# Patient Record
Sex: Male | Born: 1935 | Race: White | Hispanic: No | Marital: Married
Health system: Southern US, Community
[De-identification: ages and names within clinical notes are randomized; demographics above are authoritative.]

## PROBLEM LIST (undated history)

## (undated) ENCOUNTER — Ambulatory Visit: Admission: EM | Payer: Self-pay | Source: Home / Self Care

## (undated) DIAGNOSIS — R42 Dizziness and giddiness: Secondary | ICD-10-CM

## (undated) DIAGNOSIS — C801 Malignant (primary) neoplasm, unspecified: Secondary | ICD-10-CM

## (undated) DIAGNOSIS — I7300 Raynaud's syndrome without gangrene: Secondary | ICD-10-CM

## (undated) DIAGNOSIS — I639 Cerebral infarction, unspecified: Secondary | ICD-10-CM

## (undated) DIAGNOSIS — M79672 Pain in left foot: Secondary | ICD-10-CM

## (undated) DIAGNOSIS — I10 Essential (primary) hypertension: Secondary | ICD-10-CM

## (undated) DIAGNOSIS — N400 Enlarged prostate without lower urinary tract symptoms: Secondary | ICD-10-CM

## (undated) DIAGNOSIS — E7800 Pure hypercholesterolemia, unspecified: Secondary | ICD-10-CM

## (undated) DIAGNOSIS — R011 Cardiac murmur, unspecified: Secondary | ICD-10-CM

## (undated) DIAGNOSIS — M79671 Pain in right foot: Secondary | ICD-10-CM

## (undated) DIAGNOSIS — Z923 Personal history of irradiation: Secondary | ICD-10-CM

## (undated) DIAGNOSIS — J449 Chronic obstructive pulmonary disease, unspecified: Secondary | ICD-10-CM

## (undated) DIAGNOSIS — E559 Vitamin D deficiency, unspecified: Secondary | ICD-10-CM

## (undated) DIAGNOSIS — G250 Essential tremor: Secondary | ICD-10-CM

## (undated) DIAGNOSIS — E039 Hypothyroidism, unspecified: Secondary | ICD-10-CM

## (undated) DIAGNOSIS — I739 Peripheral vascular disease, unspecified: Secondary | ICD-10-CM

## (undated) DIAGNOSIS — Z8673 Personal history of transient ischemic attack (TIA), and cerebral infarction without residual deficits: Secondary | ICD-10-CM

## (undated) DIAGNOSIS — R296 Repeated falls: Secondary | ICD-10-CM

## (undated) DIAGNOSIS — K52831 Collagenous colitis: Secondary | ICD-10-CM

## (undated) HISTORY — PX: TONSILLECTOMY: SUR1361

## (undated) HISTORY — PX: ILIAC ARTERY STENT: SHX1786

## (undated) HISTORY — PX: VASECTOMY: SHX75

## (undated) HISTORY — PX: DIAGNOSTIC LAPAROSCOPY: SUR761

## (undated) HISTORY — PX: HERNIA REPAIR: SHX51

## (undated) HISTORY — PX: CHOLECYSTECTOMY: SHX55

---

## 1989-10-10 HISTORY — PX: EXPLORATORY LAPAROTOMY: SUR591

## 1994-08-12 ENCOUNTER — Ambulatory Visit: Admission: RE | Admit: 1994-08-12 | Payer: Self-pay | Source: Ambulatory Visit | Admitting: Gastroenterology

## 2000-05-26 ENCOUNTER — Ambulatory Visit
Admission: RE | Admit: 2000-05-26 | Disposition: A | Discharge status: Home / Self Care | Payer: Self-pay | Source: Ambulatory Visit | Admitting: Gastroenterology

## 2000-07-09 ENCOUNTER — Emergency Department
Admit: 2000-07-09 | Disposition: A | Discharge status: Home / Self Care | Payer: Self-pay | Admitting: Emergency Medicine

## 2004-12-14 ENCOUNTER — Emergency Department
Admission: EM | Admit: 2004-12-14 | Disposition: A | Discharge status: Home / Self Care | Payer: Self-pay | Source: Emergency Department | Admitting: Emergency Medicine

## 2004-12-16 ENCOUNTER — Ambulatory Visit
Admission: AD | Admit: 2004-12-16 | Disposition: A | Discharge status: Home / Self Care | Payer: Self-pay | Source: Ambulatory Visit | Admitting: Internal Medicine

## 2005-03-30 ENCOUNTER — Ambulatory Visit
Admission: RE | Admit: 2005-03-30 | Disposition: A | Discharge status: Home / Self Care | Payer: Self-pay | Source: Ambulatory Visit | Admitting: Gastroenterology

## 2005-04-25 ENCOUNTER — Ambulatory Visit
Admission: AD | Admit: 2005-04-25 | Disposition: A | Discharge status: Home / Self Care | Payer: Self-pay | Source: Ambulatory Visit | Admitting: Internal Medicine

## 2008-10-10 HISTORY — PX: STENT PLACEMENT ILIAC (ARMC HX): HXRAD1735

## 2008-10-14 ENCOUNTER — Ambulatory Visit
Admission: RE | Admit: 2008-10-14 | Disposition: A | Discharge status: Home / Self Care | Payer: Self-pay | Source: Ambulatory Visit | Admitting: Internal Medicine

## 2008-11-05 ENCOUNTER — Ambulatory Visit
Admission: RE | Admit: 2008-11-05 | Disposition: A | Discharge status: Home / Self Care | Payer: Self-pay | Source: Ambulatory Visit | Admitting: Internal Medicine

## 2008-12-05 ENCOUNTER — Observation Stay
Admission: RE | Admit: 2008-12-05 | Disposition: A | Discharge status: Home / Self Care | Payer: Self-pay | Source: Ambulatory Visit | Admitting: Body Imaging

## 2009-01-07 ENCOUNTER — Ambulatory Visit
Admission: RE | Admit: 2009-01-07 | Disposition: A | Discharge status: Home / Self Care | Payer: Self-pay | Source: Ambulatory Visit | Admitting: Body Imaging

## 2009-08-07 ENCOUNTER — Ambulatory Visit
Admission: RE | Admit: 2009-08-07 | Disposition: A | Discharge status: Home / Self Care | Payer: Self-pay | Source: Ambulatory Visit | Admitting: Body Imaging

## 2010-02-09 ENCOUNTER — Ambulatory Visit
Admission: RE | Admit: 2010-02-09 | Disposition: A | Discharge status: Home / Self Care | Payer: Self-pay | Source: Ambulatory Visit | Admitting: Gastroenterology

## 2010-08-17 ENCOUNTER — Ambulatory Visit
Admission: RE | Admit: 2010-08-17 | Disposition: A | Discharge status: Home / Self Care | Payer: Self-pay | Source: Ambulatory Visit | Admitting: Body Imaging

## 2011-07-13 NOTE — Consults (Signed)
TOWNES, FUHS                                                    MRN:          [id number]                                                          Account:      [id number]                                                     Document ID:  [id number]                                               Service Date: 11/05/2008                                                                                    MRN: [id number]  Admit Date: 11/05/2008     Patient Location: [facility]   Patient Type: O     CONSULTING PHYSICIAN: Katherina Right MUFTI MD     REFERRING PHYSICIAN:         REASON FOR CONSULTATION:   I had the pleasure of meeting your patient in the interventional radiology  office regarding peripheral vascular disease consultation.     HISTORY OF PRESENT ILLNESS:  This is a pleasant 77 year old male who complains of claudication at the  thigh and calf levels bilaterally which occurs at less than 15 minutes of  ambulation.  The patient recently underwent noninvasive arterial studies of  the lower extremities which demonstrated a resting ankle brachial index  0.94 on the right side and 0.62 on the left side.  Significant blunting  waveforms is noted bilaterally.  The iliac vessels were not well seen  initially, however inflow disease was suspected.     The patient returns today for repeat iliac imaging.  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  He moderate to  high-grade stenosis of the right common iliac artery is also noted.     The patient denies numbness or tingling in the lower extremities.  He does  not have rest pain.  He denies nonhealing ulcerations.     PAST MEDICAL HISTORY:  Prior tobacco use.  He quit 3 to 4 years ago.  He states his cholesterol is  well controlled.  He admits to hypertension which is well controlled with  antihypertensives and excessive stress.  There is no history of diabetes.     Other medical  history includes questionable history of heart murmur and  tremor in the hands along with BPH.     PAST SURGICAL HISTORY:  Internal hernia repair in 1991, cholecystectomy in 1992, and tonsillectomy  as a child.     CURRENT MEDICATIONS:                                                                                                           Page 1 of 3  HASHIR, DELEEUW                                                    MRN:          [id number]                                                          Account:      [id number]                                                     Document ID:  [id number]                                               Service Date: 11/05/2008                                                                                    Aspirin 81 mg every day, Diovan, hydrochlorothiazide 160 mg daily.   Atenolol 50 mg every day, primidone 50 mg every day, thyroid medicine and  multivitamins.     ALLERGIES:  No known drug allergies.     FAMILY HISTORY:  Obesity, dementia, emphysema and asthma.      SOCIAL HISTORY:  The patient has a 50-pack-year history of smoking.  He quit 3 to 4 years  ago.  Occasionally drinks alcohol.  He is married and works as a retired  Production designer, theatre/television/film.     REVIEW OF SYSTEMS:  Noncontributory other than urinary hesitancy, which may be due to BPH.     PHYSICAL  EXAMINATION:    VITAL SIGNS: 5 feet 9 inches at 200 pounds.  Vital signs are 136/70 in the  right arm and 138/84 in the left arm.  Heart rate is 68, respiratory rate  is 16.  GENERAL:  Appears well-developed, well nourished, in no acute distress or  pain.  HEENT:  Pupils are round and reactive.  Extraocular motion is normal.  NECK:  Supple, with no carotid bruit.  There is no lymphadenopathy.  CARDIOVASCULAR:  Regular rate and rhythm with no murmurs or gallops.  LUNGS:  Clear to auscultation with no rales, rhonchi, or wheezing.  ABDOMEN:  Soft, nontender, nondistended.  EXTREMITIES:  Demonstrate dopplerable pedal pulses  bilaterally which appear  biphasic.  Nonpalpable popliteal pulses are noted with decreased but  distant palpable pulses at the common femoral level especially at the left  side.  Minimal cyanosis noted at the toe tips bilaterally.  There is no  clubbing seen.  Hair and nail changes are appreciated minimally.     DIAGNOSTICS:  Arterial duplex and noninvasive performed on October 15, 2008.  Resting  ankle brachial index 2.94 on the right side and 0.62 on the left side.   Significant inflow disease suspected by duplex.  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     ASSESSMENT:  This is a pleasant 77 year old gentleman who complains of claudication at  less than 15 blocks of ambulation.  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  On the right side, mild to moderate grade  stenosis is suspected.     In the office today, we had a lengthy discussion regarding the  pathophysiology of arterial insufficiency, and peripheral vascular disease.   We discussed the risk factors associated with plaque formation as well as  the patient's specific findings.     Given the high-grade stenosis noted on the left side, I have recommended  revascularization of the iliac arteries bilaterally with  angioplasty and  stenting.  I notified the patient that this is a minimally invasive  procedure performed as an outpatient.  The risks, benefits, periprocedural  care and outcomes were discussed the patient thoroughly.     PLAN:  The patient states that he does understand that a critical stenosis is  present on the left side.  Furthermore, he understands the risk of left  iliac occlusion.  He would like to treat his claudication; however, his son  is currently ill and would like to provide care for him in the [place].   He states that he will be returning to the [place] area in the  upcoming weeks and will then decide on revascularization at that point.  I  have notified and further risks of leg ischemia and educated him.      OVERALL PLAN:  1.  Revascularization with angioplasty and stenting has been offered to the  patient at the iliac levels especially on the left side.  2.  The patient will let us know when he wishes to proceed.  3.  The patient was thoroughly educated on the peripheral vascular disease  and leg ischemia.     I thank you for the courtesy of your referral and the privilege of  participating in the care of your patient.  Should you have further  questions or concerns, please do not hesitate to call my office at  [phone].              D:  11/05/2008 16:53 PM by [name], MD ([id number])  T:  11/05/2008 21:50 PM by LS        cc:                                                                                                            Page 3 of 3  Authenticated by [name], MD On 11/10/2008 09:27:09 AM

## 2011-07-13 NOTE — Consults (Signed)
Zachary Coleman, Zachary Coleman                                                    MRN:          [id number]                                                          Account:      [id number]                                                     Document ID:  [id number]                                               Service Date: 08/17/2010                                                                                    MRN: [id number]  Document ID: [id number]  Admit Date: 08/17/2010     Patient Location: DISCHARGED 08/17/2010  Patient Type: O     CONSULTING PHYSICIAN: Katherina Right MUFTI MD     REFERRING PHYSICIAN:         HISTORY OF PRESENT ILLNESS:  I had the pleasure of following up with your patient in the interventional  radiology office.     As you may be aware, the patient is status post bilateral iliac artery  angioplasty and stenting performed in early 2010 for claudication purposes.     The patient returns to our office for routine followup at the 1-1/2 year  mark.     PHYSICAL EXAMINATION:   Today, he reports no resumption of claudication symptoms.  He is ambulating  well without difficulty.  In fact, he states that he has taken a part-time  job and is able to fulfill his role with walking and standing for the  majority of the day without any incidents.  He seems to be very happy  status post revascularization.     MEDICATIONS:   He continues to take his baby aspirin a day.  He is also on an  anticholesterol, specifically Lipitor, which is suiting him well.     PLAN:  Our plan will be to follow up with the patient in 1-1/2 years' time.   Repeat noninvasives will be performed as needed.  Incidentally, ABIs are  measured at greater than 1 on today's exam with wide patency of the iliac  stents.     I will keep you updated on the patient's progress as we see him  again in  1-1/2 years' time.     I thank you for the courtesy of your referral and the privilege of  participating in the care of your patient.  Should you have  further  questions or concerns, please do not hesitate to contact my office at                                                                                                           Page 1 of 2  Zachary Coleman, Zachary Coleman                                                    MRN:          [id number]                                                          Account:      [id number]                                                     Document ID:  [id number]                                               Service Date: 08/17/2010                                                                                    [phone].              D:  08/17/2010 14:58 PM by [name], MD ([id number])  T:  08/18/2010 08:25 AM by Netta Cedars      Everlean Cherry: [id number]) (Doc ID: [id number])        cc: Burman Blacksmith MD  Page 2 of 2  Authenticated by [name], MD On 08/19/2010 10:10:36 AM

## 2011-07-13 NOTE — Consults (Signed)
Zachary Coleman, Zachary Coleman                                                    MRN:          [id number]                                                          Account:      [id number]                                                     Document ID:  [id number]                                               Service Date: 08/07/2009                                                                                    MRN: [id number]  Admit Date: 11/05/2008     Patient Location: DISCHARGED 11/05/2008  Patient Type: O     CONSULTING PHYSICIAN: Katherina Right MUFTI MD     REFERRING PHYSICIAN: Samella Parr MD        HISTORY OF PRESENT ILLNESS:  I had the pleasure of following up with this patient  in the interventional  radiology office.     As you may be aware, the patient previously suffered from bilateral  claudication and hemodynamically significant iliacs stenoses.   Approximately 8 months ago, he was brought to [facility] with  angioplasty and stenting of the iliac arteries was performed bilaterally  with kissing balloon and stent technique.     The patient has done remarkably well post procedure.  His claudication  symptoms have completely resolved.  He states that he is able to walk up to  an hour without significant difficulty.     By objective measurements, ABI is measured at 0.99 bilaterally which is  significantly improved in comparison to preprocedure.  Arterial duplex  demonstrates patency of the stents with elevation of velocity seen at the  proximal right stent region.  However, given that this is a covered stent,  this likely represents artifact given that no stenosis is seen within.     The patient continues to take antiplatelets, and has been started on  Lipitor recently.  He states that he smokes only occasionally, [place]  [place].       PLAN:  Our plan will be to follow up with the patient in 1 year's time.  Repeat  noninvasives will  be performed as needed.     I am glad to see that he is  doing very well from a vascular standpoint.     Dr. Selena Batten, I thank you for the courtesy of your prior referral and the  privilege of participating in the care of your patient.  Should you have  further questions or concerns, please do not hesitate to contact my office  at [phone] or [phone].  The patient is planning to follow up with                                                                                                           Page 1 of 2  CHRSTOPHER, MALENFANT                                                    MRN:          [id number]                                                          Account:      [id number]                                                     Document ID:  [id number]                                               Service Date: 08/07/2009                                                                                    your office in a few weeks.              D:  08/07/2009 14:35 PM by [name], MD ([id number])  T:  08/07/2009 16:36 PM by MD        cc: Burman Blacksmith MD  Page 2 of 2  Authenticated by [name], MD On 08/10/2009 08:48:07 AM

## 2011-07-13 NOTE — Consults (Signed)
Zachary Coleman, Zachary Coleman                                                    MRN:          [id number]                                                          Account:      [id number]                                                     Document ID:  [id number]                                               Service Date: 01/07/2009                                                                                    MRN: [id number]  Admit Date: 01/07/2009     Patient Location: [facility]   Patient Type: O     CONSULTING PHYSICIAN: Katherina Right MUFTI MD     REFERRING PHYSICIAN:         HISTORY OF PRESENT ILLNESS:  I had the pleasure of following up with your patient in the interventional  radiology office.     As you may be aware, this is a 77 year old male who complained of bilateral  left greater than right claudication.  The left side was significant enough  that it was causing an alteration of his lifestyle.     Preprocedure ABIs on the left side measured 0.62.  The patient had a  high-grade critical stenosis of his left common iliac artery by duplex.     The patient was taken for angiography approximately 3 weeks ago, at which  point bilateral kissing iliac stent grafts were placed.  These stents were  placed just below the origin of the aorta.     The patient has done very well status post bilateral kissing stents.  He  currently states complete resolution of his claudication symptoms.  He does  have some fatigue noted in the calf levels bilaterally.  This only occurs  after significant walking.  Pulses are palpable bilaterally, which he did  not have previously.     Objectively, the patient's ABI on the left side in the office today  measures 0.88 whereas previously measured at 0.62.     The patient is currently taking Plavix, which is due to run-out in another  week.  I have asked him to discontinue Plavix  at that point, given that  covered stents are in place.  He will continue on aspirin alone afterwards.     The overall plan will  be to follow up with the patient in approximately 6  months time.  A baseline noninvasive exam of his arteries has been ordered  and the results are pending.     I have encouraged the patient to continue ambulating and walking.  This  should help his claudication symptoms as well as prevention of further.  I                                                                                                           Page 1 of 2  [name], [name]                                                    MRN:          [id number]                                                          Account:      [id number]                                                     Document ID:  [id number]                                               Service Date: 01/07/2009                                                                                    have also asked him to follow up with your office regarding routine  cholesterol screening, as well as surveillance of his   hypertension medications.     OVERALL PLAN::  Patient is doing well from a vascular standpoint.  Pulses are palpable.   ABIs significantly improved.  Claudication symptoms have resolved with the  patient complaining of calf fatigue with prolonged walking.  This should  improve as the patient's stamina improves with walking.     I  thank you for the courtesy of your prior referral and the privilege of  participating in the care of your patient.  Should you have further  questions or concerns, please do not hesitate to call my office at  [phone].              D:  01/07/2009 16:02 PM by [name], MD ([id number])  T:  01/07/2009 18:02 PM by MD        cc: Burman Blacksmith MD                                                                                                           Page 2 of 2  Authenticated by [name], MD On 01/20/2009 11:32:58 AM

## 2011-10-11 HISTORY — PX: TRANSURETHRAL RESECTION OF BLADDER TUMOR WITH GYRUS (TURBT-GYRUS): SHX6458

## 2013-10-10 DIAGNOSIS — Z8673 Personal history of transient ischemic attack (TIA), and cerebral infarction without residual deficits: Secondary | ICD-10-CM

## 2013-10-10 HISTORY — DX: Personal history of transient ischemic attack (TIA), and cerebral infarction without residual deficits: Z86.73

## 2013-11-21 ENCOUNTER — Ambulatory Visit: Payer: Self-pay

## 2013-11-21 ENCOUNTER — Other Ambulatory Visit
Admission: RE | Admit: 2013-11-21 | Discharge: 2013-11-21 | Disposition: A | Discharge status: Home / Self Care | Payer: Medicare Other | Source: Ambulatory Visit | Attending: Urology | Admitting: Urology

## 2013-11-21 DIAGNOSIS — C674 Malignant neoplasm of posterior wall of bladder: Secondary | ICD-10-CM

## 2014-01-03 ENCOUNTER — Ambulatory Visit: Payer: Self-pay | Admitting: Urology

## 2014-01-24 ENCOUNTER — Ambulatory Visit: Payer: Self-pay | Admitting: Physician Assistant

## 2014-01-28 ENCOUNTER — Ambulatory Visit: Payer: Self-pay | Admitting: Urology

## 2014-02-10 ENCOUNTER — Ambulatory Visit: Payer: Self-pay | Admitting: Urology

## 2014-02-13 LAB — PATHOLOGY REPORT

## 2014-06-17 ENCOUNTER — Ambulatory Visit: Payer: Self-pay

## 2014-06-17 ENCOUNTER — Emergency Department: Payer: Self-pay | Admitting: Emergency Medicine

## 2014-07-01 ENCOUNTER — Ambulatory Visit: Payer: Self-pay | Admitting: Family Medicine

## 2014-07-24 ENCOUNTER — Ambulatory Visit: Payer: Self-pay | Admitting: Family Medicine

## 2014-08-11 ENCOUNTER — Ambulatory Visit: Payer: Self-pay | Admitting: Specialist

## 2014-09-16 ENCOUNTER — Ambulatory Visit: Payer: Self-pay | Admitting: Neurology

## 2014-10-29 ENCOUNTER — Encounter: Payer: Self-pay | Admitting: Neurology

## 2014-11-10 ENCOUNTER — Encounter: Payer: Self-pay | Admitting: Neurology

## 2014-11-14 ENCOUNTER — Ambulatory Visit: Payer: Self-pay | Admitting: Urology

## 2014-11-14 LAB — CREATININE, SERUM
Creatinine: 1.27 mg/dL (ref 0.60–1.30)
EGFR (African American): >60
EGFR (Non-African Amer.): 58 — ABNORMAL LOW

## 2014-11-19 ENCOUNTER — Ambulatory Visit: Payer: Self-pay | Admitting: Gastroenterology

## 2014-12-02 ENCOUNTER — Ambulatory Visit: Payer: Self-pay | Admitting: Urology

## 2014-12-09 ENCOUNTER — Ambulatory Visit: Payer: Self-pay | Admitting: Urology

## 2014-12-09 ENCOUNTER — Encounter
Admit: 2014-12-09 | Disposition: A | Discharge status: Home / Self Care | Payer: Self-pay | Attending: Neurology | Admitting: Neurology

## 2015-01-19 ENCOUNTER — Ambulatory Visit
Admit: 2015-01-19 | Disposition: A | Discharge status: Home / Self Care | Payer: Self-pay | Attending: Gastroenterology | Admitting: Gastroenterology

## 2015-01-23 ENCOUNTER — Other Ambulatory Visit: Payer: Self-pay | Admitting: Specialist

## 2015-01-23 DIAGNOSIS — R69 Illness, unspecified: Secondary | ICD-10-CM

## 2015-01-31 NOTE — H&P (Signed)
PATIENT NAME:  Eric Torres, Eric Torres MR#:  [id number] DATE OF BIRTH:  18-Jan-1932  DATE OF ADMISSION:  02/10/2014  CHIEF COMPLAINT: History of bladder cancer with atypical urothelial cells present in urine cytology.  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 He received 6 weekly treatments of intravesical BCG. He had negative surveillance cystoscopy in January. He also had negative bladder biopsy at that time. Urine cytology and cystoscopy was performed April 2nd. Cystoscopy indicated a heavily trabeculated bladder but no obvious tumors. Urine cytology revealed atypia. The patient also has a long history of difficulty voiding. He has significant lower urinary tract symptoms in spite of finasteride 5 mg daily and Flomax 0.4 mg daily. He has a history of lateral lobe prostatic hypertrophy. Prostate ultrasound on April 2nd indicated a 25.1 gram prostate. PSA was 2.7 ng/mL on March 25th.   The patient comes in now for bladder biopsy and photovaporization of the prostate with green laser.   ALLERGIES: No drug allergies.   CURRENT MEDICATIONS: Include simvastatin, atenolol, primidone, levothyroxine, finasteride, tamsulosin, vitamin D, and aspirin.   PAST SURGICAL HISTORY: Tonsillectomy in 1938, vasectomy in 1971, exploratory laparotomy for volvulus with bowel obstruction in 1991, cholecystectomy in 1992, bilateral iliac stent placement in 2010, and transurethral resection of bladder cancer in 2013.   SOCIAL HISTORY: The patient quit smoking 2 years ago with a 19 pack-year history. He consumes 1 alcoholic beverage per week.  FAMILY HISTORY: The patient's father died at age 35 of asthma. Mother died of coronary artery disease at age 87.  PAST AND CURRENT MEDICAL CONDITIONS:  1.  Hypertension since 1974.  2.  Hypercholesterolemia since 2010. 3.  Hypothyroidism since 2010. 4.  Benign prostatic hypertrophy  with lower urinary tract symptoms since 2011.  5.  Benign essential tremor since 2011. 6.  History of superficial bladder cancer and carcinoma in situ since 2013.   REVIEW OF SYSTEMS: The patient has chronic constipation. Denied chest pain, shortness of breath, diabetes, or stroke.   PHYSICAL EXAMINATION: GENERAL: Well-nourished white male in no distress.  HEENT: Sclerae were clear. Pupils were equally round and reactive to light and accommodation. Extraocular movements were intact.  NECK: Supple. No palpable cervical adenopathy. No audible carotid bruits.  LUNGS: Clear to auscultation.  CARDIOVASCULAR: Regular rhythm and rate without audible murmurs.  ABDOMEN: Soft, nontender. GENITOURINARY: Uncircumcised with buried penis. Testes are smooth and nontender, 25 mL in size each.  RECTAL: Internal hemorrhoids. Prostate gland 45 grams smooth and nontender.  NEUROMUSCULAR: Alert and oriented x3.   IMPRESSION:  1.  History of carcinoma in situ of the bladder with atypia on cytology.  2.  Benign prostatic hypertrophy with bladder outlet obstruction.   PLAN:  1.  Cystoscopy with bladder biopsy. 2.  Photovaporization of the prostate with green light laser. ____________________________ Otelia Limes. Yves Dill, MD mrw:sb D: 01/30/2014 15:46:10 ET T: 01/30/2014 16:45:23 ET JOB#: [id number]  cc: Otelia Limes. Yves Dill, MD, <Dictator> Royston Cowper MD ELECTRONICALLY SIGNED 02/04/2014 8:24

## 2015-01-31 NOTE — Op Note (Signed)
PATIENT NAME:  Eric Torres, Eric Torres MR#:  [id number] DATE OF BIRTH:  16-Nov-1931  DATE OF PROCEDURE:  02/10/2014  PREOPERATIVE DIAGNOSES:  1. Benign prostatic hypertrophy with bladder outlet obstruction.  2. History of bladder cancer with atypical urine cytology.   POSTOPERATIVE DIAGNOSES:  1. Benign prostatic hypertrophy with bladder outlet obstruction.  2. History of bladder cancer with atypical urine cytology.   PROCEDURES:  1. Photovaporization of prostate with a GreenLight laser. 2. Bladder biopsy.   SURGEON: Maryan Puls, MD  ANESTHETIST: Dr. [place] Service.  ANESTHETIC METHOD: General per Boston Service and local per Yves Dill.   INDICATIONS: See the dictated history and physical. After informed consent, the patient requests the above procedures.   OPERATIVE SUMMARY: After adequate general anesthesia had been obtained, the patient was placed into dorsal lithotomy position and the perineum was prepped and draped in the usual fashion. The 21 French cystoscope was coupled with the camera and then visually advanced into the bladder. The bladder was heavily trabeculated with cellules present. No bladder mucosal lesions were identified. Both ureteral orifices were identified and had clear efflux. The patient had trilobar BPH with visual obstruction. At this point, the cold cup biopsy forceps were introduced through the cystoscope and biopsies were taken from the dome, posterior wall, and both lateral walls and submitted to pathology. Biopsy sites were cauterized with the Bugbee electrode. The cystoscope was then removed. The laser scope was then coupled with the camera and then visually advanced into the bladder. The GreenLight XPS laser fiber was introduced through the scope and vaporization of the bladder neck was performed at a setting of 80 watts. After the bladder neck tissue was vaporized, the power was increased up to 120 watts and remaining obstructive tissue from the bladder neck to the  verumontanum was vaporized. At this point, the scope was removed. Ten milliliters of viscous Xylocaine was instilled within the urethra and the bladder. A 20 French silicone catheter was placed. The catheter was irrigated until clear. A B and O suppository was placed. The procedure was then terminated and the patient was transferred to the recovery room in stable condition.    ____________________________ Otelia Limes. Yves Dill, MD mrw:lt D: 02/10/2014 16:14:39 ET T: 02/11/2014 02:05:35 ET JOB#: [id number]  cc: Otelia Limes. Yves Dill, MD, <Dictator> Royston Cowper MD ELECTRONICALLY SIGNED 02/11/2014 9:09

## 2015-02-02 LAB — SURGICAL PATHOLOGY

## 2015-02-08 NOTE — H&P (Signed)
PATIENT NAME:  Eric Torres MR#:  [id number] DATE OF BIRTH:  1931/11/09  DATE OF ADMISSION:  12/09/2014  CHIEF COMPLAINT: Bladder cancer.   HISTORY OF PRESENT ILLNESS: Mr. 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 He underwent surveillance cystoscopy, January 25, which was negative; however, cytology was positive. This was evaluated with an abdominal pelvic CT scan with and without contrast, and no lesions were identified. He comes in now for cystoscopy with bilateral retrogrades and bladder biopsy.   ALLERGIES: No drug allergies.   CURRENT MEDICATIONS: Include simvastatin, atenolol, primidone, levothyroxine, finasteride, tamsulosin, vitamin D, and aspirin.   PREVIOUS SURGICAL PROCEDURES: Include tonsillectomy in 1938, vasectomy in 1971, exploratory laparotomy for volvulus and bowel obstruction in 1991, cholecystectomy in 1992, bilateral iliac stent placement in 2010, and transurethral resection of bladder cancer in 2013.   SOCIAL HISTORY: The patient quit smoking in 2013, with a 45-pack-year history. He consumes 1 alcoholic beverage per week.   FAMILY HISTORY: Father died at age 57 of asthma. Mother died of coronary artery disease at age 36.   PAST AND CURRENT MEDICAL CONDITIONS: 1. Hypertension since 1974.  2. Hypercholesterolemia since 2010.  3. Hypothyroidism since 2010.  4. Benign prostatic hypertrophy with lower urinary tract symptoms since 2011.  5. Benign essential tremor since 2011.  6. History of superficial bladder cancer and carcinoma in situ since 2013.   REVIEW OF SYSTEMS: The patient has chronic constipation. He denied chest pain, shortness of breath, diabetes, or stroke.   PHYSICAL EXAMINATION:  GENERAL: A well-nourished, white male, in no acute distress.  HEENT: Sclerae were clear. Pupils were equally round, reactive to light and  accommodation. Extraocular movements were intact.  NECK: Supple. No palpable cervical adenopathy. No audible carotid bruits.  LUNGS: Clear to auscultation.   CARDIOVASCULAR: Regular rhythm and rate without audible murmurs.  ABDOMEN: Soft, nontender abdomen.  GENITOURINARY: Uncircumcised with buried penis. Testes were smooth and nontender, 25 mL in size, each.  RECTAL: Internal hemorrhoids. Prostate gland 45 grams, smooth and nontender.  NEUROMUSCULAR: Grossly intact.   IMPRESSION:  1. Positive urine cytology.  2. History of carcinoma in situ and bladder cancer.   PLAN: Cystoscopy with bilateral retrogrades and bladder biopsy.   ____________________________ Eric Torres. Eric Dill, MD mrw:JT D: 12/02/2014 12:01:46 ET T: 12/02/2014 12:24:31 ET JOB#: [id number]  cc: Eric Torres. Eric Dill, MD, <Dictator> Royston Cowper MD ELECTRONICALLY SIGNED 12/02/2014 15:45

## 2015-02-08 NOTE — Op Note (Signed)
PATIENT NAME:  Eric Torres, Eric Torres MR#:  [id number] DATE OF BIRTH:  [birth year]  DATE OF PROCEDURE:  12/09/2014  PREOPERATIVE DIAGNOSIS:  Positive urine cytology and history of carcinoma in situ of the bladder.   POSTOPERATIVE DIAGNOSIS:  Positive urine cytology and history of carcinoma in situ of the bladder.   PROCEDURE:  1.  Cystoscopy with bilateral retrogrades.  2.  Bladder biopsy.  3.  Fluoroscopy.   SURGEON: Otelia Limes. Yves Dill, MD  ANESTHETIST:  Alvin Critchley, MD  ANESTHETIC METHOD: General.   INDICATIONS: See the dictated history and physical. After informed consent, the patient requests the above procedure.   OPERATIVE SUMMARY: After adequate general anesthesia had been obtained, the patient was placed into dorsal lithotomy position and the perineum was prepped and draped in the usual fashion. The 21 French cystoscope was coupled with the camera and then visually advanced into the bladder. The patient had TURP defect. The bladder was heavily trabeculated and bladder neck was fibrotic but patent.  Inspection of the bladder revealed one 2 mm erythematous area on the posterior bladder wall. No other lesions were identified. There were numerous cellules present and small diverticula. Ureteral orifices could not be identified due to significant scarring of the trigone. An ampule of methylene blue was given by the anesthetist. After approximately 5 minutes, blue efflux was seen from the trigone and the orifices were identified. Bilateral retrograde pyelograms were performed using contrast through an 8 [place] cone-tipped ureteral catheter. Static fluoroscopic and post drain films did not reveal any filling defects.  At this point, the cold cup biopsy forceps was delivered through the scope and the erythematous lesion on the posterior lateral wall was biopsied. After the specimen was submitted to pathology, the Bugbee electrode was used to cauterize the base of this biopsy area. At this point, the  bladder was drained and the cystoscope was removed; 10 mL of viscous Xylocaine was instilled within the urethra and the bladder. A B and O suppository was placed. The procedure was then terminated and the patient was transferred to the recovery room in stable condition.   ____________________________ Otelia Limes. Yves Dill, MD mrw:LT D: 12/09/2014 15:24:47 ET T: 12/09/2014 16:36:03 ET JOB#: [id number]  cc: Otelia Limes. Yves Dill, MD, <Dictator> Royston Cowper MD ELECTRONICALLY SIGNED 12/11/2014 9:51

## 2015-05-25 ENCOUNTER — Other Ambulatory Visit: Payer: Self-pay | Admitting: Urology

## 2015-05-25 DIAGNOSIS — C674 Malignant neoplasm of posterior wall of bladder: Secondary | ICD-10-CM

## 2015-05-25 DIAGNOSIS — C61 Malignant neoplasm of prostate: Secondary | ICD-10-CM

## 2015-05-29 ENCOUNTER — Encounter
Admission: RE | Admit: 2015-05-29 | Discharge: 2015-05-29 | Disposition: A | Discharge status: Home / Self Care | Payer: Medicare Other | Source: Ambulatory Visit | Attending: Urology | Admitting: Urology

## 2015-05-29 DIAGNOSIS — C61 Malignant neoplasm of prostate: Secondary | ICD-10-CM | POA: Insufficient documentation

## 2015-05-29 DIAGNOSIS — C674 Malignant neoplasm of posterior wall of bladder: Secondary | ICD-10-CM | POA: Diagnosis present

## 2015-05-29 MED ORDER — TECHNETIUM TC 99M MEDRONATE IV KIT
23.3600 | PACK | Freq: Once | INTRAVENOUS | Status: AC | PRN
  Administered 2015-05-29: 23.36 via INTRAVENOUS

## 2015-06-01 ENCOUNTER — Ambulatory Visit
Admission: RE | Admit: 2015-06-01 | Discharge: 2015-06-01 | Disposition: A | Discharge status: Home / Self Care | Payer: Medicare Other | Source: Ambulatory Visit | Attending: Urology | Admitting: Urology

## 2015-06-01 DIAGNOSIS — C61 Malignant neoplasm of prostate: Secondary | ICD-10-CM

## 2015-06-01 DIAGNOSIS — K4020 Bilateral inguinal hernia, without obstruction or gangrene, not specified as recurrent: Secondary | ICD-10-CM | POA: Insufficient documentation

## 2015-06-01 DIAGNOSIS — Z8551 Personal history of malignant neoplasm of bladder: Secondary | ICD-10-CM | POA: Insufficient documentation

## 2015-06-01 DIAGNOSIS — Z8546 Personal history of malignant neoplasm of prostate: Secondary | ICD-10-CM | POA: Diagnosis present

## 2015-06-01 DIAGNOSIS — C674 Malignant neoplasm of posterior wall of bladder: Secondary | ICD-10-CM

## 2015-06-01 HISTORY — DX: Malignant (primary) neoplasm, unspecified: C80.1

## 2015-06-01 HISTORY — DX: Essential (primary) hypertension: I10

## 2015-06-01 MED ORDER — IOHEXOL 300 MG/ML  SOLN
125.0000 mL | Freq: Once | INTRAMUSCULAR | Status: AC | PRN
  Administered 2015-06-01: 125 mL via INTRAVENOUS

## 2015-08-03 ENCOUNTER — Other Ambulatory Visit: Payer: Self-pay | Admitting: Specialist

## 2015-08-03 DIAGNOSIS — R911 Solitary pulmonary nodule: Secondary | ICD-10-CM

## 2015-08-07 ENCOUNTER — Ambulatory Visit
Admission: RE | Admit: 2015-08-07 | Discharge: 2015-08-07 | Disposition: A | Discharge status: Home / Self Care | Payer: Medicare Other | Source: Ambulatory Visit | Attending: Specialist | Admitting: Specialist

## 2015-08-07 DIAGNOSIS — I7090 Unspecified atherosclerosis: Secondary | ICD-10-CM | POA: Diagnosis not present

## 2015-08-07 DIAGNOSIS — Z09 Encounter for follow-up examination after completed treatment for conditions other than malignant neoplasm: Secondary | ICD-10-CM | POA: Diagnosis present

## 2015-08-07 DIAGNOSIS — J439 Emphysema, unspecified: Secondary | ICD-10-CM | POA: Insufficient documentation

## 2015-08-07 DIAGNOSIS — R911 Solitary pulmonary nodule: Secondary | ICD-10-CM | POA: Insufficient documentation

## 2015-12-23 ENCOUNTER — Inpatient Hospital Stay: Admission: RE | Admit: 2015-12-23 | Payer: Medicare Other | Source: Ambulatory Visit

## 2015-12-23 ENCOUNTER — Encounter: Payer: Self-pay | Admitting: *Deleted

## 2015-12-23 NOTE — Patient Instructions (Signed)
  Your procedure is scheduled on: 12-29-15 (TUESDAY) Report to [place]. To find out your arrival time please call [phone] between 1PM - 3PM on 12-28-15 [facility])  Remember: Instructions that are not followed completely may result in serious medical risk, up to and including death, or upon the discretion of your surgeon and anesthesiologist your surgery may need to be rescheduled.    _X___ 1. Do not eat food or drink liquids after midnight. No gum chewing or hard candies.     _X___ 2. No Alcohol for 24 hours before or after surgery.   ____ 3. Bring all medications with you on the day of surgery if instructed.    _X___ 4. Notify your doctor if there is any change in your medical condition     (cold, fever, infections).     Do not wear jewelry, make-up, hairpins, clips or nail polish.  Do not wear lotions, powders, or perfumes. You may wear deodorant.  Do not shave 48 hours prior to surgery. Men may shave face and neck.  Do not bring valuables to the hospital.    [facility] is not responsible for any belongings or valuables.               Contacts, dentures or bridgework may not be worn into surgery.  Leave your suitcase in the car. After surgery it may be brought to your room.  For patients admitted to the hospital, discharge time is determined by your treatment team.   Patients discharged the day of surgery will not be allowed to drive home.   Please read over the following fact sheets that you were given:      _X___ Take these medicines the morning of surgery with A SIP OF WATER:    1. ATENOLOL  2. PRIMIDONE  3. GABAPENTIN  4.  5.  6.  ____ Fleet Enema (as directed)   ____ Use CHG Soap as directed  ____ Use inhalers on the day of surgery  ____ Stop metformin 2 days prior to surgery    ____ Take 1/2 of usual insulin dose the night before surgery and none on the morning of surgery.   ____ Stop Coumadin/Plavix/aspirin-PT STOPPED  ASPIRIN ON 12-22-15   _X___ Stop Anti-inflammatories-STOP MELOXICAM NOW-NO NSAIDS OR ASPIRIN PRODUCTS-TYLENOL OK TO TAKE   ____ Stop supplements until after surgery.    ____ Bring C-Pap to the hospital.

## 2015-12-23 NOTE — H&P (Signed)
NAME:  Eric Torres, Eric Torres NO.:  [id number]  MEDICAL RECORD NO.:  [id number]  LOCATION:                                 FACILITY:  PHYSICIAN:  Maryan Puls          DATE OF BIRTH:  1932/01/30  DATE OF ADMISSION:  12/29/2015 DATE OF DISCHARGE:                            HISTORY AND PHYSICAL   SAME-DAY SURGERY:  December 29, 2015.  CHIEF COMPLAINT:  Possible bladder cancer.  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  Recently, he has had positive urine cytologies and on cystoscopy December 18, 2015, had an erythematous area in the posterior bladder wall consistent with possible CIS.  CT evaluation indicated no tumor in the upper urinary system.  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  The patient comes in now for cystoscopy with bladder biopsy and possible bladder tumor resection.  ALLERGIES:  NO DRUG ALLERGIES.  CURRENT MEDICATIONS: 1. Atorvastatin. 2. Atenolol. 3. Primidone. 4. Levothyroxine. 5. Gabapentin. 6. Tamsulosin. 7. Aspirin. 8. Vitamin D.  PREVIOUS SURGICAL PROCEDURES: 1. Tonsillectomy in 1938. 2. Vasectomy in [place]. 3. Exploratory laparotomy for volvulus and bowel obstruction in 1991. 4. Cholecystectomy in 1992. 5. Bilateral iliac stent placement in 2010. 6. Transurethral resection of bladder tumor in 2013.  SOCIAL HISTORY:  The patient quit smoking in 2013 with a 45-pack year history.  He consumes 1 alcoholic beverage per week.  FAMILY HISTORY:  Father died at age 24 of asthma.  Mother died of coronary artery disease at age 9.  PAST AND CURRENT MEDICAL CONDITIONS: 1. Hypertension since 1974. 2. Hypercholesterolemia since 2010. 3. Hypothyroidism since 2010. 4. BPH with lower  urinary tract symptoms since 2011. 5. Benign essential tremor since 2011. 6. History of superficial bladder cancer and CIS since 2013. 7. History of bladder cancer since 2016, status post I-125     brachytherapy.  REVIEW OF SYSTEMS:  Patient has chronic constipation.  Denied chest pain, shortness of breath, diabetes, or stroke.  PHYSICAL EXAMINATION:  GENERAL:  Well-nourished white male, in no acute distress. HEENT:  Sclerae were clear.  Pupils were equally round, reactive to light and accommodation.  Extraocular movements were intact. NECK:  Supple.  No palpable cervical adenopathy. LUNGS:  Clear to auscultation. CARDIOVASCULAR:  Regular rhythm and rate without audible murmurs. ABDOMEN:  Soft and nontender abdomen. GU:  Uncircumcised with buried penis.  Testes were smooth and nontender. RECTAL:  Internal hemorrhoids.  Prostate gland was flat, smooth, nontender, approximately 35 mL in size.  NEUROMUSCULAR:  Alert and oriented x3.  IMPRESSION: 1. Positive urine cytology with history of CIS. 2. Abnormal region of posterior bladder wall on cystoscopy.  PLAN:  Cystoscopy, bladder biopsy, and possible bladder tumor resection.    ______________________________ Maryan Puls MD     MW/MEDQ  D:  12/23/2015  T:  12/23/2015  Job:  [id number]

## 2015-12-24 ENCOUNTER — Encounter
Admission: RE | Admit: 2015-12-24 | Discharge: 2015-12-24 | Disposition: A | Discharge status: Home / Self Care | Payer: Medicare Other | Source: Ambulatory Visit | Attending: Urology | Admitting: Urology

## 2015-12-24 DIAGNOSIS — C679 Malignant neoplasm of bladder, unspecified: Secondary | ICD-10-CM | POA: Insufficient documentation

## 2015-12-24 DIAGNOSIS — Z01812 Encounter for preprocedural laboratory examination: Secondary | ICD-10-CM | POA: Diagnosis present

## 2015-12-24 LAB — CBC
HCT: 44.2 % (ref 40.0–52.0)
HEMOGLOBIN: 15.2 g/dL (ref 13.0–18.0)
MCH: 33.2 pg (ref 26.0–34.0)
MCHC: 34.3 g/dL (ref 32.0–36.0)
MCV: 96.9 fL (ref 80.0–100.0)
PLATELETS: 204 10*3/uL (ref 150–440)
RBC: 4.56 MIL/uL (ref 4.40–5.90)
RDW: 13.8 % (ref 11.5–14.5)
WBC: 10.9 10*3/uL — ABNORMAL HIGH (ref 3.8–10.6)

## 2015-12-24 LAB — COMPREHENSIVE METABOLIC PANEL
ALBUMIN: 3.3 g/dL — AB (ref 3.5–5.0)
ALK PHOS: 77 U/L (ref 38–126)
ALT: 35 U/L (ref 17–63)
ANION GAP: 8 (ref 5–15)
AST: 41 U/L (ref 15–41)
BUN: 23 mg/dL — ABNORMAL HIGH (ref 6–20)
CALCIUM: 8.5 mg/dL — AB (ref 8.9–10.3)
CHLORIDE: 100 mmol/L — AB (ref 101–111)
CO2: 27 mmol/L (ref 22–32)
CREATININE: 1.26 mg/dL — AB (ref 0.61–1.24)
GFR calc non Af Amer: 52 mL/min — ABNORMAL LOW (ref >60)
GFR, EST AFRICAN AMERICAN: 60 mL/min — AB (ref >60)
GLUCOSE: 70 mg/dL (ref 65–99)
Potassium: 4.3 mmol/L (ref 3.5–5.1)
SODIUM: 135 mmol/L (ref 135–145)
Total Bilirubin: 0.7 mg/dL (ref 0.3–1.2)
Total Protein: 6.6 g/dL (ref 6.5–8.1)

## 2015-12-29 ENCOUNTER — Encounter: Payer: Self-pay | Admitting: *Deleted

## 2015-12-29 ENCOUNTER — Encounter
Admission: RE | Disposition: A | Discharge status: Home / Self Care | Payer: Self-pay | Source: Ambulatory Visit | Attending: Urology

## 2015-12-29 ENCOUNTER — Ambulatory Visit: Payer: Medicare Other | Admitting: Anesthesiology

## 2015-12-29 ENCOUNTER — Ambulatory Visit
Admission: RE | Admit: 2015-12-29 | Discharge: 2015-12-29 | Disposition: A | Discharge status: Home / Self Care | Payer: Medicare Other | Source: Ambulatory Visit | Attending: Urology | Admitting: Urology

## 2015-12-29 DIAGNOSIS — Z79899 Other long term (current) drug therapy: Secondary | ICD-10-CM | POA: Diagnosis not present

## 2015-12-29 DIAGNOSIS — I10 Essential (primary) hypertension: Secondary | ICD-10-CM | POA: Diagnosis not present

## 2015-12-29 DIAGNOSIS — Z8551 Personal history of malignant neoplasm of bladder: Secondary | ICD-10-CM | POA: Diagnosis not present

## 2015-12-29 DIAGNOSIS — N400 Enlarged prostate without lower urinary tract symptoms: Secondary | ICD-10-CM | POA: Diagnosis not present

## 2015-12-29 DIAGNOSIS — Z87891 Personal history of nicotine dependence: Secondary | ICD-10-CM | POA: Insufficient documentation

## 2015-12-29 DIAGNOSIS — E039 Hypothyroidism, unspecified: Secondary | ICD-10-CM | POA: Insufficient documentation

## 2015-12-29 DIAGNOSIS — D090 Carcinoma in situ of bladder: Secondary | ICD-10-CM | POA: Diagnosis not present

## 2015-12-29 DIAGNOSIS — Z8546 Personal history of malignant neoplasm of prostate: Secondary | ICD-10-CM | POA: Insufficient documentation

## 2015-12-29 DIAGNOSIS — Z7982 Long term (current) use of aspirin: Secondary | ICD-10-CM | POA: Diagnosis not present

## 2015-12-29 DIAGNOSIS — R896 Abnormal cytological findings in specimens from other organs, systems and tissues: Secondary | ICD-10-CM | POA: Diagnosis present

## 2015-12-29 DIAGNOSIS — J449 Chronic obstructive pulmonary disease, unspecified: Secondary | ICD-10-CM | POA: Insufficient documentation

## 2015-12-29 HISTORY — DX: Hypothyroidism, unspecified: E03.9

## 2015-12-29 HISTORY — DX: Essential tremor: G25.0

## 2015-12-29 HISTORY — PX: TRANSURETHRAL RESECTION OF BLADDER TUMOR: SHX2575

## 2015-12-29 HISTORY — DX: Personal history of irradiation: Z92.3

## 2015-12-29 HISTORY — PX: CYSTOSCOPY WITH BIOPSY: SHX5122

## 2015-12-29 HISTORY — DX: Benign prostatic hyperplasia without lower urinary tract symptoms: N40.0

## 2015-12-29 HISTORY — DX: Cardiac murmur, unspecified: R01.1

## 2015-12-29 HISTORY — DX: Pure hypercholesterolemia, unspecified: E78.00

## 2015-12-29 HISTORY — DX: Chronic obstructive pulmonary disease, unspecified: J44.9

## 2015-12-29 HISTORY — DX: Personal history of transient ischemic attack (TIA), and cerebral infarction without residual deficits: Z86.73

## 2015-12-29 SURGERY — CYSTOSCOPY, WITH BIOPSY
Anesthesia: General | Wound class: Clean Contaminated

## 2015-12-29 MED ORDER — LIDOCAINE HCL 2 % EX GEL
CUTANEOUS | Status: AC
  Filled 2015-12-29: qty 10

## 2015-12-29 MED ORDER — FENTANYL CITRATE (PF) 100 MCG/2ML IJ SOLN: 25.0000 ug | INTRAMUSCULAR | Status: DC | PRN

## 2015-12-29 MED ORDER — CEFAZOLIN SODIUM 1-5 GM-% IV SOLN
1.0000 g | Freq: Once | INTRAVENOUS | Status: AC
  Administered 2015-12-29: 1 g via INTRAVENOUS

## 2015-12-29 MED ORDER — FAMOTIDINE 20 MG PO TABS
20.0000 mg | ORAL_TABLET | Freq: Once | ORAL | Status: AC
  Administered 2015-12-29: 20 mg via ORAL

## 2015-12-29 MED ORDER — FAMOTIDINE 20 MG PO TABS
ORAL_TABLET | ORAL | Status: AC
  Administered 2015-12-29: 20 mg via ORAL
  Filled 2015-12-29: qty 1

## 2015-12-29 MED ORDER — BELLADONNA ALKALOIDS-OPIUM 16.2-60 MG RE SUPP
RECTAL | Status: DC | PRN
  Administered 2015-12-29: 1 via RECTAL

## 2015-12-29 MED ORDER — EPHEDRINE SULFATE 50 MG/ML IJ SOLN
INTRAMUSCULAR | Status: DC | PRN
  Administered 2015-12-29: 10 mg via INTRAVENOUS

## 2015-12-29 MED ORDER — LIDOCAINE HCL 2 % EX GEL
CUTANEOUS | Status: DC | PRN
  Administered 2015-12-29: 1

## 2015-12-29 MED ORDER — PROPOFOL 10 MG/ML IV BOLUS
INTRAVENOUS | Status: DC | PRN
Start: 2015-12-29 — End: 2015-12-29
  Administered 2015-12-29: 140 mg via INTRAVENOUS

## 2015-12-29 MED ORDER — MITOMYCIN CHEMO FOR BLADDER INSTILLATION 40 MG
40.0000 mg | Freq: Once | INTRAVENOUS | Status: AC
  Administered 2015-12-29: 40 mg via INTRAVESICAL
  Filled 2015-12-29: qty 40

## 2015-12-29 MED ORDER — LIDOCAINE HCL (CARDIAC) 20 MG/ML IV SOLN
INTRAVENOUS | Status: DC | PRN
Start: 2015-12-29 — End: 2015-12-29
  Administered 2015-12-29: 60 mg via INTRAVENOUS

## 2015-12-29 MED ORDER — ONDANSETRON HCL 4 MG/2ML IJ SOLN
INTRAMUSCULAR | Status: DC | PRN
  Administered 2015-12-29: 4 mg via INTRAVENOUS

## 2015-12-29 MED ORDER — CEFAZOLIN SODIUM 1-5 GM-% IV SOLN
INTRAVENOUS | Status: AC
  Filled 2015-12-29: qty 50

## 2015-12-29 MED ORDER — LACTATED RINGERS IV SOLN
INTRAVENOUS | Status: DC
  Administered 2015-12-29: 13:00:00 via INTRAVENOUS

## 2015-12-29 MED ORDER — UROGESIC-BLUE 81.6 MG PO TABS: 1.0000 | ORAL_TABLET | Freq: Four times a day (QID) | ORAL | Status: DC | PRN

## 2015-12-29 MED ORDER — ONDANSETRON HCL 4 MG/2ML IJ SOLN: 4.0000 mg | Freq: Once | INTRAMUSCULAR | Status: DC | PRN

## 2015-12-29 MED ORDER — MIDAZOLAM HCL 5 MG/5ML IJ SOLN
INTRAMUSCULAR | Status: DC | PRN
  Administered 2015-12-29: 1 mg via INTRAVENOUS

## 2015-12-29 MED ORDER — BELLADONNA ALKALOIDS-OPIUM 16.2-60 MG RE SUPP
RECTAL | Status: AC
  Filled 2015-12-29: qty 1

## 2015-12-29 MED ORDER — FENTANYL CITRATE (PF) 100 MCG/2ML IJ SOLN
INTRAMUSCULAR | Status: DC | PRN
  Administered 2015-12-29: 25 ug via INTRAVENOUS

## 2015-12-29 SURGICAL SUPPLY — 30 items
BAG DRAIN CYSTO-URO LG1000N (MISCELLANEOUS) ×3 IMPLANT
BAG URO DRAIN 2000ML W/SPOUT (MISCELLANEOUS) ×3 IMPLANT
CATH FOLEY 2WAY  5CC 20FR SIL (CATHETERS) ×2
CATH FOLEY 2WAY 5CC 20FR SIL (CATHETERS) ×1 IMPLANT
DRESSING TELFA 4X3 1S ST N-ADH (GAUZE/BANDAGES/DRESSINGS) ×3 IMPLANT
ELECT COAG MONO 22-24F ROLLER (MISCELLANEOUS) ×3
ELECT LOOP 22F BIPOLAR SML (ELECTROSURGICAL) ×3
ELECT REM PT RETURN 9FT ADLT (ELECTROSURGICAL) ×3
ELECTRODE COAG MONO 22-24F RLR (MISCELLANEOUS) ×1 IMPLANT
ELECTRODE LOOP 22F BIPOLAR SML (ELECTROSURGICAL) ×1 IMPLANT
ELECTRODE REM PT RTRN 9FT ADLT (ELECTROSURGICAL) ×1 IMPLANT
GLOVE BIO SURGEON STRL SZ7 (GLOVE) ×6 IMPLANT
GLOVE BIO SURGEON STRL SZ7.5 (GLOVE) ×3 IMPLANT
GOWN STRL REUS W/ TWL LRG LVL3 (GOWN DISPOSABLE) ×1 IMPLANT
GOWN STRL REUS W/ TWL LRG LVL4 (GOWN DISPOSABLE) ×1 IMPLANT
GOWN STRL REUS W/ TWL XL LVL3 (GOWN DISPOSABLE) ×1 IMPLANT
GOWN STRL REUS W/TWL LRG LVL3 (GOWN DISPOSABLE) ×2
GOWN STRL REUS W/TWL LRG LVL4 (GOWN DISPOSABLE) ×2
GOWN STRL REUS W/TWL XL LVL3 (GOWN DISPOSABLE) ×2
KIT RM TURNOVER CYSTO AR (KITS) ×3 IMPLANT
LOOP CUT BIPOLAR 24F LRG (ELECTROSURGICAL) IMPLANT
NDL SAFETY 22GX1.5 (NEEDLE) ×3 IMPLANT
PACK CYSTO AR (MISCELLANEOUS) ×3 IMPLANT
PREP PVP WINGED SPONGE (MISCELLANEOUS) ×3 IMPLANT
SET IRRIG Y TYPE TUR BLADDER L (SET/KITS/TRAYS/PACK) ×3 IMPLANT
SOL PREP PVP 2OZ (MISCELLANEOUS) ×3
SOLUTION PREP PVP 2OZ (MISCELLANEOUS) ×1 IMPLANT
SYRINGE IRR TOOMEY STRL 70CC (SYRINGE) ×3 IMPLANT
WATER STERILE IRR 1000ML POUR (IV SOLUTION) ×3 IMPLANT
WATER STERILE IRR 3000ML UROMA (IV SOLUTION) ×6 IMPLANT

## 2015-12-29 NOTE — Transfer of Care (Signed)
Immediate Anesthesia Transfer of Care Note  Patient: Eric Torres  Procedure(s) Performed: Procedure(s): CYSTOSCOPY WITH BIOPSY (N/A) TRANSURETHRAL RESECTION OF BLADDER TUMOR (TURBT)/MITOMYCIN INSTILLATION (N/A)  Patient Location: PACU  Anesthesia Type:General  Level of Consciousness: sedated  Airway & Oxygen Therapy: Patient Spontanous Breathing and Patient connected to face mask oxygen  Post-op Assessment: Report given to RN  Post vital signs: Reviewed and stable  Last Vitals:  Filed Vitals:   12/29/15 1216 12/29/15 1359  BP: 123/65 123/61  Pulse: 63 58  Temp: 35.2 C 67F  Resp: 16 14    Complications: No apparent anesthesia complications

## 2015-12-29 NOTE — Discharge Instructions (Signed)
Transurethral Resection, Bladder Tumor A cancerous growth (tumor) can develop on the inside wall of the bladder. The bladder is the organ that holds urine. One way to remove the tumor is a procedure called a transurethral resection. The tumor is removed (resected) through the tube that carries urine from the bladder out of the body (urethra). No cuts (incisions) are made in the skin. Instead, the procedure is done through a thin telescope, called a resectoscope. Attached to it is a light and usually a tiny camera. The resectoscope is put into the urethra. In men, the urethra opens at the end of the penis. In women, it opens just above the vagina.  A transurethral resection is usually used to remove tumors that have not gotten too big or too deep. These are called Stage 0, Stage 1 or Stage 2 bladder cancers. LET YOUR CAREGIVER KNOW ABOUT:  On the day of the procedure, your caregivers will need to know the last time you had anything to eat or drink. This includes water, gum, and candy. In advance, make sure they know about:   Any allergies.  All medications you are taking, including:  Herbs, eyedrops, over-the-counter medications and creams.  Blood thinners (anticoagulants), aspirin or other drugs that could affect blood clotting.  Use of steroids (by mouth or as creams).  Previous problems with anesthetics, including local anesthetics.  Possibility of pregnancy, if this applies.  Any history of blood clots.  Any history of bleeding or other blood problems.  Previous surgery.  Smoking history.  Any recent symptoms of colds or infections.  Other health problems. RISKS AND COMPLICATIONS This is usually a safe procedure. Every procedure has risks, though. For a transurethral resection, they include:  Infection. Antibiotic medication would need to be taken.  Bleeding.  Light bleeding may last for several days after the procedure.  If bleeding continues or is heavy, the bladder may  need rinsing. Or, a new catheter might be put in for awhile.  Sometimes bed rest is needed.  Urination problems.  Pain and burning can occur when urinating. This usually goes away in a few days.  Scarring from the procedure can block the flow of urine.  Bladder damage.  It can be punctured or torn during removal of the tumor. If this happens, a catheter might be needed for longer. Antibiotics would be taken while the bladder heals.  Urine can leak through the hole or tear into the abdomen. If this happens, surgery may be needed to repair the bladder. BEFORE THE PROCEDURE   A medical evaluation will be done. This may include:  A physical examination.  Urine test. This is to make sure you do not have a urinary tract infection.  Blood tests.  A test that checks the heart's rhythm (electrocardiogram).  Talking with an anesthesiologist. This is the person who will be in charge of the medication (anesthesia) to keep you from feeling pain during the transurethral resection. You might be asleep during the procedure (general anesthesia) or numb from the waist down, but awake during the procedure (spinal anesthesia). Ask your surgeon what to expect.  The person who is having a transurethral resection needs to give what is called informed consent. This requires signing a legal paper that gives permission for the procedure. To give informed consent:  You must understand how the procedure is done and why.  You must be told all the risks and benefits of the procedure.  You must sign the consent. Sometimes a legal guardian  can do this.  Signing should be witnessed by a healthcare professional.  The day before the surgery, eat only a light dinner. Then, do not eat or drink anything for at least 8 hours before the surgery. Ask your caregiver if it is OK to take any needed medicines with a sip of water.  Arrive at least an hour before the surgery or whenever your surgeon recommends. This will  give you time to check in and fill out any needed paperwork. PROCEDURE  The preparation:  You will change into a hospital gown.  A needle will be inserted in your arm. This is an intravenous access tube (IV). Medication will be able to flow directly into your body through this needle.  Small monitors will be put on your body. They are used to check your heart, blood pressure, and oxygen level.  You might be given medication that will help you relax (sedative).  You will be given a general anesthetic or spinal anesthesia.  The procedure:  Once you are asleep or numb from the waist down, your legs will be placed in stirrups.  The resectoscope will be passed through the urethra into the bladder.  Fluid will be passed through the resectoscope. This will fill the bladder with water.  The surgeon will examine the bladder through the scope. If the scope has a camera, it can take pictures from inside the bladder. They can be projected onto a TV screen.  The surgeon will use various tools to remove the tumor in small pieces. Sometimes a laser (a beam of light energy) is used. Other tools may use electric current.  A tube (catheter) will often be placed so that urine can drain into a bag outside the body. This process helps stop bleeding. This tube keeps blood clots from blocking the urethra.  The procedure usually takes 30 to 45 minutes. AFTER THE PROCEDURE   You will stay in a recovery area until the anesthesia has worn off. Your blood pressure and pulse will be checked every so often. Then you will be taken to a hospital room.  You may continue to get fluids through the IV for awhile.  Some pain is normal. The catheter might be uncomfortable. Pain is usually not severe. If it is, ask for pain medicine.  Your urine may look bloody after a transurethral resection. This is normal.  If bleeding is heavy, a hospital caregiver may rinse out the bladder (irrigation) through the  catheter.  Once the urine is clear, the catheter will be taken out.  You will need to stay in the hospital until you can urinate on your own.  Most people stay in the hospital for up to 4 days. PROGNOSIS   Transurethral resection is considered the best way to treat bladder tumors that are not too far along. For most people, the treatment is successful. Sometimes, though, more treatment is needed.  Bladder cancers can come back even after a successful procedure. Because of this, be sure to have a checkup with your caregiver every 3 to 6 months. If everything is OK for 3 years, you can reduce the checkups to once a year.   This information is not intended to replace advice given to you by your health care provider. Make sure you discuss any questions you have with your health care provider.   Document Released: 07/23/2009 Document Revised: 12/19/2011 Document Reviewed: 09/28/2009 Elsevier Interactive Patient Education 2016 [employer] Bladder Cancer Bladder cancer is an abnormal growth of tissue  in your bladder. Your bladder is the balloon-like sac in your pelvis. It collects and stores urine that comes from the kidneys through the ureters. The bladder wall is made of layers. If cancer spreads into these layers and through the wall of the bladder, it becomes more difficult to treat.  There are four stages of bladder cancer:  Stage I. Cancer at this stage occurs in the bladder's inner lining but has not invaded the muscular bladder wall.  Stage II. At this stage, cancer has invaded the bladder wall but is still confined to the bladder.  Stage III. By this stage, the cancer cells have spread through the bladder wall to surrounding tissue. They may also have spread to the prostate in men or the uterus or vagina in women.  Stage IV. By this stage, cancer cells may have spread to the lymph nodes and other organs, such as your lungs, bones, or liver. RISK FACTORS Although the cause of bladder  cancer is not known, the following risk factors can increase your chances of getting bladder cancer:   Smoking.   Occupational exposures, such as rubber, leather, textile, dyes, chemicals, and paint.  Being white.  Age.   Being male.   Having chronic bladder inflammation.   Having a bladder cancer history.   Having a family history of bladder cancer (heredity).   Having had chemotherapy or radiation therapy to the pelvis.   Being exposed to arsenic.  SYMPTOMS   Blood in the urine.   Pain with urination.   Frequent bladder or urine infections.  Increase in urgency and frequency of urination. DIAGNOSIS  Your health care provider may suspect bladder cancer based on your description of urinary symptoms or based on the finding of blood or infection in the urine (especially if this has recurred several times). Other tests or procedures that may be performed include:   A narrow tube being inserted into your bladder through your urethra (cystoscopy) in order to view the lining of your bladder for tumors.   A biopsy to sample the tumor to see if cancer is present.  If cancer is present, it will then be staged to determine its severity and extent. It is important to know how deeply into the bladder wall the cancer has grown and whether the cancer has spread to any other parts of your body. Staging may require blood tests or special scans such as a CT scan, MRI, bone scan, or chest X-ray.  TREATMENT  Once your cancer has been diagnosed and staged, you should discuss a treatment plan with your health care provider. Based on the stage of the cancer, one treatment or a combination of treatments may be recommended. The most common forms of treatment are:   Surgery. Procedures that may be done include transurethral resection and cystectomy.  Radiation therapy. This is infrequently used to treat bladder cancer.   Chemotherapy. During this treatment, drugs are used to kill  cancer cells.  Immunotherapy. This is usually administered directly into the bladder. HOME CARE INSTRUCTIONS  Take medicines only as directed by your health care provider.   Maintain a healthy diet.   Consider joining a support group. This may help you learn to cope with the stress of having bladder cancer.   Seek advice to help you manage treatment side effects.   Keep all follow-up visits as directed by your health care provider.   Inform your cancer specialist if you are admitted to the hospital.  [place] IF:  There is blood in your urine.  You have symptoms of a urinary tract infection. These include:  Tiredness.  Shakiness.  Weakness.  Muscle aches.  Abdominal pain.  Frequent and intense urge to urinate (in young women).  Burning feeling in the bladder or urethra during urination (in young women). SEEK IMMEDIATE MEDICAL CARE IF:  You are unable to urinate.   This information is not intended to replace advice given to you by your health care provider. Make sure you discuss any questions you have with your health care provider.   Document Released: 09/29/2003 Document Revised: 10/17/2014 Document Reviewed: 03/19/2013 Elsevier Interactive Patient Education 2016 [place]   1) The drugs that you were given will stay in your system until tomorrow so for the next 24 hours you should not:  A) Drive an automobile B) Make any legal decisions C) Drink any alcoholic beverage   2) You may resume regular meals tomorrow.  Today it is better to start with liquids and gradually work up to solid foods.  You may eat anything you prefer, but it is better to start with liquids, then soup and crackers, and gradually work up to solid foods.   3) Please notify your doctor immediately if you have any unusual bleeding, trouble breathing, redness and pain at the surgery site, drainage, fever, or pain not relieved  by medication.    4) Additional Instructions:   Please contact your physician with any problems or Same Day Surgery at [phone], Monday through Friday 6 am to 4 pm, or [place] at [facility] number at [phone].

## 2015-12-29 NOTE — Anesthesia Procedure Notes (Signed)
Procedure Name: LMA Insertion Date/Time: 12/29/2015 1:08 PM Performed by: Dionne Bucy Pre-anesthesia Checklist: Patient identified, Patient being monitored, Timeout performed, Emergency Drugs available and Suction available Patient Re-evaluated:Patient Re-evaluated prior to inductionOxygen Delivery Method: Circle system utilized Preoxygenation: Pre-oxygenation with 100% oxygen Intubation Type: IV induction Ventilation: Mask ventilation without difficulty LMA: LMA inserted LMA Size: 4.5 Tube type: Oral Number of attempts: 1 Placement Confirmation: positive ETCO2 and breath sounds checked- equal and bilateral Tube secured with: Tape Dental Injury: Teeth and Oropharynx as per pre-operative assessment

## 2015-12-29 NOTE — Op Note (Signed)
Preoperative diagnosis: Bladder cancer Postoperative diagnosis: Same  Procedure: 1. Transurethral section of bladder tumor and fulguration                      2. Bladder biopsy                        3. Foley catheter placement  Surgeon: Otelia Limes. Yves Dill MD, FACS Anesthesia: Gen.  Indications:See the history and physical. After informed consent the above procedure(s) were requested     Technique and findings: After adequate general anesthesia had been obtained the 21 [place] the scope was coupled to the camera and visually advanced into the bladder. Bladder was moderately trabeculated. Both ureteral orifices were identified and had clear reflux. Patient had a 1 x 1 cm erythematous patch of the posterior bladder wall. The's area was biopsied with the cold cup biopsy forceps. The base of this area and surrounding area was cauterized. Patient also had a real papillary tumors measuring 10 x 10 mm in aggregate located within a small left lateral wall diverticulum. The bladder appeared to be quite thin in this location. Therefore these tumors were cauterized and surrounding area fulgurated. At this point the scope was removed and 10 cc of viscous Xylocaine instilled within the urethra and the bladder. A 20 French Foley catheter was placed. 40 mg of mitomycin-C was instilled within the bladder and the catheter was plugged. A B&O suppository was placed. Procedure was then terminated and patient transferred to the recovery room in stable condition.

## 2015-12-29 NOTE — Progress Notes (Signed)
#  18 Foley in place/clamped. Mitomycin instilled in bladder. Will unclamp foley at 1443/drain urine/then d/c foley as ordered.

## 2015-12-29 NOTE — Progress Notes (Signed)
Awake. Foley connected to drainage bag. 15 ml of clear pink urine obtained. ABD pads x2 and linens soiled with contaminated urine. Soiled linens removed and placed in red box. Foley balloon deflated. Foley d/c'd. Contaminated ABD and foley discarded in yellow box. Perineum cleansed with aloetouch protective clothes and clean gown applied. TED hose d/c'd. Transferred to recliner and transferred to SDS in good condition. Perineum skin intact and with irritation.

## 2015-12-29 NOTE — H&P (Signed)
Date of Initial H&P: 12/23/15  History reviewed, patient examined, no change in status, stable for surgery.

## 2015-12-29 NOTE — Anesthesia Preprocedure Evaluation (Signed)
Anesthesia Evaluation  Patient identified by MRN, date of birth, ID band Patient awake    Reviewed: Allergy & Precautions, NPO status , Patient's Chart, lab work & pertinent test results, reviewed documented beta blocker date and time   Airway Mallampati: II  TM Distance: >3 FB Neck ROM: Limited    Dental  (+) Chipped, Poor Dentition   Pulmonary COPD, former smoker,    Pulmonary exam normal breath sounds clear to auscultation       Cardiovascular hypertension, Pt. on medications and Pt. on home beta blockers Normal cardiovascular exam+ Valvular Problems/Murmurs      Neuro/Psych TIAnegative psych ROS   GI/Hepatic negative GI ROS, Neg liver ROS,   Endo/Other  Hypothyroidism   Renal/GU      Musculoskeletal negative musculoskeletal ROS (+)   Abdominal Normal abdominal exam  (+)   Peds negative pediatric ROS (+)  Hematology negative hematology ROS (+)   Anesthesia Other Findings BPH  Reproductive/Obstetrics                             Anesthesia Physical Anesthesia Plan  ASA: III  Anesthesia Plan: General   Post-op Pain Management:    Induction: Intravenous  Airway Management Planned: LMA  Additional Equipment:   Intra-op Plan:   Post-operative Plan: Extubation in OR  Informed Consent: I have reviewed the patients History and Physical, chart, labs and discussed the procedure including the risks, benefits and alternatives for the proposed anesthesia with the patient or authorized representative who has indicated his/her understanding and acceptance.   Dental advisory given  Plan Discussed with: CRNA and Surgeon  Anesthesia Plan Comments:         Anesthesia Quick Evaluation

## 2015-12-29 NOTE — Progress Notes (Signed)
Awake. C/O need to void. Foley in place and clamped because of mitomycin instillation. Small amt of urine leakage from around foley. ABD pads x2 applied under penis.

## 2015-12-29 NOTE — Anesthesia Postprocedure Evaluation (Signed)
Anesthesia Post Note  Patient: Eric Torres  Procedure(s) Performed: Procedure(s) (LRB): CYSTOSCOPY WITH BIOPSY (N/A) TRANSURETHRAL RESECTION OF BLADDER TUMOR (TURBT)/MITOMYCIN INSTILLATION (N/A)  Patient location during evaluation: PACU Anesthesia Type: General Level of consciousness: awake and alert and oriented Pain management: pain level controlled Vital Signs Assessment: post-procedure vital signs reviewed and stable Respiratory status: spontaneous breathing Cardiovascular status: blood pressure returned to baseline Anesthetic complications: no    Last Vitals:  Filed Vitals:   12/29/15 1445 12/29/15 1457  BP:  156/59  Pulse: 54 52  Temp:  36.1 C  Resp: 12 16    Last Pain:  Filed Vitals:   12/29/15 1506  PainSc: 2                  Shakerria Parran

## 2015-12-30 ENCOUNTER — Encounter: Payer: Self-pay | Admitting: Urology

## 2015-12-30 LAB — SURGICAL PATHOLOGY

## 2016-05-19 ENCOUNTER — Other Ambulatory Visit
Admission: RE | Admit: 2016-05-19 | Discharge: 2016-05-19 | Disposition: A | Discharge status: Home / Self Care | Payer: Medicare Other | Source: Ambulatory Visit | Attending: Gastroenterology | Admitting: Gastroenterology

## 2016-05-19 DIAGNOSIS — R197 Diarrhea, unspecified: Secondary | ICD-10-CM | POA: Insufficient documentation

## 2016-05-19 LAB — C DIFFICILE QUICK SCREEN W PCR REFLEX
C DIFFICILE (CDIFF) INTERP: NEGATIVE
C DIFFICLE (CDIFF) ANTIGEN: NEGATIVE
C Diff toxin: NEGATIVE

## 2016-07-22 ENCOUNTER — Other Ambulatory Visit: Payer: Self-pay | Admitting: Ophthalmology

## 2016-07-22 DIAGNOSIS — H4710 Unspecified papilledema: Secondary | ICD-10-CM

## 2016-08-02 ENCOUNTER — Ambulatory Visit
Admission: RE | Admit: 2016-08-02 | Discharge: 2016-08-02 | Disposition: A | Discharge status: Home / Self Care | Payer: Medicare Other | Source: Ambulatory Visit | Attending: Ophthalmology | Admitting: Ophthalmology

## 2016-08-02 DIAGNOSIS — H4710 Unspecified papilledema: Secondary | ICD-10-CM | POA: Diagnosis present

## 2016-08-02 DIAGNOSIS — I638 Other cerebral infarction: Secondary | ICD-10-CM | POA: Insufficient documentation

## 2016-08-02 DIAGNOSIS — R9082 White matter disease, unspecified: Secondary | ICD-10-CM | POA: Insufficient documentation

## 2016-08-02 DIAGNOSIS — J329 Chronic sinusitis, unspecified: Secondary | ICD-10-CM | POA: Insufficient documentation

## 2016-08-02 DIAGNOSIS — G319 Degenerative disease of nervous system, unspecified: Secondary | ICD-10-CM | POA: Insufficient documentation

## 2016-08-02 LAB — POCT I-STAT CREATININE: Creatinine, Ser: 1.1 mg/dL (ref 0.61–1.24)

## 2016-08-02 MED ORDER — GADOBENATE DIMEGLUMINE 529 MG/ML IV SOLN
20.0000 mL | Freq: Once | INTRAVENOUS | Status: AC | PRN
  Administered 2016-08-02: 18 mL via INTRAVENOUS

## 2017-06-15 ENCOUNTER — Ambulatory Visit: Payer: Medicare Other | Attending: Family Medicine | Admitting: Physical Therapy

## 2017-06-15 ENCOUNTER — Encounter: Payer: Self-pay | Admitting: Physical Therapy

## 2017-06-15 DIAGNOSIS — M25551 Pain in right hip: Secondary | ICD-10-CM

## 2017-06-15 DIAGNOSIS — M25552 Pain in left hip: Secondary | ICD-10-CM | POA: Diagnosis present

## 2017-06-15 DIAGNOSIS — R269 Unspecified abnormalities of gait and mobility: Secondary | ICD-10-CM | POA: Insufficient documentation

## 2017-06-15 DIAGNOSIS — R2689 Other abnormalities of gait and mobility: Secondary | ICD-10-CM | POA: Insufficient documentation

## 2017-06-18 NOTE — Therapy (Signed)
[facility] [address] [place], [place], [address] Phone: [phone]   Fax:  [phone]  Physical Therapy Evaluation  Patient Details  Name: Eric Torres MRN: [id number] Date of Birth: 02/26/1932 Referring Provider: Dr. Bobette Mo  Encounter Date: 06/15/2017      PT End of Session - 06/18/17 2032    Visit Number 1   Number of Visits 4   Date for PT Re-Evaluation 07/13/17   Authorization - Visit Number 1   Authorization - Number of Visits 10   PT Start Time [phone]   PT Stop Time 1034   PT Time Calculation (min) 55 min   Equipment Utilized During Treatment Gait belt   Activity Tolerance Patient tolerated treatment well;No increased pain   Behavior During Therapy WFL for tasks assessed/performed      Past Medical History:  Diagnosis Date  . BPH (benign prostatic hyperplasia)   . Cancer [facility])    bladder dec 2013  . COPD (chronic obstructive pulmonary disease) (HCC)    MILD-NO INHALERS PER PT  . Essential tremor   . Heart murmur   . History of brachytherapy   . History of TIA (transient ischemic attack) 2015   LEFT THUMB AND INDEX FINGER-SOMETIMES I DROP THINGS  . Hypercholesteremia   . Hypertension   . Hypothyroidism     Past Surgical History:  Procedure Laterality Date  . CHOLECYSTECTOMY    . CYSTOSCOPY WITH BIOPSY N/A 12/29/2015   Procedure: CYSTOSCOPY WITH BIOPSY;  Surgeon: Royston Cowper, MD;  Location: [facility] ORS;  Service: Urology;  Laterality: N/A;  . EXPLORATORY LAPAROTOMY  1991   VOLVULUS AND BOWEL OBSTRUCTION  . ILIAC ARTERY STENT Bilateral   . TONSILLECTOMY    . TRANSURETHRAL RESECTION OF BLADDER TUMOR N/A 12/29/2015   Procedure: TRANSURETHRAL RESECTION OF BLADDER TUMOR (TURBT)/MITOMYCIN INSTILLATION;  Surgeon: Royston Cowper, MD;  Location: [facility] ORS;  Service: Urology;  Laterality: N/A;  . TRANSURETHRAL RESECTION OF BLADDER TUMOR WITH GYRUS (TURBT-GYRUS)  2013  . VASECTOMY      There were no vitals  filed for this visit.       Subjective Assessment - 06/18/17 2024    Subjective Pt. reports >1 year of B hip pain and states R hip tightens up/ hurts with walking 1/2 block.  Pt. reports no pain in B hips currently at this time.  Pt. participates with water aerobics at [employer].  Pt. is more concerned about his balance/ walking than hip pain at this time.     Limitations Lifting;Standing;Walking;House hold activities   Patient Stated Goals Improve walking/ balance to prevent falls.  Decrease c/o B hip pain with prolonged walking.     Currently in Pain? No/denies            [facility] PT Assessment - 06/18/17 0001      Assessment   Medical Diagnosis Greater trochanteric bursitis of both hips/ Impairment of balance   Referring Provider Dr. Bobette Mo   Onset Date/Surgical Date 10/10/16   Prior Therapy yes     Balance Screen   Has the patient fallen in the past 6 months Yes   How many times? 1   Has the patient had a decrease in activity level because of a fear of falling?  No   Is the patient reluctant to leave their home because of a fear of falling?  No        See HEP        PT  Long Term Goals - 06/18/17 2043      PT LONG TERM GOAL #1   Title Pt. independent with HEP to improve pain-free B hip pain with gym based/ aquatic ex. program to improve mobility.    Baseline Increase hip pain with increase activity.     Time 4   Period Weeks   Status New   Target Date 07/13/17     PT LONG TERM GOAL #2   Title Pt. will increase Berg balance test to >53 out of 56 to decrease fall risk/ improve balance.    Baseline Berg: 51/56 on 9/6   Time 4   Period Weeks   Status New   Target Date 07/13/17     PT LONG TERM GOAL #3   Title Pt. will increase LEFS to >45 out of 80 to improve funcitonal mobility.     Baseline LEFS: 38 out of 80.     Time 4   Period Weeks   Status New   Target Date 07/13/17     PT LONG TERM GOAL #4   Title Pt. able to ambulate 10 minutes on  outside surfaces with normalized gait pattern and no c/o B hip pain.     Baseline increase hip pain with distance walked.    Time 4   Period Weeks   Status New   Target Date 07/13/17                Plan - 06/18/17 2034    Clinical Impression Statement Pt is a pleasant 83 y/o male with chronic h/o B hip pain and balance issues.  Pt. is known to PT due to gait/balance issues in pain.  Pt. reports recent falls while at [employer] (no injury).  Pt. presents with good lumbar AROM (all planes).  Minimal tenderness over B greater trochanters with palpation/ no pain reported currently.  Pt. requires use of handrails while ascending/ descending steps.  B LE muscle strength grossly 5/5 MMT.  Moderate B hamstring muscle tightness noted.  Pt. ambulates with no assistive device and reports increase hip pain with increase distance walked.  Berg balance test: 51/56.  R great toe pain/ neuropathy.  LEFS: 38 out of 80.  Pt. will benefit from short-term skilled PT services to improve balance/gait/ hip pain to improve mobility.       Clinical Presentation Stable   Clinical Decision Making Moderate   Rehab Potential Good   PT Frequency 1x / week   PT Duration 4 weeks   PT Treatment/Interventions ADLs/Self Care Home Management;Gait training;Stair training;Functional mobility training;Therapeutic activities;Therapeutic exercise;Balance training;Neuromuscular re-education;Patient/family education;Manual techniques   PT Next Visit Plan Reassess B hip pain/ balance ex.   Consulted and Agree with Plan of Care Patient      Patient will benefit from skilled therapeutic intervention in order to improve the following deficits and impairments:  Abnormal gait, Decreased mobility, Decreased strength, Decreased range of motion, Decreased endurance, Decreased activity tolerance, Decreased balance, Difficulty walking  Visit Diagnosis: Pain in right hip  Pain in left hip  Gait difficulty  Impairment of  balance     Problem List There are no active problems to display for this patient.  Pura Spice, PT, DPT # [phone] 06/18/2017, 8:49 PM  [facility] [address] [place], [place], [address] Phone: [phone]   Fax:  [phone]  Name: SHARROD ACHILLE MRN: [id number] Date of Birth: 1931/10/12

## 2017-06-21 ENCOUNTER — Ambulatory Visit: Payer: Medicare Other | Admitting: Physical Therapy

## 2017-06-21 ENCOUNTER — Encounter: Payer: Self-pay | Admitting: Physical Therapy

## 2017-06-21 DIAGNOSIS — M25551 Pain in right hip: Secondary | ICD-10-CM

## 2017-06-21 DIAGNOSIS — R269 Unspecified abnormalities of gait and mobility: Secondary | ICD-10-CM

## 2017-06-21 DIAGNOSIS — R2689 Other abnormalities of gait and mobility: Secondary | ICD-10-CM

## 2017-06-21 DIAGNOSIS — M25552 Pain in left hip: Secondary | ICD-10-CM

## 2017-06-21 NOTE — Therapy (Signed)
[facility] [address] [place], [place], [address] Phone: [phone]   Fax:  [phone]  Physical Therapy Treatment  Patient Details  Name: Eric Torres MRN: [id number] Date of Birth: 1931/10/31 Referring Provider: Dr. Bobette Mo  Encounter Date: 06/21/2017      PT End of Session - 06/21/17 0825    Visit Number 2   Number of Visits 4   Date for PT Re-Evaluation 07/13/17   Authorization - Visit Number 2   Authorization - Number of Visits 10   PT Start Time 0815   Equipment Utilized During Treatment Gait belt   Activity Tolerance Patient tolerated treatment well;No increased pain   Behavior During Therapy WFL for tasks assessed/performed      Past Medical History:  Diagnosis Date  . BPH (benign prostatic hyperplasia)   . Cancer [facility])    bladder dec 2013  . COPD (chronic obstructive pulmonary disease) (HCC)    MILD-NO INHALERS PER PT  . Essential tremor   . Heart murmur   . History of brachytherapy   . History of TIA (transient ischemic attack) 2015   LEFT THUMB AND INDEX FINGER-SOMETIMES I DROP THINGS  . Hypercholesteremia   . Hypertension   . Hypothyroidism     Past Surgical History:  Procedure Laterality Date  . CHOLECYSTECTOMY    . CYSTOSCOPY WITH BIOPSY N/A 12/29/2015   Procedure: CYSTOSCOPY WITH BIOPSY;  Surgeon: Royston Cowper, MD;  Location: [facility] ORS;  Service: Urology;  Laterality: N/A;  . EXPLORATORY LAPAROTOMY  1991   VOLVULUS AND BOWEL OBSTRUCTION  . ILIAC ARTERY STENT Bilateral   . TONSILLECTOMY    . TRANSURETHRAL RESECTION OF BLADDER TUMOR N/A 12/29/2015   Procedure: TRANSURETHRAL RESECTION OF BLADDER TUMOR (TURBT)/MITOMYCIN INSTILLATION;  Surgeon: Royston Cowper, MD;  Location: [facility] ORS;  Service: Urology;  Laterality: N/A;  . TRANSURETHRAL RESECTION OF BLADDER TUMOR WITH GYRUS (TURBT-GYRUS)  2013  . VASECTOMY      There were no vitals filed for this visit.      Subjective Assessment -  06/21/17 0824    Subjective Pt. reports no hip pain currently while walking into PT clinic.  Pt. states he was at [facility] this past weekend and fell backwards while playing a ball game.  No injuries.     Limitations Lifting;Standing;Walking;House hold activities   Patient Stated Goals Improve walking/ balance to prevent falls.  Decrease c/o B hip pain with prolonged walking.     Currently in Pain? No/denies        Therex.:  Amb. In hallway (3 laps).  Scifit L6 10 min. B LE only (warm-up).                                PT Long Term Goals - 06/18/17 2043      PT LONG TERM GOAL #1   Title Pt. independent with HEP to improve pain-free B hip pain with gym based/ aquatic ex. program to improve mobility.    Baseline Increase hip pain with increase activity.     Time 4   Period Weeks   Status New   Target Date 07/13/17     PT LONG TERM GOAL #2   Title Pt. will increase Berg balance test to >53 out of 56 to decrease fall risk/ improve balance.    Baseline Berg: 51/56 on 9/6   Time 4   Period Weeks  Status New   Target Date 07/13/17     PT LONG TERM GOAL #3   Title Pt. will increase LEFS to >45 out of 80 to improve funcitonal mobility.     Baseline LEFS: 38 out of 80.     Time 4   Period Weeks   Status New   Target Date 07/13/17     PT LONG TERM GOAL #4   Title Pt. able to ambulate 10 minutes on outside surfaces with normalized gait pattern and no c/o B hip pain.     Baseline increase hip pain with distance walked.    Time 4   Period Weeks   Status New   Target Date 07/13/17               Plan - 06/21/17 0826    Clinical Impression Statement Minimal to no B hip tenderness with palpation over B greater trochanter.     Clinical Presentation Stable   Clinical Decision Making Moderate   Rehab Potential Good   PT Frequency 1x / week   PT Duration 4 weeks   PT Treatment/Interventions ADLs/Self Care Home Management;Gait  training;Stair training;Functional mobility training;Therapeutic activities;Therapeutic exercise;Balance training;Neuromuscular re-education;Patient/family education;Manual techniques   PT Next Visit Plan Progress balance ex.   Consulted and Agree with Plan of Care Patient      Patient will benefit from skilled therapeutic intervention in order to improve the following deficits and impairments:  Abnormal gait, Decreased mobility, Decreased strength, Decreased range of motion, Decreased endurance, Decreased activity tolerance, Decreased balance, Difficulty walking  Visit Diagnosis: Pain in right hip  Pain in left hip  Gait difficulty  Impairment of balance     Problem List There are no active problems to display for this patient.   Pura Spice 06/21/2017, 8:26 AM  [facility] [address] [place], [place], [address] Phone: [phone]   Fax:  [phone]  Name: Eric Torres MRN: [id number] Date of Birth: [birth year]

## 2017-06-28 ENCOUNTER — Ambulatory Visit: Payer: Medicare Other | Admitting: Physical Therapy

## 2017-06-28 ENCOUNTER — Encounter: Payer: Self-pay | Admitting: Physical Therapy

## 2017-06-28 DIAGNOSIS — M25552 Pain in left hip: Secondary | ICD-10-CM

## 2017-06-28 DIAGNOSIS — R269 Unspecified abnormalities of gait and mobility: Secondary | ICD-10-CM

## 2017-06-28 DIAGNOSIS — M25551 Pain in right hip: Secondary | ICD-10-CM

## 2017-06-28 DIAGNOSIS — R2689 Other abnormalities of gait and mobility: Secondary | ICD-10-CM
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
## 2017-07-05 ENCOUNTER — Encounter: Payer: Medicare Other | Admitting: Physical Therapy

## 2017-07-06 ENCOUNTER — Encounter: Payer: Self-pay | Admitting: Physical Therapy

## 2017-07-06 ENCOUNTER — Ambulatory Visit: Payer: Medicare Other | Admitting: Physical Therapy

## 2017-07-06 DIAGNOSIS — M25552 Pain in left hip: Secondary | ICD-10-CM

## 2017-07-06 DIAGNOSIS — M25551 Pain in right hip: Secondary | ICD-10-CM

## 2017-07-06 DIAGNOSIS — R2689 Other abnormalities of gait and mobility: Secondary | ICD-10-CM

## 2017-07-06 DIAGNOSIS — R269 Unspecified abnormalities of gait and mobility: Secondary | ICD-10-CM

## 2017-07-06 NOTE — Therapy (Signed)
[facility] [address] [place], [place], [address] Phone: [phone]   Fax:  [phone]  Physical Therapy Treatment  Patient Details  Name: Eric Torres MRN: [id number] Date of Birth: [birth year] Referring Provider: Dr. Bobette Mo  Encounter Date: 07/06/2017      PT End of Session - 07/06/17 0940    Visit Number 4   Number of Visits 4   Date for PT Re-Evaluation 07/13/17   Authorization - Visit Number 4   Authorization - Number of Visits 10   PT Start Time 0932   PT Stop Time 1024   PT Time Calculation (min) 52 min   Activity Tolerance Patient tolerated treatment well;No increased pain   Behavior During Therapy WFL for tasks assessed/performed      Past Medical History:  Diagnosis Date  . BPH (benign prostatic hyperplasia)   . 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 COPD (chronic obstructive pulmonary disease) (HCC)    MILD-NO INHALERS PER PT  . Essential tremor   . Heart murmur   . History of brachytherapy   . History of TIA (transient ischemic attack) 2015   LEFT THUMB AND INDEX FINGER-SOMETIMES I DROP THINGS  . Hypercholesteremia   . Hypertension   . Hypothyroidism     Past Surgical History:  Procedure Laterality Date  . CHOLECYSTECTOMY    . CYSTOSCOPY WITH BIOPSY N/A 12/29/2015   Procedure: CYSTOSCOPY WITH BIOPSY;  Surgeon: Royston Cowper, MD;  Location: [facility] ORS;  Service: Urology;  Laterality: N/A;  . EXPLORATORY LAPAROTOMY  1991   VOLVULUS AND BOWEL OBSTRUCTION  . ILIAC ARTERY STENT Bilateral   . TONSILLECTOMY    . TRANSURETHRAL RESECTION OF BLADDER TUMOR N/A 12/29/2015   Procedure: TRANSURETHRAL RESECTION OF BLADDER TUMOR (TURBT)/MITOMYCIN INSTILLATION;  Surgeon: Royston Cowper, MD;  Location: [facility] ORS;  Service: Urology;  Laterality: N/A;  . TRANSURETHRAL RESECTION OF BLADDER TUMOR WITH GYRUS (TURBT-GYRUS)  2013  . VASECTOMY      There were no vitals filed for this visit.      Subjective  Assessment - 07/06/17 0939    Subjective Pt. reports no hip pain at this time.  Pt. states he was sitting at MD office for an hour with legs crossed with no hip discomfort.     Limitations Lifting;Standing;Walking;House hold activities   Patient Stated Goals Improve walking/ balance to prevent falls.  Decrease c/o B hip pain with prolonged walking.     Currently in Pain? No/denies       There.ex.:   Scifit L6.5 12 min. B UE/LE (no pain/warm-up). Seated GTB hip abd./ Standing GTB marching/ sidestepping in //-bars with no UE assist 10x.  Seated PF/DF 20x. Sit to stands with min. UE assist.   Standing heel raises/ hip abd/ hip flexion (all requiring light UE touch in //-bars). Supine marching/ hip abd./ hamstring and ITB stretches 3x each.    Neuro: Recip. Step ups/down with 1 UE assist 14x on 4 steps.  1 episode of catching heel on step with descending.   Airex step ups/ downs with 6" step in //-bars/ marching/ EC tasks Walking in PT clinic/ hallway with cuing to increase step pattern/ BOS/ heel strike.       PT Long Term Goals - 06/18/17 2043      PT LONG TERM GOAL #1   Title Pt. independent with HEP to improve pain-free B hip pain with gym based/ aquatic ex. program to improve mobility.  Baseline Increase hip pain with increase activity.     Time 4   Period Weeks   Status New   Target Date 07/13/17     PT LONG TERM GOAL #2   Title Pt. will increase Berg balance test to >53 out of 56 to decrease fall risk/ improve balance.    Baseline Berg: 51/56 on 9/6   Time 4   Period Weeks   Status New   Target Date 07/13/17     PT LONG TERM GOAL #3   Title Pt. will increase LEFS to >45 out of 80 to improve funcitonal mobility.     Baseline LEFS: 38 out of 80.     Time 4   Period Weeks   Status New   Target Date 07/13/17     PT LONG TERM GOAL #4   Title Pt. able to ambulate 10 minutes on outside surfaces with normalized gait pattern and no c/o B hip pain.     Baseline  increase hip pain with distance walked.    Time 4   Period Weeks   Status New   Target Date 07/13/17               Plan - 07/06/17 0940    Clinical Impression Statement Pt. working hard with PT tx. session with primary focus on balance activities/ LE muscle strengthening.  No c/o hip pain today.  Pt. improving with Airex ex./ walking in clinic with less staggering gait pattern.  Probable recert/ discharge next tx. session with focus on gym based/ aquatic ex. program.     Clinical Presentation Stable   Clinical Decision Making Moderate   Rehab Potential Good   PT Frequency 1x / week   PT Duration 4 weeks   PT Treatment/Interventions ADLs/Self Care Home Management;Gait training;Stair training;Functional mobility training;Therapeutic activities;Therapeutic exercise;Balance training;Neuromuscular re-education;Patient/family education;Manual techniques   PT Next Visit Plan Progress balance ex.   RECERT vs. DISCHARGE next tx.     Consulted and Agree with Plan of Care Patient      Patient will benefit from skilled therapeutic intervention in order to improve the following deficits and impairments:  Abnormal gait, Decreased mobility, Decreased strength, Decreased range of motion, Decreased endurance, Decreased activity tolerance, Decreased balance, Difficulty walking  Visit Diagnosis: Pain in right hip  Pain in left hip  Gait difficulty  Impairment of balance     Problem List There are no active problems to display for this patient.  Pura Spice, PT, DPT # [id number] 07/06/2017, 8:39 PM  [facility] [address] [place], [place], [address] Phone: [phone]   Fax:  [phone]  Name: Eric Torres MRN: [id number] Date of Birth: January 21, 1932

## 2017-07-12 ENCOUNTER — Ambulatory Visit: Payer: Medicare Other | Attending: Family Medicine | Admitting: Physical Therapy

## 2017-07-12 ENCOUNTER — Encounter: Payer: Self-pay | Admitting: Physical Therapy

## 2017-07-12 DIAGNOSIS — R269 Unspecified abnormalities of gait and mobility: Secondary | ICD-10-CM | POA: Diagnosis present

## 2017-07-12 DIAGNOSIS — M25551 Pain in right hip: Secondary | ICD-10-CM | POA: Diagnosis not present

## 2017-07-12 DIAGNOSIS — R2689 Other abnormalities of gait and mobility: Secondary | ICD-10-CM

## 2017-07-12 DIAGNOSIS — M25552 Pain in left hip: Secondary | ICD-10-CM

## 2017-07-12 NOTE — Therapy (Signed)
[place] [facility] [address] [place], [place], [address] Phone: [phone]   Fax:  [phone]  Physical Therapy Treatment  Patient Details  Name: Eric Torres MRN: [id number] Date of Birth: 01-20-1932 Referring Provider: Dr. Bobette Mo  Encounter Date: 07/12/2017      PT End of Session - 07/12/17 0951    Visit Number 5   Number of Visits 9   Date for PT Re-Evaluation 08/09/17   Authorization - Visit Number 5   Authorization - Number of Visits 14   PT Start Time 4462   PT Stop Time 1037   PT Time Calculation (min) 54 min   Equipment Utilized During Treatment Gait belt   Activity Tolerance Patient tolerated treatment well;No increased pain   Behavior During Therapy WFL for tasks assessed/performed      Past Medical History:  Diagnosis Date  . BPH (benign prostatic hyperplasia)   . 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 COPD (chronic obstructive pulmonary disease) (HCC)    MILD-NO INHALERS PER PT  . Essential tremor   . Heart murmur   . History of brachytherapy   . History of TIA (transient ischemic attack) 2015   LEFT THUMB AND INDEX FINGER-SOMETIMES I DROP THINGS  . Hypercholesteremia   . Hypertension   . Hypothyroidism     Past Surgical History:  Procedure Laterality Date  . CHOLECYSTECTOMY    . CYSTOSCOPY WITH BIOPSY N/A 12/29/2015   Procedure: CYSTOSCOPY WITH BIOPSY;  Surgeon: Royston Cowper, MD;  Location: [facility] ORS;  Service: Urology;  Laterality: N/A;  . EXPLORATORY LAPAROTOMY  1991   VOLVULUS AND BOWEL OBSTRUCTION  . ILIAC ARTERY STENT Bilateral   . TONSILLECTOMY    . TRANSURETHRAL RESECTION OF BLADDER TUMOR N/A 12/29/2015   Procedure: TRANSURETHRAL RESECTION OF BLADDER TUMOR (TURBT)/MITOMYCIN INSTILLATION;  Surgeon: Royston Cowper, MD;  Location: [facility] ORS;  Service: Urology;  Laterality: N/A;  . TRANSURETHRAL RESECTION OF BLADDER TUMOR WITH GYRUS (TURBT-GYRUS)  2013  . VASECTOMY      There were no vitals  filed for this visit.      Subjective Assessment - 07/12/17 0951    Subjective Pt. reports he is doing well.  No new complaints.     Limitations Lifting;Standing;Walking;House hold activities   Patient Stated Goals Improve walking/ balance to prevent falls.  Decrease c/o B hip pain with prolonged walking.     Currently in Pain? No/denies     LEFS: 50 out of 80  There.ex.:  Scifit L6.5 12 min. B UE/LE (no pain/warm-up). Standing hip ex. (no resistance) 20x all planes (light to no UE assist).    Neuro: Merrilee Jansky: 52/56 Resisted gait with 1BTB 10x all 4-planes (light to no UE assist).  Recip. Step ups/down with 1 UE assist 10x on 4 steps.     Airex step ups/ downs with 6" step in //-bars/ marching/ EC tasks Walking in PT clinic/ hallway with cuing to increase step pattern/ BOS/ heel strike.        PT Long Term Goals - 07/12/17 1759      PT LONG TERM GOAL #1   Title Pt. independent with HEP to improve pain-free B hip pain with gym based/ aquatic ex. program to improve mobility.    Baseline Aquatic ex. 2x/week and HEP   Time 4   Period Weeks   Status Achieved   Target Date 08/09/17     PT LONG TERM GOAL #2   Title  Pt. will increase Berg balance test to >53 out of 56 to decrease fall risk/ improve balance.    Baseline Berg: 51/56 on 9/6.  Berg: 52/56 (small improvement)   Time 4   Period Weeks   Status Partially Met   Target Date 08/09/17     PT LONG TERM GOAL #3   Title Pt. will increase LEFS to >45 out of 80 to improve funcitonal mobility.     Baseline LEFS: 38 out of 80 on eval.  LEFS: 50 out of 80 on 08-09-17   Time 4   Period Weeks   Status Achieved   Target Date 08/09/17     PT LONG TERM GOAL #4   Title Pt. able to ambulate 10 minutes on outside surfaces with normalized gait pattern and no c/o B hip pain.     Baseline increase hip pain with distance walked.    Time 4   Period Weeks   Status Not Met   Target Date 08/09/17             Plan - 08-09-2017  1755    Clinical Impression Statement LEFS: 50 out of 80.  No hip tenderness reported today with palpation from prox. to distal ITB.  Pt. ambulates with slight antalgic gait pattern with posterior sway/ LOB with higher level balacne tasks.     Clinical Presentation Stable   Clinical Decision Making Moderate   Rehab Potential Good   PT Frequency 1x / week   PT Duration 4 weeks   PT Treatment/Interventions ADLs/Self Care Home Management;Gait training;Stair training;Functional mobility training;Therapeutic activities;Therapeutic exercise;Balance training;Neuromuscular re-education;Patient/family education;Manual techniques   PT Next Visit Plan Progress LE stability/ balance program.     Consulted and Agree with Plan of Care Patient      Patient will benefit from skilled therapeutic intervention in order to improve the following deficits and impairments:  Abnormal gait, Decreased mobility, Decreased strength, Decreased range of motion, Decreased endurance, Decreased activity tolerance, Decreased balance, Difficulty walking  Visit Diagnosis: Pain in right hip  Pain in left hip  Gait difficulty  Impairment of balance       G-Codes - 2017/08/09 1801    Functional Assessment Tool Used (Outpatient Only) Berg/ LEFS/ Clinical impression/ B hip pain/ gait difficulty   Functional Limitation Mobility: Walking and moving around   Mobility: Walking and Moving Around Current Status (Z6109) At least 20 percent but less than 40 percent impaired, limited or restricted   Mobility: Walking and Moving Around Goal Status (U0454) At least 1 percent but less than 20 percent impaired, limited or restricted      Problem List There are no active problems to display for this patient.  Pura Spice, PT, DPT # [id number] 08/09/17, 6:01 PM  [place] [facility] [address] [place], [place], [address] Phone: [phone]   Fax:  [phone]  Name: Eric Torres MRN:  [id number] Date of Birth: 10-17-1931

## 2017-07-19 ENCOUNTER — Encounter: Payer: Self-pay | Admitting: Physical Therapy

## 2017-07-19 ENCOUNTER — Ambulatory Visit: Payer: Medicare Other | Admitting: Physical Therapy

## 2017-07-19 DIAGNOSIS — M25552 Pain in left hip: Secondary | ICD-10-CM

## 2017-07-19 DIAGNOSIS — M25551 Pain in right hip: Secondary | ICD-10-CM

## 2017-07-19 DIAGNOSIS — R2689 Other abnormalities of gait and mobility: Secondary | ICD-10-CM

## 2017-07-19 DIAGNOSIS — R269 Unspecified abnormalities of gait and mobility: Secondary | ICD-10-CM

## 2017-07-19 NOTE — Therapy (Signed)
[place] [facility] [address] [place], [place], [address] Phone: [phone]   Fax:  [phone]  Physical Therapy Treatment  Patient Details  Name: Eric Torres MRN: [id number] Date of Birth: 1932-06-18 Referring Provider: Dr. Bobette Mo  Encounter Date: 07/19/2017      PT End of Session - 07/19/17 1016    Visit Number 6   Number of Visits 9   Date for PT Re-Evaluation 08/09/17   Authorization - Visit Number 6   Authorization - Number of Visits 14   PT Start Time 2992   PT Stop Time 4268   PT Time Calculation (min) 63 min   Equipment Utilized During Treatment Gait belt   Activity Tolerance Patient tolerated treatment well;No increased pain   Behavior During Therapy WFL for tasks assessed/performed      Past Medical History:  Diagnosis Date  . BPH (benign prostatic hyperplasia)   . Cancer 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 COPD (chronic obstructive pulmonary disease) (HCC)    MILD-NO INHALERS PER PT  . Essential tremor   . Heart murmur   . History of brachytherapy   . History of TIA (transient ischemic attack) 2015   LEFT THUMB AND INDEX FINGER-SOMETIMES I DROP THINGS  . Hypercholesteremia   . Hypertension   . Hypothyroidism     Past Surgical History:  Procedure Laterality Date  . CHOLECYSTECTOMY    . CYSTOSCOPY WITH BIOPSY N/A 12/29/2015   Procedure: CYSTOSCOPY WITH BIOPSY;  Surgeon: Royston Cowper, MD;  Location: [facility] ORS;  Service: Urology;  Laterality: N/A;  . EXPLORATORY LAPAROTOMY  1991   VOLVULUS AND BOWEL OBSTRUCTION  . ILIAC ARTERY STENT Bilateral   . TONSILLECTOMY    . TRANSURETHRAL RESECTION OF BLADDER TUMOR N/A 12/29/2015   Procedure: TRANSURETHRAL RESECTION OF BLADDER TUMOR (TURBT)/MITOMYCIN INSTILLATION;  Surgeon: Royston Cowper, MD;  Location: [facility] OR[facility];  Service: Urology;  Laterality: N/A;  . TRANSURETHRAL RESECTION OF BLADDER TUMOR WITH GYRUS (TURBT-GYRUS)  2013  . VASECTOMY      There were no vitals  filed for this visit.      Subjective Assessment - 07/19/17 0947    Subjective Pt. reports he is doing ok today; he reports feeling some anxiety about the weather, but also expresses humor. He reports his back is stiff today. No falls since last visit.   Limitations Lifting;Standing;Walking;House hold activities   Patient Stated Goals Improve walking/ balance to prevent falls.  Decrease c/o B hip pain with prolonged walking.     Currently in Pain? Yes   Pain Score 5    Pain Location Back   Pain Orientation Left;Right;Lower   Pain Descriptors / Indicators --  Stiffness   Pain Type Chronic pain   Pain Onset More than a month ago   Pain Frequency Occasional   Aggravating Factors  Forward bending, worse in the morning    Pain Relieving Factors Movement, water aerobics   Effect of Pain on Daily Activities Reduced activity level   Multiple Pain Sites No        Ther-ex:   Scifit L6 x 8 min. B UE/LE (no increased pain) -un-billed Standing  4 way hip with red t-band with BUE assist for balance; cues to avoid compensatory leaning and to keep toe pointed forward. HEP with handout given for 4 way hip.  Zig-zag walk in // bars x 4 laps with cues for increased eccentric control, resisted with red t-band  Neuro: Lunges to  BOSU ball with min UE assist/CGA for safety Airex weighted ball pass with 7# ball in neutral stance and with 3# ball in Romberg stance in diagonal patterns 3 x 1 min each exercise in each stance. Pt had slight provokation of LBP with ball pass at 4-5/10 pain  Manual therapy: Supine  Hamstring stretch, piriformis stretch using muscle energy technique for contract-relax stretch, 5 sec contract and 20 sec relax x 3 iterations each side. Pt had decreased hamstring tightness from 51 deg with SLR to 63 deg (measured on RLE, but response was similar bilaterally) following stretch. Pt reported 0/10 pain following stretching         PT Education - 07/19/17 0957    Education  provided Yes   Education Details Ther-ex, balance training,    Person(s) Educated Patient   Methods Explanation;Demonstration;Tactile cues;Verbal cues;Handout   Comprehension Verbal cues required;Tactile cues required;Returned demonstration;Verbalized understanding             PT Long Term Goals - 07/12/17 1759      PT LONG TERM GOAL #1   Title Pt. independent with HEP to improve pain-free B hip pain with gym based/ aquatic ex. program to improve mobility.    Baseline Aquatic ex. 2x/week and HEP   Time 4   Period Weeks   Status Achieved   Target Date 08/09/17     PT LONG TERM GOAL #2   Title Pt. will increase Berg balance test to >53 out of 56 to decrease fall risk/ improve balance.    Baseline Berg: 51/56 on 9/6.  Berg: 52/56 (small improvement)   Time 4   Period Weeks   Status Partially Met   Target Date 08/09/17     PT LONG TERM GOAL #3   Title Pt. will increase LEFS to >45 out of 80 to improve funcitonal mobility.     Baseline LEFS: 38 out of 80 on eval.  LEFS: 50 out of 80 on 07/12/17   Time 4   Period Weeks   Status Achieved   Target Date 08/09/17     PT LONG TERM GOAL #4   Title Pt. able to ambulate 10 minutes on outside surfaces with normalized gait pattern and no c/o B hip pain.     Baseline increase hip pain with distance walked.    Time 4   Period Weeks   Status Not Met   Target Date 08/09/17            Plan - 07/19/17 1059    Clinical Impression Statement Pt presents to therapy with 5/10 LBP. Pt had slight increase in LBP with weighted ball passes in parallel bars that resolved to 0/10 pain following LE and hip stretching. Pt had decreased hip strategy with repeated posterior lateral LOB with balance training. Pt showed in-session improvement with hip strategy with weighted ball bass on Airex foam.   Clinical Presentation Stable   Clinical Decision Making Moderate   Rehab Potential Good   PT Frequency 1x / week   PT Duration 4 weeks   PT  Treatment/Interventions ADLs/Self Care Home Management;Gait training;Stair training;Functional mobility training;Therapeutic activities;Therapeutic exercise;Balance training;Neuromuscular re-education;Patient/family education;Manual techniques   PT Next Visit Plan Progress LE stability/ balance program.     Consulted and Agree with Plan of Care Patient      Patient will benefit from skilled therapeutic intervention in order to improve the following deficits and impairments:  Abnormal gait, Decreased mobility, Decreased strength, Decreased range of motion, Decreased endurance, Decreased activity   tolerance, Decreased balance, Difficulty walking  Visit Diagnosis: Pain in right hip  Pain in left hip  Gait difficulty  Impairment of balance     Problem List There are no active problems to display for this patient.   M , SPT Michael C Sherk, PT, DPT # [id number] 07/19/2017, 1:16 PM  [place] [facility] [address] [place], [place], [address] Phone: [phone]   Fax:  [phone]  Name: Eric Torres MRN: [id number] Date of Birth: 03/19/1932   

## 2017-07-28 ENCOUNTER — Encounter: Payer: Self-pay | Admitting: Physical Therapy

## 2017-07-28 ENCOUNTER — Ambulatory Visit: Payer: Medicare Other | Admitting: Physical Therapy

## 2017-07-28 DIAGNOSIS — M25551 Pain in right hip: Secondary | ICD-10-CM

## 2017-07-28 DIAGNOSIS — R269 Unspecified abnormalities of gait and mobility: Secondary | ICD-10-CM

## 2017-07-28 DIAGNOSIS — M25552 Pain in left hip: Secondary | ICD-10-CM

## 2017-07-28 DIAGNOSIS — R2689 Other abnormalities of gait and mobility: Secondary | ICD-10-CM

## 2017-07-28 NOTE — Therapy (Signed)
[facility] [address] [place], [place], [address] Phone: [phone]   Fax:  [phone]  Physical Therapy Treatment  Patient Details  Name: Eric Torres MRN: [id number] Date of Birth: 1931-11-08 Referring Provider: Dr. Bobette Mo  Encounter Date: 07/28/2017      PT End of Session - 07/28/17 1012    Visit Number 7   Number of Visits 9   Date for PT Re-Evaluation 08/09/17   Authorization - Visit Number 7   Authorization - Number of Visits 14   PT Start Time 4081   PT Stop Time 4481   PT Time Calculation (min) 57 min   Equipment Utilized During Treatment Gait belt   Activity Tolerance Patient tolerated treatment well;No increased pain   Behavior During Therapy WFL for tasks assessed/performed      Past Medical History:  Diagnosis Date  . BPH (benign prostatic hyperplasia)   . 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 COPD (chronic obstructive pulmonary disease) (HCC)    MILD-NO INHALERS PER PT  . Essential tremor   . Heart murmur   . History of brachytherapy   . History of TIA (transient ischemic attack) 2015   LEFT THUMB AND INDEX FINGER-SOMETIMES I DROP THINGS  . Hypercholesteremia   . Hypertension   . Hypothyroidism     Past Surgical History:  Procedure Laterality Date  . CHOLECYSTECTOMY    . CYSTOSCOPY WITH BIOPSY N/A 12/29/2015   Procedure: CYSTOSCOPY WITH BIOPSY;  Surgeon: Royston Cowper, MD;  Location: [facility] ORS;  Service: Urology;  Laterality: N/A;  . EXPLORATORY LAPAROTOMY  1991   VOLVULUS AND BOWEL OBSTRUCTION  . ILIAC ARTERY STENT Bilateral   . TONSILLECTOMY    . TRANSURETHRAL RESECTION OF BLADDER TUMOR N/A 12/29/2015   Procedure: TRANSURETHRAL RESECTION OF BLADDER TUMOR (TURBT)/MITOMYCIN INSTILLATION;  Surgeon: Royston Cowper, MD;  Location: [facility] ORS;  Service: Urology;  Laterality: N/A;  . TRANSURETHRAL RESECTION OF BLADDER TUMOR WITH GYRUS (TURBT-GYRUS)  2013  . VASECTOMY      There were no vitals  filed for this visit.      Subjective Assessment - 07/28/17 0953    Subjective Pt reports he is doing ok, nothing new to report. He is having trouble with one of his hip exercises due to difficulty understanding the instructions   Limitations Lifting;Standing;Walking;House hold activities   Patient Stated Goals Improve walking/ balance to prevent falls.  Decrease c/o B hip pain with prolonged walking.     Currently in Pain? No/denies   Pain Onset More than a month ago       Ther-ex:   Scifit L6 B UE/LE x 10 min (no increased pain) -un-billed  Standing in // bars: Re-instructed 4 way hip with red t-band with BUE assist (pt required demonstration for hip adductor exercise set-up; pt advanced to green band. Zig-zag walk with cues for increased eccentric control, resisted with green t-band x 4 laps  Supine  Hamstring stretch, glut med, piriformis stretch using muscle energy technique for contract-relax stretch, 5 sec contract and 20 sec relax x 3 iterations each side.    Neuro: Standing in // bars: Lunges to BOSU ball with min UE assist x 15 each LE forward alternating Dynamic balance with stance progression on Airex with head turns in in Romberg stance with SBA; pt able to perform max head turn with good weight shift with only occasional LOB  Staggered stance on 2" foam and BOSU x 10  with each LE forward (pt with multiple lateral LOB; pt with in-session improvement able to perform small head turns with occasional LOB)        PT Education - 07/28/17 1008    Education provided Yes   Education Details HEP clarified, ther-ex   Person(s) Educated Patient   Methods Explanation;Demonstration;Tactile cues;Verbal cues;Handout   Comprehension Tactile cues required;Verbal cues required;Returned demonstration;Verbalized understanding             PT Long Term Goals - 07/12/17 1759      PT LONG TERM GOAL #1   Title Pt. independent with HEP to improve pain-free B hip pain with gym  based/ aquatic ex. program to improve mobility.    Baseline Aquatic ex. 2x/week and HEP   Time 4   Period Weeks   Status Achieved   Target Date 08/09/17     PT LONG TERM GOAL #2   Title Pt. will increase Berg balance test to >53 out of 56 to decrease fall risk/ improve balance.    Baseline Berg: 51/56 on 9/6.  Berg: 52/56 (small improvement)   Time 4   Period Weeks   Status Partially Met   Target Date 08/09/17     PT LONG TERM GOAL #3   Title Pt. will increase LEFS to >45 out of 80 to improve funcitonal mobility.     Baseline LEFS: 38 out of 80 on eval.  LEFS: 50 out of 80 on 07/12/17   Time 4   Period Weeks   Status Achieved   Target Date 08/09/17     PT LONG TERM GOAL #4   Title Pt. able to ambulate 10 minutes on outside surfaces with normalized gait pattern and no c/o B hip pain.     Baseline increase hip pain with distance walked.    Time 4   Period Weeks   Status Not Met   Target Date 08/09/17             Plan - 07/28/17 1736    Clinical Impression Statement Pt with muscle tightness and decreased muscle length in hamstrings, and in hip abductors with stretching in supine. Pt had decreased muscle tightness following contract-relax stretching. Pt continues to have balance limitations with poor hip and ankle control; pt shows in-session improvement with dynamic balance    Clinical Presentation Stable   Clinical Decision Making Moderate   Rehab Potential Good   PT Frequency 1x / week   PT Duration 4 weeks   PT Treatment/Interventions ADLs/Self Care Home Management;Gait training;Stair training;Functional mobility training;Therapeutic activities;Therapeutic exercise;Balance training;Neuromuscular re-education;Patient/family education;Manual techniques   PT Next Visit Plan Hip/LE stretching (contract-relax), consider resisted walking with Nautilus, continue dynamic balance focus on knee/ankle control   PT Home Exercise Plan 4-way hip   Consulted and Agree with Plan of  Care Patient      Patient will benefit from skilled therapeutic intervention in order to improve the following deficits and impairments:  Abnormal gait, Decreased mobility, Decreased strength, Decreased range of motion, Decreased endurance, Decreased activity tolerance, Decreased balance, Difficulty walking  Visit Diagnosis: Pain in right hip  Pain in left hip  Gait difficulty  Impairment of balance     Problem List There are no active problems to display for this patient.  Burnett Corrente, SPT Pura Spice, PT, DPT # [phone] 07/28/2017, 6:02 PM  [facility] [address] [place], [place], [address] Phone: [phone]   Fax:  [phone]  Name:  Eric Torres MRN: [id number] Date of Birth: 10/02/1932

## 2017-08-02 ENCOUNTER — Encounter: Payer: Self-pay | Admitting: Physical Therapy

## 2017-08-02 ENCOUNTER — Ambulatory Visit: Payer: Medicare Other | Admitting: Physical Therapy

## 2017-08-02 DIAGNOSIS — M25551 Pain in right hip: Secondary | ICD-10-CM

## 2017-08-02 DIAGNOSIS — R269 Unspecified abnormalities of gait and mobility: Secondary | ICD-10-CM

## 2017-08-02 DIAGNOSIS — M25552 Pain in left hip: Secondary | ICD-10-CM

## 2017-08-02 DIAGNOSIS — R2689 Other abnormalities of gait and mobility: Secondary | ICD-10-CM

## 2017-08-02 NOTE — Therapy (Signed)
[place] [facility] [address] [place], [place], [address] Phone: [phone]   Fax:  [phone]  Physical Therapy Treatment  Patient Details  Name: Eric Torres MRN: [id number] Date of Birth: [birth year] Referring Provider: Dr. Bobette Mo  Encounter Date: 08/02/2017      PT End of Session - 08/02/17 1644    Visit Number 8   Number of Visits 9   Date for PT Re-Evaluation 08/09/17   Authorization - Visit Number 8   Authorization - Number of Visits 14   PT Start Time 3419   PT Stop Time 3790   PT Time Calculation (min) 46 min   Equipment Utilized During Treatment Gait belt   Activity Tolerance Patient tolerated treatment well;No increased pain   Behavior During Therapy WFL for tasks assessed/performed      Past Medical History:  Diagnosis Date  . BPH (benign prostatic hyperplasia)   . 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 COPD (chronic obstructive pulmonary disease) (HCC)    MILD-NO INHALERS PER PT  . Essential tremor   . Heart murmur   . History of brachytherapy   . History of TIA (transient ischemic attack) 2015   LEFT THUMB AND INDEX FINGER-SOMETIMES I DROP THINGS  . Hypercholesteremia   . Hypertension   . Hypothyroidism     Past Surgical History:  Procedure Laterality Date  . CHOLECYSTECTOMY    . CYSTOSCOPY WITH BIOPSY N/A 12/29/2015   Procedure: CYSTOSCOPY WITH BIOPSY;  Surgeon: Royston Cowper, MD;  Location: [facility] ORS;  Service: Urology;  Laterality: N/A;  . EXPLORATORY LAPAROTOMY  1991   VOLVULUS AND BOWEL OBSTRUCTION  . ILIAC ARTERY STENT Bilateral   . TONSILLECTOMY    . TRANSURETHRAL RESECTION OF BLADDER TUMOR N/A 12/29/2015   Procedure: TRANSURETHRAL RESECTION OF BLADDER TUMOR (TURBT)/MITOMYCIN INSTILLATION;  Surgeon: Royston Cowper, MD;  Location: [facility] ORS;  Service: Urology;  Laterality: N/A;  . TRANSURETHRAL RESECTION OF BLADDER TUMOR WITH GYRUS (TURBT-GYRUS)  2013  . VASECTOMY      There were no vitals  filed for this visit.      Subjective Assessment - 08/02/17 0959    Subjective Pt reports he had a near fall in his driveway walking with only socks on when his foot slid in his sock. He also reports some anxiety about descending stairs due to foot catching on edge of step when walking forward down the stairs requiring pt to sidestep   Limitations Lifting;Standing;Walking;House hold activities   Patient Stated Goals Improve walking/ balance to prevent falls.  Decrease c/o B hip pain with prolonged walking.     Currently in Pain? No/denies   Pain Onset More than a month ago     Treatment:  Warm-up SciFit L6 x 6 min with BUE/BLE  Ther-ex:    Standing in // bars: Hip abduction x 10 each LE resisted with green t-band Zig-zag walk resisted with green t-band x 6 laps with min UE assist (pt had slight tension in back after 4 laps that resolved in sitting)     Neuro: Standing in // bars:  Step-ups/downs to 4" foam x 10 with min UE assist  Step down from 5" step with single UE assist x 20 leading with each  LE Lunges to BOSU ball with min UE assist x 15 each LE forward  Alternating with min UE assist   Gait:  Stair training x 10 sets of 4 steps with CGA; up/down 6" steps  with single UE assist using reciprocal step pattern. Pt has decreased eccentric control with step down and frequent catching of heel on edge of step with forward LOB and/or compensatory posterior weight shift causing occasional posterior LOB. Pt had improved stability following instruction for step-to pattern to descend steps.       PT Education - 08/02/17 1644    Education provided Yes   Education Details Gait training on steps, eccentric step control with   Person(s) Educated Patient   Methods Explanation;Demonstration;Tactile cues;Verbal cues   Comprehension Verbal cues required;Returned demonstration;Verbalized understanding;Tactile cues required             PT Long Term Goals - 07/12/17 1759      PT LONG  TERM GOAL #1   Title Pt. independent with HEP to improve pain-free B hip pain with gym based/ aquatic ex. program to improve mobility.    Baseline Aquatic ex. 2x/week and HEP   Time 4   Period Weeks   Status Achieved   Target Date 08/09/17     PT LONG TERM GOAL #2   Title Pt. will increase Berg balance test to >53 out of 56 to decrease fall risk/ improve balance.    Baseline Berg: 51/56 on 9/6.  Berg: 52/56 (small improvement)   Time 4   Period Weeks   Status Partially Met   Target Date 08/09/17     PT LONG TERM GOAL #3   Title Pt. will increase LEFS to >45 out of 80 to improve funcitonal mobility.     Baseline LEFS: 38 out of 80 on eval.  LEFS: 50 out of 80 on 07/12/17   Time 4   Period Weeks   Status Achieved   Target Date 08/09/17     PT LONG TERM GOAL #4   Title Pt. able to ambulate 10 minutes on outside surfaces with normalized gait pattern and no c/o B hip pain.     Baseline increase hip pain with distance walked.    Time 4   Period Weeks   Status Not Met   Target Date 08/09/17               Plan - 08/02/17 1654    Clinical Impression Statement Pt was able to climb up/down 6" steps today with Single UE assist using reciprocal step pattern. Pt has decreased eccentric control with step down and frequent catching of heel on edge of step with forward LOB and/or compensatory posterior weight shift causing occasional posterior LOB. Pt had improved stability following instruction for step-to pattern to descend steps. Plan to continue training eccentric control with step-down.   Clinical Presentation Stable   Clinical Decision Making Moderate   Rehab Potential Good   PT Frequency 1x / week   PT Duration 4 weeks   PT Treatment/Interventions ADLs/Self Care Home Management;Gait training;Stair training;Functional mobility training;Therapeutic activities;Therapeutic exercise;Balance training;Neuromuscular re-education;Patient/family education;Manual techniques   PT Next Visit  Plan Hip/LE stretching (contract-relax), consider resisted walking with Nautilus, continue dynamic balance focus on knee/ankle control, re-assess stairs/ eccentric step-down.   PT Home Exercise Plan Added sit<>stand/ squats, Continued 4-way hip   Consulted and Agree with Plan of Care Patient      Patient will benefit from skilled therapeutic intervention in order to improve the following deficits and impairments:  Abnormal gait, Decreased mobility, Decreased strength, Decreased range of motion, Decreased endurance, Decreased activity tolerance, Decreased balance, Difficulty walking  Visit Diagnosis: Pain in right hip  Pain in left hip  Gait  difficulty  Impairment of balance     Problem List There are no active problems to display for this patient.  Burnett Corrente, SPT  08/02/2017, 5:06 PM   This entire session was performed under supervision and direction of a licensed therapist/therapist assistant . I have personally read, edited and approve of the note as written. Collie Siad PT, DPT  [facility] [address] [place], [place], [address] Phone: [phone]   Fax:  [phone]  Name: ESSAM LOWDERMILK MRN: [id number] Date of Birth: [birth year]

## 2017-08-09 ENCOUNTER — Encounter: Payer: Self-pay | Admitting: Physical Therapy

## 2017-08-09 ENCOUNTER — Ambulatory Visit: Payer: Medicare Other | Admitting: Physical Therapy

## 2017-08-09 DIAGNOSIS — M25552 Pain in left hip: Secondary | ICD-10-CM

## 2017-08-09 DIAGNOSIS — M25551 Pain in right hip: Secondary | ICD-10-CM | POA: Diagnosis not present

## 2017-08-09 DIAGNOSIS — R2689 Other abnormalities of gait and mobility: Secondary | ICD-10-CM

## 2017-08-09 DIAGNOSIS — R269 Unspecified abnormalities of gait and mobility: Secondary | ICD-10-CM

## 2017-08-09 NOTE — Therapy (Signed)
[facility] [address] [place], [place], [address] Phone: [phone]   Fax:  [phone]  Physical Therapy Treatment/ Progress Note  Patient Details  Name: Eric Torres MRN: [id number] Date of Birth: 03-23-1932 Referring Provider: Dr. Bobette Mo  Encounter Date: 08/09/2017      PT End of Session - 08/09/17 1040    Visit Number 9   Number of Visits 9   Date for PT Re-Evaluation 08/09/17   Authorization - Visit Number 9   Authorization - Number of Visits 14   PT Start Time 2637   PT Stop Time 8588   PT Time Calculation (min) 56 min   Equipment Utilized During Treatment Gait belt   Activity Tolerance Patient tolerated treatment well;No increased pain   Behavior During Therapy WFL for tasks assessed/performed      Past Medical History:  Diagnosis Date  . BPH (benign prostatic hyperplasia)   . 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 COPD (chronic obstructive pulmonary disease) (HCC)    MILD-NO INHALERS PER PT  . Essential tremor   . Heart murmur   . History of brachytherapy   . History of TIA (transient ischemic attack) 2015   LEFT THUMB AND INDEX FINGER-SOMETIMES I DROP THINGS  . Hypercholesteremia   . Hypertension   . Hypothyroidism     Past Surgical History:  Procedure Laterality Date  . CHOLECYSTECTOMY    . CYSTOSCOPY WITH BIOPSY N/A 12/29/2015   Procedure: CYSTOSCOPY WITH BIOPSY;  Surgeon: Royston Cowper, MD;  Location: [facility] ORS;  Service: Urology;  Laterality: N/A;  . EXPLORATORY LAPAROTOMY  1991   VOLVULUS AND BOWEL OBSTRUCTION  . ILIAC ARTERY STENT Bilateral   . TONSILLECTOMY    . TRANSURETHRAL RESECTION OF BLADDER TUMOR N/A 12/29/2015   Procedure: TRANSURETHRAL RESECTION OF BLADDER TUMOR (TURBT)/MITOMYCIN INSTILLATION;  Surgeon: Royston Cowper, MD;  Location: [facility] ORS;  Service: Urology;  Laterality: N/A;  . TRANSURETHRAL RESECTION OF BLADDER TUMOR WITH GYRUS (TURBT-GYRUS)  2013  . VASECTOMY      There  were no vitals filed for this visit.      Subjective Assessment - 08/09/17 0944    Subjective Pt reports no change since last week. He is having some pain with his urethra clamp; currently he is wearing the clamp 4-5 hours per day. No falls, no pain currently.    Limitations Lifting;Standing;Walking;House hold activities   Patient Stated Goals Improve walking/ balance to prevent falls.  Decrease c/o B hip pain with prolonged walking.     Currently in Pain? No/denies   Pain Onset More than a month ago       Treatment:   Manual therapy:   Supine  Hamstring stretch stretch using muscle energy technique for contract-relax stretch, 5 sec contract and 20 sec relax x 3 iterations each side.  Nerve glide 2 x 10 each side HEP updated     Ther-ex:    Standing in // bars: Mini-squats to chair re-instructed 2 x 10 with occasional LOB posteriorly with safe descent into chair Lateral step-ups/downs 5" step x 10 each LE Step backs with 5" step x 10 each LE   Neuro: Standing in // bars: Lunges to BOSU ball with min UE assist each LE forward alternating x 20 Nautilus:  Resisted gait x 3 laps in each direction; pt staggered several times with eccentric return laterally to the R requiring minA to recover balance  Cool-down SciFit L6.5 x 10 min  with BUE/BLE (un-billed)        PT Education - 08/09/17 1040    Education provided Yes   Education Details Nerve glides in sitting, resisted gait   Person(s) Educated Patient   Methods Explanation;Demonstration;Verbal cues;Tactile cues;Handout   Comprehension Verbalized understanding;Returned demonstration;Verbal cues required;Tactile cues required             PT Long Term Goals - 08/09/17 1042      PT LONG TERM GOAL #1   Title Pt. independent with HEP to improve pain-free B hip pain with gym based/ aquatic ex. program to improve mobility.    Baseline Aquatic ex. 2x/week and HEP   Time 4   Period Weeks   Status Achieved     PT LONG  TERM GOAL #2   Title Pt. will increase Berg balance test to >53 out of 56 to decrease fall risk/ improve balance.    Baseline Berg: 51/56 on 9/6.  Berg: 52/56 (small improvement)   Time 4   Period Weeks   Status Partially Met     PT LONG TERM GOAL #3   Title Pt. will increase LEFS to >60 out of 80 to improve funcitonal mobility.     Baseline LEFS: 38 out of 80 on eval.  LEFS: 50 out of 80 on 07/12/17   Time 4   Period Weeks   Status Revised   Target Date 09/06/17     PT LONG TERM GOAL #4   Title Pt. able to ambulate 10 minutes on outside surfaces with normalized gait pattern and no c/o B hip pain.     Baseline Pt able to walk about 8 minutes before getting hip pain   Time 4   Period Weeks   Status Partially Met   Target Date 09/06/17     PT LONG TERM GOAL #5   Title Pt will be able to walk 594mwithout pain in order to walk around the block at home.    Baseline Pain halfway around block   Time 4   Period Weeks   Status New   Target Date 09/06/17     Additional Long Term Goals   Additional Long Term Goals Yes     PT LONG TERM GOAL #6   Title Pt able to descend 12 steps with single UE assist with no LOB or heel catching on step in order to improve community access and safety on steps.   Baseline frequent LOB and heel catching on step   Time 4   Period Weeks   Status New   Target Date 09/06/17               Plan - 08/09/17 1051    Clinical Impression Statement Pt feels that he is improving, but he has not fully met his goals for therapy. He wants to be able to walk 5059mithout pain in order to walk around the block at home. Pt continues to have limitations with dynamic balance and eccentric control with descending stairs. Goals updated with plan to continue dynamic balance and strength training.    Clinical Presentation Stable   Clinical Decision Making Moderate   Rehab Potential Good   PT Frequency 1x / week   PT Duration 4 weeks   PT Treatment/Interventions  ADLs/Self Care Home Management;Gait training;Stair training;Functional mobility training;Therapeutic activities;Therapeutic exercise;Balance training;Neuromuscular re-education;Patient/family education;Manual techniques   PT Next Visit Plan RECERT NEXT TX SESSION.   Resisted gait, Hip/LE stretching (contract-relax) in 90/90 position, consider resisted walking  with Nautilus, continue dynamic balance focus on knee/ankle control, re-assess stairs/ eccentric step-down.   PT Home Exercise Plan Added nerve flossing, continued sit<>stand/ squats, 4-way hip   Consulted and Agree with Plan of Care Patient      Patient will benefit from skilled therapeutic intervention in order to improve the following deficits and impairments:  Abnormal gait, Decreased mobility, Decreased strength, Decreased range of motion, Decreased endurance, Decreased activity tolerance, Decreased balance, Difficulty walking  Visit Diagnosis: Pain in right hip  Pain in left hip  Gait difficulty  Impairment of balance     Problem List There are no active problems to display for this patient.  Miray Mancino Lenis Dickinson, SPT Pura Spice, PT, DPT # [phone] 08/09/2017, 11:36 AM  [place] [facility] [address] [place], [place], [address] Phone: [phone]   Fax:  [phone]  Name: GAYLAN FAUVER MRN: [id number] Date of Birth: [birth year]

## 2017-08-16 ENCOUNTER — Ambulatory Visit: Payer: Medicare Other | Admitting: Physical Therapy

## 2017-08-17 ENCOUNTER — Encounter: Payer: Self-pay | Admitting: Physical Therapy

## 2017-08-17 ENCOUNTER — Ambulatory Visit: Payer: Medicare Other | Attending: Family Medicine | Admitting: Physical Therapy

## 2017-08-17 DIAGNOSIS — R269 Unspecified abnormalities of gait and mobility: Secondary | ICD-10-CM | POA: Diagnosis present

## 2017-08-17 DIAGNOSIS — M25551 Pain in right hip: Secondary | ICD-10-CM | POA: Insufficient documentation

## 2017-08-17 DIAGNOSIS — M25552 Pain in left hip: Secondary | ICD-10-CM | POA: Insufficient documentation

## 2017-08-17 DIAGNOSIS — R2689 Other abnormalities of gait and mobility: Secondary | ICD-10-CM

## 2017-08-17 NOTE — Therapy (Deleted)
[facility] [address] [place], [place], [address] Phone: [phone]   Fax:  [phone]  August 17, 2017   @CCLISTADDRESS @  Physical Therapy Discharge Summary  Patient: Eric Torres  MRN: [id number]  Date of Birth: 1932/08/26   Diagnosis: No diagnosis found. Referring Provider: Dr. Bobette Mo   The above patient had been seen in Physical Therapy *** times of *** treatments scheduled with *** no shows and *** cancellations.  The treatment consisted of *** The patient is: {improved/worse/unchanged:3041574}  Subjective: ***  Discharge Findings: ***  Functional Status at Discharge: ***  {ONGEX:5284132}    Sincerely,   Virl Axe, Student-PT   CC @CCLISTRESTNAME @  [facility] [address] [place], [place], [address] Phone: [phone]   Fax:  [phone]  Patient: Eric Torres  MRN: [id number]  Date of Birth: 07-11-1932

## 2017-08-17 NOTE — Therapy (Signed)
[place] [facility] [address] [place], [place], [address] Phone: [phone]   Fax:  [phone]  Physical Therapy Treatment/ Discharge Summary  Patient Details  Name: Eric Torres MRN: [id number] Date of Birth: July 12, 1932 Referring Provider: Dr. Bobette Mo   Encounter Date: 08/17/2017  PT End of Session - 08/17/17 1441    Visit Number  10    Number of Visits  10    Date for PT Re-Evaluation  08/17/17    Authorization - Visit Number  10    Authorization - Number of Visits  14    PT Start Time  4128    PT Stop Time  1107    PT Time Calculation (min)  53 min    Equipment Utilized During Treatment  Gait belt    Activity Tolerance  Patient tolerated treatment well;No increased pain    Behavior During Therapy  WFL for tasks assessed/performed       Past Medical History:  Diagnosis Date  . BPH (benign prostatic hyperplasia)   . 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 COPD (chronic obstructive pulmonary disease) (HCC)    MILD-NO INHALERS PER PT  . Essential tremor   . Heart murmur   . History of brachytherapy   . History of TIA (transient ischemic attack) 2015   LEFT THUMB AND INDEX FINGER-SOMETIMES I DROP THINGS  . Hypercholesteremia   . Hypertension   . Hypothyroidism     Past Surgical History:  Procedure Laterality Date  . CHOLECYSTECTOMY    . EXPLORATORY LAPAROTOMY  1991   VOLVULUS AND BOWEL OBSTRUCTION  . ILIAC ARTERY STENT Bilateral   . TONSILLECTOMY    . TRANSURETHRAL RESECTION OF BLADDER TUMOR WITH GYRUS (TURBT-GYRUS)  2013  . VASECTOMY      There were no vitals filed for this visit.  Subjective Assessment - 08/17/17 1023    Subjective  Pt reports he saw his gastroenterologist yesterday and is trying a new medication. Pt reports no other changes and no pain at this time.    Limitations  Lifting;Standing;Walking;House hold activities    Patient Stated Goals  Improve walking/ balance to prevent falls.  Decrease c/o B  hip pain with prolonged walking.      Currently in Pain?  No/denies    Pain Onset  More than a month ago        Warm-up x 10 min on [employer] (unbilled) BUE/BLE L6  Treatment:   Goals re-assessed:     Merrilee Jansky >53/56: Berg: 51/56 on 9/6.  Berg: 52/56;  On approx 10/6;   56/56 on 08/17/17    LEFS >60: LEFS: 50 out of 80 on 07/12/17     Neuro Re-education: Standing in // bars:    Static stance on BOSU ball 2 x 30 sec with no UE assist; pt with posterior LOB x 2-3    Ankle rocking x 15 with 2-3 sec hold in DF/PF on BOSU ball with no UE assist; pt with      multiple LOB, but stability improved in-session with stance up to 15 sec with no LOB Mini-squats to chair re-instructed 2 x 10 with only one minor LOB posteriorly with safe        descent into chair Lateral step-ups/downs 5" step x 10 each LE Step backs with 5" step x 10 each LE Standing in // bars: Lunges to BOSU ball with min UE assist each LE forward alternating x 20 Nautilus:  Resisted gait x 3 laps in each direction; pt staggered several times with eccentric return laterally to the R requiring minA to recover balance       PT Education - 08/17/17 1439    Education provided  Yes    Education Details  HEP discussed, plan of care    Person(s) Educated  Patient    Methods  Explanation;Demonstration;Verbal cues;Tactile cues    Comprehension  Returned demonstration;Verbalized understanding;Tactile cues required;Verbal cues required          PT Long Term Goals - 08/17/17 1100      PT LONG TERM GOAL #1   Title  Pt. independent with HEP to improve pain-free B hip pain with gym based/ aquatic ex. program to improve mobility.     Baseline  Aquatic ex. 2x/week and HEP    Time  4    Period  Weeks    Status  Achieved    Target Date  08/09/17      PT LONG TERM GOAL #2   Title  Pt. will increase Berg balance test to >53 out of 56 to decrease fall risk/ improve balance.     Baseline  Berg: 51/56 on 9/6.  Berg: 52/56  (small improvement) 56/56 on 08/17/17    Time  4    Period  Weeks    Status  Achieved    Target Date  08/09/17      PT LONG TERM GOAL #3   Title  Pt. will increase LEFS to >60 out of 80 to improve funcitonal mobility.      Baseline  LEFS: 38 out of 80 on eval.  LEFS: 50 out of 80 on 07/12/17;     LEFS36/80 (pt does not feel that he is getting worse)    Time  4    Period  Weeks    Status  Not Met    Target Date  08/09/17      PT LONG TERM GOAL #4   Title  Pt. able to ambulate 10 minutes on outside surfaces with normalized gait pattern and no c/o B hip pain.      Baseline  Pt able to walk 3 block with no increase in pain    Time  4    Period  Weeks    Status  Achieved    Target Date  08/09/17      PT LONG TERM GOAL #5   Title  Pt will be able to walk 545mwithout pain in order to walk around the block at home.     Baseline  Pt able to walk 3 block with no increase in pain    Time  4    Period  Weeks    Status  Achieved    Target Date  08/09/17      PT LONG TERM GOAL #6   Title  Pt able to descend 12 steps with single UE assist with no LOB or heel catching on step in order to improve community access and safety on steps.    Baseline  No LOB and occasional heel catching on step    Time  4    Period  Weeks    Status  Partially Met    Target Date  08/09/17            Plan - 08/17/17 1451    Clinical Impression Statement  Pt is appropriate for discharge at this time. Goals re-assessed: pt has met his Berg balance goal with  56/56. LEFS goal not met, though pt reports that he believes his LE function has improved. Pt had confusion answering several of the questions; discrepancy likely due to pt inconsistency. Pt has met all other goals except for stair goal. Pt is independent with HEP and aquatic therapy program at this time and will be able to make further progress towards his goals independently. Pt instructed to contact therapist if he has and increase in symptoms.       Clinical Presentation  Stable    Clinical Decision Making  Moderate    Rehab Potential  Good    PT Frequency  1x / week    PT Duration  4 weeks    PT Treatment/Interventions  ADLs/Self Care Home Management;Gait training;Stair training;Functional mobility training;Therapeutic activities;Therapeutic exercise;Balance training;Neuromuscular re-education;Patient/family education;Manual techniques    PT Next Visit Plan  D/C    PT Home Exercise Plan  Continued nerve flossing, continued sit<>stand/ squats, 4-way hip    Consulted and Agree with Plan of Care  Patient       Patient will benefit from skilled therapeutic intervention in order to improve the following deficits and impairments:  Abnormal gait, Decreased mobility, Decreased strength, Decreased range of motion, Decreased endurance, Decreased activity tolerance, Decreased balance, Difficulty walking  Visit Diagnosis: Pain in right hip  Pain in left hip  Gait difficulty  Impairment of balance   G-Codes - 09-06-17 1414    Functional Assessment Tool Used (Outpatient Only)  Berg/ LEFS/ Clinical impression/ B hip pain/ gait difficulty    Functional Limitation  Mobility: Walking and moving around    Mobility: Walking and Moving Around Current Status [phone])  At least 1 percent but less than 20 percent impaired, limited or restricted    Mobility: Walking and Moving Around Goal Status [phone]  At least 1 percent but less than 20 percent impaired, limited or restricted    Mobility: Walking and Moving Around Discharge Status [phone])  At least 1 percent but less than 20 percent impaired, limited or restricted       Problem List There are no active problems to display for this patient.  Shabria Egley Lenis Dickinson, SPT Pura Spice, PT, DPT # [phone] 08/18/2017, 2:15 PM  [facility] [address] [place], [place], [address] Phone: [phone]   Fax:  [phone]  Name: Eric Torres MRN:  [id number] Date of Birth: 05/13/1932

## 2018-06-04 ENCOUNTER — Ambulatory Visit
Admission: RE | Admit: 2018-06-04 | Discharge: 2018-06-04 | Disposition: A | Discharge status: Home / Self Care | Payer: Medicare Other | Source: Ambulatory Visit | Attending: Family Medicine | Admitting: Family Medicine

## 2018-06-04 ENCOUNTER — Other Ambulatory Visit: Payer: Self-pay | Admitting: Family Medicine

## 2018-06-04 DIAGNOSIS — R52 Pain, unspecified: Secondary | ICD-10-CM

## 2018-06-04 DIAGNOSIS — M545 Low back pain: Secondary | ICD-10-CM | POA: Insufficient documentation

## 2018-06-04 DIAGNOSIS — G8929 Other chronic pain: Secondary | ICD-10-CM | POA: Diagnosis present

## 2018-07-09 ENCOUNTER — Ambulatory Visit: Payer: Medicare Other | Attending: Family Medicine | Admitting: Physical Therapy

## 2018-07-09 ENCOUNTER — Other Ambulatory Visit: Payer: Self-pay

## 2018-07-09 VITALS — BP 130/50 | HR 56

## 2018-07-09 DIAGNOSIS — M6281 Muscle weakness (generalized): Secondary | ICD-10-CM | POA: Insufficient documentation

## 2018-07-09 DIAGNOSIS — R2681 Unsteadiness on feet: Secondary | ICD-10-CM | POA: Insufficient documentation

## 2018-07-09 DIAGNOSIS — R2689 Other abnormalities of gait and mobility: Secondary | ICD-10-CM | POA: Diagnosis present

## 2018-07-09 NOTE — Therapy (Cosign Needed Addendum)
[place] [facility] SERVICES [address] [place], [place], [address] Phone: [phone]   Fax:  [phone]  Physical Therapy Evaluation  Patient Details  Name: Eric Torres MRN: [id number] Date of Birth: Nov 15, 1931 Referring Provider (PT): Mcneil Sober   Encounter Date: 07/09/2018  PT End of Session - 07/10/18 0829    Visit Number  1    Number of Visits  13    Date for PT Re-Evaluation  08/20/18    Authorization Type  1/10 (PN begins 07/09/18)    PT Start Time  1515    PT Stop Time  1605    PT Time Calculation (min)  50 min    Equipment Utilized During Treatment  Gait belt    Activity Tolerance  Patient tolerated treatment well    Behavior During Therapy  WFL for tasks assessed/performed       Past Medical History:  Diagnosis Date  . BPH (benign prostatic hyperplasia)   . 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 COPD (chronic obstructive pulmonary disease) (HCC)    MILD-NO INHALERS PER PT  . Essential tremor   . Heart murmur   . History of brachytherapy   . History of TIA (transient ischemic attack) 2015   LEFT THUMB AND INDEX FINGER-SOMETIMES I DROP THINGS  . Hypercholesteremia   . Hypertension   . Hypothyroidism     Past Surgical History:  Procedure Laterality Date  . CHOLECYSTECTOMY    . CYSTOSCOPY WITH BIOPSY N/A 12/29/2015   Procedure: CYSTOSCOPY WITH BIOPSY;  Surgeon: Royston Cowper, MD;  Location: [facility] ORS;  Service: Urology;  Laterality: N/A;  . EXPLORATORY LAPAROTOMY  1991   VOLVULUS AND BOWEL OBSTRUCTION  . ILIAC ARTERY STENT Bilateral   . TONSILLECTOMY    . TRANSURETHRAL RESECTION OF BLADDER TUMOR N/A 12/29/2015   Procedure: TRANSURETHRAL RESECTION OF BLADDER TUMOR (TURBT)/MITOMYCIN INSTILLATION;  Surgeon: Royston Cowper, MD;  Location: [facility] ORS;  Service: Urology;  Laterality: N/A;  . TRANSURETHRAL RESECTION OF BLADDER TUMOR WITH GYRUS (TURBT-GYRUS)  2013  . VASECTOMY      Vitals:   07/10/18 0817  BP:  (!) 130/50  Pulse: (!) 56     Subjective Assessment - 07/09/18 1533    Subjective  Patient is a pleasant 84 year old male who states his primary reason for PT is his balance. States he needs support to go up and down stairs. States he wobbled approximately 4 times walking into clinic.    Pertinent History  Patient reports his main issue is his balance. States he has a new doctor that refered him to this clinic instead of outpatient in [facility] where he has received physical therapy prior. Has received therapy for balance in the past (approximately 1 year ago). States he has fallen twice recently when he tripped over leg lift of recliner (6 weeks ago), and fell carrying groceries while trying to open door (4 weeks ago). Reports he does have a fear of falling while walking. States he has circulation issues in his legs that has returned and has not told doctor. Completes water aerobics 2-3x a week. Patient states he also has shoulder pain and back pain, and that shoulder hurts is when he raises arm in abduction against resistance and when rolling over in bed. Used to enjoy playing golf. Would like to be able to walk around the block with his wife.     Limitations  Standing;Walking;House hold activities    How  long can you stand comfortably?  5 minutes    How long can you walk comfortably?  5 minutes     Patient Stated Goals  walk around block with wife, prevent falls,     Currently in Pain?  No/denies         [facility] PT Assessment - 07/09/18 1518      Assessment   Medical Diagnosis  Balance   LBP; L shoulder pain    Referring Provider (PT)  Mcneil Sober    Onset Date/Surgical Date  10/10/16    Hand Dominance  Right    Next MD Visit  November 2019    Prior Therapy  yes      Precautions   Precautions  Fall;None      Balance Screen   Has the patient fallen in the past 6 months  Yes    How many times?  2    Has the patient had a decrease in activity level because of a fear of falling?   No     Is the patient reluctant to leave their home because of a fear of falling?   No      Home Social worker  Private residence    Living Arrangements  Spouse/significant other;Children    Available Help at Discharge  Family    Type of Between to enter    Entrance Stairs-Number of Steps  1    Entrance Stairs-Rails  None    Home Layout  Two level    [place]  None      Prior Function   Level of Independence  Independent with basic ADLs    Vocation  Retired      Associate Professor   Overall Cognitive Status  Within Functional Limits for tasks assessed      Standardized Balance Assessment   Standardized Balance Assessment  Berg Balance Test      Berg Balance Test   Sit to Stand  Able to stand without using hands and stabilize independently    Standing Unsupported  Able to stand safely 2 minutes    Sitting with Back Unsupported but Feet Supported on Floor or Stool  Able to sit safely and securely 2 minutes    Stand to Sit  Sits safely with minimal use of hands    Transfers  Able to transfer safely, minor use of hands    Standing Unsupported with Eyes Closed  Able to stand 10 seconds safely    Standing Ubsupported with Feet Together  Able to place feet together independently and stand 1 minute safely    From Standing, Reach Forward with Outstretched Arm  Can reach forward >12 cm safely (5")    From Standing Position, Pick up Object from Floor  Able to pick up shoe, needs supervision    From Standing Position, Turn to Look Behind Over each Shoulder  Looks behind one side only/other side shows less weight shift    Turn 360 Degrees  Able to turn 360 degrees safely one side only in 4 seconds or less    Standing Unsupported, Alternately Place Feet on Step/Stool  Able to stand independently and complete 8 steps >20 seconds    Standing Unsupported, One Foot in Front  Able to take small step independently and hold 30 seconds    Standing on One Leg   Able to lift leg independently and hold equal to or more than 3 seconds  Total Score  47                Objective measurements completed on examination: See above findings.   Vitals:BP:130/30mmHg HR: 56bpm   PAIN: 0/10 currently at rest   STRENGTH:  Graded on a 0-5 scale Muscle Group Left Right  Hip Flex 4/5 4/5  Hip Abd 4/5 4/5  Hip Add 4+/5 4+/5  Knee Flex 4+/5 4+/5  Knee Ext 4/5 4/5  Ankle DF 4/5 4/5  Ankle PF 4/5 4/5   SENSATION:  Decreased sensation along R L4 dermatome pattern    BALANCE: Observed LOB with vertical head turns without UE support at end of session  Static Sitting Balance  Normal Able to maintain balance against maximal resistance   Good Able to maintain balance against moderate resistance x  Good-/Fair+ Accepts minimal resistance   Fair Able to sit unsupported without balance loss and without UE support   Poor+ Able to maintain with Minimal assistance from individual or chair   Poor Unable to maintain balance-requires mod/max support from individual or chair    Static Standing Balance  Normal Able to maintain standing balance against maximal resistance   Good Able to maintain standing balance against moderate resistance x  Good-/Fair+ Able to maintain standing balance against minimal resistance   Fair Able to stand unsupported without UE support and without LOB for 1-2 min   Fair- Requires Min A and UE support to maintain standing without loss of balance   Poor+ Requires mod A and UE support to maintain standing without loss of balance   Poor Requires max A and UE support to maintain standing balance without loss    Dynamic Sitting Balance  Normal Able to sit unsupported and weight shift across midline maximally   Good Able to sit unsupported and weight shift across midline moderately x  Good-/Fair+ Able to sit unsupported and weight shift across midline minimally   Fair Minimal weight shifting ipsilateral/front, difficulty crossing  midline   Fair- Reach to ipsilateral side and unable to weight shift   Poor + Able to sit unsupported with min A and reach to ipsilateral side, unable to weight shift   Poor Able to sit unsupported with mod A and reach ipsilateral/front-can't cross midline    Standing Dynamic Balance  Normal Stand independently unsupported, able to weight shift and cross midline maximally   Good Stand independently unsupported, able to weight shift and cross midline moderately   Good-/Fair+ Stand independently unsupported, able to weight shift across midline minimally x  Fair Stand independently unsupported, weight shift, and reach ipsilaterally, loss of balance when crossing midline   Poor+ Able to stand with Min A and reach ipsilaterally, unable to weight shift   Poor Able to stand with Mod A and minimally reach ipsilaterally, unable to cross midline.     GAIT: Ambulates without AD. Decreased bilateral terminal hip extension.   OUTCOME MEASURES: TEST Outcome Interpretation  5 times sit<>stand 13sec with hands on knees >60 yo, >15 sec indicates increased risk for falls  10 meter walk test              0.833   m/s <1.0 m/s indicates increased risk for falls; limited community ambulator  Berg Balance Assessment 47/56 <36/56 (100% risk for falls), 37-45 (80% risk for falls); 46-51 (>50% risk for falls);    ABC scale: 86.25%   This patient presents with 3, personal factors/ comorbidities, and  4  body elements including body structures and functions,  activity limitations and or participation restrictions. Patient's condition is stable.           PT Education - 07/10/18 0821    Education Details  balance safety, communitcating with Dr    Terence Lux) Educated  Patient    Methods  Explanation;Demonstration;Verbal cues    Comprehension  Verbalized understanding;Returned demonstration;Need further instruction       PT Short Term Goals - 07/10/18 0807      PT SHORT TERM GOAL #1   Title  Patient  will be independent with completion of HEP to improve ability to complete functional tasks.    Time  2    Period  Weeks    Status  New    Target Date  07/24/18      PT SHORT TERM GOAL #2   Title  Patient will report no falls in the next two weeks to demonstrate improved safety and fall risk.     Baseline  07/09/18: fell 2x in 6 weeks    Time  2    Period  Weeks    Status  New    Target Date  07/24/18        PT Long Term Goals - 07/10/18 0810      PT LONG TERM GOAL #1   Title  Patient will improve 5x sit to stand to <10seconds without UE support to demonstrate improvements in functional strength.     Baseline  07/09/18: 13sec hands on knees    Time  6    Period  Weeks    Status  New    Target Date  08/21/18      PT LONG TERM GOAL #2   Title  Patient will increase BERG balance score to >52/56 to decrease fall risk and improve balance.     Baseline  07/09/18: 47/56    Time  6    Period  Weeks    Status  New    Target Date  08/21/18      PT LONG TERM GOAL #3   Title  Patient will be able to walk with wife around neighborhood (>29minutes) to demonstrate improved balance and community ambulation.     Baseline  07/09/18: 77minutes    Time  6    Period  Weeks    Status  New    Target Date  08/21/18      PT LONG TERM GOAL #4   Title  Patient will improve 51mwt to >1.35m/s limiting fall risk and improve community ambulation.     Baseline  07/09/18: 0.841m/s    Time  6    Period  Weeks    Status  New    Target Date  08/21/18             Plan - 07/10/18 0757    Clinical Impression Statement  Patient presents to physical therapy services with decreased balance and gait instability. Patient has decreased strength, decreased BERG balance score and 61mwt indicating possible fall risk. Patient does not currently use an assistive device while ambulating. Patient will benefit from skilled physical therapy to improve strength, balance, and functional mobility to decrease fall risk  and improve community ambulation.     History and Personal Factors relevant to plan of care:  This patient presents with 3, personal factors/ comorbidities, and  4  body elements including body structures and functions, activity limitations and or participation restrictions. Patient's condition is stable.    Clinical Presentation  Stable  Clinical Presentation due to:  current level of function    Clinical Decision Making  Low    Rehab Potential  Good    Clinical Impairments Affecting Rehab Potential  (+) support system (-) age;     PT Frequency  2x / week    PT Duration  6 weeks    PT Treatment/Interventions  ADLs/Self Care Home Management;Energy conservation;Patient/family education;Therapeutic exercise;Balance training;Neuromuscular re-education;Therapeutic activities;Functional mobility training;Stair training;DME Instruction;Gait training;Aquatic Therapy;Cryotherapy;Electrical Stimulation;Iontophoresis 4mg /ml Dexamethasone;Moist Heat;Traction;Ultrasound;Taping;Passive range of motion;Vestibular;Manual techniques    PT Next Visit Plan  HEP, 18mwt, assess shoulder    Consulted and Agree with Plan of Care  Patient       Patient will benefit from skilled therapeutic intervention in order to improve the following deficits and impairments:  Abnormal gait, Decreased activity tolerance, Decreased balance, Decreased coordination, Decreased endurance, Decreased mobility, Decreased strength, Difficulty walking, Impaired sensation, Pain, Impaired UE functional use, Postural dysfunction, Improper body mechanics, Impaired perceived functional ability, Decreased knowledge of use of DME  Visit Diagnosis: Muscle weakness (generalized)  Unsteady gait  Loss of balance     Problem List There are no active problems to display for this patient.  Eric Torres, SPT 07/10/2018, 8:35 AM   This entire session was performed under direct supervision and direction of a licensed therapist/therapist assistant  . I have personally read, edited and approve of the note as written. Collie Siad PT, [place] [facility] SERVICES [address] [place], [place], [address] Phone: [phone]   Fax:  [phone]  Name: Eric Torres MRN: [id number] Date of Birth: 08/15/1932

## 2018-07-10 ENCOUNTER — Encounter: Payer: Self-pay | Admitting: Physical Therapy

## 2018-07-10 NOTE — Addendum Note (Signed)
Addended by: Collie Siad D on: 07/10/2018 08:38 AM   Modules accepted: Orders

## 2018-07-16 ENCOUNTER — Encounter: Payer: Medicare Other | Admitting: Physical Therapy

## 2018-07-18 ENCOUNTER — Encounter: Payer: Medicare Other | Admitting: Physical Therapy

## 2018-07-23 ENCOUNTER — Ambulatory Visit: Payer: Medicare Other | Attending: Family Medicine

## 2018-07-23 DIAGNOSIS — R269 Unspecified abnormalities of gait and mobility: Secondary | ICD-10-CM | POA: Insufficient documentation

## 2018-07-23 DIAGNOSIS — M25551 Pain in right hip: Secondary | ICD-10-CM | POA: Diagnosis present

## 2018-07-23 DIAGNOSIS — R2689 Other abnormalities of gait and mobility: Secondary | ICD-10-CM | POA: Diagnosis present

## 2018-07-23 DIAGNOSIS — R2681 Unsteadiness on feet: Secondary | ICD-10-CM | POA: Diagnosis present

## 2018-07-23 DIAGNOSIS — M25552 Pain in left hip: Secondary | ICD-10-CM | POA: Insufficient documentation

## 2018-07-23 DIAGNOSIS — M6281 Muscle weakness (generalized): Secondary | ICD-10-CM | POA: Diagnosis present

## 2018-07-23 NOTE — Patient Instructions (Signed)
Access Code: [email or web address]  URL: [email or web address]  Date: 07/23/2018  Prepared by: Janna Arch   Exercises  Standing Hip Abduction with Counter Support - 10 reps - 2 sets - 2 hold - 1x daily - 7x weekly  Standing March with Counter Support - 10 reps - 2 sets - 2 hold - 1x daily - 7x weekly  Tandem Stance with Support - 10 reps - 2 sets - 30 hold - 1x daily - 7x weekly

## 2018-07-23 NOTE — Therapy (Addendum)
[place] MAIN [facility] SERVICES [address] [place], [place], [address] Phone: [phone]   Fax:  [phone]  Physical Therapy Treatment  Patient Details  Name: Eric Torres MRN: [id number] Date of Birth: [birth year] Referring Provider (PT): Mcneil Sober   Encounter Date: 07/23/2018  PT End of Session - 07/23/18 1605    Visit Number  2    Number of Visits  13    Date for PT Re-Evaluation  08/20/18    Authorization Type  2/10 (PN begins 07/09/18)    PT Start Time  1508    PT Stop Time  1553    PT Time Calculation (min)  45 min    Equipment Utilized During Treatment  Gait belt    Activity Tolerance  Patient tolerated treatment well    Behavior During Therapy  WFL for tasks assessed/performed       Past Medical History:  Diagnosis Date  . BPH (benign prostatic hyperplasia)   . 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 COPD (chronic obstructive pulmonary disease) (HCC)    MILD-NO INHALERS PER PT  . Essential tremor   . Heart murmur   . History of brachytherapy   . History of TIA (transient ischemic attack) 2015   LEFT THUMB AND INDEX FINGER-SOMETIMES I DROP THINGS  . Hypercholesteremia   . Hypertension   . Hypothyroidism     Past Surgical History:  Procedure Laterality Date  . CHOLECYSTECTOMY    . CYSTOSCOPY WITH BIOPSY N/A 12/29/2015   Procedure: CYSTOSCOPY WITH BIOPSY;  Surgeon: Royston Cowper, MD;  Location: [facility] ORS;  Service: Urology;  Laterality: N/A;  . EXPLORATORY LAPAROTOMY  1991   VOLVULUS AND BOWEL OBSTRUCTION  . ILIAC ARTERY STENT Bilateral   . TONSILLECTOMY    . TRANSURETHRAL RESECTION OF BLADDER TUMOR N/A 12/29/2015   Procedure: TRANSURETHRAL RESECTION OF BLADDER TUMOR (TURBT)/MITOMYCIN INSTILLATION;  Surgeon: Royston Cowper, MD;  Location: [facility] ORS;  Service: Urology;  Laterality: N/A;  . TRANSURETHRAL RESECTION OF BLADDER TUMOR WITH GYRUS (TURBT-GYRUS)  2013  . VASECTOMY      There were no vitals filed for  this visit.  Subjective Assessment - 07/23/18 1522    Subjective  Patient returns following trip to [place]. States he fell twice during trip. Reports once he was getting on escalator in airport while carrying luggage, and second time was following a long day of walking and "legs just gave out." Denies being hurt.     Pertinent History  Patient reports his main issue is his balance. States he has a new doctor that refered him to this clinic instead of outpatient in [facility] where he has received physical therapy prior. Has received therapy for balance in the past (approximately 1 year ago). States he has fallen twice recently when he tripped over leg lift of recliner (6 weeks ago), and fell carrying groceries while trying to open door (4 weeks ago). Reports he does have a fear of falling while walking. States he has circulation issues in his legs that has returned and has not told doctor. Completes water aerobics 2-3x a week. Patient states he also has shoulder pain and back pain, and that shoulder hurts is when he raises arm in abduction against resistance and when rolling over in bed. Used to enjoy playing golf. Would like to be able to walk around the block with his wife.     Limitations  Standing;Walking;House hold activities    How long  can you stand comfortably?  5 minutes    How long can you walk comfortably?  5 minutes     Patient Stated Goals  walk around block with wife, prevent falls,     Currently in Pain?  No/denies      HEP demonstrated, completed, and handout provided  -Standing hip abduction x10 each BUE -Standing marches x10 each leg BUE. Verbal cues to increase step height and decrease speed of movement.  -Modified tandem 2x 30 seconds each leg. No UE support  63mwt- 862ft. No AD  1 stumble/LOB during test. Subjective reporting of legs getting tired at end of test. Decreased bilateral foot clearance, decreased hip extension, increased sway.   Sit to stand x10 with hands on  knees. CGA. Verbal cues to stand all the way up when fatigued.   In //bars: Standing on airex pad with left/right/up/down head turns. x10 each direction CGA. Verbal cues to decrease head movement to prevent dizziness.   Standing on airex pad tapping 4in step x10 each leg. BUE CGA. Verbal cues to widen BOS for stability   Side stepping x3 lengths of bars. CGA BUE. Verbal cues to pick up feet and maintain neutral LE alignment.   Patient requires cueing for exercise technique and task orientation.    Access Code: [email or web address]  URL: [email or web address]  Date: 07/23/2018  Prepared by: Janna Arch   Exercises  Standing Hip Abduction with Counter Support - 10 reps - 2 sets - 2 hold - 1x daily - 7x weekly  Standing March with Counter Support - 10 reps - 2 sets - 2 hold - 1x daily - 7x weekly  Tandem Stance with Support - 10 reps - 2 sets - 30 hold - 1x daily - 7x weekly                         PT Education - 07/23/18 1604    Education provided  Yes    Education Details  balance safety, HEP, exercise technique    Person(s) Educated  Patient    Methods  Explanation;Demonstration;Verbal cues;Handout    Comprehension  Verbalized understanding;Returned demonstration;Need further instruction       PT Short Term Goals - 07/10/18 0807      PT SHORT TERM GOAL #1   Title  Patient will be independent with completion of HEP to improve ability to complete functional tasks.    Time  2    Period  Weeks    Status  New    Target Date  07/24/18      PT SHORT TERM GOAL #2   Title  Patient will report no falls in the next two weeks to demonstrate improved safety and fall risk.     Baseline  07/09/18: fell 2x in 6 weeks    Time  2    Period  Weeks    Status  New    Target Date  07/24/18        PT Long Term Goals - 07/10/18 0810      PT LONG TERM GOAL #1   Title  Patient will improve 5x sit to stand to <10seconds without UE support to demonstrate improvements  in functional strength.     Baseline  07/09/18: 13sec hands on knees    Time  6    Period  Weeks    Status  New    Target Date  08/21/18      PT LONG TERM GOAL #2  Title  Patient will increase BERG balance score to >52/56 to decrease fall risk and improve balance.     Baseline  07/09/18: 47/56    Time  6    Period  Weeks    Status  New    Target Date  08/21/18      PT LONG TERM GOAL #3   Title  Patient will be able to walk with wife around neighborhood (>73minutes) to demonstrate improved balance and community ambulation.     Baseline  07/09/18: 58minutes    Time  6    Period  Weeks    Status  New    Target Date  08/21/18      PT LONG TERM GOAL #4   Title  Patient will improve 85mwt to >1.17m/s limiting fall risk and improve community ambulation.     Baseline  07/09/18: 0.815m/s    Time  6    Period  Weeks    Status  New    Target Date  08/21/18            Plan - 07/23/18 1612    Clinical Impression Statement  Patient returns to first follow up appointment after initial evaluation and vacation. Patient ambulated 847ft without an assistive device but with LOB and increased sway with duration likely due to fatigue.  Patient challenged with balance exercises due to decreased muscle endurance, fatigue, and poor hip and ankle stability. Patient will continue to benefit from skilled physical therapy to increase strength, balance, and functional mobility to decrease fall risk and improve quality of life.     Rehab Potential  Good    Clinical Impairments Affecting Rehab Potential  (+) support system (-) age;     PT Frequency  2x / week    PT Duration  6 weeks    PT Treatment/Interventions  ADLs/Self Care Home Management;Energy conservation;Patient/family education;Therapeutic exercise;Balance training;Neuromuscular re-education;Therapeutic activities;Functional mobility training;Stair training;DME Instruction;Gait training;Aquatic Therapy;Cryotherapy;Electrical  Stimulation;Iontophoresis 4mg /ml Dexamethasone;Moist Heat;Traction;Ultrasound;Taping;Passive range of motion;Vestibular;Manual techniques    PT Next Visit Plan  review HEP, strength, balance     PT Home Exercise Plan  standing hip abduction, standing marches, modified tandem stance    Consulted and Agree with Plan of Care  Patient       Patient will benefit from skilled therapeutic intervention in order to improve the following deficits and impairments:  Abnormal gait, Decreased activity tolerance, Decreased balance, Decreased coordination, Decreased endurance, Decreased mobility, Decreased strength, Difficulty walking, Impaired sensation, Pain, Impaired UE functional use, Postural dysfunction, Improper body mechanics, Impaired perceived functional ability, Decreased knowledge of use of DME  Visit Diagnosis: Muscle weakness (generalized)  Unsteady gait  Loss of balance     Problem List There are no active problems to display for this patient.  Janna Arch, PT, DPT   07/23/2018, 6:29 PM  [place] MAIN [facility] SERVICES [address] [place], [place], [address] Phone: [phone]   Fax:  [phone]  Name: Eric Torres MRN: [id number] Date of Birth: January 18, 1932

## 2018-07-30 ENCOUNTER — Encounter: Payer: Self-pay | Admitting: Physical Therapy

## 2018-07-30 ENCOUNTER — Ambulatory Visit: Payer: Medicare Other | Admitting: Physical Therapy

## 2018-07-30 DIAGNOSIS — M6281 Muscle weakness (generalized): Secondary | ICD-10-CM

## 2018-07-30 DIAGNOSIS — R269 Unspecified abnormalities of gait and mobility: Secondary | ICD-10-CM

## 2018-07-30 DIAGNOSIS — M25552 Pain in left hip: Secondary | ICD-10-CM

## 2018-07-30 DIAGNOSIS — R2681 Unsteadiness on feet: Secondary | ICD-10-CM

## 2018-07-30 DIAGNOSIS — R2689 Other abnormalities of gait and mobility: Secondary | ICD-10-CM

## 2018-07-30 DIAGNOSIS — M25551 Pain in right hip: Secondary | ICD-10-CM

## 2018-07-30 NOTE — Therapy (Signed)
[place] MAIN [facility] SERVICES [address] [place], [place], [address] Phone: [phone]   Fax:  [phone]  Physical Therapy Treatment  Patient Details  Name: Eric Torres MRN: [id number] Date of Birth: Jul 16, 1932 Referring Provider (PT): Mcneil Sober   Encounter Date: 07/30/2018  PT End of Session - 07/30/18 1537    Visit Number  3    Number of Visits  13    Date for PT Re-Evaluation  08/20/18    Authorization Type  3/10 (PN begins 07/09/18)    Equipment Utilized During Treatment  Gait belt    Activity Tolerance  Patient tolerated treatment well    Behavior During Therapy  [facility] for tasks assessed/performed       Past Medical History:  Diagnosis Date  . BPH (benign prostatic hyperplasia)   . 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 COPD (chronic obstructive pulmonary disease) (HCC)    MILD-NO INHALERS PER PT  . Essential tremor   . Heart murmur   . History of brachytherapy   . History of TIA (transient ischemic attack) 2015   LEFT THUMB AND INDEX FINGER-SOMETIMES I DROP THINGS  . Hypercholesteremia   . Hypertension   . Hypothyroidism     Past Surgical History:  Procedure Laterality Date  . CHOLECYSTECTOMY    . CYSTOSCOPY WITH BIOPSY N/A 12/29/2015   Procedure: CYSTOSCOPY WITH BIOPSY;  Surgeon: Royston Cowper, MD;  Location: [facility] ORS;  Service: Urology;  Laterality: N/A;  . EXPLORATORY LAPAROTOMY  1991   VOLVULUS AND BOWEL OBSTRUCTION  . ILIAC ARTERY STENT Bilateral   . TONSILLECTOMY    . TRANSURETHRAL RESECTION OF BLADDER TUMOR N/A 12/29/2015   Procedure: TRANSURETHRAL RESECTION OF BLADDER TUMOR (TURBT)/MITOMYCIN INSTILLATION;  Surgeon: Royston Cowper, MD;  Location: [facility] ORS;  Service: Urology;  Laterality: N/A;  . TRANSURETHRAL RESECTION OF BLADDER TUMOR WITH GYRUS (TURBT-GYRUS)  2013  . VASECTOMY      There were no vitals filed for this visit.  Subjective Assessment - 07/30/18 1535    Subjective  Patient has  questions about the third exercise for his HEP. reports no pain and no changes.    Pertinent History  Patient reports his main issue is his balance. States he has a new doctor that refered him to this clinic instead of outpatient in [facility] where he has received physical therapy prior. Has received therapy for balance in the past (approximately 1 year ago). States he has fallen twice recently when he tripped over leg lift of recliner (6 weeks ago), and fell carrying groceries while trying to open door (4 weeks ago). Reports he does have a fear of falling while walking. States he has circulation issues in his legs that has returned and has not told doctor. Completes water aerobics 2-3x a week. Patient states he also has shoulder pain and back pain, and that shoulder hurts is when he raises arm in abduction against resistance and when rolling over in bed. Used to enjoy playing golf. Would like to be able to walk around the block with his wife.     Limitations  Standing;Walking;House hold activities    How long can you stand comfortably?  5 minutes    How long can you walk comfortably?  5 minutes     Patient Stated Goals  walk around block with wife, prevent falls,     Currently in Pain?  No/denies    Pain Score  0-No pain  Pain Onset  More than a month ago       Treatment;  Leg press 120 lbs x 20 x 3, heel raises 60 lbs x 20 x 2 .   In //bars:  Reviewed HEP : Tandem Stance with Support - 10 reps - 2 sets - 30 hold - 1x daily - 7x weekly   Standing on 1/2 foam  Flat side down and feet side by side with head straight ahead and challenging static balance  Standing on 1/2 foam  Flat side up and feet side by side with head straight ahead and challenging static balance  Stepping over hurdle fwd/ bwd , side to side x 15 with 50% feet catching and no UE support  Standing modified tandem  with left/right/up/down head turns. x10 each direction CGA. Verbal cues to decrease head movement to prevent  dizziness.   Standing on airex pad tapping 4in step x10 each leg. BUE CGA. Verbal cues to widen BOS for stability   Side stepping to target x 10  CGA BUE. Verbal cues to pick up feet and maintain neutral LE alignment.   rwd tepping to target x 10  CGA BUE. Verbal cues to pick up feet and maintain neutral LE alignment.   Patient requires cueing for exercise technique and task orientation                        PT Education - 07/30/18 1536    Education provided  Yes    Education Details  HEP, safety    Person(s) Educated  Patient    Methods  Explanation    Comprehension  Verbalized understanding;Returned demonstration       PT Short Term Goals - 07/10/18 0807      PT SHORT TERM GOAL #1   Title  Patient will be independent with completion of HEP to improve ability to complete functional tasks.    Time  2    Period  Weeks    Status  New    Target Date  07/24/18      PT SHORT TERM GOAL #2   Title  Patient will report no falls in the next two weeks to demonstrate improved safety and fall risk.     Baseline  07/09/18: fell 2x in 6 weeks    Time  2    Period  Weeks    Status  New    Target Date  07/24/18        PT Long Term Goals - 07/10/18 0810      PT LONG TERM GOAL #1   Title  Patient will improve 5x sit to stand to <10seconds without UE support to demonstrate improvements in functional strength.     Baseline  07/09/18: 13sec hands on knees    Time  6    Period  Weeks    Status  New    Target Date  08/21/18      PT LONG TERM GOAL #2   Title  Patient will increase BERG balance score to >52/56 to decrease fall risk and improve balance.     Baseline  07/09/18: 47/56    Time  6    Period  Weeks    Status  New    Target Date  08/21/18      PT LONG TERM GOAL #3   Title  Patient will be able to walk with wife around neighborhood (>25minutes) to demonstrate improved balance and community ambulation.  Baseline  07/09/18: 70minutes    Time  6     Period  Weeks    Status  New    Target Date  08/21/18      PT LONG TERM GOAL #4   Title  Patient will improve 54mwt to >1.9m/s limiting fall risk and improve community ambulation.     Baseline  07/09/18: 0.878m/s    Time  6    Period  Weeks    Status  New    Target Date  08/21/18            Plan - 07/30/18 1537    Clinical Impression Statement  Patient demonstrates understanding of HEP with min corrections needed. Patient challenged closed chain and open chain exercises with multiple repetitions due to fatigue. Weak LE combined with weak core musculature results in poor postural control and balance deficits.. Patient will continue to benefit from skilled physical therapy to improve balance and mobility.    Rehab Potential  Good    Clinical Impairments Affecting Rehab Potential  (+) support system (-) age;     PT Frequency  2x / week    PT Duration  6 weeks    PT Treatment/Interventions  ADLs/Self Care Home Management;Energy conservation;Patient/family education;Therapeutic exercise;Balance training;Neuromuscular re-education;Therapeutic activities;Functional mobility training;Stair training;DME Instruction;Gait training;Aquatic Therapy;Cryotherapy;Electrical Stimulation;Iontophoresis 4mg /ml Dexamethasone;Moist Heat;Traction;Ultrasound;Taping;Passive range of motion;Vestibular;Manual techniques    PT Next Visit Plan  review HEP, strength, balance     PT Home Exercise Plan  standing hip abduction, standing marches, modified tandem stance    Consulted and Agree with Plan of Care  Patient       Patient will benefit from skilled therapeutic intervention in order to improve the following deficits and impairments:  Abnormal gait, Decreased activity tolerance, Decreased balance, Decreased coordination, Decreased endurance, Decreased mobility, Decreased strength, Difficulty walking, Impaired sensation, Pain, Impaired UE functional use, Postural dysfunction, Improper body mechanics, Impaired  perceived functional ability, Decreased knowledge of use of DME  Visit Diagnosis: Muscle weakness (generalized)  Unsteady gait  Loss of balance  Pain in right hip  Pain in left hip  Gait difficulty  Impairment of balance     Problem List There are no active problems to display for this patient.   [address] [place] DPT 07/30/2018, 4:04 PM  [place] MAIN [facility] SERVICES [address] [place], [place], [address] Phone: [phone]   Fax:  [phone]  Name: JAQUAVIUS HUDLER MRN: [id number] Date of Birth: 24-Nov-1931

## 2018-08-01 ENCOUNTER — Encounter: Payer: Medicare Other | Admitting: Physical Therapy

## 2018-08-07 ENCOUNTER — Encounter: Payer: Self-pay | Admitting: Physical Therapy

## 2018-08-07 ENCOUNTER — Ambulatory Visit: Payer: Medicare Other | Admitting: Physical Therapy

## 2018-08-07 DIAGNOSIS — M6281 Muscle weakness (generalized): Secondary | ICD-10-CM | POA: Diagnosis not present

## 2018-08-07 DIAGNOSIS — R2689 Other abnormalities of gait and mobility: Secondary | ICD-10-CM

## 2018-08-07 DIAGNOSIS — R2681 Unsteadiness on feet: Secondary | ICD-10-CM

## 2018-08-07 NOTE — Therapy (Signed)
[place] MAIN [facility] SERVICES [address] [place], [place], [address] Phone: [phone]   Fax:  [phone]  Physical Therapy Treatment  Patient Details  Name: Eric Torres MRN: [id number] Date of Birth: 23-Oct-1931 Referring Provider (PT): Mcneil Sober   Encounter Date: 08/07/2018  PT End of Session - 08/07/18 1536    Visit Number  4    Number of Visits  13    Date for PT Re-Evaluation  08/20/18    Authorization Type  4/10 (PN begins 07/09/18)    PT Start Time  0330    PT Stop Time  0410    PT Time Calculation (min)  40 min    Equipment Utilized During Treatment  Gait belt    Activity Tolerance  Patient tolerated treatment well    Behavior During Therapy  WFL for tasks assessed/performed       Past Medical History:  Diagnosis Date  . BPH (benign prostatic hyperplasia)   . 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 COPD (chronic obstructive pulmonary disease) (HCC)    MILD-NO INHALERS PER PT  . Essential tremor   . Heart murmur   . History of brachytherapy   . History of TIA (transient ischemic attack) 2015   LEFT THUMB AND INDEX FINGER-SOMETIMES I DROP THINGS  . Hypercholesteremia   . Hypertension   . Hypothyroidism     Past Surgical History:  Procedure Laterality Date  . CHOLECYSTECTOMY    . CYSTOSCOPY WITH BIOPSY N/A 12/29/2015   Procedure: CYSTOSCOPY WITH BIOPSY;  Surgeon: Royston Cowper, MD;  Location: [facility] ORS;  Service: Urology;  Laterality: N/A;  . EXPLORATORY LAPAROTOMY  1991   VOLVULUS AND BOWEL OBSTRUCTION  . ILIAC ARTERY STENT Bilateral   . TONSILLECTOMY    . TRANSURETHRAL RESECTION OF BLADDER TUMOR N/A 12/29/2015   Procedure: TRANSURETHRAL RESECTION OF BLADDER TUMOR (TURBT)/MITOMYCIN INSTILLATION;  Surgeon: Royston Cowper, MD;  Location: [facility] ORS;  Service: Urology;  Laterality: N/A;  . TRANSURETHRAL RESECTION OF BLADDER TUMOR WITH GYRUS (TURBT-GYRUS)  2013  . VASECTOMY      There were no vitals filed for  this visit.  Subjective Assessment - 08/07/18 1535    Subjective  Patient  reports no pain and no changes.    Pertinent History  Patient reports his main issue is his balance. States he has a new doctor that refered him to this clinic instead of outpatient in [facility] where he has received physical therapy prior. Has received therapy for balance in the past (approximately 1 year ago). States he has fallen twice recently when he tripped over leg lift of recliner (6 weeks ago), and fell carrying groceries while trying to open door (4 weeks ago). Reports he does have a fear of falling while walking. States he has circulation issues in his legs that has returned and has not told doctor. Completes water aerobics 2-3x a week. Patient states he also has shoulder pain and back pain, and that shoulder hurts is when he raises arm in abduction against resistance and when rolling over in bed. Used to enjoy playing golf. Would like to be able to walk around the block with his wife.     Limitations  Standing;Walking;House hold activities    How long can you stand comfortably?  5 minutes    How long can you walk comfortably?  5 minutes     Patient Stated Goals  walk around block with wife, prevent falls,  Currently in Pain?  No/denies    Pain Score  0-No pain    Pain Onset  More than a month ago        Treatment;  Leg press 120 lbs x 20 x 3, heel raises 60 lbs x 20 x 2 .   In //bars:  Blue foam and 2 lbs trunk rotation x 20   Tandem stand and head turns x 20   Matrix fwd/ bwd with 22 . 5 lbs x 3 reps  Stepping over hurdle fwd/ bwd , side to side x 15 with 50% feet catching and no UE support  Standing modified tandem  with left/right/up/down head turns. x10 each direction CGA. Verbal cues to decrease head movement to prevent dizziness.   Standing on airex pad tapping 4in step x10 each leg. BUE CGA. Verbal cues to widen BOS for stability   Side stepping to target x 10  CGA BUE. Verbal cues  to pick up feet and maintain neutral LE alignment.   fwd tepping to target x 10  CGA BUE. Verbal cues to pick up feet and maintain neutral LE alignment.   Patient requires cueing for exercise technique and task orientation                        PT Education - 08/07/18 1535    Education provided  Yes    Education Details  HEP, safety    Person(s) Educated  Patient    Methods  Explanation    Comprehension  Verbalized understanding;Returned demonstration;Need further instruction       PT Short Term Goals - 07/10/18 0807      PT SHORT TERM GOAL #1   Title  Patient will be independent with completion of HEP to improve ability to complete functional tasks.    Time  2    Period  Weeks    Status  New    Target Date  07/24/18      PT SHORT TERM GOAL #2   Title  Patient will report no falls in the next two weeks to demonstrate improved safety and fall risk.     Baseline  07/09/18: fell 2x in 6 weeks    Time  2    Period  Weeks    Status  New    Target Date  07/24/18        PT Long Term Goals - 07/10/18 0810      PT LONG TERM GOAL #1   Title  Patient will improve 5x sit to stand to <10seconds without UE support to demonstrate improvements in functional strength.     Baseline  07/09/18: 13sec hands on knees    Time  6    Period  Weeks    Status  New    Target Date  08/21/18      PT LONG TERM GOAL #2   Title  Patient will increase BERG balance score to >52/56 to decrease fall risk and improve balance.     Baseline  07/09/18: 47/56    Time  6    Period  Weeks    Status  New    Target Date  08/21/18      PT LONG TERM GOAL #3   Title  Patient will be able to walk with wife around neighborhood (>1minutes) to demonstrate improved balance and community ambulation.     Baseline  07/09/18: 82minutes    Time  6    Period  Weeks    Status  New    Target Date  08/21/18      PT LONG TERM GOAL #4   Title  Patient will improve 29mwt to >1.55m/s limiting fall  risk and improve community ambulation.     Baseline  07/09/18: 0.856m/s    Time  6    Period  Weeks    Status  New    Target Date  08/21/18            Plan - 08/07/18 1537    Clinical Impression Statement  Pt presents with unsteadiness on uneven surfaces and fatigues with therapeutic exercises. Patient needs assist with beginning moderate balance activities and needs CGA assist with standing activities. Patient demonstrates difficulty with dynamic standing balance and with narrow base of support and increased challenges for LE.  Patient tolerated all interventions well this date and skilled PT will continue to improve mobility and strength.     Rehab Potential  Good    Clinical Impairments Affecting Rehab Potential  (+) support system (-) age;     PT Frequency  2x / week    PT Duration  6 weeks    PT Treatment/Interventions  ADLs/Self Care Home Management;Energy conservation;Patient/family education;Therapeutic exercise;Balance training;Neuromuscular re-education;Therapeutic activities;Functional mobility training;Stair training;DME Instruction;Gait training;Aquatic Therapy;Cryotherapy;Electrical Stimulation;Iontophoresis 4mg /ml Dexamethasone;Moist Heat;Traction;Ultrasound;Taping;Passive range of motion;Vestibular;Manual techniques    PT Next Visit Plan  review HEP, strength, balance     PT Home Exercise Plan  standing hip abduction, standing marches, modified tandem stance    Consulted and Agree with Plan of Care  Patient       Patient will benefit from skilled therapeutic intervention in order to improve the following deficits and impairments:  Abnormal gait, Decreased activity tolerance, Decreased balance, Decreased coordination, Decreased endurance, Decreased mobility, Decreased strength, Difficulty walking, Impaired sensation, Pain, Impaired UE functional use, Postural dysfunction, Improper body mechanics, Impaired perceived functional ability, Decreased knowledge of use of  DME  Visit Diagnosis: Muscle weakness (generalized)  Unsteady gait  Loss of balance     Problem List There are no active problems to display for this patient.   [address] [place] DPT 08/07/2018, 3:38 PM  [place] MAIN [facility] SERVICES [address] [place], [place], [address] Phone: [phone]   Fax:  [phone]  Name: Eric Torres MRN: [id number] Date of Birth: [birth year]

## 2018-08-09 ENCOUNTER — Ambulatory Visit: Payer: Medicare Other

## 2018-08-09 ENCOUNTER — Encounter: Payer: Self-pay | Admitting: Physical Therapy

## 2018-08-09 DIAGNOSIS — M6281 Muscle weakness (generalized): Secondary | ICD-10-CM

## 2018-08-09 DIAGNOSIS — R2689 Other abnormalities of gait and mobility: Secondary | ICD-10-CM

## 2018-08-09 DIAGNOSIS — M25551 Pain in right hip: Secondary | ICD-10-CM

## 2018-08-09 DIAGNOSIS — R2681 Unsteadiness on feet: Secondary | ICD-10-CM

## 2018-08-09 DIAGNOSIS — R269 Unspecified abnormalities of gait and mobility: Secondary | ICD-10-CM

## 2018-08-09 DIAGNOSIS — M25552 Pain in left hip: Secondary | ICD-10-CM

## 2018-08-09 NOTE — Therapy (Signed)
[place] MAIN [facility] SERVICES [address] [place], [place], [address] Phone: [phone]   Fax:  [phone]  Physical Therapy Treatment  Patient Details  Name: Eric Torres MRN: [id number] Date of Birth: 08/12/1932 Referring Provider (PT): Mcneil Sober   Encounter Date: 08/09/2018  PT End of Session - 08/09/18 1512    Visit Number  5    Number of Visits  13    Date for PT Re-Evaluation  08/20/18    Authorization Type  5/10 (PN begins 07/09/18)    PT Start Time  1515    PT Stop Time  1601    PT Time Calculation (min)  46 min    Equipment Utilized During Treatment  Gait belt    Activity Tolerance  Patient tolerated treatment well    Behavior During Therapy  WFL for tasks assessed/performed       Past Medical History:  Diagnosis Date  . BPH (benign prostatic hyperplasia)   . 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 COPD (chronic obstructive pulmonary disease) (HCC)    MILD-NO INHALERS PER PT  . Essential tremor   . Heart murmur   . History of brachytherapy   . History of TIA (transient ischemic attack) 2015   LEFT THUMB AND INDEX FINGER-SOMETIMES I DROP THINGS  . Hypercholesteremia   . Hypertension   . Hypothyroidism     Past Surgical History:  Procedure Laterality Date  . CHOLECYSTECTOMY    . CYSTOSCOPY WITH BIOPSY N/A 12/29/2015   Procedure: CYSTOSCOPY WITH BIOPSY;  Surgeon: Royston Cowper, MD;  Location: [facility] ORS;  Service: Urology;  Laterality: N/A;  . EXPLORATORY LAPAROTOMY  1991   VOLVULUS AND BOWEL OBSTRUCTION  . ILIAC ARTERY STENT Bilateral   . TONSILLECTOMY    . TRANSURETHRAL RESECTION OF BLADDER TUMOR N/A 12/29/2015   Procedure: TRANSURETHRAL RESECTION OF BLADDER TUMOR (TURBT)/MITOMYCIN INSTILLATION;  Surgeon: Royston Cowper, MD;  Location: [facility] ORS;  Service: Urology;  Laterality: N/A;  . TRANSURETHRAL RESECTION OF BLADDER TUMOR WITH GYRUS (TURBT-GYRUS)  2013  . VASECTOMY      There were no vitals filed for  this visit.  Subjective Assessment - 08/09/18 1519    Subjective  Patient reports no pain at start of session, no reported stumbles or falls.     Pertinent History  Patient reports his main issue is his balance. States he has a new doctor that refered him to this clinic instead of outpatient in [facility] where he has received physical therapy prior. Has received therapy for balance in the past (approximately 1 year ago). States he has fallen twice recently when he tripped over leg lift of recliner (6 weeks ago), and fell carrying groceries while trying to open door (4 weeks ago). Reports he does have a fear of falling while walking. States he has circulation issues in his legs that has returned and has not told doctor. Completes water aerobics 2-3x a week. Patient states he also has shoulder pain and back pain, and that shoulder hurts is when he raises arm in abduction against resistance and when rolling over in bed. Used to enjoy playing golf. Would like to be able to walk around the block with his wife.     Currently in Pain?  No/denies       Treatment; nustep level 3 x 74mins  Leg press 120 lbs x 20 x 3, heel raises 60 lbs x 20 x 2   In //bars:  ?  Tandem stand and head turns x 20, intermittent UE support needed, occasional minAx1  From PT to correct LOB  Stepping over hurdle fwd/ bwd , side to side x 20 with 50% feet catching and no intermittent UE support, cues to avoid UE support   Side stepping 92M x 3 rounds cues for posture, proper foot placement (not in parallel bars)  Ambulating in hallway with plying cards on walls, identification during ambulation to encourage head turns, correct posture and multitasking x2 rounds, CGA  Standing on airex pad tapping 4in step x10 each leg. BUE CGA. Verbal cues to widen BOS for stability   Standing on airex pad and stepping up onto 4 in step x15 each leg, CGA, intermittent UE support.  Patient requires cueing for exercise technique and task  orientation. CGA/close supervision throughout exercises.     PT Education - 08/09/18 1511    Education provided  Yes    Education Details  HEP, safety    Person(s) Educated  Patient    Methods  Explanation    Comprehension  Verbalized understanding;Returned demonstration;Need further instruction       PT Short Term Goals - 07/10/18 0807      PT SHORT TERM GOAL #1   Title  Patient will be independent with completion of HEP to improve ability to complete functional tasks.    Time  2    Period  Weeks    Status  New    Target Date  07/24/18      PT SHORT TERM GOAL #2   Title  Patient will report no falls in the next two weeks to demonstrate improved safety and fall risk.     Baseline  07/09/18: fell 2x in 6 weeks    Time  2    Period  Weeks    Status  New    Target Date  07/24/18        PT Long Term Goals - 07/10/18 0810      PT LONG TERM GOAL #1   Title  Patient will improve 5x sit to stand to <10seconds without UE support to demonstrate improvements in functional strength.     Baseline  07/09/18: 13sec hands on knees    Time  6    Period  Weeks    Status  New    Target Date  08/21/18      PT LONG TERM GOAL #2   Title  Patient will increase BERG balance score to >52/56 to decrease fall risk and improve balance.     Baseline  07/09/18: 47/56    Time  6    Period  Weeks    Status  New    Target Date  08/21/18      PT LONG TERM GOAL #3   Title  Patient will be able to walk with wife around neighborhood (>33minutes) to demonstrate improved balance and community ambulation.     Baseline  07/09/18: 82minutes    Time  6    Period  Weeks    Status  New    Target Date  08/21/18      PT LONG TERM GOAL #4   Title  Patient will improve 68mwt to >1.68m/s limiting fall risk and improve community ambulation.     Baseline  07/09/18: 0.822m/s    Time  6    Period  Weeks    Status  New    Target Date  08/21/18  Plan - 08/09/18 1744    Clinical Impression  Statement  Patient has most difficulty with dynamic balance activities, Needs CGA/minAx1 from PT due to multiple LOB. Pt limited by bilateral foot clearance, verbal and visual cues to attend to feet with fair carry over.    PT Frequency  2x / week    PT Duration  6 weeks    PT Treatment/Interventions  ADLs/Self Care Home Management;Energy conservation;Patient/family education;Therapeutic exercise;Balance training;Neuromuscular re-education;Therapeutic activities;Functional mobility training;Stair training;DME Instruction;Gait training;Aquatic Therapy;Cryotherapy;Electrical Stimulation;Iontophoresis 4mg /ml Dexamethasone;Moist Heat;Traction;Ultrasound;Taping;Passive range of motion;Vestibular;Manual techniques       Patient will benefit from skilled therapeutic intervention in order to improve the following deficits and impairments:  Abnormal gait, Decreased activity tolerance, Decreased balance, Decreased coordination, Decreased endurance, Decreased mobility, Decreased strength, Difficulty walking, Impaired sensation, Pain, Impaired UE functional use, Postural dysfunction, Improper body mechanics, Impaired perceived functional ability, Decreased knowledge of use of DME  Visit Diagnosis: Muscle weakness (generalized)  Unsteady gait  Loss of balance  Pain in right hip  Pain in left hip  Gait difficulty  Impairment of balance     Problem List There are no active problems to display for this patient.   Lieutenant Diego PT, DPT 5:53 PM,08/09/18 [place] MAIN [facility] SERVICES [address] [place], [place], [address] Phone: [phone]   Fax:  [phone]  Name: Eric Torres MRN: [id number] Date of Birth: [birth year]

## 2018-08-13 ENCOUNTER — Encounter: Payer: Self-pay | Admitting: Physical Therapy

## 2018-08-13 ENCOUNTER — Ambulatory Visit: Payer: Medicare Other | Attending: Family Medicine

## 2018-08-13 DIAGNOSIS — M6281 Muscle weakness (generalized): Secondary | ICD-10-CM | POA: Diagnosis not present

## 2018-08-13 DIAGNOSIS — R269 Unspecified abnormalities of gait and mobility: Secondary | ICD-10-CM

## 2018-08-13 DIAGNOSIS — M25552 Pain in left hip: Secondary | ICD-10-CM | POA: Diagnosis present

## 2018-08-13 DIAGNOSIS — M25551 Pain in right hip: Secondary | ICD-10-CM | POA: Diagnosis present

## 2018-08-13 DIAGNOSIS — R2681 Unsteadiness on feet: Secondary | ICD-10-CM

## 2018-08-13 DIAGNOSIS — R2689 Other abnormalities of gait and mobility: Secondary | ICD-10-CM

## 2018-08-13 NOTE — Therapy (Signed)
[place] MAIN [facility] SERVICES [address] [place], [place], [address] Phone: [phone]   Fax:  [phone]  Physical Therapy Treatment  Patient Details  Name: Eric Torres MRN: [id number] Date of Birth: 1932-08-19 Referring Provider (PT): Mcneil Sober   Encounter Date: 08/13/2018  PT End of Session - 08/13/18 1520    Visit Number  6    Number of Visits  13    Date for PT Re-Evaluation  08/20/18    Authorization Type  6/10 (PN begins 07/09/18)    PT Start Time  1521    PT Stop Time  1601    PT Time Calculation (min)  40 min    Equipment Utilized During Treatment  Gait belt    Activity Tolerance  Patient tolerated treatment well    Behavior During Therapy  WFL for tasks assessed/performed       Past Medical History:  Diagnosis Date  . BPH (benign prostatic hyperplasia)   . 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 COPD (chronic obstructive pulmonary disease) (HCC)    MILD-NO INHALERS PER PT  . Essential tremor   . Heart murmur   . History of brachytherapy   . History of TIA (transient ischemic attack) 2015   LEFT THUMB AND INDEX FINGER-SOMETIMES I DROP THINGS  . Hypercholesteremia   . Hypertension   . Hypothyroidism     Past Surgical History:  Procedure Laterality Date  . CHOLECYSTECTOMY    . CYSTOSCOPY WITH BIOPSY N/A 12/29/2015   Procedure: CYSTOSCOPY WITH BIOPSY;  Surgeon: Royston Cowper, MD;  Location: [facility] ORS;  Service: Urology;  Laterality: N/A;  . EXPLORATORY LAPAROTOMY  1991   VOLVULUS AND BOWEL OBSTRUCTION  . ILIAC ARTERY STENT Bilateral   . TONSILLECTOMY    . TRANSURETHRAL RESECTION OF BLADDER TUMOR N/A 12/29/2015   Procedure: TRANSURETHRAL RESECTION OF BLADDER TUMOR (TURBT)/MITOMYCIN INSTILLATION;  Surgeon: Royston Cowper, MD;  Location: [facility] ORS;  Service: Urology;  Laterality: N/A;  . TRANSURETHRAL RESECTION OF BLADDER TUMOR WITH GYRUS (TURBT-GYRUS)  2013  . VASECTOMY      There were no vitals filed for  this visit.  Subjective Assessment - 08/13/18 1558    Subjective  Patient reports no falls or stumbles since last PT session, mentions some pain in his big toe. During ambulation patient does mention an increase in hip pain to 5/10.    Pertinent History  Patient reports his main issue is his balance. States he has a new doctor that refered him to this clinic instead of outpatient in [facility] where he has received physical therapy prior. Has received therapy for balance in the past (approximately 1 year ago). States he has fallen twice recently when he tripped over leg lift of recliner (6 weeks ago), and fell carrying groceries while trying to open door (4 weeks ago). Reports he does have a fear of falling while walking. States he has circulation issues in his legs that has returned and has not told doctor. Completes water aerobics 2-3x a week. Patient states he also has shoulder pain and back pain, and that shoulder hurts is when he raises arm in abduction against resistance and when rolling over in bed. Used to enjoy playing golf. Would like to be able to walk around the block with his wife.     Currently in Pain?  Yes    Pain Score  5     Pain Location  Hip    Pain Orientation  Right;Left   R > L   Pain Descriptors / Indicators  Aching    Pain Onset  More than a month ago       Treatment;  Leg press 120 lbs x 20 x 3   In //bars:  ?  Tandem stand and head turns x 5 with L and R foot leading, significant difficulty due to lack of ankle strategy, heavy use of parallel bars to correct balance.  Modified tandem stance with L and R head turns x5 with L and R foot leading, significant improvement in balance.  Stepping over hurdle fwd/ bwd , side to side x 20 with significant improvement in foot clearance with 1 finger support  Ambulating in hallway with playing cards on walls, identification during ambulation to encourage head turns, correct posture and multitasking x2 rounds, CGA  Ambulating  without AD inside and outside, on even and uneven surfaces, slight incline and decline, tandem walking, head turns both directions, CGA or close supervision throughout. Multiple LOB noted with tandem walking with minAx1 from PT to correct.  Side stepping in hallway with CGA x175ft 2 rounds   Patient requires cueing for exercise technique and task orientation. CGA/close supervision throughout exercises.    PT Education - 08/13/18 1559    Education provided  Yes    Education Details  HEP, therex form/technique    Person(s) Educated  Patient    Methods  Explanation    Comprehension  Verbalized understanding;Returned demonstration;Need further instruction       PT Short Term Goals - 07/10/18 0807      PT SHORT TERM GOAL #1   Title  Patient will be independent with completion of HEP to improve ability to complete functional tasks.    Time  2    Period  Weeks    Status  New    Target Date  07/24/18      PT SHORT TERM GOAL #2   Title  Patient will report no falls in the next two weeks to demonstrate improved safety and fall risk.     Baseline  07/09/18: fell 2x in 6 weeks    Time  2    Period  Weeks    Status  New    Target Date  07/24/18        PT Long Term Goals - 07/10/18 0810      PT LONG TERM GOAL #1   Title  Patient will improve 5x sit to stand to <10seconds without UE support to demonstrate improvements in functional strength.     Baseline  07/09/18: 13sec hands on knees    Time  6    Period  Weeks    Status  New    Target Date  08/21/18      PT LONG TERM GOAL #2   Title  Patient will increase BERG balance score to >52/56 to decrease fall risk and improve balance.     Baseline  07/09/18: 47/56    Time  6    Period  Weeks    Status  New    Target Date  08/21/18      PT LONG TERM GOAL #3   Title  Patient will be able to walk with wife around neighborhood (>39minutes) to demonstrate improved balance and community ambulation.     Baseline  07/09/18: 17minutes     Time  6    Period  Weeks    Status  New    Target Date  08/21/18  PT LONG TERM GOAL #4   Title  Patient will improve 71mwt to >1.27m/s limiting fall risk and improve community ambulation.     Baseline  07/09/18: 0.810m/s    Time  6    Period  Weeks    Status  New    Target Date  08/21/18            Plan - 08/13/18 1615    Clinical Impression Statement  Patient needed CGA/ close supervision throughout session. Patient ambulated on uneven/even terrian, on incline and decline surfaces without AD and CGA with moderate difficulty. Most unsteadiness noted with vertical and horizontal head turns during ambulation, as well as tandem ambulation.     PT Frequency  2x / week    PT Duration  6 weeks    PT Treatment/Interventions  ADLs/Self Care Home Management;Energy conservation;Patient/family education;Therapeutic exercise;Balance training;Neuromuscular re-education;Therapeutic activities;Functional mobility training;Stair training;DME Instruction;Gait training;Aquatic Therapy;Cryotherapy;Electrical Stimulation;Iontophoresis 4mg /ml Dexamethasone;Moist Heat;Traction;Ultrasound;Taping;Passive range of motion;Vestibular;Manual techniques    PT Next Visit Plan  review HEP, strength, balance     PT Home Exercise Plan  standing hip abduction, standing marches, modified tandem stance    Consulted and Agree with Plan of Care  Patient       Patient will benefit from skilled therapeutic intervention in order to improve the following deficits and impairments:  Abnormal gait, Decreased activity tolerance, Decreased balance, Decreased coordination, Decreased endurance, Decreased mobility, Decreased strength, Difficulty walking, Impaired sensation, Pain, Impaired UE functional use, Postural dysfunction, Improper body mechanics, Impaired perceived functional ability, Decreased knowledge of use of DME  Visit Diagnosis: Muscle weakness (generalized)  Unsteady gait  Loss of balance  Gait  difficulty  Impairment of balance  Pain in right hip  Pain in left hip     Problem List There are no active problems to display for this patient.   Lieutenant Diego PT, DPT 4:20 PM,08/13/18 [place] MAIN [facility] SERVICES [address] [place], [place], [address] Phone: [phone]   Fax:  [phone]  Name: Eric Torres MRN: [id number] Date of Birth: [birth year]

## 2018-08-16 ENCOUNTER — Encounter: Payer: Self-pay | Admitting: Physical Therapy

## 2018-08-16 ENCOUNTER — Ambulatory Visit: Payer: Medicare Other | Admitting: Physical Therapy

## 2018-08-16 DIAGNOSIS — R269 Unspecified abnormalities of gait and mobility: Secondary | ICD-10-CM

## 2018-08-16 DIAGNOSIS — M6281 Muscle weakness (generalized): Secondary | ICD-10-CM | POA: Diagnosis not present

## 2018-08-16 DIAGNOSIS — R2689 Other abnormalities of gait and mobility: Secondary | ICD-10-CM

## 2018-08-16 DIAGNOSIS — R2681 Unsteadiness on feet: Secondary | ICD-10-CM

## 2018-08-16 NOTE — Therapy (Signed)
[place] MAIN [facility] SERVICES [address] [place], [place], [address] Phone: [phone]   Fax:  [phone]  Physical Therapy Treatment  Patient Details  Name: Eric Torres MRN: [id number] Date of Birth: 1932/02/04 Referring Provider (PT): Mcneil Sober   Encounter Date: 08/16/2018  PT End of Session - 08/16/18 1549    Visit Number  7    Number of Visits  13    Date for PT Re-Evaluation  08/20/18    Authorization Type  7/10 (PN begins 07/09/18)    PT Start Time  0315    PT Stop Time  0400    PT Time Calculation (min)  45 min    Equipment Utilized During Treatment  Gait belt    Activity Tolerance  Patient tolerated treatment well    Behavior During Therapy  WFL for tasks assessed/performed       Past Medical History:  Diagnosis Date  . BPH (benign prostatic hyperplasia)   . 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 COPD (chronic obstructive pulmonary disease) (HCC)    MILD-NO INHALERS PER PT  . Essential tremor   . Heart murmur   . History of brachytherapy   . History of TIA (transient ischemic attack) 2015   LEFT THUMB AND INDEX FINGER-SOMETIMES I DROP THINGS  . Hypercholesteremia   . Hypertension   . Hypothyroidism     Past Surgical History:  Procedure Laterality Date  . CHOLECYSTECTOMY    . CYSTOSCOPY WITH BIOPSY N/A 12/29/2015   Procedure: CYSTOSCOPY WITH BIOPSY;  Surgeon: Royston Cowper, MD;  Location: [facility] ORS;  Service: Urology;  Laterality: N/A;  . EXPLORATORY LAPAROTOMY  1991   VOLVULUS AND BOWEL OBSTRUCTION  . ILIAC ARTERY STENT Bilateral   . TONSILLECTOMY    . TRANSURETHRAL RESECTION OF BLADDER TUMOR N/A 12/29/2015   Procedure: TRANSURETHRAL RESECTION OF BLADDER TUMOR (TURBT)/MITOMYCIN INSTILLATION;  Surgeon: Royston Cowper, MD;  Location: [facility] ORS;  Service: Urology;  Laterality: N/A;  . TRANSURETHRAL RESECTION OF BLADDER TUMOR WITH GYRUS (TURBT-GYRUS)  2013  . VASECTOMY      There were no vitals filed for  this visit.  Subjective Assessment - 08/16/18 1549    Subjective  Patient reports no falls or stumbles since last PT session, mentions some pain in his big toe. During ambulation patient does mention an increase in hip pain to 5/10.    Pertinent History  Patient reports his main issue is his balance. States he has a new doctor that refered him to this clinic instead of outpatient in [facility] where he has received physical therapy prior. Has received therapy for balance in the past (approximately 1 year ago). States he has fallen twice recently when he tripped over leg lift of recliner (6 weeks ago), and fell carrying groceries while trying to open door (4 weeks ago). Reports he does have a fear of falling while walking. States he has circulation issues in his legs that has returned and has not told doctor. Completes water aerobics 2-3x a week. Patient states he also has shoulder pain and back pain, and that shoulder hurts is when he raises arm in abduction against resistance and when rolling over in bed. Used to enjoy playing golf. Would like to be able to walk around the block with his wife.     Limitations  Standing;Walking;House hold activities    How long can you stand comfortably?  5 minutes    How long can you walk  comfortably?  5 minutes     Patient Stated Goals  walk around block with wife, prevent falls,     Currently in Pain?  Yes    Pain Score  5     Pain Location  Hip    Pain Orientation  Left    Pain Descriptors / Indicators  Aching    Pain Onset  More than a month ago    Aggravating Factors   walking    Pain Relieving Factors  sitting    Effect of Pain on Daily Activities  none    Multiple Pain Sites  No      Treatment:  Airex pad  Hold ball and trunk rotation   x2 min, CGA for safety, demonstrated difficulty with trunk rotation and keeping arms extended  Airex pad, balloon tapping to mirror  x2 min, supervision for safety with varying directions and speed of balloon, VCs for  utilizing both hands and minimizing UE support  Step over hurdle fwd/ bwd, side to side , CGA for safety, VCs to take big enough steps and to try to increase speed to work on coordination   Side stepping x10 on blue balance CGA for safety, VCs for taking a big enough step    Leg press x 100 lbs x 20 x 2  Toe tapping 6 inch stool without UE assist x 20 , cues for technique   Matrix 22.5 lbs fwd/bwd, side to side with min assist x 3 reps   Purple foam  and trunk rotation with yellow thera ball , fwd/bwd, and lateral shifts  with 50% UE support x 2 mins  Tandem standing on 1/2 foam with flat side up and cone stacking on stool with need of min assist x 20 cones x 2 sets    TM side stepping with elevation 2 and . 4 miles / hour , 25 % UE support   Patient performed with instruction, verbal cues, tactile cues of therapist: goal: increase tissue extensibility, promote proper posture, improve mobility                      PT Education - 08/16/18 1549    Education provided  Yes    Education Details  HEP    Person(s) Educated  Patient    Methods  Explanation    Comprehension  Verbalized understanding       PT Short Term Goals - 07/10/18 0807      PT SHORT TERM GOAL #1   Title  Patient will be independent with completion of HEP to improve ability to complete functional tasks.    Time  2    Period  Weeks    Status  New    Target Date  07/24/18      PT SHORT TERM GOAL #2   Title  Patient will report no falls in the next two weeks to demonstrate improved safety and fall risk.     Baseline  07/09/18: fell 2x in 6 weeks    Time  2    Period  Weeks    Status  New    Target Date  07/24/18        PT Long Term Goals - 07/10/18 0810      PT LONG TERM GOAL #1   Title  Patient will improve 5x sit to stand to <10seconds without UE support to demonstrate improvements in functional strength.     Baseline  07/09/18: 13sec hands on knees  Time  6    Period  Weeks     Status  New    Target Date  08/21/18      PT LONG TERM GOAL #2   Title  Patient will increase BERG balance score to >52/56 to decrease fall risk and improve balance.     Baseline  07/09/18: 47/56    Time  6    Period  Weeks    Status  New    Target Date  08/21/18      PT LONG TERM GOAL #3   Title  Patient will be able to walk with wife around neighborhood (>68minutes) to demonstrate improved balance and community ambulation.     Baseline  07/09/18: 30minutes    Time  6    Period  Weeks    Status  New    Target Date  08/21/18      PT LONG TERM GOAL #4   Title  Patient will improve 35mwt to >1.84m/s limiting fall risk and improve community ambulation.     Baseline  07/09/18: 0.897m/s    Time  6    Period  Weeks    Status  New    Target Date  08/21/18            Plan - 08/16/18 1551    Clinical Impression Statement  Patient demonstrates deficits with postural control in tandem and narrow stance on purple foam with EO and EC. Patient demonstrated hesitation with full weight shifting during lunge but minimal cueing resulted in good technique and no LOB.  Patient improved ability to challenge dynamic balance with supervision and without UE assist today.  Patient shows good BLE strength and hip control during DL leg press. Patient will continue to benefit from skilled physical therapy to improve endurance and dynamic balance to reduce fall risk.    PT Frequency  2x / week    PT Duration  6 weeks    PT Treatment/Interventions  ADLs/Self Care Home Management;Energy conservation;Patient/family education;Therapeutic exercise;Balance training;Neuromuscular re-education;Therapeutic activities;Functional mobility training;Stair training;DME Instruction;Gait training;Aquatic Therapy;Cryotherapy;Electrical Stimulation;Iontophoresis 4mg /ml Dexamethasone;Moist Heat;Traction;Ultrasound;Taping;Passive range of motion;Vestibular;Manual techniques    PT Next Visit Plan  review HEP, strength, balance      PT Home Exercise Plan  standing hip abduction, standing marches, modified tandem stance    Consulted and Agree with Plan of Care  Patient       Patient will benefit from skilled therapeutic intervention in order to improve the following deficits and impairments:  Abnormal gait, Decreased activity tolerance, Decreased balance, Decreased coordination, Decreased endurance, Decreased mobility, Decreased strength, Difficulty walking, Impaired sensation, Pain, Impaired UE functional use, Postural dysfunction, Improper body mechanics, Impaired perceived functional ability, Decreased knowledge of use of DME  Visit Diagnosis: Unsteady gait  Muscle weakness (generalized)  Loss of balance  Gait difficulty  Impairment of balance     Problem List There are no active problems to display for this patient.   [address] [place] DPT 08/16/2018, 3:52 PM  [place] MAIN [facility] SERVICES [address] [place], [place], [address] Phone: [phone]   Fax:  [phone]  Name: Eric Torres MRN: [id number] Date of Birth: 1931/10/27

## 2018-08-21 ENCOUNTER — Encounter: Payer: Self-pay | Admitting: Physical Therapy

## 2018-08-21 ENCOUNTER — Ambulatory Visit: Payer: Medicare Other | Admitting: Physical Therapy

## 2018-08-21 DIAGNOSIS — R2689 Other abnormalities of gait and mobility: Secondary | ICD-10-CM

## 2018-08-21 DIAGNOSIS — R269 Unspecified abnormalities of gait and mobility: Secondary | ICD-10-CM

## 2018-08-21 DIAGNOSIS — M6281 Muscle weakness (generalized): Secondary | ICD-10-CM

## 2018-08-21 DIAGNOSIS — M25551 Pain in right hip: Secondary | ICD-10-CM

## 2018-08-21 DIAGNOSIS — M25552 Pain in left hip: Secondary | ICD-10-CM

## 2018-08-21 DIAGNOSIS — R2681 Unsteadiness on feet: Secondary | ICD-10-CM

## 2018-08-21 NOTE — Therapy (Addendum)
[place] MAIN [facility] SERVICES [address] [place], [place], [address] Phone: [phone]   Fax:  [phone]  Physical Therapy Treatment  Patient Details  Name: Eric Torres MRN: [id number] Date of Birth: Jan 16, 1932 Referring Provider (PT): Mcneil Sober   Encounter Date: 08/21/2018  PT End of Session - 08/21/18 1701    Visit Number  8    Number of Visits  13    Date for PT Re-Evaluation  08/20/18    Authorization Type  8/10 (PN begins 07/09/18)    PT Start Time  0430    PT Stop Time  0515    PT Time Calculation (min)  45 min    Equipment Utilized During Treatment  Gait belt    Activity Tolerance  Patient tolerated treatment well    Behavior During Therapy  WFL for tasks assessed/performed       Past Medical History:  Diagnosis Date  . BPH (benign prostatic hyperplasia)   . 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 COPD (chronic obstructive pulmonary disease) (HCC)    MILD-NO INHALERS PER PT  . Essential tremor   . Heart murmur   . History of brachytherapy   . History of TIA (transient ischemic attack) 2015   LEFT THUMB AND INDEX FINGER-SOMETIMES I DROP THINGS  . Hypercholesteremia   . Hypertension   . Hypothyroidism     Past Surgical History:  Procedure Laterality Date  . CHOLECYSTECTOMY    . CYSTOSCOPY WITH BIOPSY N/A 12/29/2015   Procedure: CYSTOSCOPY WITH BIOPSY;  Surgeon: Royston Cowper, MD;  Location: [facility] ORS;  Service: Urology;  Laterality: N/A;  . EXPLORATORY LAPAROTOMY  1991   VOLVULUS AND BOWEL OBSTRUCTION  . ILIAC ARTERY STENT Bilateral   . TONSILLECTOMY    . TRANSURETHRAL RESECTION OF BLADDER TUMOR N/A 12/29/2015   Procedure: TRANSURETHRAL RESECTION OF BLADDER TUMOR (TURBT)/MITOMYCIN INSTILLATION;  Surgeon: Royston Cowper, MD;  Location: [facility] ORS;  Service: Urology;  Laterality: N/A;  . TRANSURETHRAL RESECTION OF BLADDER TUMOR WITH GYRUS (TURBT-GYRUS)  2013  . VASECTOMY      There were no vitals filed for  this visit.  Subjective Assessment - 08/21/18 1700    Subjective  Patient reports no falls or stumbles since last PT session, mentions some pain in his big toe. During ambulation patient does mention an increase in hip pain to 5/10.    Pertinent History  Patient reports his main issue is his balance. States he has a new doctor that refered him to this clinic instead of outpatient in [facility] where he has received physical therapy prior. Has received therapy for balance in the past (approximately 1 year ago). States he has fallen twice recently when he tripped over leg lift of recliner (6 weeks ago), and fell carrying groceries while trying to open door (4 weeks ago). Reports he does have a fear of falling while walking. States he has circulation issues in his legs that has returned and has not told doctor. Completes water aerobics 2-3x a week. Patient states he also has shoulder pain and back pain, and that shoulder hurts is when he raises arm in abduction against resistance and when rolling over in bed. Used to enjoy playing golf. Would like to be able to walk around the block with his wife.     Limitations  Standing;Walking;House hold activities    How long can you stand comfortably?  5 minutes    How long can you walk  comfortably?  5 minutes     Patient Stated Goals  walk around block with wife, prevent falls,     Currently in Pain?  No/denies    Pain Score  0-No pain    Pain Onset  More than a month ago    Multiple Pain Sites  No       Treatment: Outcome measures performed with some progress made towards Berg and 10 MW  Ther-ex  TM . 4 miles / hour side stepping and min asssit x 2 mins Standing hip abd with 2# ankle weight x 15 BLE and  BUE support  Standing hip extension with 2# ankle weight x 10 BLE with BUE support  Standing hip flexion marches with 2# ankle weight x 10 BLE with BUE support  Standing HS curls with 2# ankle weight x 10 BLE with BUE support  Standing heel raises x 15 with  2# ankle weights  LAQ with 2# ankle weights with 3 second holds x10 each LE  STS from chair without UE support 2x10  Mini squats x10 with no UE support      Neuromuscular Re-education  Toe taps to 6" step without UE support alternating LE x15 each  Normal stance on Airex pad 2x60sec (requires minA for falls recovery 2 times)?  Forward and backward stepping over hurdle x15 each direction  SIde stepping over hurdle 15x each direction   Patient has poor safety and judgement during TM exericise.                       PT Education - 08/21/18 1701    Education provided  Yes    Education Details  safety    Person(s) Educated  Patient    Methods  Explanation    Comprehension  Verbalized understanding       PT Short Term Goals - 07/10/18 0807      PT SHORT TERM GOAL #1   Title  Patient will be independent with completion of HEP to improve ability to complete functional tasks.    Time  2    Period  Weeks    Status  New    Target Date  07/24/18      PT SHORT TERM GOAL #2   Title  Patient will report no falls in the next two weeks to demonstrate improved safety and fall risk.     Baseline  07/09/18: fell 2x in 6 weeks    Time  2    Period  Weeks    Status  New    Target Date  07/24/18        PT Long Term Goals - 07/10/18 0810      PT LONG TERM GOAL #1   Title  Patient will improve 5x sit to stand to <10seconds without UE support to demonstrate improvements in functional strength.     Baseline  07/09/18: 13sec hands on knees ; 08/21/18 16.32   Time  6    Period  Weeks    Status  on going   Target Date 10/16/18     PT LONG TERM GOAL #2   Title  Patient will increase BERG balance score to >52/56 to decrease fall risk and improve balance.     Baseline  07/09/18: 47/56 11/121/9 48/56   Time  6    Period  Weeks    Status  on going   Target Date  10/16/18     PT LONG TERM GOAL #  3   Title  Patient will be able to walk with wife around neighborhood (>79minutes)  to demonstrate improved balance and community ambulation.     Baseline  07/09/18: 75minutes , 08/21/18 5 mins   Time  6    Period  Weeks    Status  on going   Target Date  10/16/18     PT LONG TERM GOAL #4   Title  Patient will improve 23mwt to >1.42m/s limiting fall risk and improve community ambulation.     Baseline  07/09/18: 0.833m/s , 08/21/18 . 90 m/sec    Time  6    Period  Weeks    Status  on going    Target Date  10/16/18           Plan - 08/21/18 1718    Clinical Impression Statement  Patient demonstrates deficits with postural control in tandem and narrow stance on purple foam with EO and EC. Patient demonstrated hesitation with full weight shifting during lunge but minimal cueing resulted in good technique and no LOB. Patient improved ability to challenge dynamic balance with supervision and without UE assist today. Patient shows good BLE strength and hip control during DL leg press. Patient will continue to benefit from skilled physical therapy to improve endurance and dynamic balance to reduce fall risk.    PT Frequency  2x / week    PT Duration  6 weeks    PT Treatment/Interventions  ADLs/Self Care Home Management;Energy conservation;Patient/family education;Therapeutic exercise;Balance training;Neuromuscular re-education;Therapeutic activities;Functional mobility training;Stair training;DME Instruction;Gait training;Aquatic Therapy;Cryotherapy;Electrical Stimulation;Iontophoresis 4mg /ml Dexamethasone;Moist Heat;Traction;Ultrasound;Taping;Passive range of motion;Vestibular;Manual techniques    PT Next Visit Plan  review HEP, strength, balance     PT Home Exercise Plan  standing hip abduction, standing marches, modified tandem stance    Consulted and Agree with Plan of Care  Patient       Patient will benefit from skilled therapeutic intervention in order to improve the following deficits and impairments:  Abnormal gait, Decreased activity tolerance, Decreased balance,  Decreased coordination, Decreased endurance, Decreased mobility, Decreased strength, Difficulty walking, Impaired sensation, Pain, Impaired UE functional use, Postural dysfunction, Improper body mechanics, Impaired perceived functional ability, Decreased knowledge of use of DME  Visit Diagnosis: Unsteady gait  Muscle weakness (generalized)  Loss of balance  Gait difficulty  Impairment of balance  Pain in right hip  Pain in left hip     Problem List There are no active problems to display for this patient.   [address], [place] DPT 08/21/2018, 5:19 PM  [place] MAIN [facility] SERVICES [address] [place], [place], [address] Phone: [phone]   Fax:  [phone]  Name: Eric Torres MRN: [id number] Date of Birth: 1932/05/27

## 2018-08-21 NOTE — Addendum Note (Signed)
Addended by: Alanson Puls on: 08/21/2018 05:53 PM   Modules accepted: Orders

## 2018-08-28 ENCOUNTER — Ambulatory Visit: Payer: Medicare Other | Admitting: Physical Therapy

## 2018-08-28 DIAGNOSIS — R2689 Other abnormalities of gait and mobility: Secondary | ICD-10-CM

## 2018-08-28 DIAGNOSIS — M25552 Pain in left hip: Secondary | ICD-10-CM

## 2018-08-28 DIAGNOSIS — R269 Unspecified abnormalities of gait and mobility: Secondary | ICD-10-CM

## 2018-08-28 DIAGNOSIS — R2681 Unsteadiness on feet: Secondary | ICD-10-CM

## 2018-08-28 DIAGNOSIS — M25551 Pain in right hip: Secondary | ICD-10-CM

## 2018-08-28 DIAGNOSIS — M6281 Muscle weakness (generalized): Secondary | ICD-10-CM | POA: Diagnosis not present

## 2018-08-29 ENCOUNTER — Encounter: Payer: Medicare Other | Admitting: Physical Therapy

## 2018-08-29 NOTE — Therapy (Signed)
[place] MAIN [facility] SERVICES [address] [place], [place], [address] Phone: [phone]   Fax:  [phone]  Physical Therapy Treatment  Patient Details  Name: Eric Torres MRN: [id number] Date of Birth: [birth year] Referring Provider (PT): Mcneil Sober   Encounter Date: 08/28/2018  PT End of Session - 08/28/18 1608    Visit Number  9    Number of Visits  25    Date for PT Re-Evaluation  10/16/18    Authorization Type  1/10 (PN begins 08/21/2018)    PT Start Time  1300    PT Stop Time  1340    PT Time Calculation (min)  40 min    Equipment Utilized During Treatment  Gait belt    Activity Tolerance  Patient tolerated treatment well    Behavior During Therapy  WFL for tasks assessed/performed       Past Medical History:  Diagnosis Date  . BPH (benign prostatic hyperplasia)   . 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 COPD (chronic obstructive pulmonary disease) (HCC)    MILD-NO INHALERS PER PT  . Essential tremor   . Heart murmur   . History of brachytherapy   . History of TIA (transient ischemic attack) 2015   LEFT THUMB AND INDEX FINGER-SOMETIMES I DROP THINGS  . Hypercholesteremia   . Hypertension   . Hypothyroidism     Past Surgical History:  Procedure Laterality Date  . CHOLECYSTECTOMY    . CYSTOSCOPY WITH BIOPSY N/A 12/29/2015   Procedure: CYSTOSCOPY WITH BIOPSY;  Surgeon: Royston Cowper, MD;  Location: [facility] ORS;  Service: Urology;  Laterality: N/A;  . EXPLORATORY LAPAROTOMY  1991   VOLVULUS AND BOWEL OBSTRUCTION  . ILIAC ARTERY STENT Bilateral   . TONSILLECTOMY    . TRANSURETHRAL RESECTION OF BLADDER TUMOR N/A 12/29/2015   Procedure: TRANSURETHRAL RESECTION OF BLADDER TUMOR (TURBT)/MITOMYCIN INSTILLATION;  Surgeon: Royston Cowper, MD;  Location: [facility] ORS;  Service: Urology;  Laterality: N/A;  . TRANSURETHRAL RESECTION OF BLADDER TUMOR WITH GYRUS (TURBT-GYRUS)  2013  . VASECTOMY      There were no vitals filed for  this visit.  Subjective Assessment - 08/28/18 1309    Subjective  Patient states that he has not fallen or lost his balance since the last session. Patient does report that he does not feel confident descending steps and would like to work on things that translate from the gym to his life.    Pertinent History  Patient reports his main issue is his balance. States he has a new doctor that refered him to this clinic instead of outpatient in [facility] where he has received physical therapy prior. Has received therapy for balance in the past (approximately 1 year ago). States he has fallen twice recently when he tripped over leg lift of recliner (6 weeks ago), and fell carrying groceries while trying to open door (4 weeks ago). Reports he does have a fear of falling while walking. States he has circulation issues in his legs that has returned and has not told doctor. Completes water aerobics 2-3x a week. Patient states he also has shoulder pain and back pain, and that shoulder hurts is when he raises arm in abduction against resistance and when rolling over in bed. Used to enjoy playing golf. Would like to be able to walk around the block with his wife.     Limitations  Standing;Walking;House hold activities    How long can you stand  comfortably?  5 minutes    How long can you walk comfortably?  5 minutes     Patient Stated Goals  walk around block with wife, prevent falls,     Currently in Pain?  No/denies    Pain Score  0-No pain    Pain Onset  More than a month ago       TREATMENT Neuromuscular Re-education  // bars: Normal stance on Airex pad 2x60sec, CGA and VCs to prevent loss of balance Airex pad toe taps to 6" step faded UE support x10 each leg, CGA and VCs to prevent loss of balance (2 times)  Stair training (5 steps, single railing LUE for descent faded support) x4 times, CGA/MIN Ax1 with extensive education and VCs for pace of movement, whole foot placement on stair tread, step-to pattern  when not using UE support. Patient had 4 losses of balance after completing the descent of 5 stairs and placing both feet on the floor. Patient able to recover balance with UE support and MIN A x1. Patient did not acknowledge LOB or dismissed LOB.  Ambulation in the hallway 4x80 feet with head turns and speed changes, no AD, CGA/MIN A. Again, patient dismissed any unsteadiness of gait. VCs for safety awareness provided throughout.  Seated rest breaks provided throughout the session with education on the importance of points of contact and speed of movement with postural changes to prevent LOB/falls. Patient articulated understanding.   PT Education - 08/29/18 1607    Education provided  Yes    Education Details  balance strategies, safety awareness: slow and accurate > fast and inaccurate    Person(s) Educated  Patient    Methods  Explanation;Demonstration;Verbal cues    Comprehension  Verbalized understanding;Returned demonstration;Need further instruction;Verbal cues required       PT Short Term Goals - 07/10/18 0807      PT SHORT TERM GOAL #1   Title  Patient will be independent with completion of HEP to improve ability to complete functional tasks.    Time  2    Period  Weeks    Status  New    Target Date  07/24/18      PT SHORT TERM GOAL #2   Title  Patient will report no falls in the next two weeks to demonstrate improved safety and fall risk.     Baseline  07/09/18: fell 2x in 6 weeks    Time  2    Period  Weeks    Status  New    Target Date  07/24/18        PT Long Term Goals - 07/10/18 0810      PT LONG TERM GOAL #1   Title  Patient will improve 5x sit to stand to <10seconds without UE support to demonstrate improvements in functional strength.     Baseline  07/09/18: 13sec hands on knees    Time  6    Period  Weeks    Status  New    Target Date  08/21/18      PT LONG TERM GOAL #2   Title  Patient will increase BERG balance score to >52/56 to decrease fall risk  and improve balance.     Baseline  07/09/18: 47/56    Time  6    Period  Weeks    Status  New    Target Date  08/21/18      PT LONG TERM GOAL #3   Title  Patient will  be able to walk with wife around neighborhood (>80minutes) to demonstrate improved balance and community ambulation.     Baseline  07/09/18: 32minutes    Time  6    Period  Weeks    Status  New    Target Date  08/21/18      PT LONG TERM GOAL #4   Title  Patient will improve 33mwt to >1.74m/s limiting fall risk and improve community ambulation.     Baseline  07/09/18: 0.841m/s    Time  6    Period  Weeks    Status  New    Target Date  08/21/18            Plan - 08/29/18 1719    Clinical Impression Statement  Patient presents to clinic with complaints of difficulty descending steps due to decreased confidence and increased fear of falling, Patient continues to demonstrate balance deficits as evidenced by 3 LOB when completing 5 stair descent with SBA/CGA and SUE support. Patient demonstrates improving static balance on a compliant surface with narrow stance and challenges to the visual system with use of ankle strategy to maintain postural control. Patient continues to benefit from verbal cues for safety awareness and pace of movement. Patient consistently experienced unsteadiness/ minor LOB with transitional movements: change in walking surface, change in height (stairs, STS, bending forward) which require tactile and verbal cues as well as UE support for correction. Patient will continue to benefit from skilled therapeutic intervention to address deficits in balance and safety awareness for improved mobility and decreased risk of falls.    PT Frequency  2x / week    PT Duration  6 weeks    PT Treatment/Interventions  ADLs/Self Care Home Management;Energy conservation;Patient/family education;Therapeutic exercise;Balance training;Neuromuscular re-education;Therapeutic activities;Functional mobility training;Stair  training;DME Instruction;Gait training;Aquatic Therapy;Cryotherapy;Electrical Stimulation;Iontophoresis 4mg /ml Dexamethasone;Moist Heat;Traction;Ultrasound;Taping;Passive range of motion;Vestibular;Manual techniques    PT Next Visit Plan  review HEP, strength, balance     PT Home Exercise Plan  standing hip abduction, standing marches, modified tandem stance    Consulted and Agree with Plan of Care  Patient       Patient will benefit from skilled therapeutic intervention in order to improve the following deficits and impairments:  Abnormal gait, Decreased activity tolerance, Decreased balance, Decreased coordination, Decreased endurance, Decreased mobility, Decreased strength, Difficulty walking, Impaired sensation, Pain, Impaired UE functional use, Postural dysfunction, Improper body mechanics, Impaired perceived functional ability, Decreased knowledge of use of DME  Visit Diagnosis: Unsteady gait  Pain in left hip  Muscle weakness (generalized)  Loss of balance  Gait difficulty  Impairment of balance  Pain in right hip     Problem List There are no active problems to display for this patient.  Myles Gip PT, DPT [phone] 08/29/2018, 5:28 PM  [place] MAIN [facility] SERVICES [address] [place], [place], [address] Phone: [phone]   Fax:  [phone]  Name: Eric Torres MRN: [id number] Date of Birth: 1931/12/20

## 2018-08-30 ENCOUNTER — Ambulatory Visit: Payer: Medicare Other

## 2018-08-30 DIAGNOSIS — R2689 Other abnormalities of gait and mobility: Secondary | ICD-10-CM

## 2018-08-30 DIAGNOSIS — M6281 Muscle weakness (generalized): Secondary | ICD-10-CM | POA: Diagnosis not present

## 2018-08-30 DIAGNOSIS — R2681 Unsteadiness on feet: Secondary | ICD-10-CM

## 2018-08-30 DIAGNOSIS — R269 Unspecified abnormalities of gait and mobility: Secondary | ICD-10-CM

## 2018-08-30 NOTE — Therapy (Signed)
[place] MAIN [facility] SERVICES [address] [place], [place], [address] Phone: [phone]   Fax:  [phone]  Physical Therapy Treatment  Patient Details  Name: Eric Torres MRN: [id number] Date of Birth: 1932-09-03 Referring Provider (PT): Mcneil Sober   Encounter Date: 08/30/2018  PT End of Session - 08/30/18 1132    Visit Number  10    Number of Visits  25    Date for PT Re-Evaluation  10/16/18    Authorization Type  2/10 (PN begins 08/21/2018)    PT Start Time  1116    PT Stop Time  1200    PT Time Calculation (min)  44 min    Equipment Utilized During Treatment  Gait belt    Activity Tolerance  Patient tolerated treatment well    Behavior During Therapy  WFL for tasks assessed/performed       Past Medical History:  Diagnosis Date  . BPH (benign prostatic hyperplasia)   . 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 COPD (chronic obstructive pulmonary disease) (HCC)    MILD-NO INHALERS PER PT  . Essential tremor   . Heart murmur   . History of brachytherapy   . History of TIA (transient ischemic attack) 2015   LEFT THUMB AND INDEX FINGER-SOMETIMES I DROP THINGS  . Hypercholesteremia   . Hypertension   . Hypothyroidism     Past Surgical History:  Procedure Laterality Date  . CHOLECYSTECTOMY    . CYSTOSCOPY WITH BIOPSY N/A 12/29/2015   Procedure: CYSTOSCOPY WITH BIOPSY;  Surgeon: Royston Cowper, MD;  Location: [facility] ORS;  Service: Urology;  Laterality: N/A;  . EXPLORATORY LAPAROTOMY  1991   VOLVULUS AND BOWEL OBSTRUCTION  . ILIAC ARTERY STENT Bilateral   . TONSILLECTOMY    . TRANSURETHRAL RESECTION OF BLADDER TUMOR N/A 12/29/2015   Procedure: TRANSURETHRAL RESECTION OF BLADDER TUMOR (TURBT)/MITOMYCIN INSTILLATION;  Surgeon: Royston Cowper, MD;  Location: [facility] ORS;  Service: Urology;  Laterality: N/A;  . TRANSURETHRAL RESECTION OF BLADDER TUMOR WITH GYRUS (TURBT-GYRUS)  2013  . VASECTOMY      There were no vitals filed  for this visit.  Subjective Assessment - 08/30/18 1203    Subjective  Patient reported no falls or LOB's since last visit.  Reports stairs, loading/unloading dishwasher and stepping on garbage can lever is challenging for him.    Pertinent History  Patient reports his main issue is his balance. States he has a new doctor that refered him to this clinic instead of outpatient in [facility] where he has received physical therapy prior. Has received therapy for balance in the past (approximately 1 year ago). States he has fallen twice recently when he tripped over leg lift of recliner (6 weeks ago), and fell carrying groceries while trying to open door (4 weeks ago). Reports he does have a fear of falling while walking. States he has circulation issues in his legs that has returned and has not told doctor. Completes water aerobics 2-3x a week. Patient states he also has shoulder pain and back pain, and that shoulder hurts is when he raises arm in abduction against resistance and when rolling over in bed. Used to enjoy playing golf. Would like to be able to walk around the block with his wife.     Limitations  Standing;Walking;House hold activities    How long can you stand comfortably?  5 minutes    How long can you walk comfortably?  5 minutes  Patient Stated Goals  walk around block with wife, prevent falls,     Currently in Pain?  No/denies     Standing balloon taps on airexpad reaching in/out of BOS x2 min   Basketball shoot standing on blue foam in parallel bars, CGA x2 minutes  Standing step overs orange hurdle x10 with each LE. Verbal cues for lifting knee to ceiling for foot clearance. No UE support. CGA. 1 LOB   Eccentric step down front of 4 in. step x10 each LE. BUE faded to 1UE support. CGA. Verbal cues to tap heel.   Side stepping with turning to retrieve cones and place on chair at contralateral side to simulate dishwasher loading/unloading in parallel bars; x5 cones placed on step  gradually lowering height.  CGA; 2 sets   Walking in hallway with lateral head turns identifying playing cards on wall  2x86 ft, CGA   Lateral ball toss, 2x 86 ft, CGA  Bouncing ball to self, 2x 86 ft, CGA experiencing frequent LOB's self corrected and with assistance from PT  Lateral side step lunge onto BOSU in parallel bars, CGA x10  Forward stepping lunge onto BOSU in parallel bars, CGA x10 B LE  Modified SLS with basketball in parallel bars x30 sec BLE. CGA. No UE support  Standing tapping stepping stones x10 with each LE. CGA in parallel bars. No UE support                               PT Education - 08/30/18 1130    Education provided  Yes    Education Details  balance strategies, exercise technique, safety awareness    Person(s) Educated  Patient    Methods  Explanation;Demonstration;Verbal cues    Comprehension  Verbalized understanding;Returned demonstration;Need further instruction       PT Short Term Goals - 07/10/18 0807      PT SHORT TERM GOAL #1   Title  Patient will be independent with completion of HEP to improve ability to complete functional tasks.    Time  2    Period  Weeks    Status  New    Target Date  07/24/18      PT SHORT TERM GOAL #2   Title  Patient will report no falls in the next two weeks to demonstrate improved safety and fall risk.     Baseline  07/09/18: fell 2x in 6 weeks    Time  2    Period  Weeks    Status  New    Target Date  07/24/18        PT Long Term Goals - 07/10/18 0810      PT LONG TERM GOAL #1   Title  Patient will improve 5x sit to stand to <10seconds without UE support to demonstrate improvements in functional strength.     Baseline  07/09/18: 13sec hands on knees    Time  6    Period  Weeks    Status  New    Target Date  08/21/18      PT LONG TERM GOAL #2   Title  Patient will increase BERG balance score to >52/56 to decrease fall risk and improve balance.     Baseline  07/09/18: 47/56     Time  6    Period  Weeks    Status  New    Target Date  08/21/18  PT LONG TERM GOAL #3   Title  Patient will be able to walk with wife around neighborhood (>12minutes) to demonstrate improved balance and community ambulation.     Baseline  07/09/18: 81minutes    Time  6    Period  Weeks    Status  New    Target Date  08/21/18      PT LONG TERM GOAL #4   Title  Patient will improve 46mwt to >1.10m/s limiting fall risk and improve community ambulation.     Baseline  07/09/18: 0.836m/s    Time  6    Period  Weeks    Status  New    Target Date  08/21/18            Plan - 08/30/18 1158    Clinical Impression Statement  Treatment focused today on simulation of functional household activities including loading/loading dishwasher and descending stairs.  Pt was able to complete all static tasks without difficulty, CGA.  Pt did present with difficulty with ambulation in hallway in conjunction with dynamic tasks, experiencing lateral gait deviations, changes in gait speed and requiring assistance to correct 4-5 lateral LOB's by PT.  Patient will continue to benefit from skilled therapeutic intervention to address deficits in balance and safety awareness for improved mobility and decreased risk of falls.    PT Frequency  2x / week    PT Duration  6 weeks    PT Treatment/Interventions  ADLs/Self Care Home Management;Energy conservation;Patient/family education;Therapeutic exercise;Balance training;Neuromuscular re-education;Therapeutic activities;Functional mobility training;Stair training;DME Instruction;Gait training;Aquatic Therapy;Cryotherapy;Electrical Stimulation;Iontophoresis 4mg /ml Dexamethasone;Moist Heat;Traction;Ultrasound;Taping;Passive range of motion;Vestibular;Manual techniques    PT Next Visit Plan  review HEP, strength, balance     PT Home Exercise Plan  standing hip abduction, standing marches, modified tandem stance    Consulted and Agree with Plan of Care  Patient        Patient will benefit from skilled therapeutic intervention in order to improve the following deficits and impairments:  Abnormal gait, Decreased activity tolerance, Decreased balance, Decreased coordination, Decreased endurance, Decreased mobility, Decreased strength, Difficulty walking, Impaired sensation, Pain, Impaired UE functional use, Postural dysfunction, Improper body mechanics, Impaired perceived functional ability, Decreased knowledge of use of DME  Visit Diagnosis: Unsteady gait  Muscle weakness (generalized)  Loss of balance  Gait difficulty  Impairment of balance     Problem List There are no active problems to display for this patient.  Janna Arch, PT, DPT   08/30/2018, 12:17 PM  [place] MAIN [facility] SERVICES [address] [place], [place], [address] Phone: [phone]   Fax:  [phone]  Name: Eric Torres MRN: [id number] Date of Birth: [birth year]

## 2018-09-05 ENCOUNTER — Ambulatory Visit: Payer: Medicare Other

## 2018-09-05 DIAGNOSIS — R2681 Unsteadiness on feet: Secondary | ICD-10-CM

## 2018-09-05 DIAGNOSIS — M6281 Muscle weakness (generalized): Secondary | ICD-10-CM | POA: Diagnosis not present

## 2018-09-05 DIAGNOSIS — R2689 Other abnormalities of gait and mobility: Secondary | ICD-10-CM

## 2018-09-05 NOTE — Therapy (Signed)
[place] MAIN [facility] SERVICES [address] [place], [place], [address] Phone: [phone]   Fax:  [phone]  Physical Therapy Treatment  Patient Details  Name: Eric Torres MRN: [id number] Date of Birth: [birth year] Referring Provider (PT): Mcneil Sober   Encounter Date: 09/05/2018  PT End of Session - 09/05/18 1524    Visit Number  11    Number of Visits  25    Date for PT Re-Evaluation  10/16/18    Authorization Type  3/10 (PN begins 08/21/2018)    PT Start Time  1519    PT Stop Time  1600    PT Time Calculation (min)  41 min    Equipment Utilized During Treatment  Gait belt    Activity Tolerance  Patient tolerated treatment well    Behavior During Therapy  WFL for tasks assessed/performed       Past Medical History:  Diagnosis Date  . BPH (benign prostatic hyperplasia)   . 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 COPD (chronic obstructive pulmonary disease) (HCC)    MILD-NO INHALERS PER PT  . Essential tremor   . Heart murmur   . History of brachytherapy   . History of TIA (transient ischemic attack) 2015   LEFT THUMB AND INDEX FINGER-SOMETIMES I DROP THINGS  . Hypercholesteremia   . Hypertension   . Hypothyroidism     Past Surgical History:  Procedure Laterality Date  . CHOLECYSTECTOMY    . CYSTOSCOPY WITH BIOPSY N/A 12/29/2015   Procedure: CYSTOSCOPY WITH BIOPSY;  Surgeon: Royston Cowper, MD;  Location: [facility] ORS;  Service: Urology;  Laterality: N/A;  . EXPLORATORY LAPAROTOMY  1991   VOLVULUS AND BOWEL OBSTRUCTION  . ILIAC ARTERY STENT Bilateral   . TONSILLECTOMY    . TRANSURETHRAL RESECTION OF BLADDER TUMOR N/A 12/29/2015   Procedure: TRANSURETHRAL RESECTION OF BLADDER TUMOR (TURBT)/MITOMYCIN INSTILLATION;  Surgeon: Royston Cowper, MD;  Location: [facility] ORS;  Service: Urology;  Laterality: N/A;  . TRANSURETHRAL RESECTION OF BLADDER TUMOR WITH GYRUS (TURBT-GYRUS)  2013  . VASECTOMY      There were no vitals filed  for this visit.  Subjective Assessment - 09/05/18 1522    Subjective  Patient reports he was slightly late due to severe traffic from upcoming holiday. Reports no aches, pains, and no falls.     Pertinent History  Patient reports his main issue is his balance. States he has a new doctor that refered him to this clinic instead of outpatient in [facility] where he has received physical therapy prior. Has received therapy for balance in the past (approximately 1 year ago). States he has fallen twice recently when he tripped over leg lift of recliner (6 weeks ago), and fell carrying groceries while trying to open door (4 weeks ago). Reports he does have a fear of falling while walking. States he has circulation issues in his legs that has returned and has not told doctor. Completes water aerobics 2-3x a week. Patient states he also has shoulder pain and back pain, and that shoulder hurts is when he raises arm in abduction against resistance and when rolling over in bed. Used to enjoy playing golf. Would like to be able to walk around the block with his wife.     Limitations  Standing;Walking;House hold activities    How long can you stand comfortably?  5 minutes    How long can you walk comfortably?  5 minutes  Patient Stated Goals  walk around block with wife, prevent falls,     Currently in Pain?  No/denies       Standing balloon taps on airexpad reaching in/out of BOS x2 min    Basketball shoot standing on blue foam with modified tandem stance in parallel bars, CGA x2 minutes   Standing step overs orange hurdle x10 with each LE. Verbal cues for lifting knee to ceiling for foot clearance. No UE support. CGA. 1 LOB   Eccentric step down front of 4 in. step x10 each LE. BUE faded to 1UE support. CGA. Verbal cues to tap heel.   Airex pad: toe taps 4" step 10x each LE, no UE support.    Walking in hallway with lateral head turns identifying playing cards on wall  2x86 ft, CGA              Lateral  ball toss, 2x 86 ft, CGA             Bouncing ball to self, 2x 86 ft, CGA experiencing frequent LOB's self corrected and with assistance from PT  Rotation passes to PT 2x 86 ft  5lb ankle weights marching 10x each LE.   Half foam roller stability in // bars 2x 60 seconds, excessive trunk flexion resulting in LOB.                            PT Education - 09/05/18 1523    Education provided  Yes    Education Details  balance strategies, exercise technique, safety awareness.     Person(s) Educated  Patient    Methods  Explanation;Demonstration;Verbal cues    Comprehension  Verbalized understanding;Returned demonstration       PT Short Term Goals - 07/10/18 0807      PT SHORT TERM GOAL #1   Title  Patient will be independent with completion of HEP to improve ability to complete functional tasks.    Time  2    Period  Weeks    Status  New    Target Date  07/24/18      PT SHORT TERM GOAL #2   Title  Patient will report no falls in the next two weeks to demonstrate improved safety and fall risk.     Baseline  07/09/18: fell 2x in 6 weeks    Time  2    Period  Weeks    Status  New    Target Date  07/24/18        PT Long Term Goals - 07/10/18 0810      PT LONG TERM GOAL #1   Title  Patient will improve 5x sit to stand to <10seconds without UE support to demonstrate improvements in functional strength.     Baseline  07/09/18: 13sec hands on knees    Time  6    Period  Weeks    Status  New    Target Date  08/21/18      PT LONG TERM GOAL #2   Title  Patient will increase BERG balance score to >52/56 to decrease fall risk and improve balance.     Baseline  07/09/18: 47/56    Time  6    Period  Weeks    Status  New    Target Date  08/21/18      PT LONG TERM GOAL #3   Title  Patient will be able to walk with wife  around neighborhood (>66minutes) to demonstrate improved balance and community ambulation.     Baseline  07/09/18: 77minutes    Time  6     Period  Weeks    Status  New    Target Date  08/21/18      PT LONG TERM GOAL #4   Title  Patient will improve 39mwt to >1.32m/s limiting fall risk and improve community ambulation.     Baseline  07/09/18: 0.837m/s    Time  6    Period  Weeks    Status  New    Target Date  08/21/18            Plan - 09/05/18 1600    Clinical Impression Statement  Patient challenged by ankle righting responses with limited stability on half foam roller and in tandem stance on unstable surface. Patient is limited in stability without UE support due to limited single limb stance, repeated tasks improved in stability as patient's confidence grew. Patient will continue to benefit from skilled therapeutic intervention to address deficits in balance and safety awareness for improved mobility and decreased risk of falls.    PT Frequency  2x / week    PT Duration  6 weeks    PT Treatment/Interventions  ADLs/Self Care Home Management;Energy conservation;Patient/family education;Therapeutic exercise;Balance training;Neuromuscular re-education;Therapeutic activities;Functional mobility training;Stair training;DME Instruction;Gait training;Aquatic Therapy;Cryotherapy;Electrical Stimulation;Iontophoresis 4mg /ml Dexamethasone;Moist Heat;Traction;Ultrasound;Taping;Passive range of motion;Vestibular;Manual techniques    PT Next Visit Plan  review HEP, strength, balance     PT Home Exercise Plan  standing hip abduction, standing marches, modified tandem stance    Consulted and Agree with Plan of Care  Patient       Patient will benefit from skilled therapeutic intervention in order to improve the following deficits and impairments:  Abnormal gait, Decreased activity tolerance, Decreased balance, Decreased coordination, Decreased endurance, Decreased mobility, Decreased strength, Difficulty walking, Impaired sensation, Pain, Impaired UE functional use, Postural dysfunction, Improper body mechanics, Impaired perceived  functional ability, Decreased knowledge of use of DME  Visit Diagnosis: Unsteady gait  Muscle weakness (generalized)  Loss of balance  Impairment of balance     Problem List There are no active problems to display for this patient.  Janna Arch, PT, DPT   09/05/2018, 4:00 PM  [place] MAIN [facility] SERVICES [address] [place], [place], [address] Phone: [phone]   Fax:  [phone]  Name: Eric Torres MRN: [id number] Date of Birth: 12-19-1931

## 2018-09-11 ENCOUNTER — Ambulatory Visit: Payer: Medicare Other | Attending: Family Medicine

## 2018-09-11 DIAGNOSIS — R269 Unspecified abnormalities of gait and mobility: Secondary | ICD-10-CM | POA: Diagnosis present

## 2018-09-11 DIAGNOSIS — R2689 Other abnormalities of gait and mobility: Secondary | ICD-10-CM | POA: Insufficient documentation

## 2018-09-11 DIAGNOSIS — M6281 Muscle weakness (generalized): Secondary | ICD-10-CM | POA: Diagnosis present

## 2018-09-11 DIAGNOSIS — R2681 Unsteadiness on feet: Secondary | ICD-10-CM

## 2018-09-11 NOTE — Therapy (Signed)
[place] MAIN [facility] SERVICES [address] [place], [place], [address] Phone: [phone]   Fax:  [phone]  Physical Therapy Treatment  Patient Details  Name: Eric Torres MRN: [id number] Date of Birth: 1932-02-11 Referring Provider (PT): Mcneil Sober   Encounter Date: 09/11/2018  PT End of Session - 09/11/18 1611    Visit Number  12    Number of Visits  25    Date for PT Re-Evaluation  10/16/18    Authorization Type  4/10 (PN begins 08/21/2018)    PT Start Time  1346    PT Stop Time  1432    PT Time Calculation (min)  46 min    Equipment Utilized During Treatment  Gait belt    Activity Tolerance  Patient tolerated treatment well    Behavior During Therapy  WFL for tasks assessed/performed       Past Medical History:  Diagnosis Date  . BPH (benign prostatic hyperplasia)   . 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 COPD (chronic obstructive pulmonary disease) (HCC)    MILD-NO INHALERS PER PT  . Essential tremor   . Heart murmur   . History of brachytherapy   . History of TIA (transient ischemic attack) 2015   LEFT THUMB AND INDEX FINGER-SOMETIMES I DROP THINGS  . Hypercholesteremia   . Hypertension   . Hypothyroidism     Past Surgical History:  Procedure Laterality Date  . CHOLECYSTECTOMY    . CYSTOSCOPY WITH BIOPSY N/A 12/29/2015   Procedure: CYSTOSCOPY WITH BIOPSY;  Surgeon: Royston Cowper, MD;  Location: [facility] ORS;  Service: Urology;  Laterality: N/A;  . EXPLORATORY LAPAROTOMY  1991   VOLVULUS AND BOWEL OBSTRUCTION  . ILIAC ARTERY STENT Bilateral   . TONSILLECTOMY    . TRANSURETHRAL RESECTION OF BLADDER TUMOR N/A 12/29/2015   Procedure: TRANSURETHRAL RESECTION OF BLADDER TUMOR (TURBT)/MITOMYCIN INSTILLATION;  Surgeon: Royston Cowper, MD;  Location: [facility] ORS;  Service: Urology;  Laterality: N/A;  . TRANSURETHRAL RESECTION OF BLADDER TUMOR WITH GYRUS (TURBT-GYRUS)  2013  . VASECTOMY      There were no vitals filed for  this visit.  Subjective Assessment - 09/11/18 1854    Subjective  Patient reported that he has fallen twice since his last PT session. Reported the first fall happened while he was playing football (tossing the ball mainly) with a family member in the back yard, reached for the ball and fell forward. Reported using the steps to get back up. Pt also fell in the bathroom in the middle of the night, felt he just lost his balance, grabbed his safety bar, and was able to semi control his fall. Stated he had significant difficulty getting up from the bathroom floor, even with his wife's help. Pt reported that he did not hit his head either fall, and has no pain.    Pertinent History  Patient reports his main issue is his balance. States he has a new doctor that refered him to this clinic instead of outpatient in [facility] where he has received physical therapy prior. Has received therapy for balance in the past (approximately 1 year ago). States he has fallen twice recently when he tripped over leg lift of recliner (6 weeks ago), and fell carrying groceries while trying to open door (4 weeks ago). Reports he does have a fear of falling while walking. States he has circulation issues in his legs that has returned and has not told doctor. Completes  water aerobics 2-3x a week. Patient states he also has shoulder pain and back pain, and that shoulder hurts is when he raises arm in abduction against resistance and when rolling over in bed. Used to enjoy playing golf. Would like to be able to walk around the block with his wife.     Limitations  Standing;Walking;House hold activities    How long can you stand comfortably?  5 minutes    How long can you walk comfortably?  5 minutes     Patient Stated Goals  walk around block with wife, prevent falls,     Currently in Pain?  No/denies    Pain Onset  More than a month ago        Treatment: Functional activities: fall recovery education and demonstration CGA: use of  red mat and chair, CGAx2 to ensure safety, demonstration and verbal cues needed, pt had most difficulty transferring from tall kneeling to standing with use of chair.   Ambulation over/off uneven surface (foam mat) x6 rounds to facilitate foot clearance, CGA  Ambulation in hall x3 rounds of 222ft with verbal cues to attend to playing cards placed randomly on walls to encourage head turns, dual tasking during ambulation  Ambulation through agility ladder x4 with CGA due to several LOB, 2-3x pt needed PT to correct. Improved foot clearance with verbal cues.   Practice of transitional movements prone to quadruped, quadruped to sitting x5   Sit to stands without UE on uneven surface x10, CGA verbal cues to avoid UE support  Standing with FA on uneven surface with ball tosses to self  x15, CGA Standing with FT on uneven surface with ball tosses to self x15, CGA   Seated bilateral bicep curl x10 with GTB for UE strengthening to aid in transfer ability  Seated unilateral shoulder flexion x10 with GTB bilaterally for UE strengthening to aid in transfer ability    PT Education - 09/11/18 1856    Education provided  Yes    Education Details  fall recovery technique, transfers, HEP    Person(s) Educated  Patient    Methods  Explanation    Comprehension  Verbalized understanding;Returned demonstration;Verbal cues required;Tactile cues required;Need further instruction       PT Short Term Goals - 07/10/18 0807      PT SHORT TERM GOAL #1   Title  Patient will be independent with completion of HEP to improve ability to complete functional tasks.    Time  2    Period  Weeks    Status  New    Target Date  07/24/18      PT SHORT TERM GOAL #2   Title  Patient will report no falls in the next two weeks to demonstrate improved safety and fall risk.     Baseline  07/09/18: fell 2x in 6 weeks    Time  2    Period  Weeks    Status  New    Target Date  07/24/18        PT Long Term Goals -  07/10/18 0810      PT LONG TERM GOAL #1   Title  Patient will improve 5x sit to stand to <10seconds without UE support to demonstrate improvements in functional strength.     Baseline  07/09/18: 13sec hands on knees    Time  6    Period  Weeks    Status  New    Target Date  08/21/18  PT LONG TERM GOAL #2   Title  Patient will increase BERG balance score to >52/56 to decrease fall risk and improve balance.     Baseline  07/09/18: 47/56    Time  6    Period  Weeks    Status  New    Target Date  08/21/18      PT LONG TERM GOAL #3   Title  Patient will be able to walk with wife around neighborhood (>73minutes) to demonstrate improved balance and community ambulation.     Baseline  07/09/18: 67minutes    Time  6    Period  Weeks    Status  New    Target Date  08/21/18      PT LONG TERM GOAL #4   Title  Patient will improve 78mwt to >1.33m/s limiting fall risk and improve community ambulation.     Baseline  07/09/18: 0.827m/s    Time  6    Period  Weeks    Status  New    Target Date  08/21/18            Plan - 09/11/18 1857    Clinical Impression Statement  Pt and PT discussed and executed fall recovery techniques this session. Pt challeneged by transitional movements such as quadruped and tall kneeling. Pt given instructions to practice prone, to quadruped to tall kneeling at home to gain functional strength in these activities, as well as few UE strengthening exercises. Pt exhibited difficulty with foot clearance/placement during higher level balance/gait exercises. The patient would benefit from further skilled PT to continue to address deficits in balance/mobility/strength to maximize mobility, safety, and functional activities.     Rehab Potential  Good    Clinical Impairments Affecting Rehab Potential  (+) support system (-) age;     PT Frequency  2x / week    PT Duration  6 weeks    PT Treatment/Interventions  ADLs/Self Care Home Management;Energy  conservation;Patient/family education;Therapeutic exercise;Balance training;Neuromuscular re-education;Therapeutic activities;Functional mobility training;Stair training;DME Instruction;Gait training;Aquatic Therapy;Cryotherapy;Electrical Stimulation;Iontophoresis 4mg /ml Dexamethasone;Moist Heat;Traction;Ultrasound;Taping;Passive range of motion;Vestibular;Manual techniques    PT Next Visit Plan  review HEP, strength, balance     PT Home Exercise Plan  standing hip abduction, standing marches, modified tandem stance    Consulted and Agree with Plan of Care  Patient       Patient will benefit from skilled therapeutic intervention in order to improve the following deficits and impairments:  Abnormal gait, Decreased activity tolerance, Decreased balance, Decreased coordination, Decreased endurance, Decreased mobility, Decreased strength, Difficulty walking, Impaired sensation, Pain, Impaired UE functional use, Postural dysfunction, Improper body mechanics, Impaired perceived functional ability, Decreased knowledge of use of DME  Visit Diagnosis: Unsteady gait  Muscle weakness (generalized)  Loss of balance  Impairment of balance  Gait difficulty     Problem List There are no active problems to display for this patient.   Lieutenant Diego PT, DPT [phone] PM,09/11/18 [phone]  [place] MAIN [facility] SERVICES [address] [place], [place], [address] Phone: [phone]   Fax:  [phone]  Name: Eric Torres MRN: [id number] Date of Birth: [birth year]

## 2018-09-13 ENCOUNTER — Ambulatory Visit: Payer: Medicare Other | Admitting: Physical Therapy

## 2018-09-13 DIAGNOSIS — R269 Unspecified abnormalities of gait and mobility: Secondary | ICD-10-CM

## 2018-09-13 DIAGNOSIS — M6281 Muscle weakness (generalized): Secondary | ICD-10-CM

## 2018-09-13 DIAGNOSIS — R2689 Other abnormalities of gait and mobility: Secondary | ICD-10-CM

## 2018-09-13 DIAGNOSIS — R2681 Unsteadiness on feet: Secondary | ICD-10-CM | POA: Diagnosis not present

## 2018-09-14 NOTE — Therapy (Addendum)
[place] MAIN [facility] SERVICES [address] [place], [place], [address] Phone: [phone]   Fax:  [phone]  Physical Therapy Treatment  Patient Details  Name: Eric Torres MRN: [id number] Date of Birth: 05/09/1932 Referring Provider (PT): Mcneil Sober   Encounter Date: 09/13/2018  PT End of Session - 09/13/18 1446    Visit Number  13    Number of Visits  25    Date for PT Re-Evaluation  10/16/18    Authorization Type  5/10 (PN begins 08/21/2018)    PT Start Time  1440    PT Stop Time  1515    PT Time Calculation (min)  35 min    Equipment Utilized During Treatment  Gait belt    Activity Tolerance  Patient tolerated treatment well;Patient limited by fatigue    Behavior During Therapy  WFL for tasks assessed/performed       Past Medical History:  Diagnosis Date  . BPH (benign prostatic hyperplasia)   . 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 COPD (chronic obstructive pulmonary disease) (HCC)    MILD-NO INHALERS PER PT  . Essential tremor   . Heart murmur   . History of brachytherapy   . History of TIA (transient ischemic attack) 2015   LEFT THUMB AND INDEX FINGER-SOMETIMES I DROP THINGS  . Hypercholesteremia   . Hypertension   . Hypothyroidism     Past Surgical History:  Procedure Laterality Date  . CHOLECYSTECTOMY    . CYSTOSCOPY WITH BIOPSY N/A 12/29/2015   Procedure: CYSTOSCOPY WITH BIOPSY;  Surgeon: Royston Cowper, MD;  Location: [facility] ORS;  Service: Urology;  Laterality: N/A;  . EXPLORATORY LAPAROTOMY  1991   VOLVULUS AND BOWEL OBSTRUCTION  . ILIAC ARTERY STENT Bilateral   . TONSILLECTOMY    . TRANSURETHRAL RESECTION OF BLADDER TUMOR N/A 12/29/2015   Procedure: TRANSURETHRAL RESECTION OF BLADDER TUMOR (TURBT)/MITOMYCIN INSTILLATION;  Surgeon: Royston Cowper, MD;  Location: [facility] ORS;  Service: Urology;  Laterality: N/A;  . TRANSURETHRAL RESECTION OF BLADDER TUMOR WITH GYRUS (TURBT-GYRUS)  2013  . VASECTOMY       There were no vitals filed for this visit.  Subjective Assessment - 09/13/18 1440    Subjective  Patient reports that he got stuck in a leg press machine yesterday at the gym, but with the help of strangers was able to get out. Patient was frustrated after the experience because he felt increased dizziness and unsteadiness for the rest of the day. Patient feels steadier today than he has for the past two days.     Pertinent History  Patient reports his main issue is his balance. States he has a new doctor that refered him to this clinic instead of outpatient in [facility] where he has received physical therapy prior. Has received therapy for balance in the past (approximately 1 year ago). States he has fallen twice recently when he tripped over leg lift of recliner (6 weeks ago), and fell carrying groceries while trying to open door (4 weeks ago). Reports he does have a fear of falling while walking. States he has circulation issues in his legs that has returned and has not told doctor. Completes water aerobics 2-3x a week. Patient states he also has shoulder pain and back pain, and that shoulder hurts is when he raises arm in abduction against resistance and when rolling over in bed. Used to enjoy playing golf. Would like to be able to walk around the block  with his wife.     Limitations  Standing;Walking;House hold activities    How long can you stand comfortably?  5 minutes    How long can you walk comfortably?  5 minutes     Patient Stated Goals  walk around block with wife, prevent falls,     Currently in Pain?  No/denies    Pain Score  0-No pain    Pain Onset  More than a month ago        TREATMENT Patient expressed noticing the most loss of balance during nocturia episodes and would like to work on balance to aid with that.  Therapeutic Activity: Obstacle course including:  -supine to sit transfer  -sit to stand (x35 throughout session)  -ambulating through narrow spaces  -ambulating  from compliant to noncompliant surfaces  -ambulating with 180 degree turns  Patient had loss of balance 2x when navigating changes to floor surface and 180 degree turns.       PT Education - 09/13/18 1445    Education provided  Yes    Education Details  balance strategies, exercise technique    Person(s) Educated  Patient    Methods  Explanation;Demonstration;Verbal cues    Comprehension  Verbalized understanding;Returned demonstration;Need further instruction       PT Short Term Goals - 07/10/18 0807      PT SHORT TERM GOAL #1   Title  Patient will be independent with completion of HEP to improve ability to complete functional tasks.    Time  2    Period  Weeks    Status  New    Target Date  07/24/18      PT SHORT TERM GOAL #2   Title  Patient will report no falls in the next two weeks to demonstrate improved safety and fall risk.     Baseline  07/09/18: fell 2x in 6 weeks    Time  2    Period  Weeks    Status  New    Target Date  07/24/18        PT Long Term Goals - 07/10/18 0810      PT LONG TERM GOAL #1   Title  Patient will improve 5x sit to stand to <10seconds without UE support to demonstrate improvements in functional strength.     Baseline  07/09/18: 13sec hands on knees    Time  6    Period  Weeks    Status  New    Target Date  08/21/18      PT LONG TERM GOAL #2   Title  Patient will increase BERG balance score to >52/56 to decrease fall risk and improve balance.     Baseline  07/09/18: 47/56    Time  6    Period  Weeks    Status  New    Target Date  08/21/18      PT LONG TERM GOAL #3   Title  Patient will be able to walk with wife around neighborhood (>46minutes) to demonstrate improved balance and community ambulation.     Baseline  07/09/18: 39minutes    Time  6    Period  Weeks    Status  New    Target Date  08/21/18      PT LONG TERM GOAL #4   Title  Patient will improve 34mwt to >1.23m/s limiting fall risk and improve community ambulation.      Baseline  07/09/18: 0.849m/s    Time  6    Period  Weeks    Status  New    Target Date  08/21/18            Plan - 09/13/18 1008    Clinical Impression Statement  Patient presents with continued complaints of unsteadiness and balance impairments, but no new reports of falls. Patient was quite sociable today and participated in all therapeutic activity with excellent motivation, but slightly limited focus. Patient demonstrated good safety techniques when performing transfers supine<>sit<>stand, and will benefit from more safety practice during ambulation. Patient will also benefit from balance approaches where vision can be limited and is interested in using all balance equipment available at the hospital rehab since he is driving from [facility]. Patient will continue to benefit from skilled therapy to address falls risk and improve overall QOL.   Rehab Potential  Good    Clinical Impairments Affecting Rehab Potential  (+) support system (-) age;     PT Frequency  2x / week    PT Duration  6 weeks    PT Treatment/Interventions  ADLs/Self Care Home Management;Energy conservation;Patient/family education;Therapeutic exercise;Balance training;Neuromuscular re-education;Therapeutic activities;Functional mobility training;Stair training;DME Instruction;Gait training;Aquatic Therapy;Cryotherapy;Electrical Stimulation;Iontophoresis 4mg /ml Dexamethasone;Moist Heat;Traction;Ultrasound;Taping;Passive range of motion;Vestibular;Manual techniques    PT Next Visit Plan  review HEP, strength, balance     PT Home Exercise Plan  standing hip abduction, standing marches, modified tandem stance    Consulted and Agree with Plan of Care  Patient       Patient will benefit from skilled therapeutic intervention in order to improve the following deficits and impairments:  Abnormal gait, Decreased activity tolerance, Decreased balance, Decreased coordination, Decreased endurance, Decreased mobility, Decreased  strength, Difficulty walking, Impaired sensation, Pain, Impaired UE functional use, Postural dysfunction, Improper body mechanics, Impaired perceived functional ability, Decreased knowledge of use of DME  Visit Diagnosis: Unsteady gait  Muscle weakness (generalized)  Loss of balance  Impairment of balance  Gait difficulty     Problem List There are no active problems to display for this patient.  Myles Gip PT, DPT [id number] 09/14/2018, 10:11 AM  [place] MAIN [facility] SERVICES [address] [place], [place], [address] Phone: [phone]   Fax:  [phone]  Name: Eric Torres MRN: [id number] Date of Birth: [birth year]

## 2018-09-20 ENCOUNTER — Ambulatory Visit: Payer: Medicare Other

## 2018-09-25 ENCOUNTER — Ambulatory Visit: Payer: Medicare Other

## 2018-09-27 ENCOUNTER — Ambulatory Visit: Payer: Medicare Other

## 2018-10-04 ENCOUNTER — Ambulatory Visit: Payer: Medicare Other

## 2018-10-04 DIAGNOSIS — M6281 Muscle weakness (generalized): Secondary | ICD-10-CM

## 2018-10-04 DIAGNOSIS — R2681 Unsteadiness on feet: Secondary | ICD-10-CM | POA: Diagnosis not present

## 2018-10-04 DIAGNOSIS — R2689 Other abnormalities of gait and mobility: Secondary | ICD-10-CM

## 2018-10-04 DIAGNOSIS — R269 Unspecified abnormalities of gait and mobility: Secondary | ICD-10-CM

## 2018-10-04 NOTE — Therapy (Signed)
[place] MAIN [facility] SERVICES [address] [place], [place], [address] Phone: [phone]   Fax:  [phone]  Physical Therapy Treatment  Patient Details  Name: Eric Torres MRN: [id number] Date of Birth: [birth year] Referring Provider (PT): Mcneil Sober   Encounter Date: 10/04/2018  PT End of Session - 10/04/18 1107    Visit Number  14    Number of Visits  25    Date for PT Re-Evaluation  10/16/18    Authorization Type  6/10 (PN begins 08/21/2018)    PT Start Time  1115    PT Stop Time  1200    PT Time Calculation (min)  45 min    Equipment Utilized During Treatment  Gait belt    Activity Tolerance  Patient tolerated treatment well;Patient limited by fatigue    Behavior During Therapy  [facility] for tasks assessed/performed       Past Medical History:  Diagnosis Date  . BPH (benign prostatic hyperplasia)   . 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 COPD (chronic obstructive pulmonary disease) (HCC)    MILD-NO INHALERS PER PT  . Essential tremor   . Heart murmur   . History of brachytherapy   . History of TIA (transient ischemic attack) 2015   LEFT THUMB AND INDEX FINGER-SOMETIMES I DROP THINGS  . Hypercholesteremia   . Hypertension   . Hypothyroidism     Past Surgical History:  Procedure Laterality Date  . CHOLECYSTECTOMY    . CYSTOSCOPY WITH BIOPSY N/A 12/29/2015   Procedure: CYSTOSCOPY WITH BIOPSY;  Surgeon: Royston Cowper, MD;  Location: [facility] ORS;  Service: Urology;  Laterality: N/A;  . EXPLORATORY LAPAROTOMY  1991   VOLVULUS AND BOWEL OBSTRUCTION  . ILIAC ARTERY STENT Bilateral   . TONSILLECTOMY    . TRANSURETHRAL RESECTION OF BLADDER TUMOR N/A 12/29/2015   Procedure: TRANSURETHRAL RESECTION OF BLADDER TUMOR (TURBT)/MITOMYCIN INSTILLATION;  Surgeon: Royston Cowper, MD;  Location: [facility] ORS;  Service: Urology;  Laterality: N/A;  . TRANSURETHRAL RESECTION OF BLADDER TUMOR WITH GYRUS (TURBT-GYRUS)  2013  . VASECTOMY       There were no vitals filed for this visit.  Subjective Assessment - 10/04/18 1117    Subjective  Patient returned from [facility] for his sister in law who is in poor health. Had to spend Christmas alone as wife is still with her sister. Reports no falls.     Pertinent History  Patient reports his main issue is his balance. States he has a new doctor that refered him to this clinic instead of outpatient in [facility] where he has received physical therapy prior. Has received therapy for balance in the past (approximately 1 year ago). States he has fallen twice recently when he tripped over leg lift of recliner (6 weeks ago), and fell carrying groceries while trying to open door (4 weeks ago). Reports he does have a fear of falling while walking. States he has circulation issues in his legs that has returned and has not told doctor. Completes water aerobics 2-3x a week. Patient states he also has shoulder pain and back pain, and that shoulder hurts is when he raises arm in abduction against resistance and when rolling over in bed. Used to enjoy playing golf. Would like to be able to walk around the block with his wife.     Limitations  Standing;Walking;House hold activities    How long can you stand comfortably?  5 minutes  How long can you walk comfortably?  5 minutes     Patient Stated Goals  walk around block with wife, prevent falls,     Currently in Pain?  No/denies       Standing balloon taps on airexpad reaching in/out of BOS x2 min    Basketball shoot standing on blue foam with modified tandem stance in parallel bars, CGA x2 minutes   Standing step overs orange hurdle x10 with each LE. Verbal cues for lifting knee to ceiling for foot clearance. No UE support. CGA. 1 LOB   Eccentric step down front of 4 in. step x10 each LE. BUE faded to 1UE support. CGA. Verbal cues to tap heel.    Airex pad: toe taps 4" step 10x each LE, no UE support.   Airex pad: eyes closed 3x 30 seconds.    Airex pad: lateral toe taps  4" step 12x each LE, no UE support    Half airex pad: df, pf rocking  x2 minutes SUE support   Seated hamstring stretch 2x 30 seconds each LE.   Walking in hallway with lateral head turns identifying playing cards on wall  2x86 ft, CGA              Lateral ball toss, 2x 86 ft, CGA             Bouncing ball to self, 2x 86 ft, CGA experiencing frequent LOB's self corrected and with assistance from PT             Rotation passes to PT 2x 86 ft     Half foam roller stability in // bars 2x 60 seconds, excessive trunk flexion resulting in LOB.                       PT Education - 10/04/18 1105    Education provided  Yes    Education Details  balance strategies, exercise technique     Person(s) Educated  Patient    Methods  Explanation;Demonstration;Tactile cues;Verbal cues    Comprehension  Verbalized understanding;Returned demonstration;Verbal cues required;Tactile cues required       PT Short Term Goals - 07/10/18 0807      PT SHORT TERM GOAL #1   Title  Patient will be independent with completion of HEP to improve ability to complete functional tasks.    Time  2    Period  Weeks    Status  New    Target Date  07/24/18      PT SHORT TERM GOAL #2   Title  Patient will report no falls in the next two weeks to demonstrate improved safety and fall risk.     Baseline  07/09/18: fell 2x in 6 weeks    Time  2    Period  Weeks    Status  New    Target Date  07/24/18        PT Long Term Goals - 07/10/18 0810      PT LONG TERM GOAL #1   Title  Patient will improve 5x sit to stand to <10seconds without UE support to demonstrate improvements in functional strength.     Baseline  07/09/18: 13sec hands on knees    Time  6    Period  Weeks    Status  New    Target Date  08/21/18      PT LONG TERM GOAL #2   Title  Patient will increase BERG balance  score to >52/56 to decrease fall risk and improve balance.     Baseline  07/09/18:  47/56    Time  6    Period  Weeks    Status  New    Target Date  08/21/18      PT LONG TERM GOAL #3   Title  Patient will be able to walk with wife around neighborhood (>89minutes) to demonstrate improved balance and community ambulation.     Baseline  07/09/18: 68minutes    Time  6    Period  Weeks    Status  New    Target Date  08/21/18      PT LONG TERM GOAL #4   Title  Patient will improve 80mwt to >1.38m/s limiting fall risk and improve community ambulation.     Baseline  07/09/18: 0.888m/s    Time  6    Period  Weeks    Status  New    Target Date  08/21/18            Plan - 10/04/18 1252    Clinical Impression Statement  Patient demonstrated poor awareness of body in space for foot placement when performing reciprocating motion with higher intensity velocity. Patient will also benefit from balance approaches where vision can be limited and is interested in using all balance equipment available at the hospital rehab since he is driving from [facility]. Patient will continue to benefit from skilled therapy to address falls risk and improve overall QOL.    Rehab Potential  Good    Clinical Impairments Affecting Rehab Potential  (+) support system (-) age;     PT Frequency  2x / week    PT Duration  6 weeks    PT Treatment/Interventions  ADLs/Self Care Home Management;Energy conservation;Patient/family education;Therapeutic exercise;Balance training;Neuromuscular re-education;Therapeutic activities;Functional mobility training;Stair training;DME Instruction;Gait training;Aquatic Therapy;Cryotherapy;Electrical Stimulation;Iontophoresis 4mg /ml Dexamethasone;Moist Heat;Traction;Ultrasound;Taping;Passive range of motion;Vestibular;Manual techniques    PT Next Visit Plan  review HEP, strength, balance     PT Home Exercise Plan  standing hip abduction, standing marches, modified tandem stance    Consulted and Agree with Plan of Care  Patient       Patient will benefit from skilled  therapeutic intervention in order to improve the following deficits and impairments:  Abnormal gait, Decreased activity tolerance, Decreased balance, Decreased coordination, Decreased endurance, Decreased mobility, Decreased strength, Difficulty walking, Impaired sensation, Pain, Impaired UE functional use, Postural dysfunction, Improper body mechanics, Impaired perceived functional ability, Decreased knowledge of use of DME  Visit Diagnosis: Unsteady gait  Muscle weakness (generalized)  Loss of balance  Impairment of balance  Gait difficulty     Problem List There are no active problems to display for this patient.  Janna Arch, PT, DPT   10/04/2018, 12:53 PM  [place] MAIN [facility] SERVICES [address] [place], [place], [address] Phone: [phone]   Fax:  [phone]  Name: Eric Torres MRN: [id number] Date of Birth: 05-26-1932

## 2018-10-11 ENCOUNTER — Encounter: Payer: Self-pay | Admitting: Physical Therapy

## 2018-10-11 ENCOUNTER — Ambulatory Visit: Payer: Medicare Other | Attending: Family Medicine | Admitting: Physical Therapy

## 2018-10-11 DIAGNOSIS — M25551 Pain in right hip: Secondary | ICD-10-CM | POA: Insufficient documentation

## 2018-10-11 DIAGNOSIS — M25552 Pain in left hip: Secondary | ICD-10-CM | POA: Insufficient documentation

## 2018-10-11 DIAGNOSIS — R2681 Unsteadiness on feet: Secondary | ICD-10-CM | POA: Insufficient documentation

## 2018-10-11 DIAGNOSIS — M6281 Muscle weakness (generalized): Secondary | ICD-10-CM

## 2018-10-11 DIAGNOSIS — R2689 Other abnormalities of gait and mobility: Secondary | ICD-10-CM | POA: Diagnosis present

## 2018-10-11 DIAGNOSIS — R269 Unspecified abnormalities of gait and mobility: Secondary | ICD-10-CM | POA: Diagnosis present

## 2018-10-11 NOTE — Therapy (Signed)
[place] MAIN [facility] SERVICES [address] [place], [place], [address] Phone: [phone]   Fax:  [phone]  Physical Therapy Treatment  Patient Details  Name: Eric Torres MRN: [id number] Date of Birth: Sep 26, 1932 Referring Provider (PT): Mcneil Sober   Encounter Date: 10/11/2018  PT End of Session - 10/11/18 1123    Visit Number  15    Number of Visits  25    Date for PT Re-Evaluation  10/16/18    Authorization Type  7/10 (PN begins 08/21/2018)    PT Start Time  1115    PT Stop Time  1200    PT Time Calculation (min)  45 min    Equipment Utilized During Treatment  Gait belt    Activity Tolerance  Patient tolerated treatment well;Patient limited by fatigue    Behavior During Therapy  [facility] for tasks assessed/performed       Past Medical History:  Diagnosis Date  . BPH (benign prostatic hyperplasia)   . 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 COPD (chronic obstructive pulmonary disease) (HCC)    MILD-NO INHALERS PER PT  . Essential tremor   . Heart murmur   . History of brachytherapy   . History of TIA (transient ischemic attack) 2015   LEFT THUMB AND INDEX FINGER-SOMETIMES I DROP THINGS  . Hypercholesteremia   . Hypertension   . Hypothyroidism     Past Surgical History:  Procedure Laterality Date  . CHOLECYSTECTOMY    . CYSTOSCOPY WITH BIOPSY N/A 12/29/2015   Procedure: CYSTOSCOPY WITH BIOPSY;  Surgeon: Royston Cowper, MD;  Location: [facility] ORS;  Service: Urology;  Laterality: N/A;  . EXPLORATORY LAPAROTOMY  1991   VOLVULUS AND BOWEL OBSTRUCTION  . ILIAC ARTERY STENT Bilateral   . TONSILLECTOMY    . TRANSURETHRAL RESECTION OF BLADDER TUMOR N/A 12/29/2015   Procedure: TRANSURETHRAL RESECTION OF BLADDER TUMOR (TURBT)/MITOMYCIN INSTILLATION;  Surgeon: Royston Cowper, MD;  Location: [facility] ORS;  Service: Urology;  Laterality: N/A;  . TRANSURETHRAL RESECTION OF BLADDER TUMOR WITH GYRUS (TURBT-GYRUS)  2013  . VASECTOMY       There were no vitals filed for this visit.  Subjective Assessment - 10/11/18 1117    Subjective  Patient states that he is okay today. Patient reports that his wife is coming home Monday from [place]. Patient reports no falls in the past week.     Pertinent History  Patient reports his main issue is his balance. States he has a new doctor that refered him to this clinic instead of outpatient in [facility] where he has received physical therapy prior. Has received therapy for balance in the past (approximately 1 year ago). States he has fallen twice recently when he tripped over leg lift of recliner (6 weeks ago), and fell carrying groceries while trying to open door (4 weeks ago). Reports he does have a fear of falling while walking. States he has circulation issues in his legs that has returned and has not told doctor. Completes water aerobics 2-3x a week. Patient states he also has shoulder pain and back pain, and that shoulder hurts is when he raises arm in abduction against resistance and when rolling over in bed. Used to enjoy playing golf. Would like to be able to walk around the block with his wife.     Limitations  Standing;Walking;House hold activities    How long can you stand comfortably?  5 minutes    How long can you  walk comfortably?  5 minutes     Patient Stated Goals  walk around block with wife, prevent falls,     Currently in Pain?  No/denies       TREATMENT NuStep, L3, x5 min during hx, for warm-up, unbilled  Neuromuscular Re-education Airex modified tandem stance, EO, heel/toe, x20 each leg Airex modified tandem stance, EC, 3 x20 sec each Low rockerboard, A/P, M/L touches x20, balance x1 min each, with perturbations Rockerboard, A/P, M/L touches, x10, balance x 1 min each, with perturbations BOSU, flat side up, balance x2 min, with perturbations for 30 sec Sit to stand, x10, no UE  Patient educated on technique and balance strategies throughout session.   PT Short Term  Goals - 07/10/18 0807      PT SHORT TERM GOAL #1   Title  Patient will be independent with completion of HEP to improve ability to complete functional tasks.    Time  2    Period  Weeks    Status  New    Target Date  07/24/18      PT SHORT TERM GOAL #2   Title  Patient will report no falls in the next two weeks to demonstrate improved safety and fall risk.     Baseline  07/09/18: fell 2x in 6 weeks    Time  2    Period  Weeks    Status  New    Target Date  07/24/18        PT Long Term Goals - 07/10/18 0810      PT LONG TERM GOAL #1   Title  Patient will improve 5x sit to stand to <10seconds without UE support to demonstrate improvements in functional strength.     Baseline  07/09/18: 13sec hands on knees    Time  6    Period  Weeks    Status  New    Target Date  08/21/18      PT LONG TERM GOAL #2   Title  Patient will increase BERG balance score to >52/56 to decrease fall risk and improve balance.     Baseline  07/09/18: 47/56    Time  6    Period  Weeks    Status  New    Target Date  08/21/18      PT LONG TERM GOAL #3   Title  Patient will be able to walk with wife around neighborhood (>73minutes) to demonstrate improved balance and community ambulation.     Baseline  07/09/18: 53minutes    Time  6    Period  Weeks    Status  New    Target Date  08/21/18      PT LONG TERM GOAL #4   Title  Patient will improve 66mwt to >1.51m/s limiting fall risk and improve community ambulation.     Baseline  07/09/18: 0.874m/s    Time  6    Period  Weeks    Status  New    Target Date  08/21/18            Plan - 10/11/18 1248    Clinical Impression Statement  Patient participated in progressed balance exercises today with challenges to the vestibular and visual systems. Patient continues to demonstrate an unawareness of body position when on an unstable surface as well as decreased tolerance to activity. Patient is highly interested in using all balance equipment available at  the hospital rehab since he is driving from [facility]. Patient will  continue to benefit from skilled therapeutic interventions to address falls risk and improve overall QOL.     Rehab Potential  Good    Clinical Impairments Affecting Rehab Potential  (+) support system (-) age;     PT Frequency  2x / week    PT Duration  6 weeks    PT Treatment/Interventions  ADLs/Self Care Home Management;Energy conservation;Patient/family education;Therapeutic exercise;Balance training;Neuromuscular re-education;Therapeutic activities;Functional mobility training;Stair training;DME Instruction;Gait training;Aquatic Therapy;Cryotherapy;Electrical Stimulation;Iontophoresis 4mg /ml Dexamethasone;Moist Heat;Traction;Ultrasound;Taping;Passive range of motion;Vestibular;Manual techniques    PT Next Visit Plan  review HEP, strength, balance     PT Home Exercise Plan  standing hip abduction, standing marches, modified tandem stance    Consulted and Agree with Plan of Care  Patient       Patient will benefit from skilled therapeutic intervention in order to improve the following deficits and impairments:  Abnormal gait, Decreased activity tolerance, Decreased balance, Decreased coordination, Decreased endurance, Decreased mobility, Decreased strength, Difficulty walking, Impaired sensation, Pain, Impaired UE functional use, Postural dysfunction, Improper body mechanics, Impaired perceived functional ability, Decreased knowledge of use of DME  Visit Diagnosis: Unsteady gait  Muscle weakness (generalized)  Loss of balance  Impairment of balance  Gait difficulty  Pain in left hip  Pain in right hip     Problem List There are no active problems to display for this patient.  Myles Gip PT, DPT [phone] 10/11/2018, 12:57 PM  [place] MAIN [facility] SERVICES [address] [place], [place], [address] Phone: [phone]   Fax:  [phone]  Name: SHELLEY POOLEY MRN:  [id number] Date of Birth: 07/19/1932

## 2018-10-16 ENCOUNTER — Ambulatory Visit: Payer: Medicare Other

## 2018-10-16 DIAGNOSIS — R2681 Unsteadiness on feet: Secondary | ICD-10-CM | POA: Diagnosis not present

## 2018-10-16 DIAGNOSIS — M6281 Muscle weakness (generalized): Secondary | ICD-10-CM

## 2018-10-16 DIAGNOSIS — R2689 Other abnormalities of gait and mobility: Secondary | ICD-10-CM

## 2018-10-16 NOTE — Therapy (Signed)
[place] MAIN [facility] SERVICES [address] [place], [place], [address] Phone: [phone]   Fax:  [phone]  Physical Therapy Treatment/ RECERT Physical Therapy Progress Note   Dates of reporting period  08/21/18   to   10/16/18  Patient Details  Name: Eric Torres MRN: [id number] Date of Birth: 11-14-1931 Referring Provider (PT): Mcneil Sober   Encounter Date: 10/16/2018  PT End of Session - 10/16/18 1706    Visit Number  16    Number of Visits  28    Date for PT Re-Evaluation  11/27/18    Authorization Type  8/10 (PN begins 08/21/2018) (next visit 1/10 PN start 10/16/18 )     PT Start Time  1530    PT Stop Time  1614    PT Time Calculation (min)  44 min    Equipment Utilized During Treatment  Gait belt    Activity Tolerance  Patient tolerated treatment well;Patient limited by fatigue    Behavior During Therapy  WFL for tasks assessed/performed       Past Medical History:  Diagnosis Date  . BPH (benign prostatic hyperplasia)   . 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 COPD (chronic obstructive pulmonary disease) (HCC)    MILD-NO INHALERS PER PT  . Essential tremor   . Heart murmur   . History of brachytherapy   . History of TIA (transient ischemic attack) 2015   LEFT THUMB AND INDEX FINGER-SOMETIMES I DROP THINGS  . Hypercholesteremia   . Hypertension   . Hypothyroidism     Past Surgical History:  Procedure Laterality Date  . CHOLECYSTECTOMY    . CYSTOSCOPY WITH BIOPSY N/A 12/29/2015   Procedure: CYSTOSCOPY WITH BIOPSY;  Surgeon: Royston Cowper, MD;  Location: [facility] ORS;  Service: Urology;  Laterality: N/A;  . EXPLORATORY LAPAROTOMY  1991   VOLVULUS AND BOWEL OBSTRUCTION  . ILIAC ARTERY STENT Bilateral   . TONSILLECTOMY    . TRANSURETHRAL RESECTION OF BLADDER TUMOR N/A 12/29/2015   Procedure: TRANSURETHRAL RESECTION OF BLADDER TUMOR (TURBT)/MITOMYCIN INSTILLATION;  Surgeon: Royston Cowper, MD;  Location: [facility] ORS;  Service:  Urology;  Laterality: N/A;  . TRANSURETHRAL RESECTION OF BLADDER TUMOR WITH GYRUS (TURBT-GYRUS)  2013  . VASECTOMY      There were no vitals filed for this visit.  Subjective Assessment - 10/16/18 1703    Subjective  Patient states his wife got home last night from [place]. His sister in law is now in hospice care. Patient reports he feels unsteady occasionally and gets "wobbly" randomly such as when he was doing dishes this morning.     Pertinent History  Patient reports his main issue is his balance. States he has a new doctor that refered him to this clinic instead of outpatient in [facility] where he has received physical therapy prior. Has received therapy for balance in the past (approximately 1 year ago). States he has fallen twice recently when he tripped over leg lift of recliner (6 weeks ago), and fell carrying groceries while trying to open door (4 weeks ago). Reports he does have a fear of falling while walking. States he has circulation issues in his legs that has returned and has not told doctor. Completes water aerobics 2-3x a week. Patient states he also has shoulder pain and back pain, and that shoulder hurts is when he raises arm in abduction against resistance and when rolling over in bed. Used to enjoy playing golf. Would like  to be able to walk around the block with his wife.     Limitations  Standing;Walking;House hold activities    How long can you stand comfortably?  5 minutes    How long can you walk comfortably?  5 minutes     Patient Stated Goals  walk around block with wife, prevent falls,     Currently in Pain?  No/denies      RECERT Goals:  No falls in past two weeks: no falls  5x STS without UE support: 13 seconds with intermittent hands on knees BERG: 52/ 56  Walk with wife around neighborhood (~15 minutes) 10 MWT: 8 seconds = 1.25 m/s   DGI: 14/ 24    Neuromuscular Re-education airex balance beam: tandem walk 6x length of bars, CGA with finger tip support; side  step 6x length of bars with finger tip support and CGA Half foam roller: df and pf rocking with flat side up, finger tip support 2 minutes 6" step eccentric step down: 10x each LE, BUE support.   Stair negotiation: ascending: no UE support required reciprocating motion, descending Single to BUE support required, scissoring motion with excessive trunk lean posteriorly.  Patient educated on technique and balance strategies throughout session.  Patient's condition has the potential to improve in response to therapy. Maximum improvement is yet to be obtained. The anticipated improvement is attainable and reasonable in a generally predictable time.  Patient reports that he has moments of instability at home resulting in near falls where he has to catch himself.      [facility] PT Assessment - 10/16/18 0001      Standardized Balance Assessment   Standardized Balance Assessment  Berg Balance Test;Dynamic Gait Index      Berg Balance Test   Sit to Stand  Able to stand without using hands and stabilize independently    Standing Unsupported  Able to stand safely 2 minutes    Sitting with Back Unsupported but Feet Supported on Floor or Stool  Able to sit safely and securely 2 minutes    Stand to Sit  Sits safely with minimal use of hands    Transfers  Able to transfer safely, minor use of hands    Standing Unsupported with Eyes Closed  Able to stand 10 seconds safely    Standing Ubsupported with Feet Together  Able to place feet together independently and stand 1 minute safely    From Standing, Reach Forward with Outstretched Arm  Can reach confidently >25 cm (10")    From Standing Position, Pick up Object from Floor  Able to pick up shoe safely and easily    From Standing Position, Turn to Look Behind Over each Shoulder  Looks behind one side only/other side shows less weight shift    Turn 360 Degrees  Able to turn 360 degrees safely in 4 seconds or less    Standing Unsupported, Alternately Place Feet  on Step/Stool  Able to stand independently and complete 8 steps >20 seconds    Standing Unsupported, One Foot in Front  Able to plae foot ahead of the other independently and hold 30 seconds    Standing on One Leg  Able to lift leg independently and hold 5-10 seconds    Total Score  52      Dynamic Gait Index   Level Surface  Mild Impairment    Change in Gait Speed  Mild Impairment    Gait with Horizontal Head Turns  Mild Impairment  Gait with Vertical Head Turns  Moderate Impairment    Gait and Pivot Turn  Mild Impairment    Step Over Obstacle  Mild Impairment    Step Around Obstacles  Moderate Impairment    Steps  Mild Impairment    Total Score  14                           PT Education - 10/16/18 1704    Education provided  Yes    Education Details  goals, POC, balance strategies, stair negotiation     Person(s) Educated  Patient    Methods  Explanation;Demonstration;Tactile cues;Verbal cues    Comprehension  Verbalized understanding;Returned demonstration;Verbal cues required;Tactile cues required;Need further instruction       PT Short Term Goals - 10/16/18 1713      PT SHORT TERM GOAL #1   Title  Patient will be independent with completion of HEP to improve ability to complete functional tasks.    Baseline  HEP compliant    Time  2    Period  Weeks    Status  Achieved      PT SHORT TERM GOAL #2   Title  Patient will report no falls in the next two weeks to demonstrate improved safety and fall risk.     Baseline  07/09/18: fell 2x in 6 weeks 1/7: no falls    Time  2    Period  Weeks    Status  Achieved        PT Long Term Goals - 10/16/18 1550      PT LONG TERM GOAL #1   Title  Patient will improve 5x sit to stand to <10seconds without UE support to demonstrate improvements in functional strength.     Baseline  07/09/18: 13sec hands on knees 1/7: 13 seconds occasional hand on knee    Time  6    Period  Weeks    Status  Partially Met     Target Date  11/27/18      PT LONG TERM GOAL #2   Title  Patient will increase BERG balance score to >52/56 to decrease fall risk and improve balance.     Baseline  07/09/18: 47/56 1/7: 52/56     Time  6    Period  Weeks    Status  Achieved      PT LONG TERM GOAL #3   Title  Patient will be able to walk with wife around neighborhood (>43mnutes) to demonstrate improved balance and community ambulation.     Baseline  07/09/18: 571mutes 1/7: wife just returned     Time  6    Period  Weeks    Status  On-going    Target Date  11/27/18      PT LONG TERM GOAL #4   Title  Patient will improve 1046mto >1.56m/52mimiting fall risk and improve community ambulation.     Baseline  07/09/18: 0.833m/69m/7: 1.25 m/s     Time  6    Period  Weeks    Status  Achieved      PT LONG TERM GOAL #5   Title  Patient will increase DGI to >18/24 to indicate increased stability with dynamic mobility and decrease fall risk.     Baseline  1/7: 14/24     Time  6    Period  Weeks    Status  New    Target Date  11/27/18      PT LONG TERM GOAL #6   Title  Pt able to descend 8 steps with single UE assist with no LOB or heel catching on step in order to improve community access and safety on steps.    Baseline  1/7: BUE support and heel catching    Time  6    Period  Weeks    Status  New    Target Date  11/27/18            Plan - 10/16/18 1710    Clinical Impression Statement  Patient demonstrates excellent progression/meeting of goals allowing for progression from static balance assessment to dynamic balance assessment. Patient scored a 52/56 on the BERG however scored 14/24 on the DGI indicating a lack of dynamic stability with an increased fall risk with mobility.   Patient's condition has the potential to improve in response to therapy. Maximum improvement is yet to be obtained. The anticipated improvement is attainable and reasonable in a generally predictable time. Patient will also benefit from  balance approaches where vision can be limited and is interested in using all balance equipment available at the hospital rehab since he is driving from [facility]. Patient will continue to benefit from skilled therapy to address falls risk and improve overall QOL.    Rehab Potential  Good    Clinical Impairments Affecting Rehab Potential  (+) support system (-) age;     PT Frequency  2x / week    PT Duration  6 weeks    PT Treatment/Interventions  ADLs/Self Care Home Management;Energy conservation;Patient/family education;Therapeutic exercise;Balance training;Neuromuscular re-education;Therapeutic activities;Functional mobility training;Stair training;DME Instruction;Gait training;Aquatic Therapy;Cryotherapy;Electrical Stimulation;Iontophoresis 33m/ml Dexamethasone;Moist Heat;Traction;Ultrasound;Taping;Passive range of motion;Vestibular;Manual techniques    PT Next Visit Plan  review HEP, strength, balance     PT Home Exercise Plan  standing hip abduction, standing marches, modified tandem stance    Consulted and Agree with Plan of Care  Patient       Patient will benefit from skilled therapeutic intervention in order to improve the following deficits and impairments:  Abnormal gait, Decreased activity tolerance, Decreased balance, Decreased coordination, Decreased endurance, Decreased mobility, Decreased strength, Difficulty walking, Impaired sensation, Pain, Impaired UE functional use, Postural dysfunction, Improper body mechanics, Impaired perceived functional ability, Decreased knowledge of use of DME  Visit Diagnosis: Unsteady gait  Muscle weakness (generalized)  Loss of balance     Problem List There are no active problems to display for this patient.  MJanna Arch PT, DPT   10/16/2018, 5:14 PM  [place] [facility] [address] [address]Phone: [phone]  Fax:  [phone] Name: Eric LEAMRN:  [id number] of Birth: 7Aug 03, 1933

## 2018-10-18 ENCOUNTER — Encounter: Payer: Self-pay | Admitting: Physical Therapy

## 2018-10-18 ENCOUNTER — Ambulatory Visit: Payer: Medicare Other | Admitting: Physical Therapy

## 2018-10-18 DIAGNOSIS — R2681 Unsteadiness on feet: Secondary | ICD-10-CM

## 2018-10-18 DIAGNOSIS — R269 Unspecified abnormalities of gait and mobility: Secondary | ICD-10-CM

## 2018-10-18 DIAGNOSIS — R2689 Other abnormalities of gait and mobility: Secondary | ICD-10-CM

## 2018-10-18 DIAGNOSIS — M6281 Muscle weakness (generalized): Secondary | ICD-10-CM

## 2018-10-18 NOTE — Therapy (Signed)
[place] MAIN [facility] SERVICES [address] [place], [place], [address] Phone: [phone]   Fax:  [phone]  Physical Therapy Treatment  Patient Details  Name: Eric Torres MRN: [id number] Date of Birth: 05/07/1932 Referring Provider (PT): Mcneil Sober   Encounter Date: 10/18/2018  PT End of Session - 10/18/18 1434    Visit Number  17    Number of Visits  28    Date for PT Re-Evaluation  11/27/18    Authorization Type  8/10 (PN begins 08/21/2018) (next visit 1/10 PN start 10/16/18 )     PT Start Time  0230    PT Stop Time  0310    PT Time Calculation (min)  40 min    Equipment Utilized During Treatment  Gait belt    Activity Tolerance  Patient tolerated treatment well;Patient limited by fatigue    Behavior During Therapy  WFL for tasks assessed/performed       Past Medical History:  Diagnosis Date  . BPH (benign prostatic hyperplasia)   . 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 COPD (chronic obstructive pulmonary disease) (HCC)    MILD-NO INHALERS PER PT  . Essential tremor   . Heart murmur   . History of brachytherapy   . History of TIA (transient ischemic attack) 2015   LEFT THUMB AND INDEX FINGER-SOMETIMES I DROP THINGS  . Hypercholesteremia   . Hypertension   . Hypothyroidism     Past Surgical History:  Procedure Laterality Date  . CHOLECYSTECTOMY    . CYSTOSCOPY WITH BIOPSY N/A 12/29/2015   Procedure: CYSTOSCOPY WITH BIOPSY;  Surgeon: Royston Cowper, MD;  Location: [facility] ORS;  Service: Urology;  Laterality: N/A;  . EXPLORATORY LAPAROTOMY  1991   VOLVULUS AND BOWEL OBSTRUCTION  . ILIAC ARTERY STENT Bilateral   . TONSILLECTOMY    . TRANSURETHRAL RESECTION OF BLADDER TUMOR N/A 12/29/2015   Procedure: TRANSURETHRAL RESECTION OF BLADDER TUMOR (TURBT)/MITOMYCIN INSTILLATION;  Surgeon: Royston Cowper, MD;  Location: [facility] ORS;  Service: Urology;  Laterality: N/A;  . TRANSURETHRAL RESECTION OF BLADDER TUMOR WITH GYRUS  (TURBT-GYRUS)  2013  . VASECTOMY      There were no vitals filed for this visit.  Subjective Assessment - 10/18/18 1433    Subjective   Patient reports that he was trying to pick up his credit card and almost fell forward.     Pertinent History  Patient reports his main issue is his balance. States he has a new doctor that refered him to this clinic instead of outpatient in [facility] where he has received physical therapy prior. Has received therapy for balance in the past (approximately 1 year ago). States he has fallen twice recently when he tripped over leg lift of recliner (6 weeks ago), and fell carrying groceries while trying to open door (4 weeks ago). Reports he does have a fear of falling while walking. States he has circulation issues in his legs that has returned and has not told doctor. Completes water aerobics 2-3x a week. Patient states he also has shoulder pain and back pain, and that shoulder hurts is when he raises arm in abduction against resistance and when rolling over in bed. Used to enjoy playing golf. Would like to be able to walk around the block with his wife.     Limitations  Standing;Walking;House hold activities    How long can you stand comfortably?  5 minutes    How long can you walk  comfortably?  5 minutes     Patient Stated Goals  walk around block with wife, prevent falls,     Currently in Pain?  No/denies    Pain Score  0-No pain    Pain Onset  More than a month ago       TREATMENT Neuromuscular Re-education  // bars: Normal stance on Airex pad 2x60sec, CGA and VCs to prevent loss of balance  Airex pad toe taps to 6" step faded UE support x10 each leg, CGA and VCs to prevent loss of balance   Side stepping to 6 inch stool x 20 left and right , 50 % UE support  Fwd stepping off front of step stool and backward stepping up to stool x 20 with UE support  Stair training (5 steps, single railing LUE for descent faded support) x2 times, CGA/MIN Ax1 VC  whole  foot placement on stair tread, step-to pattern when not using UE .  Ambulation in the hallway 4x80 feet with head turns and speed changes, no AD, CGA/MIN A. VCs for safety awareness provided throughout.  Seated rest breaks provided throughout the session with education o postural changes to prevent LOB/falls. Pt was able to progress dynamic balance exercises today, noting improved postural reactions with LOB and moving outside normal BOS.                         PT Education - 10/18/18 1434    Education provided  Yes    Education Details  HEP    Person(s) Educated  Patient    Methods  Explanation    Comprehension  Verbalized understanding       PT Short Term Goals - 10/16/18 1713      PT SHORT TERM GOAL #1   Title  Patient will be independent with completion of HEP to improve ability to complete functional tasks.    Baseline  HEP compliant    Time  2    Period  Weeks    Status  Achieved      PT SHORT TERM GOAL #2   Title  Patient will report no falls in the next two weeks to demonstrate improved safety and fall risk.     Baseline  07/09/18: fell 2x in 6 weeks 1/7: no falls    Time  2    Period  Weeks    Status  Achieved        PT Long Term Goals - 10/16/18 1550      PT LONG TERM GOAL #1   Title  Patient will improve 5x sit to stand to <10seconds without UE support to demonstrate improvements in functional strength.     Baseline  07/09/18: 13sec hands on knees 1/7: 13 seconds occasional hand on knee    Time  6    Period  Weeks    Status  Partially Met    Target Date  11/27/18      PT LONG TERM GOAL #2   Title  Patient will increase BERG balance score to >52/56 to decrease fall risk and improve balance.     Baseline  07/09/18: 47/56 1/7: 52/56     Time  6    Period  Weeks    Status  Achieved      PT LONG TERM GOAL #3   Title  Patient will be able to walk with wife around neighborhood (>40mnutes) to demonstrate improved balance and community  ambulation.  Baseline  07/09/18: 549mnutes 1/7: wife just returned     Time  6    Period  Weeks    Status  On-going    Target Date  11/27/18      PT LONG TERM GOAL #4   Title  Patient will improve 129m to >1.49m96mlimiting fall risk and improve community ambulation.     Baseline  07/09/18: 0.833m7m1/7: 1.25 m/s     Time  6    Period  Weeks    Status  Achieved      PT LONG TERM GOAL #5   Title  Patient will increase DGI to >18/24 to indicate increased stability with dynamic mobility and decrease fall risk.     Baseline  1/7: 14/24     Time  6    Period  Weeks    Status  New    Target Date  11/27/18      PT LONG TERM GOAL #6   Title  Pt able to descend 8 steps with single UE assist with no LOB or heel catching on step in order to improve community access and safety on steps.    Baseline  1/7: BUE support and heel catching    Time  6    Period  Weeks    Status  New    Target Date  11/27/18            Plan - 10/18/18 1435    Clinical Impression Statement  Patient required min verbal cueing during matrix machine stepping, and required CGA during all dynamic standing balance activities. Patient required occasional rest breaks between exercises due to fatigue. Patient tolerated exercise well. Patient will continue to benefit from skilled therapy in order to improve dynamic standing balance activities and increase gait speed to reduce risk for falls    Rehab Potential  Good    Clinical Impairments Affecting Rehab Potential  (+) support system (-) age;     PT Frequency  2x / week    PT Duration  6 weeks    PT Treatment/Interventions  ADLs/Self Care Home Management;Energy conservation;Patient/family education;Therapeutic exercise;Balance training;Neuromuscular re-education;Therapeutic activities;Functional mobility training;Stair training;DME Instruction;Gait training;Aquatic Therapy;Cryotherapy;Electrical Stimulation;Iontophoresis 4mg/84mDexamethasone;Moist  Heat;Traction;Ultrasound;Taping;Passive range of motion;Vestibular;Manual techniques    PT Next Visit Plan  review HEP, strength, balance     PT Home Exercise Plan  standing hip abduction, standing marches, modified tandem stance    Consulted and Agree with Plan of Care  Patient       Patient will benefit from skilled therapeutic intervention in order to improve the following deficits and impairments:  Abnormal gait, Decreased activity tolerance, Decreased balance, Decreased coordination, Decreased endurance, Decreased mobility, Decreased strength, Difficulty walking, Impaired sensation, Pain, Impaired UE functional use, Postural dysfunction, Improper body mechanics, Impaired perceived functional ability, Decreased knowledge of use of DME  Visit Diagnosis: Unsteady gait  Muscle weakness (generalized)  Loss of balance  Impairment of balance  Gait difficulty     Problem List There are no active problems to display for this patient.   Mansf499 Ocean StreetDPVirginia1/06/2019, 3:06 PM  [facility]ABColumb[facility] HospitalICES [address]uHastings [id number]: [phone]:  [phone]: MauriJORRELL KUSTER [phone] of Birth: 7/3/126-Feb-1933

## 2018-10-23 ENCOUNTER — Ambulatory Visit: Payer: Medicare Other

## 2018-10-23 DIAGNOSIS — M6281 Muscle weakness (generalized): Secondary | ICD-10-CM

## 2018-10-23 DIAGNOSIS — R2681 Unsteadiness on feet: Secondary | ICD-10-CM | POA: Diagnosis not present

## 2018-10-23 DIAGNOSIS — R2689 Other abnormalities of gait and mobility: Secondary | ICD-10-CM

## 2018-10-23 NOTE — Therapy (Signed)
[place] MAIN [facility] SERVICES [address] [place], [place], [address] Phone: [phone]   Fax:  [phone]  Physical Therapy Treatment   Patient Details  Name: Eric Torres MRN: [id number] Date of Birth: [birth year] Referring Provider (PT): Mcneil Sober   Encounter Date: 10/23/2018  PT End of Session - 10/23/18 1621    Visit Number  18    Number of Visits  28    Date for PT Re-Evaluation  11/27/18    Authorization Type  2/10 PN start 10/16/18     PT Start Time  1102    PT Stop Time  1151    PT Time Calculation (min)  49 min    Equipment Utilized During Treatment  Gait belt    Activity Tolerance  Patient tolerated treatment well;Patient limited by fatigue    Behavior During Therapy  [facility] for tasks assessed/performed       Past Medical History:  Diagnosis Date  . BPH (benign prostatic hyperplasia)   . 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 COPD (chronic obstructive pulmonary disease) (HCC)    MILD-NO INHALERS PER PT  . Essential tremor   . Heart murmur   . History of brachytherapy   . History of TIA (transient ischemic attack) 2015   LEFT THUMB AND INDEX FINGER-SOMETIMES I DROP THINGS  . Hypercholesteremia   . Hypertension   . Hypothyroidism     Past Surgical History:  Procedure Laterality Date  . CHOLECYSTECTOMY    . CYSTOSCOPY WITH BIOPSY N/A 12/29/2015   Procedure: CYSTOSCOPY WITH BIOPSY;  Surgeon: Royston Cowper, MD;  Location: [facility] ORS;  Service: Urology;  Laterality: N/A;  . EXPLORATORY LAPAROTOMY  1991   VOLVULUS AND BOWEL OBSTRUCTION  . ILIAC ARTERY STENT Bilateral   . TONSILLECTOMY    . TRANSURETHRAL RESECTION OF BLADDER TUMOR N/A 12/29/2015   Procedure: TRANSURETHRAL RESECTION OF BLADDER TUMOR (TURBT)/MITOMYCIN INSTILLATION;  Surgeon: Royston Cowper, MD;  Location: [facility] ORS;  Service: Urology;  Laterality: N/A;  . TRANSURETHRAL RESECTION OF BLADDER TUMOR WITH GYRUS (TURBT-GYRUS)  2013  . VASECTOMY      There  were no vitals filed for this visit.  Subjective Assessment - 10/23/18 1107    Subjective  Patient overdid it yesterday at the gym to the point he was losing his balanc afterwards and this morning. Is slightly sore but no pain.     Pertinent History  Patient reports his main issue is his balance. States he has a new doctor that refered him to this clinic instead of outpatient in [facility] where he has received physical therapy prior. Has received therapy for balance in the past (approximately 1 year ago). States he has fallen twice recently when he tripped over leg lift of recliner (6 weeks ago), and fell carrying groceries while trying to open door (4 weeks ago). Reports he does have a fear of falling while walking. States he has circulation issues in his legs that has returned and has not told doctor. Completes water aerobics 2-3x a week. Patient states he also has shoulder pain and back pain, and that shoulder hurts is when he raises arm in abduction against resistance and when rolling over in bed. Used to enjoy playing golf. Would like to be able to walk around the block with his wife.     Limitations  Standing;Walking;House hold activities    How long can you stand comfortably?  5 minutes    How long can  you walk comfortably?  5 minutes     Patient Stated Goals  walk around block with wife, prevent falls,     Currently in Pain?  No/denies       Standing balloon taps on airex pad reaching in/out of BOS x2 min    Basketball shoot standing on blue foam with modified tandem stance in parallel bars with pertubation's from body mechanics of shot, CGA x2 minutes   Eccentric step down front of 4 in. step x10 each LE. BUE faded to 1UE support. CGA. Verbal cues to tap heel. improved ability to keep feet an equal distance apart    Airex pad: eyes closed 3x 30 seconds. CGA no episodes of LOB   Picking coin up from floor, cueing for widening BOS to decrease forward momentum x 2 trials of 5 reps. Verbal  cueing for placement of feet, flexing knees and pushing through heels.     Walking in hallway with lateral pivots to L and R depending on PT guidance. 2x86 ft, CGA  Two episodes of instability  Side stepping in hallway 2x 50 ft each direction, cueing for keeping feet neutral position, CGA  Slow motion ambulation with large movements focusing on hip flexion and heel strike 2x 86 ft. CGA challenge lifting LLE with two episodes of instability.    Patient requires CGA throughout session with dynamic mobility due to frequent posterior LOB when on unstable surface, performing dynamic mobility, or dual tasking.                        PT Education - 10/23/18 1620    Education provided  Yes    Education Details  continued compliance with HEP, stability     Person(s) Educated  Patient    Methods  Explanation;Demonstration;Tactile cues;Verbal cues    Comprehension  Verbalized understanding;Returned demonstration;Tactile cues required;Need further instruction;Verbal cues required       PT Short Term Goals - 10/16/18 1713      PT SHORT TERM GOAL #1   Title  Patient will be independent with completion of HEP to improve ability to complete functional tasks.    Baseline  HEP compliant    Time  2    Period  Weeks    Status  Achieved      PT SHORT TERM GOAL #2   Title  Patient will report no falls in the next two weeks to demonstrate improved safety and fall risk.     Baseline  07/09/18: fell 2x in 6 weeks 1/7: no falls    Time  2    Period  Weeks    Status  Achieved        PT Long Term Goals - 10/16/18 1550      PT LONG TERM GOAL #1   Title  Patient will improve 5x sit to stand to <10seconds without UE support to demonstrate improvements in functional strength.     Baseline  07/09/18: 13sec hands on knees 1/7: 13 seconds occasional hand on knee    Time  6    Period  Weeks    Status  Partially Met    Target Date  11/27/18      PT LONG TERM GOAL #2   Title  Patient  will increase BERG balance score to >52/56 to decrease fall risk and improve balance.     Baseline  07/09/18: 47/56 1/7: 52/56     Time  6    Period  Weeks    Status  Achieved      PT LONG TERM GOAL #3   Title  Patient will be able to walk with wife around neighborhood (>9mnutes) to demonstrate improved balance and community ambulation.     Baseline  07/09/18: 528mutes 1/7: wife just returned     Time  6    Period  Weeks    Status  On-going    Target Date  11/27/18      PT LONG TERM GOAL #4   Title  Patient will improve 1044mto >1.66m/61mimiting fall risk and improve community ambulation.     Baseline  07/09/18: 0.833m/63m/7: 1.25 m/s     Time  6    Period  Weeks    Status  Achieved      PT LONG TERM GOAL #5   Title  Patient will increase DGI to >18/24 to indicate increased stability with dynamic mobility and decrease fall risk.     Baseline  1/7: 14/24     Time  6    Period  Weeks    Status  New    Target Date  11/27/18      PT LONG TERM GOAL #6   Title  Pt able to descend 8 steps with single UE assist with no LOB or heel catching on step in order to improve community access and safety on steps.    Baseline  1/7: BUE support and heel catching    Time  6    Period  Weeks    Status  New    Target Date  11/27/18            Plan - 10/23/18 1623    Clinical Impression Statement  Patient reports he is not pleased with his therapy progression and does not feel like it is helping him despite discussion about progression on goals two sessions prior. Patient educated on reasoning behind interventions and how he has progressed and discussed meeting with the vestibular therapist next session to reinforce understanding. Patient will continue to benefit from skilled therapy in order to improve dynamic standing balance activities and increase gait speed to reduce risk for falls    Rehab Potential  Good    Clinical Impairments Affecting Rehab Potential  (+) support system (-) age;      PT Frequency  2x / week    PT Duration  6 weeks    PT Treatment/Interventions  ADLs/Self Care Home Management;Energy conservation;Patient/family education;Therapeutic exercise;Balance training;Neuromuscular re-education;Therapeutic activities;Functional mobility training;Stair training;DME Instruction;Gait training;Aquatic Therapy;Cryotherapy;Electrical Stimulation;Iontophoresis 4mg/m64mexamethasone;Moist Heat;Traction;Ultrasound;Taping;Passive range of motion;Vestibular;Manual techniques    PT Next Visit Plan  review HEP, strength, balance     PT Home Exercise Plan  standing hip abduction, standing marches, modified tandem stance    Consulted and Agree with Plan of Care  Patient       Patient will benefit from skilled therapeutic intervention in order to improve the following deficits and impairments:  Abnormal gait, Decreased activity tolerance, Decreased balance, Decreased coordination, Decreased endurance, Decreased mobility, Decreased strength, Difficulty walking, Impaired sensation, Pain, Impaired UE functional use, Postural dysfunction, Improper body mechanics, Impaired perceived functional ability, Decreased knowledge of use of DME  Visit Diagnosis: Unsteady gait  Muscle weakness (generalized)  Loss of balance     Problem List There are no active problems to display for this patient.  MarinaJanna ArchDPT   10/23/2018, 4:24 PM  [facility] [address]  [place], [place], [address] Phone: [phone]   Fax:  [phone]  Name: EMMERICH CRYER MRN: [id number] Date of Birth: [birth year]

## 2018-10-25 ENCOUNTER — Ambulatory Visit: Payer: Medicare Other

## 2018-10-25 VITALS — BP 177/68 | HR 57

## 2018-10-25 DIAGNOSIS — R269 Unspecified abnormalities of gait and mobility: Secondary | ICD-10-CM

## 2018-10-25 DIAGNOSIS — R2681 Unsteadiness on feet: Secondary | ICD-10-CM | POA: Diagnosis not present

## 2018-10-25 DIAGNOSIS — M6281 Muscle weakness (generalized): Secondary | ICD-10-CM

## 2018-10-25 NOTE — Therapy (Signed)
[place] MAIN [facility] SERVICES [address] [place], [place], [address] Phone: [phone]   Fax:  [phone]  Physical Therapy Treatment  Patient Details  Name: Eric Torres MRN: [id number] Date of Birth: 12-30-1931 Referring Provider (PT): Mcneil Sober   Encounter Date: 10/25/2018  PT End of Session - 10/25/18 1401    Visit Number  19    Number of Visits  28    Date for PT Re-Evaluation  11/27/18    Authorization Type  Last goals 10/16/18     PT Start Time  1350    PT Stop Time  1430    PT Time Calculation (min)  40 min    Equipment Utilized During Treatment  Gait belt    Activity Tolerance  Patient tolerated treatment well    Behavior During Therapy  WFL for tasks assessed/performed       Past Medical History:  Diagnosis Date  . BPH (benign prostatic hyperplasia)   . 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 COPD (chronic obstructive pulmonary disease) (HCC)    MILD-NO INHALERS PER PT  . Essential tremor   . Heart murmur   . History of brachytherapy   . History of TIA (transient ischemic attack) 2015   LEFT THUMB AND INDEX FINGER-SOMETIMES I DROP THINGS  . Hypercholesteremia   . Hypertension   . Hypothyroidism     Past Surgical History:  Procedure Laterality Date  . CHOLECYSTECTOMY    . CYSTOSCOPY WITH BIOPSY N/A 12/29/2015   Procedure: CYSTOSCOPY WITH BIOPSY;  Surgeon: Royston Cowper, MD;  Location: [facility] ORS;  Service: Urology;  Laterality: N/A;  . EXPLORATORY LAPAROTOMY  1991   VOLVULUS AND BOWEL OBSTRUCTION  . ILIAC ARTERY STENT Bilateral   . TONSILLECTOMY    . TRANSURETHRAL RESECTION OF BLADDER TUMOR N/A 12/29/2015   Procedure: TRANSURETHRAL RESECTION OF BLADDER TUMOR (TURBT)/MITOMYCIN INSTILLATION;  Surgeon: Royston Cowper, MD;  Location: [facility] ORS;  Service: Urology;  Laterality: N/A;  . TRANSURETHRAL RESECTION OF BLADDER TUMOR WITH GYRUS (TURBT-GYRUS)  2013  . VASECTOMY      Vitals:   10/25/18 1357 10/25/18 1404   BP: (!) 181/86 (!) 177/68  Pulse: (!) 57 (!) 57    Subjective Assessment - 10/25/18 1352    Subjective  Pt states that he has continued to struggle with his balance. He was at the doctors office this morning and states that when he raised up from laying down he felt dizzy. He describes symptoms as light-headedness. Symptoms have been persistent for multiple years.     Pertinent History  Patient reports his main issue is his balance. States he has a new doctor that refered him to this clinic instead of outpatient in [facility] where he has received physical therapy prior. Has received therapy for balance in the past (approximately 1 year ago). States he has fallen twice recently when he tripped over leg lift of recliner (6 weeks ago), and fell carrying groceries while trying to open door (4 weeks ago). Reports he does have a fear of falling while walking. States he has circulation issues in his legs that has returned and has not told doctor. Completes water aerobics 2-3x a week. Patient states he also has shoulder pain and back pain, and that shoulder hurts is when he raises arm in abduction against resistance and when rolling over in bed. Used to enjoy playing golf. Would like to be able to walk around the block with his wife.  Limitations  Standing;Walking;House hold activities    How long can you stand comfortably?  5 minutes    How long can you walk comfortably?  5 minutes     Patient Stated Goals  walk around block with wife, prevent falls,     Currently in Pain?  No/denies           VESTIBULAR AND BALANCE SCREEN   HISTORY:  Description of dizziness: (vertigo, unsteadiness, lightheadedness, falling, general unsteadiness, whoozy, swimmy-headed sensation, aural fullness) Dizziness, lightheadedness. No vertigo Frequency: Daily Duration: Seconds Symptom nature: (motion provoked, positional, spontaneous, constant, variable, intermittent): Motion provoked, position changes;  Provocative  Factors: bending forward, quick head turns Easing Factors: Unknown    EXAMINATION     POSTURAL CONTROL TESTS:   Modified Clinical Test of Sensory Interaction for Balance    (mCTSIB):  CONDITION TIME STRATEGY SWAY  Eyes open, firm surface 30 seconds ankle 1+  Eyes closed, firm surface 30 seconds ankle 2+  Eyes open, foam surface 30 seconds ankle 2+  Eyes closed, foam surface 30 seconds ankle 3+    OCULOMOTOR / VESTIBULAR TESTING:  Oculomotor Exam- Room Light  Findings Comments  Ocular Alignment normal   Ocular ROM normal   Spontaneous Nystagmus normal   End-Gaze Nystagmus normal   Smooth Pursuit abnormal Vertical nystagmus with vertical gaze, otherwise gaze is smooth during horizontal smooth pursuit  Saccades abnormal Hypometric when looking to the right  VOR Pt unable to relax   VOR Cancellation Pt unable to relax   Left Head Thrust Pt unable to relax   Right Head Thrust Pt unable to relax   Head Shaking Nystagmus not examined   Static Acuity not examined   Dynamic Acuity not examined     Oculomotor Exam- Fixation Suppressed: Deferred  BPPV TESTS:  Symptoms Duration Intensity Nystagmus  L Dix-Hallpike None   None  R Dix-Hallpike None   None  L Head Roll None   2-3 beats of pure horizontal apogeotrophic nystagmus  R Head Roll Mild dizziness. No vertigo. Not a positive reproduction of symptoms   10-12 beats of pure horizontal apogeotrophic nystagmus.   L Sidelying Test      R Sidelying Test         Finger to nose: abnormal bilaterally; Heel to shin: WNL Finger opposition: WNL Rapid alternating movements: WNL Pronator drift: Negative   Extensive education with patient regarding findings of vestibular screening and plan of care.    Oculomotor/vestibular screen performed with patient today. Head impulse test is difficult to assess as pt is unable to relax his neck to allow for rotation. He has central findings with vertical nystagmus during vertical gaze as  well as hypometric saccades to the right. He is able to balance for a full 30s during all 4 conditions of mCTSIB. Dix-Hallpike testing is negative. He does have a few beats of horizontal apogeotrophic nystagmus with roll test bilaterally but denies vertigo. Nystagmus is worse with testing on the R side. Will consider Gufoni maneuver to treat L horizontal canal if indicated. 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 This is the most likely cause for the patients complaints in addition to age related balance loss. Pt encouraged to continue HEP and follow-up as scheduled. Pt will benefit from PT services to address deficits in strength, balance, and mobility in order to return to full function at home.  PT Short Term Goals - 10/16/18 1713      PT SHORT TERM GOAL #1   Title  Patient will be independent with completion of HEP to improve ability to complete functional tasks.    Baseline  HEP compliant    Time  2    Period  Weeks    Status  Achieved      PT SHORT TERM GOAL #2   Title  Patient will report no falls in the next two weeks to demonstrate improved safety and fall risk.     Baseline  07/09/18: fell 2x in 6 weeks 1/7: no falls    Time  2    Period  Weeks    Status  Achieved        PT Long Term Goals - 10/16/18 1550      PT LONG TERM GOAL #1   Title  Patient will improve 5x sit to stand to <10seconds without UE support to demonstrate improvements in functional strength.     Baseline  07/09/18: 13sec hands on knees 1/7: 13 seconds occasional hand on knee    Time  6    Period  Weeks    Status  Partially Met    Target Date  11/27/18      PT LONG TERM GOAL #2   Title  Patient will increase BERG balance score to >52/56 to decrease fall risk and improve balance.     Baseline  07/09/18: 47/56 1/7: 52/56     Time  6    Period  Weeks    Status  Achieved      PT LONG TERM GOAL #3   Title   Patient will be able to walk with wife around neighborhood (>896mnutes) to demonstrate improved balance and community ambulation.     Baseline  07/09/18: 518mutes 1/7: wife just returned     Time  6    Period  Weeks    Status  On-going    Target Date  11/27/18      PT LONG TERM GOAL #4   Title  Patient will improve 1073mto >1.96m/4mimiting fall risk and improve community ambulation.     Baseline  07/09/18: 0.833m/32m/7: 1.25 m/s     Time  6    Period  Weeks    Status  Achieved      PT LONG TERM GOAL #5   Title  Patient will increase DGI to >18/24 to indicate increased stability with dynamic mobility and decrease fall risk.     Baseline  1/7: 14/24     Time  6    Period  Weeks    Status  New    Target Date  11/27/18      PT LONG TERM GOAL #6   Title  Pt able to descend 8 steps with single UE assist with no LOB or heel catching on step in order to improve community access and safety on steps.    Baseline  1/7: BUE support and heel catching    Time  6    Period  Weeks    Status  New    Target Date  11/27/18            Plan - 10/25/18 1401    Clinical Impression Statement  Oculomotor/vestibular screen performed with patient today. Head impulse test is difficult to assess as pt is unable to relax his neck to allow for rotation. He has central findings with vertical nystagmus during vertical  gaze as well as hypometric saccades to the right. He is able to balance for a full 30s during all 4 conditions of mCTSIB. Dix-Hallpike testing is negative. He does have a few beats of horizontal apogeotrophic nystagmus with roll test bilaterally but denies vertigo. Nystagmus is worse with testing on the R side. Will consider Gufoni maneuver to treat L horizontal canal if indicated. 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 This is the most likely cause for the patients complaints in addition to age related balance loss. Pt encouraged to  continue HEP and follow-up as scheduled. Pt will benefit from PT services to address deficits in strength, balance, and mobility in order to return to full function at home.     Rehab Potential  Good    Clinical Impairments Affecting Rehab Potential  (+) support system (-) age;     PT Frequency  2x / week    PT Duration  6 weeks    PT Treatment/Interventions  ADLs/Self Care Home Management;Energy conservation;Patient/family education;Therapeutic exercise;Balance training;Neuromuscular re-education;Therapeutic activities;Functional mobility training;Stair training;DME Instruction;Gait training;Aquatic Therapy;Cryotherapy;Electrical Stimulation;Iontophoresis 65m/ml Dexamethasone;Moist Heat;Traction;Ultrasound;Taping;Passive range of motion;Vestibular;Manual techniques    PT Next Visit Plan  Consider gufoni to treat L side, review HEP, strength, balance     PT Home Exercise Plan  standing hip abduction, standing marches, modified tandem stance    Consulted and Agree with Plan of Care  Patient       Patient will benefit from skilled therapeutic intervention in order to improve the following deficits and impairments:  Abnormal gait, Decreased activity tolerance, Decreased balance, Decreased coordination, Decreased endurance, Decreased mobility, Decreased strength, Difficulty walking, Impaired sensation, Pain, Impaired UE functional use, Postural dysfunction, Improper body mechanics, Impaired perceived functional ability, Decreased knowledge of use of DME  Visit Diagnosis: Muscle weakness (generalized)  Gait difficulty     Problem List There are no active problems to display for this patient.  Eric GroutPT, DPT, GCS  Eric Torres 10/26/2018, 12:41 PM  [place] [facility] [address] [address]Phone: [phone]  Fax:  [phone] Name: MLUKEN SHADOWENSMRN: [id number] of Birth: 711/21/1933

## 2018-10-30 ENCOUNTER — Ambulatory Visit: Payer: Medicare Other

## 2018-10-30 DIAGNOSIS — R2689 Other abnormalities of gait and mobility: Secondary | ICD-10-CM

## 2018-10-30 DIAGNOSIS — R2681 Unsteadiness on feet: Secondary | ICD-10-CM

## 2018-10-30 DIAGNOSIS — M6281 Muscle weakness (generalized): Secondary | ICD-10-CM

## 2018-10-30 DIAGNOSIS — R269 Unspecified abnormalities of gait and mobility: Secondary | ICD-10-CM

## 2018-10-30 NOTE — Therapy (Signed)
[place] MAIN [facility] SERVICES [address] [place], [place], [address] Phone: [phone]   Fax:  [phone]  Physical Therapy Treatment & Progress Note  Patient Details  Name: Eric Torres MRN: [id number] Date of Birth: November 07, 1931 Referring Provider (PT): Mcneil Sober   Encounter Date: 10/30/2018  PT End of Session - 10/30/18 1435    Visit Number  20    Number of Visits  28    Date for PT Re-Evaluation  11/27/18    Authorization Type  Last goals 10/16/18     PT Start Time  1430    PT Stop Time  1515    PT Time Calculation (min)  45 min    Equipment Utilized During Treatment  Gait belt    Activity Tolerance  Patient tolerated treatment well    Behavior During Therapy  WFL for tasks assessed/performed       Past Medical History:  Diagnosis Date  . BPH (benign prostatic hyperplasia)   . 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 COPD (chronic obstructive pulmonary disease) (HCC)    MILD-NO INHALERS PER PT  . Essential tremor   . Heart murmur   . History of brachytherapy   . History of TIA (transient ischemic attack) 2015   LEFT THUMB AND INDEX FINGER-SOMETIMES I DROP THINGS  . Hypercholesteremia   . Hypertension   . Hypothyroidism     Past Surgical History:  Procedure Laterality Date  . CHOLECYSTECTOMY    . CYSTOSCOPY WITH BIOPSY N/A 12/29/2015   Procedure: CYSTOSCOPY WITH BIOPSY;  Surgeon: Royston Cowper, MD;  Location: [facility] ORS;  Service: Urology;  Laterality: N/A;  . EXPLORATORY LAPAROTOMY  1991   VOLVULUS AND BOWEL OBSTRUCTION  . ILIAC ARTERY STENT Bilateral   . TONSILLECTOMY    . TRANSURETHRAL RESECTION OF BLADDER TUMOR N/A 12/29/2015   Procedure: TRANSURETHRAL RESECTION OF BLADDER TUMOR (TURBT)/MITOMYCIN INSTILLATION;  Surgeon: Royston Cowper, MD;  Location: [facility] ORS;  Service: Urology;  Laterality: N/A;  . TRANSURETHRAL RESECTION OF BLADDER TUMOR WITH GYRUS (TURBT-GYRUS)  2013  . VASECTOMY      There were no vitals  filed for this visit.  Subjective Assessment - 10/30/18 1438    Subjective  Patient with bandaid on head, had a spot removed by dermatologist on Thursday. Pt denies any falls since last session.      Pertinent History  Patient reports his main issue is his balance. States he has a new doctor that refered him to this clinic instead of [facility] he has [facility] physical therapy prior. Has received therapy for balance in the past (approximately 1 year ago). States he has fallen twice recently when he tripped over leg lift of recliner (6 weeks ago), and fell carrying groceries while trying to open door (4 weeks ago). Reports he does have a fear of falling while walking. States he has circulation issues in his legs that has returned and has not told doctor. Completes water aerobics 2-3x a week. Patient states he also has shoulder pain and back pain, and that shoulder hurts is when he raises arm in abduction against resistance and when rolling over in bed. Used to enjoy playing golf. Would like to be able to walk around the block with his wife.     Limitations  Standing;Walking;House hold activities    How long can you stand comfortably?  5 minutes    How long can you walk comfortably?  5 minutes  Patient Stated Goals  walk around block with wife, prevent falls,     Currently in Pain?  No/denies       TREATMENT: Discussed POC, expectations, goal of therapy, progress expectations. See clinical impression for further details.  LTR 10x with verbal and tactile cues Clamshell in sidelying x15 bilaterally with RTB Piriformis stretch knee to opposite shoulder 2 x30sec on RLE  Administered handout, reviewed, agreeable to perform at home.    PT Education - 10/30/18 1444    Education provided  Yes    Education Details  HEP compliance, stability    Person(s) Educated  Patient    Methods  Explanation;Demonstration;Tactile cues;Verbal cues    Comprehension  Verbalized  understanding;Verbal cues required       PT Short Term Goals - 10/16/18 1713      PT SHORT TERM GOAL #1   Title  Patient will be independent with completion of HEP to improve ability to complete functional tasks.    Baseline  HEP compliant    Time  2    Period  Weeks    Status  Achieved      PT SHORT TERM GOAL #2   Title  Patient will report no falls in the next two weeks to demonstrate improved safety and fall risk.     Baseline  07/09/18: fell 2x in 6 weeks 1/7: no falls    Time  2    Period  Weeks    Status  Achieved        PT Long Term Goals - 10/16/18 1550      PT LONG TERM GOAL #1   Title  Patient will improve 5x sit to stand to <10seconds without UE support to demonstrate improvements in functional strength.     Baseline  07/09/18: 13sec hands on knees 1/7: 13 seconds occasional hand on knee    Time  6    Period  Weeks    Status  Partially Met    Target Date  11/27/18      PT LONG TERM GOAL #2   Title  Patient will increase BERG balance score to >52/56 to decrease fall risk and improve balance.     Baseline  07/09/18: 47/56 1/7: 52/56     Time  6    Period  Weeks    Status  Achieved      PT LONG TERM GOAL #3   Title  Patient will be able to walk with wife around neighborhood (>631mnutes) to demonstrate improved balance and community ambulation.     Baseline  07/09/18: 581mutes 1/7: wife just returned     Time  6    Period  Weeks    Status  On-going    Target Date  11/27/18      PT LONG TERM GOAL #4   Title  Patient will improve 1077mto >1.31m/831mimiting fall risk and improve community ambulation.     Baseline  07/09/18: 0.833m/15m/7: 1.25 m/s     Time  6    Period  Weeks    Status  Achieved      PT LONG TERM GOAL #5   Title  Patient will increase DGI to >18/24 to indicate increased stability with dynamic mobility and decrease fall risk.     Baseline  1/7: 14/24     Time  6    Period  Weeks    Status  New    Target Date  11/27/18  PT LONG TERM GOAL  #6   Title  Pt able to descend 8 steps with single UE assist with no LOB or heel catching on step in order to improve community access and safety on steps.    Baseline  1/7: BUE support and heel catching    Time  6    Period  Weeks    Status  New    Target Date  11/27/18            Plan - 10/31/18 1243    Clinical Impression Statement  PT and patient spent extensive time today reviewing PT role, expectations of PT, and plan of care. Spoke of setting realistic goals/expectations, as well as dicussion of progression so far in therapy. Pt agreeable to think about what realsitic, specific goals he would like to focus on. Pt also complained of hip pain, R>L that increases with extended ambulation. Remaining amount of session focused on assessing gait mechanics, as well as response to interventions to address hip weakness/pain. HEP updated, though would benefit from further exercises for home to encourage compliance. Overall the pt did report that physical therapy is helping, he has learned to "think before moving", improved confidence and transfers/strength. The patient would benefit from further skilled PT to continue to progress towards goals to maximize safety and mobility.     Rehab Potential  Good    Clinical Impairments Affecting Rehab Potential  (+) support system (-) age;     PT Frequency  2x / week    PT Duration  6 weeks    PT Treatment/Interventions  ADLs/Self Care Home Management;Energy conservation;Patient/family education;Therapeutic exercise;Balance training;Neuromuscular re-education;Therapeutic activities;Functional mobility training;Stair training;DME Instruction;Gait training;Aquatic Therapy;Cryotherapy;Electrical Stimulation;Iontophoresis 20m/ml Dexamethasone;Moist Heat;Traction;Ultrasound;Taping;Passive range of motion;Vestibular;Manual techniques    PT Next Visit Plan  Consider gufoni to treat L side, review HEP, strength, balance     PT Home Exercise Plan  standing hip  abduction, standing marches, modified tandem stance    Consulted and Agree with Plan of Care  Patient       Patient will benefit from skilled therapeutic intervention in order to improve the following deficits and impairments:  Abnormal gait, Decreased activity tolerance, Decreased balance, Decreased coordination, Decreased endurance, Decreased mobility, Decreased strength, Difficulty walking, Impaired sensation, Pain, Impaired UE functional use, Postural dysfunction, Improper body mechanics, Impaired perceived functional ability, Decreased knowledge of use of DME  Visit Diagnosis: Muscle weakness (generalized)  Gait difficulty  Unsteady gait  Loss of balance     Problem List There are no active problems to display for this patient.   DLieutenant DiegoPT, DPT 12:56 PM,10/31/18 [address] RTrinity HospitalsSERVICES [address] [address]Phone: [phone]  Fax:  [phone] Name: MABIJAH ROUSSELMRN: [id number] of Birth: [birth year]

## 2018-10-30 NOTE — Patient Instructions (Signed)
Access Code: [email or web address]  URL: [email or web address]  Date: 10/30/2018  Prepared by: Lieutenant Diego   Exercises  Supine Lower Trunk Rotation - 10 reps - 3 sets - 1x daily - 7x weekly  Hooklying Clamshell with Resistance - 15 reps - 1 sets - 1x daily - 5x weekly  Clamshell with Resistance - 15 reps - 1 sets - 1x daily - 5x weekly

## 2018-11-01 ENCOUNTER — Ambulatory Visit: Payer: Medicare Other

## 2018-11-01 VITALS — BP 136/99

## 2018-11-01 DIAGNOSIS — M6281 Muscle weakness (generalized): Secondary | ICD-10-CM

## 2018-11-01 DIAGNOSIS — R2681 Unsteadiness on feet: Secondary | ICD-10-CM | POA: Diagnosis not present

## 2018-11-01 DIAGNOSIS — R269 Unspecified abnormalities of gait and mobility: Secondary | ICD-10-CM

## 2018-11-01 NOTE — Therapy (Signed)
[place] MAIN [facility] SERVICES [address] [place], [place], [address] Phone: [phone]   Fax:  [phone]  Physical Therapy Treatment  Patient Details  Name: Eric Torres MRN: [id number] Date of Birth: 1932/04/12 Referring Provider (PT): Mcneil Sober   Encounter Date: 11/01/2018  PT End of Session - 11/02/18 2053    Visit Number  21    Number of Visits  28    Date for PT Re-Evaluation  11/27/18    Authorization Type  Last goals 10/16/18     PT Start Time  1350    PT Stop Time  1432    PT Time Calculation (min)  42 min    Equipment Utilized During Treatment  Gait belt    Activity Tolerance  Patient tolerated treatment well    Behavior During Therapy  [facility] for tasks assessed/performed       Past Medical History:  Diagnosis Date  . BPH (benign prostatic hyperplasia)   . 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 COPD (chronic obstructive pulmonary disease) (HCC)    MILD-NO INHALERS PER PT  . Essential tremor   . Heart murmur   . History of brachytherapy   . History of TIA (transient ischemic attack) 2015   LEFT THUMB AND INDEX FINGER-SOMETIMES I DROP THINGS  . Hypercholesteremia   . Hypertension   . Hypothyroidism     Past Surgical History:  Procedure Laterality Date  . CHOLECYSTECTOMY    . CYSTOSCOPY WITH BIOPSY N/A 12/29/2015   Procedure: CYSTOSCOPY WITH BIOPSY;  Surgeon: Royston Cowper, MD;  Location: [facility] ORS;  Service: Urology;  Laterality: N/A;  . EXPLORATORY LAPAROTOMY  1991   VOLVULUS AND BOWEL OBSTRUCTION  . ILIAC ARTERY STENT Bilateral   . TONSILLECTOMY    . TRANSURETHRAL RESECTION OF BLADDER TUMOR N/A 12/29/2015   Procedure: TRANSURETHRAL RESECTION OF BLADDER TUMOR (TURBT)/MITOMYCIN INSTILLATION;  Surgeon: Royston Cowper, MD;  Location: [facility] ORS;  Service: Urology;  Laterality: N/A;  . TRANSURETHRAL RESECTION OF BLADDER TUMOR WITH GYRUS (TURBT-GYRUS)  2013  . VASECTOMY      Vitals:   11/01/18 1356  BP: (!)  136/99    Subjective Assessment - 11/01/18 1355    Subjective  Pt reports that he is doing well today except he fell out of his kitchen chair this morning. He was bending over to tie his shoe laces and just went forward. He didn't injure himself. No specific questions at this time.     Pertinent History  Patient reports his main issue is his balance. States he has a new doctor that refered him to this clinic instead of outpatient in [facility] where he has received physical therapy prior. Has received therapy for balance in the past (approximately 1 year ago). States he has fallen twice recently when he tripped over leg lift of recliner (6 weeks ago), and fell carrying groceries while trying to open door (4 weeks ago). Reports he does have a fear of falling while walking. States he has circulation issues in his legs that has returned and has not told doctor. Completes water aerobics 2-3x a week. Patient states he also has shoulder pain and back pain, and that shoulder hurts is when he raises arm in abduction against resistance and when rolling over in bed. Used to enjoy playing golf. Would like to be able to walk around the block with his wife.     Limitations  Standing;Walking;House hold activities    How  long can you stand comfortably?  5 minutes    How long can you walk comfortably?  5 minutes     Patient Stated Goals  walk around block with wife, prevent falls,            TREATMENT   Ther-ex  Octane HIIT L6-7 x 30s, L3 x 60s x 5 minutes for warm-up during history; Hip flexion marches without UE support 2.5# ankle weights (AW) x 15 bilateral; Heel/toe raises without UE support x 10 each; Hip abduction with UE support 2.5# AW x 15 bilateral; Standing HS curls 2.5# AW x 15 bilateral; Standing mini squats x 10 with UE support;   Neuromuscular Re-education  Horizontal canal testing performed twice on both sides and it is negative bilaterally for both nystagmus and vertigo; Gait in hallway  with horizontal ball toss to therapist x 75' each direction; Gait in hallway with vertical ball toss by therapist in front of patient with head/eye follow 75' x 2; Forward ambulation with eyes closed x 75'; Retro ambulation with ball pass over shoulder varying height x 75'; Retro ambulation with horizontal head turns x 75,' pt is the most unsteady with this exercise;   Pt educated throughout session about proper posture and technique with exercises. Improved exercise technique, movement at target joints, use of target muscles after min to mod verbal, visual, tactile cues. Patient requires CGA throughout session with dynamic mobility due to intermittent staggering during ambulation   Retested pt for horizontal canal BPPV and it is negative on this date for both nystagmus or vertigo. Strengthening performed in // bars with intermittent cues and rest breaks. He is able to participate in dynamic balance challenges with ball tosses during ambulation. He is the most unsteady with retro ambulation while performing head turns. Pt encouraged to continue HEP and follow-up as scheduled. Pt will benefit from PT services to address deficits in strength, balance, and mobility in order to return to full function at home.                       PT Short Term Goals - 10/16/18 1713      PT SHORT TERM GOAL #1   Title  Patient will be independent with completion of HEP to improve ability to complete functional tasks.    Baseline  HEP compliant    Time  2    Period  Weeks    Status  Achieved      PT SHORT TERM GOAL #2   Title  Patient will report no falls in the next two weeks to demonstrate improved safety and fall risk.     Baseline  07/09/18: fell 2x in 6 weeks 1/7: no falls    Time  2    Period  Weeks    Status  Achieved        PT Long Term Goals - 10/16/18 1550      PT LONG TERM GOAL #1   Title  Patient will improve 5x sit to stand to <10seconds without UE support to demonstrate  improvements in functional strength.     Baseline  07/09/18: 13sec hands on knees 1/7: 13 seconds occasional hand on knee    Time  6    Period  Weeks    Status  Partially Met    Target Date  11/27/18      PT LONG TERM GOAL #2   Title  Patient will increase BERG balance score to >52/56 to decrease  fall risk and improve balance.     Baseline  07/09/18: 47/56 1/7: 52/56     Time  6    Period  Weeks    Status  Achieved      PT LONG TERM GOAL #3   Title  Patient will be able to walk with wife around neighborhood (>31mnutes) to demonstrate improved balance and community ambulation.     Baseline  07/09/18: 5951mutes 1/7: wife just returned     Time  6    Period  Weeks    Status  On-going    Target Date  11/27/18      PT LONG TERM GOAL #4   Title  Patient will improve 106mto >1.51m/66mimiting fall risk and improve community ambulation.     Baseline  07/09/18: 0.833m/84m/7: 1.25 m/s     Time  6    Period  Weeks    Status  Achieved      PT LONG TERM GOAL #5   Title  Patient will increase DGI to >18/24 to indicate increased stability with dynamic mobility and decrease fall risk.     Baseline  1/7: 14/24     Time  6    Period  Weeks    Status  New    Target Date  11/27/18      PT LONG TERM GOAL #6   Title  Pt able to descend 8 steps with single UE assist with no LOB or heel catching on step in order to improve community access and safety on steps.    Baseline  1/7: BUE support and heel catching    Time  6    Period  Weeks    Status  New    Target Date  11/27/18            Plan - 11/02/18 2053    Clinical Impression Statement  Retested pt for horizontal canal BPPV and it is negative on this date for both nystagmus or vertigo. Strengthening performed in // bars with intermittent cues and rest breaks. He is able to participate in dynamic balance challenges with ball tosses during ambulation. He is the most unsteady with retro ambulation while performing head turns. Pt encouraged  to continue HEP and follow-up as scheduled. Pt will benefit from PT services to address deficits in strength, balance, and mobility in order to return to full function at home.     Rehab Potential  Good    Clinical Impairments Affecting Rehab Potential  (+) support system (-) age;     PT Frequency  2x / week    PT Duration  6 weeks    PT Treatment/Interventions  ADLs/Self Care Home Management;Energy conservation;Patient/family education;Therapeutic exercise;Balance training;Neuromuscular re-education;Therapeutic activities;Functional mobility training;Stair training;DME Instruction;Gait training;Aquatic Therapy;Cryotherapy;Electrical Stimulation;Iontophoresis 4mg/m24mexamethasone;Moist Heat;Traction;Ultrasound;Taping;Passive range of motion;Vestibular;Manual techniques    PT Next Visit Plan  review HEP, strength, balance     PT Home Exercise Plan  standing hip abduction, standing marches, modified tandem stance    Consulted and Agree with Plan of Care  Patient       Patient will benefit from skilled therapeutic intervention in order to improve the following deficits and impairments:  Abnormal gait, Decreased activity tolerance, Decreased balance, Decreased coordination, Decreased endurance, Decreased mobility, Decreased strength, Difficulty walking, Impaired sensation, Pain, Impaired UE functional use, Postural dysfunction, Improper body mechanics, Impaired perceived functional ability, Decreased knowledge of use of DME  Visit Diagnosis: Muscle weakness (generalized)  Gait difficulty  Problem List There are no active problems to display for this patient.  Phillips Grout PT, DPT, GCS  Sharde Gover 11/02/2018, 9:00 PM  [place] MAIN [facility] SERVICES [address] [place], [place], [address] Phone: [phone]   Fax:  [phone]  Name: BRYSE BLANCHETTE MRN: [id number] Date of Birth: [birth year]

## 2018-11-06 ENCOUNTER — Ambulatory Visit: Payer: Medicare Other

## 2018-11-06 DIAGNOSIS — R2681 Unsteadiness on feet: Secondary | ICD-10-CM

## 2018-11-06 DIAGNOSIS — M6281 Muscle weakness (generalized): Secondary | ICD-10-CM

## 2018-11-06 DIAGNOSIS — M25551 Pain in right hip: Secondary | ICD-10-CM

## 2018-11-06 DIAGNOSIS — R269 Unspecified abnormalities of gait and mobility: Secondary | ICD-10-CM

## 2018-11-06 DIAGNOSIS — M25552 Pain in left hip: Secondary | ICD-10-CM

## 2018-11-06 DIAGNOSIS — R2689 Other abnormalities of gait and mobility: Secondary | ICD-10-CM

## 2018-11-06 NOTE — Therapy (Signed)
[place] MAIN [facility] SERVICES [address] [place], [place], [address] Phone: [phone]   Fax:  [phone]  Physical Therapy Treatment  Patient Details  Name: Eric Torres MRN: [id number] Date of Birth: Jan 20, 1932 Referring Provider (PT): Mcneil Sober   Encounter Date: 11/06/2018  PT End of Session - 11/06/18 1525    Visit Number  22    Number of Visits  28    Date for PT Re-Evaluation  11/27/18    Authorization Type  Last goals 10/16/18     PT Start Time  1425    PT Stop Time  1510    PT Time Calculation (min)  45 min    Equipment Utilized During Treatment  Gait belt    Activity Tolerance  Patient tolerated treatment well    Behavior During Therapy  WFL for tasks assessed/performed       Past Medical History:  Diagnosis Date  . BPH (benign prostatic hyperplasia)   . 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 COPD (chronic obstructive pulmonary disease) (HCC)    MILD-NO INHALERS PER PT  . Essential tremor   . Heart murmur   . History of brachytherapy   . History of TIA (transient ischemic attack) 2015   LEFT THUMB AND INDEX FINGER-SOMETIMES I DROP THINGS  . Hypercholesteremia   . Hypertension   . Hypothyroidism     Past Surgical History:  Procedure Laterality Date  . CHOLECYSTECTOMY    . CYSTOSCOPY WITH BIOPSY N/A 12/29/2015   Procedure: CYSTOSCOPY WITH BIOPSY;  Surgeon: Royston Cowper, MD;  Location: [facility] ORS;  Service: Urology;  Laterality: N/A;  . EXPLORATORY LAPAROTOMY  1991   VOLVULUS AND BOWEL OBSTRUCTION  . ILIAC ARTERY STENT Bilateral   . TONSILLECTOMY    . TRANSURETHRAL RESECTION OF BLADDER TUMOR N/A 12/29/2015   Procedure: TRANSURETHRAL RESECTION OF BLADDER TUMOR (TURBT)/MITOMYCIN INSTILLATION;  Surgeon: Royston Cowper, MD;  Location: [facility] ORS;  Service: Urology;  Laterality: N/A;  . TRANSURETHRAL RESECTION OF BLADDER TUMOR WITH GYRUS (TURBT-GYRUS)  2013  . VASECTOMY      There were no vitals filed for this  visit.  Subjective Assessment - 11/06/18 1427    Subjective  Patient reported that he has not had any falls/stumbles since his last visit. Doing well today.    Pertinent History  Patient reports his main issue is his balance. States he has a new doctor that refered him to this clinic instead of outpatient in [facility] where he has received physical therapy prior. Has received therapy for balance in the past (approximately 1 year ago). States he has fallen twice recently when he tripped over leg lift of recliner (6 weeks ago), and fell carrying groceries while trying to open door (4 weeks ago). Reports he does have a fear of falling while walking. States he has circulation issues in his legs that has returned and has not told doctor. Completes water aerobics 2-3x a week. Patient states he also has shoulder pain and back pain, and that shoulder hurts is when he raises arm in abduction against resistance and when rolling over in bed. Used to enjoy playing golf. Would like to be able to walk around the block with his wife.     Limitations  Standing;Walking;House hold activities    How long can you stand comfortably?  5 minutes    How long can you walk comfortably?  5 minutes     Patient Stated Goals  walk  around block with wife, prevent falls,     Currently in Pain?  No/denies       TREATMENT   Ther-ex: pt performed with varying min-modAx1 verbal/visual/tactile cues to optimize movement, exercise technique, and form.  Octane HIIT L6-7 x 30s, L3 x 60s x 5 minutes for warm-up during history; Hip flexion marches without UE support 2.5# ankle weights (AW) x 15 bilateral; Heel/toe raises without UE support x 15 Seated L toe raises x10 due to weakness, given in HEP ; Hip abduction with UE support 2.5# AW 2  x10 bilateral; Standing HS curls 2.5# AW x 20 bilateral; Step over hurdles fwd back x15 ea with CGA-minAx1 Standing mini squats x 10 with single UE support; cues for proper squat technique      Neuromuscular Re-education  Gait in hallway with bouncing ball toss x 75' each direction; CGA Gait in hallway with vertical ball toss by therapist in front of patient with head/eye follow 75' x 2; CGA Forward ambulation with eyes closed x 75'; CGA Retro ambulation with ball pass over shoulder varying height x 75'; CGA-minAx1 Agility ladder one square at a time, heel strike and step length verbal cues, cga-minAx1 4 square with cues for fwd/back/diagonal; verbal cues for wider base of support, heel strike, knee extension, upright posture throughout; cga-minAx1     PT Education - 11/06/18 1428    Education provided  Yes    Education Details  therapeutic exercises/form    Person(s) Educated  Patient    Methods  Explanation;Tactile cues;Verbal cues    Comprehension  Verbalized understanding;Verbal cues required       PT Short Term Goals - 10/16/18 1713      PT SHORT TERM GOAL #1   Title  Patient will be independent with completion of HEP to improve ability to complete functional tasks.    Baseline  HEP compliant    Time  2    Period  Weeks    Status  Achieved      PT SHORT TERM GOAL #2   Title  Patient will report no falls in the next two weeks to demonstrate improved safety and fall risk.     Baseline  07/09/18: fell 2x in 6 weeks 1/7: no falls    Time  2    Period  Weeks    Status  Achieved        PT Long Term Goals - 10/16/18 1550      PT LONG TERM GOAL #1   Title  Patient will improve 5x sit to stand to <10seconds without UE support to demonstrate improvements in functional strength.     Baseline  07/09/18: 13sec hands on knees 1/7: 13 seconds occasional hand on knee    Time  6    Period  Weeks    Status  Partially Met    Target Date  11/27/18      PT LONG TERM GOAL #2   Title  Patient will increase BERG balance score to >52/56 to decrease fall risk and improve balance.     Baseline  07/09/18: 47/56 1/7: 52/56     Time  6    Period  Weeks    Status  Achieved       PT LONG TERM GOAL #3   Title  Patient will be able to walk with wife around neighborhood (>38mnutes) to demonstrate improved balance and community ambulation.     Baseline  07/09/18: 566mutes 1/7: wife just returned  Time  6    Period  Weeks    Status  On-going    Target Date  11/27/18      PT LONG TERM GOAL #4   Title  Patient will improve 49mt to >1.057m limiting fall risk and improve community ambulation.     Baseline  07/09/18: 0.83331m 1/7: 1.25 m/s     Time  6    Period  Weeks    Status  Achieved      PT LONG TERM GOAL #5   Title  Patient will increase DGI to >18/24 to indicate increased stability with dynamic mobility and decrease fall risk.     Baseline  1/7: 14/24     Time  6    Period  Weeks    Status  New    Target Date  11/27/18      PT LONG TERM GOAL #6   Title  Pt able to descend 8 steps with single UE assist with no LOB or heel catching on step in order to improve community access and safety on steps.    Baseline  1/7: BUE support and heel catching    Time  6    Period  Weeks    Status  New    Target Date  11/27/18            Plan - 11/06/18 1521    Clinical Impression Statement  Patient most challenged by dynamic balance activities today, most often demonstrated posterior loss of balance with narrow base of support. Patient also exhibited significant difficulty with L foot dorsiflexion in standing, mild improvement with seated exercises. May benefit from further assessment to determine presence of foot drop. (Pt often complains of catching L toe while walking.)     Rehab Potential  Good    Clinical Impairments Affecting Rehab Potential  (+) support system (-) age;     PT Frequency  2x / week    PT Duration  6 weeks    PT Treatment/Interventions  ADLs/Self Care Home Management;Energy conservation;Patient/family education;Therapeutic exercise;Balance training;Neuromuscular re-education;Therapeutic activities;Functional mobility training;Stair training;DME  Instruction;Gait training;Aquatic Therapy;Cryotherapy;Electrical Stimulation;Iontophoresis 4mg65m Dexamethasone;Moist Heat;Traction;Ultrasound;Taping;Passive range of motion;Vestibular;Manual techniques    PT Next Visit Plan  review HEP, strength, balance     PT Home Exercise Plan  standing hip abduction, standing marches, modified tandem stance    Consulted and Agree with Plan of Care  Patient       Patient will benefit from skilled therapeutic intervention in order to improve the following deficits and impairments:  Abnormal gait, Decreased activity tolerance, Decreased balance, Decreased coordination, Decreased endurance, Decreased mobility, Decreased strength, Difficulty walking, Impaired sensation, Pain, Impaired UE functional use, Postural dysfunction, Improper body mechanics, Impaired perceived functional ability, Decreased knowledge of use of DME  Visit Diagnosis: Muscle weakness (generalized)  Gait difficulty  Unsteady gait  Loss of balance  Impairment of balance  Pain in left hip  Pain in right hip     Problem List There are no active problems to display for this patient.  DianLieutenant Diego DPT 3:29 PM,11/06/18 336-[facility]otN RE[facility]AFocus [facility] Surgic[facility] LLCVICES [id number], [place]: [phone]:  [phone]: MaurYAHYE SIEBERT: [id number] of Birth: 7/3/July 02, 1932

## 2018-11-14 ENCOUNTER — Ambulatory Visit: Payer: Medicare Other

## 2018-11-20 ENCOUNTER — Ambulatory Visit: Payer: Medicare Other | Attending: Family Medicine

## 2018-11-20 VITALS — BP 169/67 | HR 62

## 2018-11-20 DIAGNOSIS — R2689 Other abnormalities of gait and mobility: Secondary | ICD-10-CM | POA: Insufficient documentation

## 2018-11-20 DIAGNOSIS — M6281 Muscle weakness (generalized): Secondary | ICD-10-CM | POA: Insufficient documentation

## 2018-11-20 DIAGNOSIS — R269 Unspecified abnormalities of gait and mobility: Secondary | ICD-10-CM | POA: Insufficient documentation

## 2018-11-20 NOTE — Therapy (Signed)
[place] MAIN [facility] SERVICES [address] [place], [place], [address] Phone: [phone]   Fax:  [phone]  Physical Therapy Treatment  Patient Details  Name: Eric Torres MRN: [id number] Date of Birth: 1932/02/26 Referring Provider (PT): Mcneil Sober   Encounter Date: 11/20/2018  PT End of Session - 11/20/18 1519    Visit Number  23    Number of Visits  28    Date for PT Re-Evaluation  11/27/18    Authorization Type  Last goals 10/16/18     PT Start Time  1513    PT Stop Time  1545    PT Time Calculation (min)  32 min    Equipment Utilized During Treatment  Gait belt    Activity Tolerance  Patient tolerated treatment well    Behavior During Therapy  WFL for tasks assessed/performed       Past Medical History:  Diagnosis Date  . BPH (benign prostatic hyperplasia)   . 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 COPD (chronic obstructive pulmonary disease) (HCC)    MILD-NO INHALERS PER PT  . Essential tremor   . Heart murmur   . History of brachytherapy   . History of TIA (transient ischemic attack) 2015   LEFT THUMB AND INDEX FINGER-SOMETIMES I DROP THINGS  . Hypercholesteremia   . Hypertension   . Hypothyroidism     Past Surgical History:  Procedure Laterality Date  . CHOLECYSTECTOMY    . CYSTOSCOPY WITH BIOPSY N/A 12/29/2015   Procedure: CYSTOSCOPY WITH BIOPSY;  Surgeon: Royston Cowper, MD;  Location: [facility] ORS;  Service: Urology;  Laterality: N/A;  . EXPLORATORY LAPAROTOMY  1991   VOLVULUS AND BOWEL OBSTRUCTION  . ILIAC ARTERY STENT Bilateral   . TONSILLECTOMY    . TRANSURETHRAL RESECTION OF BLADDER TUMOR N/A 12/29/2015   Procedure: TRANSURETHRAL RESECTION OF BLADDER TUMOR (TURBT)/MITOMYCIN INSTILLATION;  Surgeon: Royston Cowper, MD;  Location: [facility] ORS;  Service: Urology;  Laterality: N/A;  . TRANSURETHRAL RESECTION OF BLADDER TUMOR WITH GYRUS (TURBT-GYRUS)  2013  . VASECTOMY      Vitals:   11/20/18 1518  BP: (!)  169/67  Pulse: 62  SpO2: 99%    Subjective Assessment - 11/20/18 1520    Subjective  Patient reported that he has not had any falls/stumbles since his last visit. Doing well today except reports feeling very fatigued since he woke up early to take his wife to a pre-op appointment. No pain reported today. He does report one episode of "dizziness" yesterday when he was standing still at the sink and he felt like he was moving. Symptoms resolved once he steadied himself by holding onto the sink.     Pertinent History  Patient reports his main issue is his balance. States he has a new doctor that refered him to this clinic instead of outpatient in [facility] where he has received physical therapy prior. Has received therapy for balance in the past (approximately 1 year ago). States he has fallen twice recently when he tripped over leg lift of recliner (6 weeks ago), and fell carrying groceries while trying to open door (4 weeks ago). Reports he does have a fear of falling while walking. States he has circulation issues in his legs that has returned and has not told doctor. Completes water aerobics 2-3x a week. Patient states he also has shoulder pain and back pain, and that shoulder hurts is when he raises arm in abduction against resistance  and when rolling over in bed. Used to enjoy playing golf. Would like to be able to walk around the block with his wife.     Limitations  Standing;Walking;House hold activities    Currently in Pain?  No/denies          TREATMENT   Neuromuscular Re-education Octane HIIT L6 x 30s, L3 x 60s x 5 minutes for warm-up during history; Dix-Hallpike testing which is negative bilaterally; Gait in hallway with horizontal ball toss to therapist 2 x 75' each direction; Gait in hallway with vertical ball toss to self 2 x 48' with extensive cues for cervical flexion/extension; Seated rest break with HEP review;   Pt educated throughout session about proper posture and  technique with exercises. Improved exercise technique, movement at target joints, use of target muscles after min to mod verbal, visual, tactile cues.    Patient reports one brief duration episode of vertigo so check Dix-Hallpike test which is negative bilaterally. Performed ball tosses in hallway with head turns. Pt requires repeated cues to move his head to follow the ball and not just track with his eyes. Pt with considerable instability and lateral gait deviation. He is very fatigued after ambulation in the hallway and does not appear to have the energy to continue the session. His vitals are normal but pt reports simply feeling very fatigued. Session ended early at the agreement of patient and therapist. Pt encouraged to continue HEP and follow-up as scheduled. Will repeat outcome measures, update goals, and re certify patient at next visit. Pt will benefit from PT services to address deficits in strength, balance, and mobility in order to return to full function at home.                        PT Short Term Goals - 10/16/18 1713      PT SHORT TERM GOAL #1   Title  Patient will be independent with completion of HEP to improve ability to complete functional tasks.    Baseline  HEP compliant    Time  2    Period  Weeks    Status  Achieved      PT SHORT TERM GOAL #2   Title  Patient will report no falls in the next two weeks to demonstrate improved safety and fall risk.     Baseline  07/09/18: fell 2x in 6 weeks 1/7: no falls    Time  2    Period  Weeks    Status  Achieved        PT Long Term Goals - 10/16/18 1550      PT LONG TERM GOAL #1   Title  Patient will improve 5x sit to stand to <10seconds without UE support to demonstrate improvements in functional strength.     Baseline  07/09/18: 13sec hands on knees 1/7: 13 seconds occasional hand on knee    Time  6    Period  Weeks    Status  Partially Met    Target Date  11/27/18      PT LONG TERM GOAL #2   Title   Patient will increase BERG balance score to >52/56 to decrease fall risk and improve balance.     Baseline  07/09/18: 47/56 1/7: 52/56     Time  6    Period  Weeks    Status  Achieved      PT LONG TERM GOAL #3   Title  Patient  will be able to walk with wife around neighborhood (>62mnutes) to demonstrate improved balance and community ambulation.     Baseline  07/09/18: 522mutes 1/7: wife just returned     Time  6    Period  Weeks    Status  On-going    Target Date  11/27/18      PT LONG TERM GOAL #4   Title  Patient will improve 1056mto >1.6m/75mimiting fall risk and improve community ambulation.     Baseline  07/09/18: 0.833m/47m/7: 1.25 m/s     Time  6    Period  Weeks    Status  Achieved      PT LONG TERM GOAL #5   Title  Patient will increase DGI to >18/24 to indicate increased stability with dynamic mobility and decrease fall risk.     Baseline  1/7: 14/24     Time  6    Period  Weeks    Status  New    Target Date  11/27/18      PT LONG TERM GOAL #6   Title  Pt able to descend 8 steps with single UE assist with no LOB or heel catching on step in order to improve community access and safety on steps.    Baseline  1/7: BUE support and heel catching    Time  6    Period  Weeks    Status  New    Target Date  11/27/18            Plan - 11/20/18 1521    Clinical Impression Statement  Patient reports one brief duration episode of vertigo so check Dix-Hallpike test which is negative bilaterally. Performed ball tosses in hallway with head turns. Pt requires repeated cues to move his head to follow the ball and not just track with his eyes. Pt with considerable instability and lateral gait deviation. He is very fatigued after ambulation in the hallway and does not appear to have the energy to continue the session. His vitals are normal but pt reports simply feeling very fatigued. Session ended early at the agreement of patient and therapist. Pt encouraged to continue HEP and  follow-up as scheduled. Will repeat outcome measures, update goals, and re certify patient at next visit. Pt will benefit from PT services to address deficits in strength, balance, and mobility in order to return to full function at home.     Rehab Potential  Good    Clinical Impairments Affecting Rehab Potential  (+) support system (-) age;     PT Frequency  2x / week    PT Duration  6 weeks    PT Treatment/Interventions  ADLs/Self Care Home Management;Energy conservation;Patient/family education;Therapeutic exercise;Balance training;Neuromuscular re-education;Therapeutic activities;Functional mobility training;Stair training;DME Instruction;Gait training;Aquatic Therapy;Cryotherapy;Electrical Stimulation;Iontophoresis 4mg/m24mexamethasone;Moist Heat;Traction;Ultrasound;Taping;Passive range of motion;Vestibular;Manual techniques    PT Next Visit Plan  Repeat outcome measures/goals, recertification, review HEP, strength, balance     PT Home Exercise Plan  standing hip abduction, standing marches, modified tandem stance    Consulted and Agree with Plan of Care  Patient       Patient will benefit from skilled therapeutic intervention in order to improve the following deficits and impairments:  Abnormal gait, Decreased activity tolerance, Decreased balance, Decreased coordination, Decreased endurance, Decreased mobility, Decreased strength, Difficulty walking, Impaired sensation, Pain, Impaired UE functional use, Postural dysfunction, Improper body mechanics, Impaired perceived functional ability, Decreased knowledge of use of DME  Visit Diagnosis: Muscle weakness (generalized)  Gait difficulty     Problem List There are no active problems to display for this patient.   Eric Torres 11/21/2018, 10:45 AM  [place] MAIN [facility] SERVICES [address] [place], [place], [address] Phone: [phone]   Fax:  [phone]  Name: Eric Torres MRN:  [id number] Date of Birth: 1931/12/28

## 2018-11-27 ENCOUNTER — Ambulatory Visit: Payer: Medicare Other

## 2018-11-27 DIAGNOSIS — M6281 Muscle weakness (generalized): Secondary | ICD-10-CM

## 2018-11-27 DIAGNOSIS — R269 Unspecified abnormalities of gait and mobility: Secondary | ICD-10-CM

## 2018-11-27 NOTE — Patient Instructions (Signed)
Access Code: [email or web address]  URL: [email or web address]  Date: 11/27/2018  Prepared by: Roxana Hires   Exercises  Standing Romberg to 1/2 Tandem Stance - 3 reps - 30 sec x 3 for each leg forward hold - 1x daily - 7x weekly  Tandem Stance with Head Rotation - 3 reps - 30 sec x 3 for each leg forward hold - 1x daily - 7x weekly  Single Leg Stance - 3 reps - 30 seconds x 3 for each leg hold - 1x daily - 7x weekly  Heel Toe Raises with Counter Support as Needed - 10 reps - 2 sets - 3 seconds hold - 1x daily - 7x weekly

## 2018-11-27 NOTE — Therapy (Signed)
[place] MAIN [facility] SERVICES [address] [place], [place], [address] Phone: [phone]   Fax:  [phone]  Physical Therapy Treatment/Recertification  Patient Details  Name: Eric Torres MRN: [id number] Date of Birth: 22-Feb-1932 Referring Provider (PT): Mcneil Sober   Encounter Date: 11/27/2018  PT End of Session - 11/27/18 1438    Visit Number  24    Number of Visits  44    Date for PT Re-Evaluation  01/22/19    Authorization Type  Last goals 10/16/18, updated 11/27/18    PT Start Time  1435    PT Stop Time  1515    PT Time Calculation (min)  40 min    Equipment Utilized During Treatment  Gait belt    Activity Tolerance  Patient tolerated treatment well    Behavior During Therapy  WFL for tasks assessed/performed       Past Medical History:  Diagnosis Date  . BPH (benign prostatic hyperplasia)   . 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 COPD (chronic obstructive pulmonary disease) (HCC)    MILD-NO INHALERS PER PT  . Essential tremor   . Heart murmur   . History of brachytherapy   . History of TIA (transient ischemic attack) 2015   LEFT THUMB AND INDEX FINGER-SOMETIMES I DROP THINGS  . Hypercholesteremia   . Hypertension   . Hypothyroidism     Past Surgical History:  Procedure Laterality Date  . CHOLECYSTECTOMY    . CYSTOSCOPY WITH BIOPSY N/A 12/29/2015   Procedure: CYSTOSCOPY WITH BIOPSY;  Surgeon: Royston Cowper, MD;  Location: [facility] ORS;  Service: Urology;  Laterality: N/A;  . EXPLORATORY LAPAROTOMY  1991   VOLVULUS AND BOWEL OBSTRUCTION  . ILIAC ARTERY STENT Bilateral   . TONSILLECTOMY    . TRANSURETHRAL RESECTION OF BLADDER TUMOR N/A 12/29/2015   Procedure: TRANSURETHRAL RESECTION OF BLADDER TUMOR (TURBT)/MITOMYCIN INSTILLATION;  Surgeon: Royston Cowper, MD;  Location: [facility] ORS;  Service: Urology;  Laterality: N/A;  . TRANSURETHRAL RESECTION OF BLADDER TUMOR WITH GYRUS (TURBT-GYRUS)  2013  . VASECTOMY      There  were no vitals filed for this visit.  Subjective Assessment - 11/27/18 1438    Subjective  Patient reported that he fell on Saturday while trying to put something on a low shelf in his refrigerator. He denies any pain upon arrival. He reports approximately 30% improvement since starting therapy. He is not consistent with his HEP and is only performing approximately 3x/wk.    Pertinent History  Patient reports his main issue is his balance. States he has a new doctor that refered him to this clinic instead of outpatient in [facility] where he has received physical therapy prior. Has received therapy for balance in the past (approximately 1 year ago). States he has fallen twice recently when he tripped over leg lift of recliner (6 weeks ago), and fell carrying groceries while trying to open door (4 weeks ago). Reports he does have a fear of falling while walking. States he has circulation issues in his legs that has returned and has not told doctor. Completes water aerobics 2-3x a week. Patient states he also has shoulder pain and back pain, and that shoulder hurts is when he raises arm in abduction against resistance and when rolling over in bed. Used to enjoy playing golf. Would like to be able to walk around the block with his wife.     Limitations  Standing;Walking;House hold activities  Currently in Pain?  No/denies         [facility] PT Assessment - 11/27/18 1453      Standardized Balance Assessment   Standardized Balance Assessment  Berg Balance Test;Dynamic Gait Index;Five Times Sit to Stand;10 meter walk test      [employer]   Sit to Stand  Able to stand without using hands and stabilize independently    Standing Unsupported  Able to stand safely 2 minutes    Sitting with Back Unsupported but Feet Supported on Floor or Stool  Able to sit safely and securely 2 minutes    Stand to Sit  Sits safely with minimal use of hands    Transfers  Able to transfer safely, minor use of hands     Standing Unsupported with Eyes Closed  Able to stand 10 seconds safely    Standing Ubsupported with Feet Together  Able to place feet together independently and stand 1 minute safely    From Standing, Reach Forward with Outstretched Arm  Can reach confidently >25 cm (10")    From Standing Position, Pick up Object from Floor  Able to pick up shoe safely and easily    From Standing Position, Turn to Look Behind Over each Shoulder  Looks behind from both sides and weight shifts well    Turn 360 Degrees  Able to turn 360 degrees safely one side only in 4 seconds or less    Standing Unsupported, Alternately Place Feet on Step/Stool  Able to stand independently and safely and complete 8 steps in 20 seconds    Standing Unsupported, One Foot in Front  Able to plae foot ahead of the other independently and hold 30 seconds    Standing on One Leg  Able to lift leg independently and hold equal to or more than 3 seconds    Total Score  52      Dynamic Gait Index   Level Surface  Mild Impairment    Change in Gait Speed  Normal    Gait with Horizontal Head Turns  Mild Impairment    Gait with Vertical Head Turns  Mild Impairment    Gait and Pivot Turn  Mild Impairment    Step Over Obstacle  Mild Impairment    Step Around Obstacles  Mild Impairment    Steps  Mild Impairment    Total Score  17             TREATMENT   Neuromuscular Re-education Updated outcome measures and goals with patient including: 5TSTS: 12.3s no UE support but occasional posterior leaning,  BERG: 52/56 9mgait speed: 11/27/18: 7.9s=1.27 m/s DGI: 17/24 Updated and performed HEP with patient including: Semitandem balance alternating forward LE x 30s each; Semitandem balance alternating forward LE with horizontal head turns x 30s each; Single leg balance x 30s total each; Heel/toe raises without UE support x 10 each direction;   Pt educated throughout session about proper posture and technique with exercises.  Improved exercise technique, movement at target joints, use of target muscles after min to mod verbal, visual, tactile cues.    Updated outcome measures and goals with patient including 5TSTS, BERG, 128mait speed, and DGI. His 5TSTS is slightly improved but most notably he is able to perform without UE support. He does have some occasional posterior leaning. His BERG is unchanged from the last time it was tested. His DGI improved from 14/24 to 17/24. He reports that he still occasionally catches his heels  when descending steps. Overall pt reports he feels approximately 30% improved since starting therapy. He is not consistent with his HEP and this was reinforced with patient. Additional balance exercises added to HEP and pt informed that he needs to complete at least once/daily. Pt will benefit from PT services to address deficits in strength, balance, and mobility in order to return to full function at home.                  PT Short Term Goals - 11/27/18 1438      PT SHORT TERM GOAL #1   Title  Patient will be independent with completion of HEP to improve ability to complete functional tasks.    Baseline  HEP compliant; 11/27/18: Pt only performing 3d/wk    Time  2    Period  Weeks    Status  On-going    Target Date  12/25/18      PT SHORT TERM GOAL #2   Title  Patient will report no falls in the next two weeks to demonstrate improved safety and fall risk. 11/27/18: Pt fell Saturday morning while he was putting something in the bottom shelf of refrigerator    Baseline  07/09/18: fell 2x in 6 weeks 1/7: no falls; 11/27/18: Pt fell 3 days prior to treatment    Time  2    Period  Weeks    Status  On-going    Target Date  12/25/18        PT Long Term Goals - 11/27/18 1440      PT LONG TERM GOAL #1   Title  Patient will improve 5x sit to stand to <10seconds without UE support to demonstrate improvements in functional strength.     Baseline  07/09/18: 13sec hands on knees 1/7:  13 seconds occasional hand on knee; 11/27/18: 12.3s no UE support, occasional posterior leaning    Time  8    Period  Weeks    Status  Partially Met    Target Date  01/22/19      PT LONG TERM GOAL #2   Title  Patient will increase BERG balance score to >52/56 to decrease fall risk and improve balance.     Baseline  07/09/18: 47/56 1/7: 52/56; 11/27/18: 52/56    Time  8    Period  Weeks    Status  Partially Met    Target Date  01/22/19      PT LONG TERM GOAL #3   Title  Patient will be able to walk with wife around neighborhood (>72mnutes) to demonstrate improved balance and community ambulation.     Baseline  07/09/18: 579mutes 1/7: wife just returned; 11/27/18: has not attempted    Time  6    Period  Weeks    Status  On-going    Target Date  01/22/19      PT LONG TERM GOAL #4   Title  Patient will improve 1083mto >1.67m/2mimiting fall risk and improve community ambulation.     Baseline  07/09/18: 0.833m/85m/7: 1.25 m/s; 11/27/18: 7.9s=1.27 m/s    Time  6    Period  Weeks    Status  Achieved      PT LONG TERM GOAL #5   Title  Patient will increase DGI to >18/24 to indicate increased stability with dynamic mobility and decrease fall risk.     Baseline  1/7: 14/24; 11/27/18: 17/24    Time  6    Period  Weeks    Status  On-going    Target Date  01/22/19      PT LONG TERM GOAL #6   Title  Pt able to descend 8 steps with single UE assist with no LOB or heel catching on step in order to improve community access and safety on steps.    Baseline  1/7: BUE support and heel catching; 11/27/18: Heel still catching    Time  8    Period  Weeks    Status  New    Target Date  01/22/19            Plan - 11/27/18 1438    Clinical Impression Statement  Updated outcome measures and goals with patient including 5TSTS, BERG, 59mgait speed, and DGI. His 5TSTS is slightly improved but most notably he is able to perform without UE support. He does have some occasional posterior leaning. His  BERG is unchanged from the last time it was tested. His DGI improved from 14/24 to 17/24. He reports that he still occasionally catches his heels when descending steps. Overall pt reports he feels approximately 30% improved since starting therapy. He is not consistent with his HEP and this was reinforced with patient. Additional balance exercises added to HEP and pt informed that he needs to complete at least once/daily. Pt will benefit from PT services to address deficits in strength, balance, and mobility in order to return to full function at home.     Rehab Potential  Good    Clinical Impairments Affecting Rehab Potential  (+) support system (-) age;     PT Frequency  2x / week    PT Duration  8 weeks    PT Treatment/Interventions  ADLs/Self Care Home Management;Energy conservation;Patient/family education;Therapeutic exercise;Balance training;Neuromuscular re-education;Therapeutic activities;Functional mobility training;Stair training;DME Instruction;Gait training;Aquatic Therapy;Cryotherapy;Electrical Stimulation;Iontophoresis 476mml Dexamethasone;Moist Heat;Traction;Ultrasound;Taping;Passive range of motion;Vestibular;Manual techniques    PT Next Visit Plan  [place] HEP, strength, balance     PT Home Exercise Plan  Semitandem balance, semitandem balance with horizontal head turns, single leg balance, heel/toe raises ([id number]   Consulted and Agree with Plan of Care  Patient       Patient will benefit from skilled therapeutic intervention in order to improve the following deficits and impairments:  Abnormal gait, Decreased activity tolerance, Decreased balance, Decreased coordination, Decreased endurance, Decreased mobility, Decreased strength, Difficulty walking, Impaired sensation, Pain, Impaired UE functional use, Postural dysfunction, Improper body mechanics, Impaired perceived functional ability, Decreased knowledge of use of DME  Visit Diagnosis: Muscle weakness (generalized)  Gait  difficulty     Problem List There are no active problems to display for this patient.  JaPhillips GroutT, DPT, GCS  Daven Pinckney 11/28/2018, 1:33 PM  [place] [facility] [address]hone: [phone] Fax:  [phone]: MaKHAMAURI BAUERNFEINDRN: [id number] of Birth: 7/December 07, 1931

## 2018-11-29 ENCOUNTER — Ambulatory Visit: Payer: Medicare Other

## 2018-11-29 VITALS — BP 169/68 | HR 55

## 2018-11-29 DIAGNOSIS — R269 Unspecified abnormalities of gait and mobility: Secondary | ICD-10-CM

## 2018-11-29 DIAGNOSIS — M6281 Muscle weakness (generalized): Secondary | ICD-10-CM

## 2018-11-29 NOTE — Therapy (Addendum)
[place] MAIN [facility] SERVICES [address] [place], [place], [address] Phone: [phone]   Fax:  [phone]  Physical Therapy Treatment  Patient Details  Name: Eric Torres MRN: [id number] Date of Birth: 1932-05-11 Referring Provider (PT): Mcneil Sober   Encounter Date: 11/29/2018  PT End of Session - 12/02/18 2016    Visit Number  25    Number of Visits  44    Date for PT Re-Evaluation  01/22/19    Authorization Type  Last goals 11/27/18    PT Start Time  1520    PT Stop Time  1605    PT Time Calculation (min)  45 min    Equipment Utilized During Treatment  Gait belt    Activity Tolerance  Patient tolerated treatment well    Behavior During Therapy  [facility] for tasks assessed/performed       Past Medical History:  Diagnosis Date  . BPH (benign prostatic hyperplasia)   . 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 COPD (chronic obstructive pulmonary disease) (HCC)    MILD-NO INHALERS PER PT  . Essential tremor   . Heart murmur   . History of brachytherapy   . History of TIA (transient ischemic attack) 2015   LEFT THUMB AND INDEX FINGER-SOMETIMES I DROP THINGS  . Hypercholesteremia   . Hypertension   . Hypothyroidism     Past Surgical History:  Procedure Laterality Date  . CHOLECYSTECTOMY    . CYSTOSCOPY WITH BIOPSY N/A 12/29/2015   Procedure: CYSTOSCOPY WITH BIOPSY;  Surgeon: Royston Cowper, MD;  Location: [facility] ORS;  Service: Urology;  Laterality: N/A;  . EXPLORATORY LAPAROTOMY  1991   VOLVULUS AND BOWEL OBSTRUCTION  . ILIAC ARTERY STENT Bilateral   . TONSILLECTOMY    . TRANSURETHRAL RESECTION OF BLADDER TUMOR N/A 12/29/2015   Procedure: TRANSURETHRAL RESECTION OF BLADDER TUMOR (TURBT)/MITOMYCIN INSTILLATION;  Surgeon: Royston Cowper, MD;  Location: [facility] ORS;  Service: Urology;  Laterality: N/A;  . TRANSURETHRAL RESECTION OF BLADDER TUMOR WITH GYRUS (TURBT-GYRUS)  2013  . VASECTOMY      Vitals:   11/29/18 1521  BP: (!)  169/68  Pulse: (!) 55  SpO2: 96%    Subjective Assessment - 12/02/18 2015    Subjective  Pt states that he is doing well today. He performed additional HEP yesterday and plans to do them again tonight. He denies any pain upon arrival. No specific questions or concerns currently.     Pertinent History  Patient reports his main issue is his balance. States he has a new doctor that refered him to this clinic instead of outpatient in [facility] where he has received physical therapy prior. Has received therapy for balance in the past (approximately 1 year ago). States he has fallen twice recently when he tripped over leg lift of recliner (6 weeks ago), and fell carrying groceries while trying to open door (4 weeks ago). Reports he does have a fear of falling while walking. States he has circulation issues in his legs that has returned and has not told doctor. Completes water aerobics 2-3x a week. Patient states he also has shoulder pain and back pain, and that shoulder hurts is when he raises arm in abduction against resistance and when rolling over in bed. Used to enjoy playing golf. Would like to be able to walk around the block with his wife.     Limitations  Standing;Walking;House hold activities    How long can you  stand comfortably?  5 minutes    How long can you walk comfortably?  5 minutes     Currently in Pain?  No/denies           TREATMENT   Ther-ex  Octane HIIT L6 x 30s, L3 x 60s x 5 minutes for warm-up (unbilled); Quantum leg press 135# x 30, 150# x 20, 165# x 20; Hip flexion marches without UE support 2.5# ankle weights (AW) x 15 bilateral; Heel/toe raises without UE support x 15 Seated L toe raises x10 due to weakness, given in HEP ; Hip abduction with UE support 2.5# AW 2  x10 bilateral; Standing HS curls 2.5# AW x 20 bilateral; Step over hurdles fwd back x15 ea with [id number] Standing mini squats x 10 with single UE support; cues for proper squat technique   Neuromuscular  Re-education Gait in hallway withhorizontal balltoss to therapist2 x 75' to each side; Gait in hallway with vertical ball toss to self 2 x 71' with extensive cues for cervical flexion/extension; Forward ambulation in hallway with ball pass over shoulder at varying heights between waist and overhead with return pass over opposite shoulder 75' x 2. Pt requires repeated cues to turn head and follow ball with eyes during passes;    Pt educated throughout session about proper posture and technique with exercises. Improved exercise technique, movement at target joints, use of target muscles after min to mod verbal, visual, tactile cues.    Pt demonstrates good motivation during session. He demonstrates significant lateral gait deviation when performing head turns during ball tosses. He requires one min/modA+1 assist from therapist to prevent him from falling. He is able to complete all exercises as instructed during session. Pt encouraged to continue HEP and follow-up as scheduled. He will benefit from PT services to address deficits in strength, balance, and mobility in order to return to full function at home.                       PT Short Term Goals - 11/27/18 1438      PT SHORT TERM GOAL #1   Title  Patient will be independent with completion of HEP to improve ability to complete functional tasks.    Baseline  HEP compliant; 11/27/18: Pt only performing 3d/wk    Time  2    Period  Weeks    Status  On-going    Target Date  12/25/18      PT SHORT TERM GOAL #2   Title  Patient will report no falls in the next two weeks to demonstrate improved safety and fall risk. 11/27/18: Pt fell Saturday morning while he was putting something in the bottom shelf of refrigerator    Baseline  07/09/18: fell 2x in 6 weeks 1/7: no falls; 11/27/18: Pt fell 3 days prior to treatment    Time  2    Period  Weeks    Status  On-going    Target Date  12/25/18        PT Long Term Goals -  11/27/18 1440      PT LONG TERM GOAL #1   Title  Patient will improve 5x sit to stand to <10seconds without UE support to demonstrate improvements in functional strength.     Baseline  07/09/18: 13sec hands on knees 1/7: 13 seconds occasional hand on knee; 11/27/18: 12.3s no UE support, occasional posterior leaning    Time  8    Period  Weeks  Status  Partially Met    Target Date  01/22/19      PT LONG TERM GOAL #2   Title  Patient will increase BERG balance score to >52/56 to decrease fall risk and improve balance.     Baseline  07/09/18: 47/56 1/7: 52/56; 11/27/18: 52/56    Time  8    Period  Weeks    Status  Partially Met    Target Date  01/22/19      PT LONG TERM GOAL #3   Title  Patient will be able to walk with wife around neighborhood (>3mnutes) to demonstrate improved balance and community ambulation.     Baseline  07/09/18: 564mutes 1/7: wife just returned; 11/27/18: has not attempted    Time  6    Period  Weeks    Status  On-going    Target Date  01/22/19      PT LONG TERM GOAL #4   Title  Patient will improve 106mto >1.86m/34mimiting fall risk and improve community ambulation.     Baseline  07/09/18: 0.833m/71m/7: 1.25 m/s; 11/27/18: 7.9s=1.27 m/s    Time  6    Period  Weeks    Status  Achieved      PT LONG TERM GOAL #5   Title  Patient will increase DGI to >18/24 to indicate increased stability with dynamic mobility and decrease fall risk.     Baseline  1/7: 14/24; 11/27/18: 17/24    Time  6    Period  Weeks    Status  On-going    Target Date  01/22/19      PT LONG TERM GOAL #6   Title  Pt able to descend 8 steps with single UE assist with no LOB or heel catching on step in order to improve community access and safety on steps.    Baseline  1/7: BUE support and heel catching; 11/27/18: Heel still catching    Time  8    Period  Weeks    Status  New    Target Date  01/22/19            Plan - 12/02/18 2016    Clinical Impression Statement  Pt demonstrates  good motivation during session. He demonstrates significant lateral gait deviation when performing head turns during ball tosses. He requires one min/modA+1 assist from therapist to prevent him from falling. He is able to complete all exercises as instructed during session. Pt encouraged to continue HEP and follow-up as scheduled. He will benefit from PT services to address deficits in strength, balance, and mobility in order to return to full function at home.     Rehab Potential  Good    Clinical Impairments Affecting Rehab Potential  (+) support system (-) age;     PT Frequency  2x / week    PT Duration  8 weeks    PT Treatment/Interventions  ADLs/Self Care Home Management;Energy conservation;Patient/family education;Therapeutic exercise;Balance training;Neuromuscular re-education;Therapeutic activities;Functional mobility training;Stair training;DME Instruction;Gait training;Aquatic Therapy;Cryotherapy;Electrical Stimulation;Iontophoresis 4mg/m59mexamethasone;Moist Heat;Traction;Ultrasound;Taping;Passive range of motion;Vestibular;Manual techniques    PT Next Visit Plan  Review HEP, strength, balance     PT Home Exercise Plan  Semitandem balance, semitandem balance with horizontal head turns, single leg balance, heel/toe raises ([id number] and Agree with Plan of Care  Patient       Patient will benefit from skilled therapeutic intervention in order to improve the following deficits and impairments:  Abnormal gait, Decreased activity tolerance,  Decreased balance, Decreased coordination, Decreased endurance, Decreased mobility, Decreased strength, Difficulty walking, Impaired sensation, Pain, Impaired UE functional use, Postural dysfunction, Improper body mechanics, Impaired perceived functional ability, Decreased knowledge of use of DME  Visit Diagnosis: Muscle weakness (generalized)  Gait difficulty     Problem List There are no active problems to display for this  patient.  Phillips Grout PT, DPT, GCS  Huprich,Jason 12/02/2018, 8:21 PM  [place] MAIN [facility] SERVICES [address] [place], [place], [address] Phone: [phone]   Fax:  [phone]  Name: Eric Torres MRN: [id number] Date of Birth: Sep 01, 1932

## 2018-12-04 ENCOUNTER — Ambulatory Visit: Payer: Medicare Other

## 2018-12-04 VITALS — BP 136/67 | HR 52

## 2018-12-04 DIAGNOSIS — M6281 Muscle weakness (generalized): Secondary | ICD-10-CM

## 2018-12-04 DIAGNOSIS — R269 Unspecified abnormalities of gait and mobility: Secondary | ICD-10-CM

## 2018-12-04 NOTE — Therapy (Signed)
[place] MAIN [facility] SERVICES [address] [place], [place], [address] Phone: [phone]   Fax:  [phone]  Physical Therapy Treatment  Patient Details  Name: Eric Torres MRN: [id number] Date of Birth: May 25, 1932 Referring Provider (PT): Mcneil Sober   Encounter Date: 12/04/2018  PT End of Session - 12/04/18 1436    Visit Number  26    Number of Visits  44    Date for PT Re-Evaluation  01/22/19    Authorization Type  Last goals 11/27/18    PT Start Time  1433    PT Stop Time  1515    PT Time Calculation (min)  42 min    Equipment Utilized During Treatment  Gait belt    Activity Tolerance  Patient tolerated treatment well    Behavior During Therapy  [facility] for tasks assessed/performed       Past Medical History:  Diagnosis Date  . BPH (benign prostatic hyperplasia)   . 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 COPD (chronic obstructive pulmonary disease) (HCC)    MILD-NO INHALERS PER PT  . Essential tremor   . Heart murmur   . History of brachytherapy   . History of TIA (transient ischemic attack) 2015   LEFT THUMB AND INDEX FINGER-SOMETIMES I DROP THINGS  . Hypercholesteremia   . Hypertension   . Hypothyroidism     Past Surgical History:  Procedure Laterality Date  . CHOLECYSTECTOMY    . CYSTOSCOPY WITH BIOPSY N/A 12/29/2015   Procedure: CYSTOSCOPY WITH BIOPSY;  Surgeon: Royston Cowper, MD;  Location: [facility] ORS;  Service: Urology;  Laterality: N/A;  . EXPLORATORY LAPAROTOMY  1991   VOLVULUS AND BOWEL OBSTRUCTION  . ILIAC ARTERY STENT Bilateral   . TONSILLECTOMY    . TRANSURETHRAL RESECTION OF BLADDER TUMOR N/A 12/29/2015   Procedure: TRANSURETHRAL RESECTION OF BLADDER TUMOR (TURBT)/MITOMYCIN INSTILLATION;  Surgeon: Royston Cowper, MD;  Location: [facility] ORS;  Service: Urology;  Laterality: N/A;  . TRANSURETHRAL RESECTION OF BLADDER TUMOR WITH GYRUS (TURBT-GYRUS)  2013  . VASECTOMY      Vitals:   12/04/18 1437  BP: 136/67   Pulse: (!) 52  SpO2: 98%    Subjective Assessment - 12/04/18 1436    Subjective  Pt states that he is doing well today. Performing HEP consistently at home. He denies any pain upon arrival. No specific questions or concerns currently.     Pertinent History  Patient reports his main issue is his balance. States he has a new doctor that refered him to this clinic instead of outpatient in [facility] where he has received physical therapy prior. Has received therapy for balance in the past (approximately 1 year ago). States he has fallen twice recently when he tripped over leg lift of recliner (6 weeks ago), and fell carrying groceries while trying to open door (4 weeks ago). Reports he does have a fear of falling while walking. States he has circulation issues in his legs that has returned and has not told doctor. Completes water aerobics 2-3x a week. Patient states he also has shoulder pain and back pain, and that shoulder hurts is when he raises arm in abduction against resistance and when rolling over in bed. Used to enjoy playing golf. Would like to be able to walk around the block with his wife.     Limitations  Standing;Walking;House hold activities    How long can you stand comfortably?  5 minutes  How long can you walk comfortably?  5 minutes     Currently in Pain?  No/denies         TREATMENT   Ther-ex  Octane HIIT L8 x 30s, L4 x 60s x 5 minutes for warm-up (unbilled); Quantum leg press 155# x 20, 170# x 20, 185# x 14, pt fatigues at 14 reps during final set; Standing mini squats without UE support x 10; Sit to stand from regular height chair without UE support as fast as possible for 30s, pt complete 11 repetitions; Split lunges alternating forward LE x 10 each with faded UE support;   Neuromuscular Re-education Gait in hallway withhorizontalballtossto therapistx 21' to each side; Gait in hallway with vertical ball toss to self x 77' with extensive cues for cervical  flexion/extension; Forward ambulation in hallway with ball pass over shoulder at varying heights between waist and overhead with return pass over opposite shoulder 75' x 2. Pt requires repeated cues to turn head and follow ball with eyes during passes; Semitandem balance with horizontal head turns alternating forward LE x 30s each; Semitandem balance with horizontal body turns alternating forward LE x 30s each; Semitandem balance with vertical head turns alternating forward LE x 30s each;    Pt educated throughout session about proper posture and technique with exercises. Improved exercise technique, movement at target joints, use of target muscles after min to mod verbal, visual, tactile cues.   Pt demonstrates good motivation during session. He demonstrates significant lateral gait deviation when performing head turns during ball tosses and has two episodes where he requires mod/maxA from therapist to prevent him from falling. He is able to complete all exercises as instructed during session and is able to increase the resistance on the leg press. Pt encouraged to continue HEP and follow-up as scheduled.He will benefit from PT services to address deficits in strength, balance, and mobility in order to return to full function at home.                        PT Short Term Goals - 11/27/18 1438      PT SHORT TERM GOAL #1   Title  Patient will be independent with completion of HEP to improve ability to complete functional tasks.    Baseline  HEP compliant; 11/27/18: Pt only performing 3d/wk    Time  2    Period  Weeks    Status  On-going    Target Date  12/25/18      PT SHORT TERM GOAL #2   Title  Patient will report no falls in the next two weeks to demonstrate improved safety and fall risk. 11/27/18: Pt fell Saturday morning while he was putting something in the bottom shelf of refrigerator    Baseline  07/09/18: fell 2x in 6 weeks 1/7: no falls; 11/27/18: Pt fell 3  days prior to treatment    Time  2    Period  Weeks    Status  On-going    Target Date  12/25/18        PT Long Term Goals - 11/27/18 1440      PT LONG TERM GOAL #1   Title  Patient will improve 5x sit to stand to <10seconds without UE support to demonstrate improvements in functional strength.     Baseline  07/09/18: 13sec hands on knees 1/7: 13 seconds occasional hand on knee; 11/27/18: 12.3s no UE support, occasional posterior leaning    Time  8    Period  Weeks    Status  Partially Met    Target Date  01/22/19      PT LONG TERM GOAL #2   Title  Patient will increase BERG balance score to >52/56 to decrease fall risk and improve balance.     Baseline  07/09/18: 47/56 1/7: 52/56; 11/27/18: 52/56    Time  8    Period  Weeks    Status  Partially Met    Target Date  01/22/19      PT LONG TERM GOAL #3   Title  Patient will be able to walk with wife around neighborhood (>77mnutes) to demonstrate improved balance and community ambulation.     Baseline  07/09/18: 527mutes 1/7: wife just returned; 11/27/18: has not attempted    Time  6    Period  Weeks    Status  On-going    Target Date  01/22/19      PT LONG TERM GOAL #4   Title  Patient will improve 1055mto >1.73m/57mimiting fall risk and improve community ambulation.     Baseline  07/09/18: 0.833m/27m/7: 1.25 m/s; 11/27/18: 7.9s=1.27 m/s    Time  6    Period  Weeks    Status  Achieved      PT LONG TERM GOAL #5   Title  Patient will increase DGI to >18/24 to indicate increased stability with dynamic mobility and decrease fall risk.     Baseline  1/7: 14/24; 11/27/18: 17/24    Time  6    Period  Weeks    Status  On-going    Target Date  01/22/19      PT LONG TERM GOAL #6   Title  Pt able to descend 8 steps with single UE assist with no LOB or heel catching on step in order to improve community access and safety on steps.    Baseline  1/7: BUE support and heel catching; 11/27/18: Heel still catching    Time  8    Period  Weeks     Status  New    Target Date  01/22/19            Plan - 12/04/18 1437    Clinical Impression Statement  Pt demonstrates good motivation during session. He demonstrates significant lateral gait deviation when performing head turns during ball tosses and has two episodes where he requires mod/maxA from therapist to prevent him from falling. He is able to complete all exercises as instructed during session and is able to increase the resistance on the leg press. Pt encouraged to continue HEP and follow-up as scheduled.He will benefit from PT services to address deficits in strength, balance, and mobility in order to return to full function at home.    Rehab Potential  Good    Clinical Impairments Affecting Rehab Potential  (+) support system (-) age;     PT Frequency  2x / week    PT Duration  8 weeks    PT Treatment/Interventions  ADLs/Self Care Home Management;Energy conservation;Patient/family education;Therapeutic exercise;Balance training;Neuromuscular re-education;Therapeutic activities;Functional mobility training;Stair training;DME Instruction;Gait training;Aquatic Therapy;Cryotherapy;Electrical Stimulation;Iontophoresis 4mg/m19mexamethasone;Moist Heat;Traction;Ultrasound;Taping;Passive range of motion;Vestibular;Manual techniques    PT Next Visit Plan  Review HEP, strength, balance     PT Home Exercise Plan  Semitandem balance, semitandem balance with horizontal head turns, single leg balance, heel/toe raises ([id number] and Agree with Plan of Care  Patient  Patient will benefit from skilled therapeutic intervention in order to improve the following deficits and impairments:  Abnormal gait, Decreased activity tolerance, Decreased balance, Decreased coordination, Decreased endurance, Decreased mobility, Decreased strength, Difficulty walking, Impaired sensation, Pain, Impaired UE functional use, Postural dysfunction, Improper body mechanics, Impaired perceived  functional ability, Decreased knowledge of use of DME  Visit Diagnosis: Muscle weakness (generalized)  Gait difficulty     Problem List There are no active problems to display for this patient.  Phillips Grout PT, DPT, GCS  Huprich,Jason 12/05/2018, 1:30 PM  [place] MAIN [facility] SERVICES [address] [place], [place], [address] Phone: [phone]   Fax:  [phone]  Name: ZAVIYAR RAHAL MRN: [id number] Date of Birth: 04/05/1932

## 2018-12-06 ENCOUNTER — Ambulatory Visit: Payer: Medicare Other

## 2018-12-06 DIAGNOSIS — M6281 Muscle weakness (generalized): Secondary | ICD-10-CM

## 2018-12-06 DIAGNOSIS — R2689 Other abnormalities of gait and mobility: Secondary | ICD-10-CM

## 2018-12-06 DIAGNOSIS — R269 Unspecified abnormalities of gait and mobility: Secondary | ICD-10-CM

## 2018-12-06 NOTE — Therapy (Signed)
[place] MAIN [facility] SERVICES [address] [place], [place], [address] Phone: [phone]   Fax:  [phone]  Physical Therapy Treatment  Patient Details  Name: Eric Torres MRN: [id number] Date of Birth: [birth year] Referring Provider (PT): Mcneil Sober   Encounter Date: 12/06/2018  PT End of Session - 12/06/18 1530    Visit Number  27    Number of Visits  44    Date for PT Re-Evaluation  01/22/19    Authorization Type  Last goals 11/27/18    PT Start Time  1516    PT Stop Time  1600    PT Time Calculation (min)  44 min    Equipment Utilized During Treatment  Gait belt    Activity Tolerance  Patient tolerated treatment well    Behavior During Therapy  [facility] for tasks assessed/performed       Past Medical History:  Diagnosis Date  . BPH (benign prostatic hyperplasia)   . 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 COPD (chronic obstructive pulmonary disease) (HCC)    MILD-NO INHALERS PER PT  . Essential tremor   . Heart murmur   . History of brachytherapy   . History of TIA (transient ischemic attack) 2015   LEFT THUMB AND INDEX FINGER-SOMETIMES I DROP THINGS  . Hypercholesteremia   . Hypertension   . Hypothyroidism     Past Surgical History:  Procedure Laterality Date  . CHOLECYSTECTOMY    . CYSTOSCOPY WITH BIOPSY N/A 12/29/2015   Procedure: CYSTOSCOPY WITH BIOPSY;  Surgeon: Royston Cowper, MD;  Location: [facility] ORS;  Service: Urology;  Laterality: N/A;  . EXPLORATORY LAPAROTOMY  1991   VOLVULUS AND BOWEL OBSTRUCTION  . ILIAC ARTERY STENT Bilateral   . TONSILLECTOMY    . TRANSURETHRAL RESECTION OF BLADDER TUMOR N/A 12/29/2015   Procedure: TRANSURETHRAL RESECTION OF BLADDER TUMOR (TURBT)/MITOMYCIN INSTILLATION;  Surgeon: Royston Cowper, MD;  Location: [facility] ORS;  Service: Urology;  Laterality: N/A;  . TRANSURETHRAL RESECTION OF BLADDER TUMOR WITH GYRUS (TURBT-GYRUS)  2013  . VASECTOMY      There were no vitals filed for this  visit.  Subjective Assessment - 12/06/18 1517    Subjective  Pt states that he is doing well today. Performing HEP consistently at home. Reports that he was tired after the last therapy session. He denies any pain upon arrival. No specific questions or concerns currently.     Pertinent History  Patient reports his main issue is his balance. States he has a new doctor that refered him to this clinic instead of outpatient in [facility] where he has received physical therapy prior. Has received therapy for balance in the past (approximately 1 year ago). States he has fallen twice recently when he tripped over leg lift of recliner (6 weeks ago), and fell carrying groceries while trying to open door (4 weeks ago). Reports he does have a fear of falling while walking. States he has circulation issues in his legs that has returned and has not told doctor. Completes water aerobics 2-3x a week. Patient states he also has shoulder pain and back pain, and that shoulder hurts is when he raises arm in abduction against resistance and when rolling over in bed. Used to enjoy playing golf. Would like to be able to walk around the block with his wife.     Limitations  Standing;Walking;House hold activities    How long can you stand comfortably?  5 minutes  How long can you walk comfortably?  5 minutes     Currently in Pain?  No/denies          TREATMENT   Ther-ex Octane HIIT L8 x 30s, L4 x 60s x 5 minutes for warm-up(unbilled); Quantum leg press 180# x 20, 195# x 20; Standing mini squats without UE support x 10; Sit to stand from regular height chair without UE support as fast as possible for 30s, pt complete 12 repetitions;   Neuromuscular Re-education Gait in hallway withhorizontalballtossto therapistx 75'toeachside; Gait in hallway with vertical ball toss to self x 86' with extensive cues for cervical flexion/extension; Forward and then retro ambulation in hallway with ball pass over  shoulder at varying heights between waist and overhead with return pass over opposite shoulder 75' x 2 each. Pt requires repeated cues to turn head and follow ball with eyes during passes; Forward ambulation with lateral bounce pass to therapist x 75' each direction; Braiding in // bars x 6 lengths; Tandem gait on 2"x4" x 6 lengths; Side stepping on 2"x4" x 6 lengths; Semitandem balance on Airex pad with horizontal head turns alternating forward LE x 30s each; Semitandem balance on Airex pad with vertical head turns alternating forward LE x 30s each; Worked on stepping strategy in bars with unpredicted removal of support x 3 forward, x 3 backward;   Pt educated throughout session about proper posture and technique with exercises. Improved exercise technique, movement at target joints, use of target muscles after min to mod verbal, visual, tactile cues.   Pt demonstrates good motivation during session. He demonstrates less lateral gait deviation when performing head turns during ball tosses today. Delayed reaction times when working on stepping strategies. He is able to complete all exercises as instructed during session and is able to increase the resistance on the leg press.Pt encouraged to continue HEP and follow-up as scheduled.Hewill benefit from PT services to address deficits in strength, balance, and mobility in order to return to full function at home.                        PT Short Term Goals - 11/27/18 1438      PT SHORT TERM GOAL #1   Title  Patient will be independent with completion of HEP to improve ability to complete functional tasks.    Baseline  HEP compliant; 11/27/18: Pt only performing 3d/wk    Time  2    Period  Weeks    Status  On-going    Target Date  12/25/18      PT SHORT TERM GOAL #2   Title  Patient will report no falls in the next two weeks to demonstrate improved safety and fall risk. 11/27/18: Pt fell Saturday morning while he  was putting something in the bottom shelf of refrigerator    Baseline  07/09/18: fell 2x in 6 weeks 1/7: no falls; 11/27/18: Pt fell 3 days prior to treatment    Time  2    Period  Weeks    Status  On-going    Target Date  12/25/18        PT Long Term Goals - 11/27/18 1440      PT LONG TERM GOAL #1   Title  Patient will improve 5x sit to stand to <10seconds without UE support to demonstrate improvements in functional strength.     Baseline  07/09/18: 13sec hands on knees 1/7: 13 seconds occasional hand on knee;  11/27/18: 12.3s no UE support, occasional posterior leaning    Time  8    Period  Weeks    Status  Partially Met    Target Date  01/22/19      PT LONG TERM GOAL #2   Title  Patient will increase BERG balance score to >52/56 to decrease fall risk and improve balance.     Baseline  07/09/18: 47/56 1/7: 52/56; 11/27/18: 52/56    Time  8    Period  Weeks    Status  Partially Met    Target Date  01/22/19      PT LONG TERM GOAL #3   Title  Patient will be able to walk with wife around neighborhood (>38mnutes) to demonstrate improved balance and community ambulation.     Baseline  07/09/18: 545mutes 1/7: wife just returned; 11/27/18: has not attempted    Time  6    Period  Weeks    Status  On-going    Target Date  01/22/19      PT LONG TERM GOAL #4   Title  Patient will improve 1012mto >1.20m/86mimiting fall risk and improve community ambulation.     Baseline  07/09/18: 0.833m/420m/7: 1.25 m/s; 11/27/18: 7.9s=1.27 m/s    Time  6    Period  Weeks    Status  Achieved      PT LONG TERM GOAL #5   Title  Patient will increase DGI to >18/24 to indicate increased stability with dynamic mobility and decrease fall risk.     Baseline  1/7: 14/24; 11/27/18: 17/24    Time  6    Period  Weeks    Status  On-going    Target Date  01/22/19      PT LONG TERM GOAL #6   Title  Pt able to descend 8 steps with single UE assist with no LOB or heel catching on step in order to improve community  access and safety on steps.    Baseline  1/7: BUE support and heel catching; 11/27/18: Heel still catching    Time  8    Period  Weeks    Status  New    Target Date  01/22/19            Plan - 12/06/18 1728    Clinical Impression Statement  Pt demonstrates good motivation during session. He demonstrates less lateral gait deviation when performing head turns during ball tosses today. Delayed reaction times when working on stepping strategies. He is able to complete all exercises as instructed during session and is able to increase the resistance on the leg press.Pt encouraged to continue HEP and follow-up as scheduled.Hewill benefit from PT services to address deficits in strength, balance, and mobility in order to return to full function at home.    Rehab Potential  Good    Clinical Impairments Affecting Rehab Potential  (+) support system (-) age;     PT Frequency  2x / week    PT Duration  8 weeks    PT Treatment/Interventions  ADLs/Self Care Home Management;Energy conservation;Patient/family education;Therapeutic exercise;Balance training;Neuromuscular re-education;Therapeutic activities;Functional mobility training;Stair training;DME Instruction;Gait training;Aquatic Therapy;Cryotherapy;Electrical Stimulation;Iontophoresis 4mg/m95mexamethasone;Moist Heat;Traction;Ultrasound;Taping;Passive range of motion;Vestibular;Manual techniques    PT Next Visit Plan  Review HEP, strength, balance     PT Home Exercise Plan  Semitandem balance, semitandem balance with horizontal head turns, single leg balance, heel/toe raises ([id number] and Agree with Plan of Care  Patient  Patient will benefit from skilled therapeutic intervention in order to improve the following deficits and impairments:  Abnormal gait, Decreased activity tolerance, Decreased balance, Decreased coordination, Decreased endurance, Decreased mobility, Decreased strength, Difficulty walking, Impaired sensation,  Pain, Impaired UE functional use, Postural dysfunction, Improper body mechanics, Impaired perceived functional ability, Decreased knowledge of use of DME  Visit Diagnosis: Muscle weakness (generalized)  Gait difficulty  Loss of balance     Problem List There are no active problems to display for this patient.  Phillips Grout PT, DPT, GCS  Braydin Aloi 12/06/2018, 5:31 PM  [place] MAIN [facility] SERVICES [address] [place], [place], [address] Phone: [phone]   Fax:  [phone]  Name: ISADOR CASTILLE MRN: [id number] Date of Birth: 01/14/1932

## 2018-12-11 ENCOUNTER — Ambulatory Visit: Payer: Medicare Other | Attending: Family Medicine | Admitting: Physical Therapy

## 2018-12-11 VITALS — BP 176/74 | HR 55

## 2018-12-11 DIAGNOSIS — R2681 Unsteadiness on feet: Secondary | ICD-10-CM | POA: Insufficient documentation

## 2018-12-11 DIAGNOSIS — M25552 Pain in left hip: Secondary | ICD-10-CM | POA: Insufficient documentation

## 2018-12-11 DIAGNOSIS — R269 Unspecified abnormalities of gait and mobility: Secondary | ICD-10-CM | POA: Diagnosis present

## 2018-12-11 DIAGNOSIS — M25551 Pain in right hip: Secondary | ICD-10-CM | POA: Insufficient documentation

## 2018-12-11 DIAGNOSIS — M6281 Muscle weakness (generalized): Secondary | ICD-10-CM | POA: Insufficient documentation

## 2018-12-11 DIAGNOSIS — R2689 Other abnormalities of gait and mobility: Secondary | ICD-10-CM | POA: Insufficient documentation

## 2018-12-12 NOTE — Therapy (Signed)
[place] MAIN [facility] SERVICES [address] [place], [place], [address] Phone: [phone]   Fax:  [phone]  Physical Therapy Treatment  Patient Details  Name: Eric Torres MRN: [id number] Date of Birth: 02-Apr-1932 Referring Provider (PT): Mcneil Sober   Encounter Date: 12/11/2018  PT End of Session - 12/11/18 1425    Visit Number  28    Number of Visits  44    Date for PT Re-Evaluation  01/22/19    Authorization Type  Last goals 11/27/18    PT Start Time  1430    PT Stop Time  1511    PT Time Calculation (min)  41 min    Equipment Utilized During Treatment  Gait belt    Activity Tolerance  Patient tolerated treatment well    Behavior During Therapy  WFL for tasks assessed/performed       Past Medical History:  Diagnosis Date  . BPH (benign prostatic hyperplasia)   . 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 COPD (chronic obstructive pulmonary disease) (HCC)    MILD-NO INHALERS PER PT  . Essential tremor   . Heart murmur   . History of brachytherapy   . History of TIA (transient ischemic attack) 2015   LEFT THUMB AND INDEX FINGER-SOMETIMES I DROP THINGS  . Hypercholesteremia   . Hypertension   . Hypothyroidism     Past Surgical History:  Procedure Laterality Date  . CHOLECYSTECTOMY    . CYSTOSCOPY WITH BIOPSY N/A 12/29/2015   Procedure: CYSTOSCOPY WITH BIOPSY;  Surgeon: Royston Cowper, MD;  Location: [facility] ORS;  Service: Urology;  Laterality: N/A;  . EXPLORATORY LAPAROTOMY  1991   VOLVULUS AND BOWEL OBSTRUCTION  . ILIAC ARTERY STENT Bilateral   . TONSILLECTOMY    . TRANSURETHRAL RESECTION OF BLADDER TUMOR N/A 12/29/2015   Procedure: TRANSURETHRAL RESECTION OF BLADDER TUMOR (TURBT)/MITOMYCIN INSTILLATION;  Surgeon: Royston Cowper, MD;  Location: [facility] ORS;  Service: Urology;  Laterality: N/A;  . TRANSURETHRAL RESECTION OF BLADDER TUMOR WITH GYRUS (TURBT-GYRUS)  2013  . VASECTOMY      Vitals:   12/11/18 1432  BP: (!)  176/74  Pulse: (!) 55  SpO2: 100%    Subjective Assessment - 12/11/18 1433    Subjective  Patient states that he had staples removed from his head yesterday and has no concerns. Patient also reports feeling a little wobbly in his legs, but denies any dizziness.     Pertinent History  Patient reports his main issue is his balance. States he has a new doctor that refered him to this clinic instead of outpatient [facility] where he has received physical therapy prior. Has received therapy for balance in the past (approximately 1 year ago). States he has fallen twice recently when he tripped over leg lift of recliner (6 weeks ago), and fell carrying groceries while trying to open door (4 weeks ago). Reports he does have a fear of falling while walking. States he has circulation issues in his legs that has returned and has not told doctor. Completes water aerobics 2-3x a week. Patient states he also has shoulder pain and back pain, and that shoulder hurts is when he raises arm in abduction against resistance and when rolling over in bed. Used to enjoy playing golf. Would like to be able to walk around the block with his wife.     Limitations  Standing;Walking;House hold activities    How long can you stand comfortably?  5 minutes    How long can you walk comfortably?  5 minutes     Currently in Pain?  No/denies       TREATMENT  Neuromuscular Re-education Gait in hallway withhorizontalballtossto therapist2 x 75'toeachside; Gait in hallway with vertical ball toss to self 2 x 75' with moderate cues for cervical flexion/extension; Forward ambulation in hallway with ball pass over shoulder at varying heights between waist and overhead with return pass over opposite shoulder 75' x 2 each. Pt continues to require frequent cues to turn head and follow ball with gaze during passes; Forward ambulation with bounce pass to self 2x 75' each direction; Forward ambulation with bounce-vertical toss to self  combination 2x75' with continued cueing for complete cervical motion and following gaze Tandem gait on airex beam x 12 lengths with considerable VCs and TCs for balance strategies (hip and ankle primarily) and frequent reinforcement for positioning of center of mass to ensure maintained balance. Intermittent UE support. Sit to stand from regular height chair x12 with emphasis on collecting balance at varying points in the transfer. Patient defaults to posterior lean as opposed to fully activating glutes, and as such had 3 uncontrolled descents to the chair.  Patient Response to Interventions: Patient expressed frustration with the reality that his balance is impaired and will likely not improve to where he was 10 years ago. Patient also reflected that he knows he is making progress and acknowledges that PT is helping, even if he won't ever get back to his old 100%.  ASSESSMENT Patient presents to clinic with excellent motivation to participate in therapy despite feeling unsteady. Patient demonstrates deficits in balance as evidenced by LOB and increased unsteadiness during dual task gait with vestibular challenge and reliance on CGA/SBA or UE support to prevent LOB on compliant surface. Patient was able to achieve good cervical range of motion during gait with cueing during today's session and responded positively to neuromuscular re-education interventions. Patient will benefit from continued skilled therapeutic intervention to address deficits in balance, strength, and function in order to decrease risk of falls, increase function, and improve overall QOL.   PT Education - 12/11/18 1518    Education provided  Yes    Education Details  balance strategies on compliance surface    Person(s) Educated  Patient    Methods  Explanation;Demonstration;Tactile cues;Verbal cues    Comprehension  Verbalized understanding;Need further instruction;Returned demonstration       PT Short Term Goals - 11/27/18  1438      PT SHORT TERM GOAL #1   Title  Patient will be independent with completion of HEP to improve ability to complete functional tasks.    Baseline  HEP compliant; 11/27/18: Pt only performing 3d/wk    Time  2    Period  Weeks    Status  On-going    Target Date  12/25/18      PT SHORT TERM GOAL #2   Title  Patient will report no falls in the next two weeks to demonstrate improved safety and fall risk. 11/27/18: Pt fell Saturday morning while he was putting something in the bottom shelf of refrigerator    Baseline  07/09/18: fell 2x in 6 weeks 1/7: no falls; 11/27/18: Pt fell 3 days prior to treatment    Time  2    Period  Weeks    Status  On-going    Target Date  12/25/18        PT Long Term Goals - 11/27/18  1440      PT LONG TERM GOAL #1   Title  Patient will improve 5x sit to stand to <10seconds without UE support to demonstrate improvements in functional strength.     Baseline  07/09/18: 13sec hands on knees 1/7: 13 seconds occasional hand on knee; 11/27/18: 12.3s no UE support, occasional posterior leaning    Time  8    Period  Weeks    Status  Partially Met    Target Date  01/22/19      PT LONG TERM GOAL #2   Title  Patient will increase BERG balance score to >52/56 to decrease fall risk and improve balance.     Baseline  07/09/18: 47/56 1/7: 52/56; 11/27/18: 52/56    Time  8    Period  Weeks    Status  Partially Met    Target Date  01/22/19      PT LONG TERM GOAL #3   Title  Patient will be able to walk with wife around neighborhood (>48mnutes) to demonstrate improved balance and community ambulation.     Baseline  07/09/18: 556mutes 1/7: wife just returned; 11/27/18: has not attempted    Time  6    Period  Weeks    Status  On-going    Target Date  01/22/19      PT LONG TERM GOAL #4   Title  Patient will improve 10573mto >1.73m/44mimiting fall risk and improve community ambulation.     Baseline  07/09/18: 0.833m/373m/7: 1.25 m/s; 11/27/18: 7.9s=1.27 m/s    Time  6     Period  Weeks    Status  Achieved      PT LONG TERM GOAL #5   Title  Patient will increase DGI to >18/24 to indicate increased stability with dynamic mobility and decrease fall risk.     Baseline  1/7: 14/24; 11/27/18: 17/24    Time  6    Period  Weeks    Status  On-going    Target Date  01/22/19      PT LONG TERM GOAL #6   Title  Pt able to descend 8 steps with single UE assist with no LOB or heel catching on step in order to improve community access and safety on steps.    Baseline  1/7: BUE support and heel catching; 11/27/18: Heel still catching    Time  8    Period  Weeks    Status  New    Target Date  01/22/19            Plan - 12/12/18 1542    Clinical Impression Statement  Patient presents to clinic with excellent motivation to participate in therapy despite feeling unsteady. Patient demonstrates deficits in balance as evidenced by LOB and increased unsteadiness during dual task gait with vestibular challenge and reliance on CGA/SBA or UE support to prevent LOB on compliant surface. Patient was able to achieve good cervical range of motion during gait with cueing during today's session and responded positively to neuromuscular re-education interventions. Patient will benefit from continued skilled therapeutic intervention to address deficits in balance, strength, and function in order to decrease risk of falls, increase function, and improve overall QOL.    Rehab Potential  Good    Clinical Impairments Affecting Rehab Potential  (+) support system (-) age;     PT Frequency  2x / week    PT Duration  8 weeks    PT Treatment/Interventions  ADLs/Self  Care Home Management;Energy conservation;Patient/family education;Therapeutic exercise;Balance training;Neuromuscular re-education;Therapeutic activities;Functional mobility training;Stair training;DME Instruction;Gait training;Aquatic Therapy;Cryotherapy;Electrical Stimulation;Iontophoresis 59m/ml Dexamethasone;Moist  Heat;Traction;Ultrasound;Taping;Passive range of motion;Vestibular;Manual techniques    PT Next Visit Plan  Review HEP, strength, balance     PT Home Exercise Plan  Semitandem balance, semitandem balance with horizontal head turns, single leg balance, heel/toe raises (([id number]    Consulted and Agree with Plan of Care  Patient       Patient will benefit from skilled therapeutic intervention in order to improve the following deficits and impairments:  Abnormal gait, Decreased activity tolerance, Decreased balance, Decreased coordination, Decreased endurance, Decreased mobility, Decreased strength, Difficulty walking, Impaired sensation, Pain, Impaired UE functional use, Postural dysfunction, Improper body mechanics, Impaired perceived functional ability, Decreased knowledge of use of DME  Visit Diagnosis: Muscle weakness (generalized)  Gait difficulty  Loss of balance  Unsteady gait  Impairment of balance  Pain in left hip  Pain in right hip     Problem List There are no active problems to display for this patient.  KMyles GipPT, DPT #[phone]/01/2019, 3:42 PM  [place] [facility] [address] [address]Phone: [phone]  Fax:  [phone] Name: Eric CAPOZZIMRN: [id number] of Birth: 710-18-1933

## 2018-12-13 ENCOUNTER — Ambulatory Visit: Payer: Medicare Other

## 2018-12-18 ENCOUNTER — Ambulatory Visit: Payer: Medicare Other

## 2018-12-18 VITALS — BP 134/53 | HR 73

## 2018-12-18 DIAGNOSIS — R269 Unspecified abnormalities of gait and mobility: Secondary | ICD-10-CM

## 2018-12-18 DIAGNOSIS — M6281 Muscle weakness (generalized): Secondary | ICD-10-CM | POA: Diagnosis not present

## 2018-12-18 NOTE — Therapy (Signed)
[place] MAIN [facility] SERVICES [address] [place], [place], [address] Phone: [phone]   Fax:  [phone]  Physical Therapy Treatment  Patient Details  Name: Eric Torres MRN: [id number] Date of Birth: Jul 17, 1932 Referring Provider (PT): Mcneil Sober   Encounter Date: 12/18/2018  PT End of Session - 12/18/18 1444    Visit Number  29    Number of Visits  44    Date for PT Re-Evaluation  01/22/19    Authorization Type  Last goals 11/27/18    PT Start Time  1440    PT Stop Time  1525    PT Time Calculation (min)  45 min    Equipment Utilized During Treatment  Gait belt    Activity Tolerance  Patient tolerated treatment well    Behavior During Therapy  WFL for tasks assessed/performed       Past Medical History:  Diagnosis Date  . BPH (benign prostatic hyperplasia)   . 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 COPD (chronic obstructive pulmonary disease) (HCC)    MILD-NO INHALERS PER PT  . Essential tremor   . Heart murmur   . History of brachytherapy   . History of TIA (transient ischemic attack) 2015   LEFT THUMB AND INDEX FINGER-SOMETIMES I DROP THINGS  . Hypercholesteremia   . Hypertension   . Hypothyroidism     Past Surgical History:  Procedure Laterality Date  . CHOLECYSTECTOMY    . CYSTOSCOPY WITH BIOPSY N/A 12/29/2015   Procedure: CYSTOSCOPY WITH BIOPSY;  Surgeon: Royston Cowper, MD;  Location: [facility] ORS;  Service: Urology;  Laterality: N/A;  . EXPLORATORY LAPAROTOMY  1991   VOLVULUS AND BOWEL OBSTRUCTION  . ILIAC ARTERY STENT Bilateral   . TONSILLECTOMY    . TRANSURETHRAL RESECTION OF BLADDER TUMOR N/A 12/29/2015   Procedure: TRANSURETHRAL RESECTION OF BLADDER TUMOR (TURBT)/MITOMYCIN INSTILLATION;  Surgeon: Royston Cowper, MD;  Location: [facility] ORS;  Service: Urology;  Laterality: N/A;  . TRANSURETHRAL RESECTION OF BLADDER TUMOR WITH GYRUS (TURBT-GYRUS)  2013  . VASECTOMY      Vitals:   12/18/18 1444  BP: (!)  134/53  Pulse: 73    Subjective Assessment - 12/18/18 1442    Subjective  Pt reports that he is doing well today. No acute pain reported today. Stil has some "fuzziness" in his eyes. He went to the gym this morning. No specific questions or concerns at this time.     Pertinent History  Patient reports his main issue is his balance. States he has a new doctor that refered him to this clinic instead of outpatient in [facility] where he has received physical therapy prior. Has received therapy for balance in the past (approximately 1 year ago). States he has fallen twice recently when he tripped over leg lift of recliner (6 weeks ago), and fell carrying groceries while trying to open door (4 weeks ago). Reports he does have a fear of falling while walking. States he has circulation issues in his legs that has returned and has not told doctor. Completes water aerobics 2-3x a week. Patient states he also has shoulder pain and back pain, and that shoulder hurts is when he raises arm in abduction against resistance and when rolling over in bed. Used to enjoy playing golf. Would like to be able to walk around the block with his wife.     Limitations  Standing;Walking;House hold activities    How long can you stand  comfortably?  5 minutes    How long can you walk comfortably?  5 minutes     Currently in Pain?  No/denies            TREATMENT   Ther-ex Octane HIIT L8x 30s, L4x 60s x 5 minutes for warm-up(unbilled); Quantum leg press 195# x 20, 210# x 20; Sit to stand from regular height chair without UE support and Airex under feet x 10; Sit to stand from regular height chair without UE support performing horizontal head turns and Airex under feet x 10; Standing mini squats without UE support x 10;   Neuromuscular Re-education Forward/Retro gait in hallway withhorizontalballtossto therapistx 75'toeachside; Forward/Retro gait in hallway with vertical ball toss to self x 44' with  extensive cues for cervical flexion/extension; Forward ambulation with lateral bounce pass to therapist x 75' each direction; Tandem gait in // bars x 2 lengths; Tandem gait on Airex balance beam x 2 lengths each direction; Side stepping on Airex balance beam x 2 lengths each direction;   Pt educated throughout session about proper posture and technique with exercises. Improved exercise technique, movement at target joints, use of target muscles after min to mod verbal, visual, tactile cues.   Pt demonstrates good motivation during session. He demonstrates less lateral gait deviation when performing head turns during ball tosses today. He is able to increase his leg press resistance today.Pt encouraged to continue HEP and follow-up as scheduled.Hewill benefit from PT services to address deficits in strength, balance, and mobility in order to return to full function at home.                       PT Short Term Goals - 11/27/18 1438      PT SHORT TERM GOAL #1   Title  Patient will be independent with completion of HEP to improve ability to complete functional tasks.    Baseline  HEP compliant; 11/27/18: Pt only performing 3d/wk    Time  2    Period  Weeks    Status  On-going    Target Date  12/25/18      PT SHORT TERM GOAL #2   Title  Patient will report no falls in the next two weeks to demonstrate improved safety and fall risk. 11/27/18: Pt fell Saturday morning while he was putting something in the bottom shelf of refrigerator    Baseline  07/09/18: fell 2x in 6 weeks 1/7: no falls; 11/27/18: Pt fell 3 days prior to treatment    Time  2    Period  Weeks    Status  On-going    Target Date  12/25/18        PT Long Term Goals - 11/27/18 1440      PT LONG TERM GOAL #1   Title  Patient will improve 5x sit to stand to <10seconds without UE support to demonstrate improvements in functional strength.     Baseline  07/09/18: 13sec hands on knees 1/7: 13 seconds  occasional hand on knee; 11/27/18: 12.3s no UE support, occasional posterior leaning    Time  8    Period  Weeks    Status  Partially Met    Target Date  01/22/19      PT LONG TERM GOAL #2   Title  Patient will increase BERG balance score to >52/56 to decrease fall risk and improve balance.     Baseline  07/09/18: 47/56 1/7: 52/56; 11/27/18: 32/95  Time  8    Period  Weeks    Status  Partially Met    Target Date  01/22/19      PT LONG TERM GOAL #3   Title  Patient will be able to walk with wife around neighborhood (>12mnutes) to demonstrate improved balance and community ambulation.     Baseline  07/09/18: 541mutes 1/7: wife just returned; 11/27/18: has not attempted    Time  6    Period  Weeks    Status  On-going    Target Date  01/22/19      PT LONG TERM GOAL #4   Title  Patient will improve 1081mto >1.29m/52mimiting fall risk and improve community ambulation.     Baseline  07/09/18: 0.833m/229m/7: 1.25 m/s; 11/27/18: 7.9s=1.27 m/s    Time  6    Period  Weeks    Status  Achieved      PT LONG TERM GOAL #5   Title  Patient will increase DGI to >18/24 to indicate increased stability with dynamic mobility and decrease fall risk.     Baseline  1/7: 14/24; 11/27/18: 17/24    Time  6    Period  Weeks    Status  On-going    Target Date  01/22/19      PT LONG TERM GOAL #6   Title  Pt able to descend 8 steps with single UE assist with no LOB or heel catching on step in order to improve community access and safety on steps.    Baseline  1/7: BUE support and heel catching; 11/27/18: Heel still catching    Time  8    Period  Weeks    Status  New    Target Date  01/22/19            Plan - 12/18/18 1445    Clinical Impression Statement  Pt demonstrates good motivation during session. He demonstrates less lateral gait deviation when performing head turns during ball tosses today. He is able to increase his leg press resistance today.Pt encouraged to continue HEP and follow-up as  scheduled.Hewill benefit from PT services to address deficits in strength, balance, and mobility in order to return to full function at home.    Rehab Potential  Good    Clinical Impairments Affecting Rehab Potential  (+) support system (-) age;     PT Frequency  2x / week    PT Duration  8 weeks    PT Treatment/Interventions  ADLs/Self Care Home Management;Energy conservation;Patient/family education;Therapeutic exercise;Balance training;Neuromuscular re-education;Therapeutic activities;Functional mobility training;Stair training;DME Instruction;Gait training;Aquatic Therapy;Cryotherapy;Electrical Stimulation;Iontophoresis 4mg/m79mexamethasone;Moist Heat;Traction;Ultrasound;Taping;Passive range of motion;Vestibular;Manual techniques    PT Next Visit Plan  Review HEP, strength, balance     PT Home Exercise Plan  Semitandem balance, semitandem balance with horizontal head turns, single leg balance, heel/toe raises ([id number] and Agree with Plan of Care  Patient       Patient will benefit from skilled therapeutic intervention in order to improve the following deficits and impairments:  Abnormal gait, Decreased activity tolerance, Decreased balance, Decreased coordination, Decreased endurance, Decreased mobility, Decreased strength, Difficulty walking, Impaired sensation, Pain, Impaired UE functional use, Postural dysfunction, Improper body mechanics, Impaired perceived functional ability, Decreased knowledge of use of DME  Visit Diagnosis: Muscle weakness (generalized)  Gait difficulty     Problem List There are no active problems to display for this patient.   Lyndel Safech PT, DPT, GCS  , 12/19/2018, 4:49 PM  [place] MAIN [facility] SERVICES [address] [place], [place], [address] Phone: [phone]   Fax:  [phone]  Name: Eric Torres MRN: [id number] Date of Birth: 06-14-1932

## 2018-12-20 ENCOUNTER — Other Ambulatory Visit: Payer: Self-pay

## 2018-12-20 ENCOUNTER — Ambulatory Visit: Payer: Medicare Other

## 2018-12-20 VITALS — BP 152/64 | HR 87

## 2018-12-20 DIAGNOSIS — M6281 Muscle weakness (generalized): Secondary | ICD-10-CM | POA: Diagnosis not present

## 2018-12-20 DIAGNOSIS — R269 Unspecified abnormalities of gait and mobility: Secondary | ICD-10-CM

## 2018-12-20 NOTE — Therapy (Signed)
[place] MAIN [facility] SERVICES [address] [place], [place], [address] Phone: [phone]   Fax:  [phone]  Physical Therapy Progress Note   Dates of reporting period  11/01/18   to   12/20/18  Patient Details  Name: Eric Torres MRN: [id number] Date of Birth: [birth year] Referring Provider (PT): Mcneil Sober   Encounter Date: 12/20/2018    Past Medical History:  Diagnosis Date  . BPH (benign prostatic hyperplasia)   . 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 COPD (chronic obstructive pulmonary disease) (HCC)    MILD-NO INHALERS PER PT  . Essential tremor   . Heart murmur   . History of brachytherapy   . History of TIA (transient ischemic attack) 2015   LEFT THUMB AND INDEX FINGER-SOMETIMES I DROP THINGS  . Hypercholesteremia   . Hypertension   . Hypothyroidism     Past Surgical History:  Procedure Laterality Date  . CHOLECYSTECTOMY    . CYSTOSCOPY WITH BIOPSY N/A 12/29/2015   Procedure: CYSTOSCOPY WITH BIOPSY;  Surgeon: Royston Cowper, MD;  Location: [facility] ORS;  Service: Urology;  Laterality: N/A;  . EXPLORATORY LAPAROTOMY  1991   VOLVULUS AND BOWEL OBSTRUCTION  . ILIAC ARTERY STENT Bilateral   . TONSILLECTOMY    . TRANSURETHRAL RESECTION OF BLADDER TUMOR N/A 12/29/2015   Procedure: TRANSURETHRAL RESECTION OF BLADDER TUMOR (TURBT)/MITOMYCIN INSTILLATION;  Surgeon: Royston Cowper, MD;  Location: [facility] ORS;  Service: Urology;  Laterality: N/A;  . TRANSURETHRAL RESECTION OF BLADDER TUMOR WITH GYRUS (TURBT-GYRUS)  2013  . VASECTOMY      Vitals:   12/20/18 1439  BP: (!) 152/64  Pulse: 87  SpO2: 96%    Subjective Assessment - 12/22/18 1646    Subjective  Pt reports that he is doing alright today but he is struggling with some back pain. He rates his pain as a 4/10 currently. He gets intermittent back pain. No specific questions upon arrival.     Pertinent History  Patient reports his main issue is his balance. States he has a  new doctor that refered him to this clinic instead of outpatient in [facility] where he has received physical therapy prior. Has received therapy for balance in the past (approximately 1 year ago). States he has fallen twice recently when he tripped over leg lift of recliner (6 weeks ago), and fell carrying groceries while trying to open door (4 weeks ago). Reports he does have a fear of falling while walking. States he has circulation issues in his legs that has returned and has not told doctor. Completes water aerobics 2-3x a week. Patient states he also has shoulder pain and back pain, and that shoulder hurts is when he raises arm in abduction against resistance and when rolling over in bed. Used to enjoy playing golf. Would like to be able to walk around the block with his wife.     Limitations  Standing;Walking;House hold activities    How long can you stand comfortably?  5 minutes    How long can you walk comfortably?  5 minutes     Currently in Pain?  Yes    Pain Score  4     Pain Location  Back    Pain Orientation  Lower    Pain Descriptors / Indicators  Aching    Pain Type  Chronic pain    Pain Onset  More than a month ago    Pain Frequency  Intermittent  TREATMENT   Neuromuscular Re-education Updated outcome measures and goals with patient including: 5TSTS: 11.9s no UE support but occasional posterior leaning,  BERG: 53/56 26mgait speed: Self selected: 10.1s = 0.99 m/s, Fastest: 7.5s = 1.33 m/s; DGI: 20/24 6MWT: 1070'   Pt educated throughout session about proper posture and technique with outcome measures.    Updated outcome measures and goals with patient including 5TSTS, BERG, 151mait speed, DGI, and 6MWT. All of his outcome measures have improved but only slightly. The most notable change is his DGI improved from 17/24 to 20/24. However he continues to have gait deviations with head turns. He also continues to struggle with stairs and has not yet  attempted to walk around the neighborhood. His 6MWT is 1070which is below normative values for his age/gender. He reports consistency with his HEP.Pt will benefit from PT services to address deficits in strength, balance, and mobility in order to return to full function at home.                        PT Short Term Goals - 12/20/18 1442      PT SHORT TERM GOAL #1   Title  Patient will be independent with completion of HEP to improve ability to complete functional tasks.    Baseline  HEP compliant; 11/27/18: Pt only performing 3d/wk    Time  2    Period  Weeks    Status  On-going    Target Date  12/25/18      PT SHORT TERM GOAL #2   Title  Patient will report no falls in the next two weeks to demonstrate improved safety and fall risk. 11/27/18: Pt fell Saturday morning while he was putting something in the bottom shelf of refrigerator    Baseline  07/09/18: fell 2x in 6 weeks 1/7: no falls; 11/27/18: Pt fell 3 days prior to treatment; 12/20/18: No falls since 11/24/18    Time  2    Period  Weeks    Status  Achieved    Target Date  12/25/18        PT Long Term Goals - 12/20/18 1443      PT LONG TERM GOAL #1   Title  Patient will improve 5x sit to stand to <10seconds without UE support to demonstrate improvements in functional strength.     Baseline  07/09/18: 13sec hands on knees 1/7: 13 seconds occasional hand on knee; 11/27/18: 12.3s no UE support, occasional posterior leaning; 12/20/18: 11.9s with occasional posterior leaning    Time  8    Period  Weeks    Status  Partially Met    Target Date  01/22/19      PT LONG TERM GOAL #2   Title  Patient will increase BERG balance score to >52/56 to decrease fall risk and improve balance.     Baseline  07/09/18: 47/56 1/7: 52/56; 11/27/18: 52/56; 12/20/18: 53/56    Time  8    Period  Weeks    Status  Achieved      PT LONG TERM GOAL #3   Title  Patient will be able to walk with wife around neighborhood (>1521mtes) to  demonstrate improved balance and community ambulation.     Baseline  07/09/18: 5mi45mes 1/7: wife just returned; 11/27/18: has not attempted; 12/20/18: has not attempted yet    Time  6    Period  Weeks    Status  On-going  Target Date  01/22/19      PT LONG TERM GOAL #4   Title  Patient will improve 70mt to >1.066m limiting fall risk and improve community ambulation.     Baseline  07/09/18: 0.83321m 1/7: 1.25 m/s; 11/27/18: 7.9s=1.27 m/s; 12/20/18: Fastest: 7.5s = 1.33 m/s;    Time  6    Period  Weeks    Status  Achieved      PT LONG TERM GOAL #5   Title  Patient will increase DGI to >18/24 to indicate increased stability with dynamic mobility and decrease fall risk.     Baseline  1/7: 14/24; 11/27/18: 17/24; 12/20/18: 20/24;    Time  6    Period  Weeks    Status  Achieved      Additional Long Term Goals   Additional Long Term Goals  Yes      PT LONG TERM GOAL #6   Title  Pt able to descend 8 steps with single UE assist with no LOB or heel catching on step in order to improve community access and safety on steps.    Baseline  1/7: BUE support and heel catching; 11/27/18: Heel still catching; 12/20/18: Still requires UE assist    Time  8    Period  Weeks    Status  On-going    Target Date  01/22/19      PT LONG TERM GOAL #7   Title   Pt will improve DGI by at least 3 points in order to demonstrate clinically significant improvement in balance and decreased risk for falls     Baseline  12/20/18: 20/24    Time  8    Period  Weeks    Status  New    Target Date  01/22/19      PT LONG TERM GOAL #8   Title  Pt will increase 6MWT by at least 36m1m4ft71f order to demonstrate clinically significant improvement in cardiopulmonary endurance and community ambulation     Baseline  12/20/18: 1070'    Time  8    Period  Weeks    Status  New    Target Date  01/22/19            Plan - 12/21/18 1416    Clinical Impression Statement  Updated outcome measures and goals with patient  including 5TSTS, BERG, 15m g60mspeed, DGI, and 6MWT. All of his outcome measures have improved but only slightly. The most notable change is his DGI improved from 17/24 to 20/24. However he continues to have gait deviations with head turns. He also continues to struggle with stairs and has not yet attempted to walk around the neighborhood. His 6MWT is 1070' 65h is below normative values for his age/gender. He reports consistency with his HEP.Pt will benefit from PT services to address deficits in strength, balance, and mobility in order to return to full function at home.    Rehab Potential  Good    Clinical Impairments Affecting Rehab Potential  (+) support system (-) age;     PT Frequency  2x / week    PT Duration  8 weeks    PT Treatment/Interventions  ADLs/Self Care Home Management;Energy conservation;Patient/family education;Therapeutic exercise;Balance training;Neuromuscular re-education;Therapeutic activities;Functional mobility training;Stair training;DME Instruction;Gait training;Aquatic Therapy;Cryotherapy;Electrical Stimulation;Iontophoresis 4mg/ml30mxamethasone;Moist Heat;Traction;Ultrasound;Taping;Passive range of motion;Vestibular;Manual techniques    PT Next Visit Plan  Review HEP, strength, balance     PT Home Exercise Plan  Semitandem balance, semitandem balance with horizontal head turns,  single leg balance, heel/toe raises ([id number])    Consulted and Agree with Plan of Care  Patient       Patient will benefit from skilled therapeutic intervention in order to improve the following deficits and impairments:  Abnormal gait, Decreased activity tolerance, Decreased balance, Decreased coordination, Decreased endurance, Decreased mobility, Decreased strength, Difficulty walking, Impaired sensation, Pain, Impaired UE functional use, Postural dysfunction, Improper body mechanics, Impaired perceived functional ability, Decreased knowledge of use of DME  Visit Diagnosis: Muscle weakness  (generalized)  Gait difficulty     Problem List There are no active problems to display for this patient.   Huprich,Jason 12/22/2018, 5:26 PM  [place] MAIN [facility] SERVICES [address] [place], [place], [address] Phone: [phone]   Fax:  [phone]  Name: JARION HAWTHORNE MRN: [id number] Date of Birth: 1932-03-22

## 2018-12-25 ENCOUNTER — Ambulatory Visit: Payer: Medicare Other

## 2018-12-25 ENCOUNTER — Other Ambulatory Visit: Payer: Self-pay

## 2018-12-25 DIAGNOSIS — R269 Unspecified abnormalities of gait and mobility: Secondary | ICD-10-CM

## 2018-12-25 DIAGNOSIS — M6281 Muscle weakness (generalized): Secondary | ICD-10-CM

## 2018-12-25 NOTE — Therapy (Addendum)
[place] MAIN [facility] SERVICES [address] [place], [place], [address] Phone: [phone]   Fax:  [phone]  Physical Therapy Treatment  Patient Details  Name: Eric Torres MRN: [id number] Date of Birth: 23-Mar-1932 Referring Provider (PT): Mcneil Sober   Encounter Date: 12/25/2018  PT End of Session - 12/25/18 1546    Visit Number  31    Number of Visits  44    Date for PT Re-Evaluation  01/22/19    Authorization Type  Last goals 12/20/18; 1/10    PT Start Time  1431    PT Stop Time  1513    PT Time Calculation (min)  42 min    Equipment Utilized During Treatment  Gait belt    Activity Tolerance  Patient tolerated treatment well    Behavior During Therapy  WFL for tasks assessed/performed       Past Medical History:  Diagnosis Date  . BPH (benign prostatic hyperplasia)   . 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 COPD (chronic obstructive pulmonary disease) (HCC)    MILD-NO INHALERS PER PT  . Essential tremor   . Heart murmur   . History of brachytherapy   . History of TIA (transient ischemic attack) 2015   LEFT THUMB AND INDEX FINGER-SOMETIMES I DROP THINGS  . Hypercholesteremia   . Hypertension   . Hypothyroidism     Past Surgical History:  Procedure Laterality Date  . CHOLECYSTECTOMY    . CYSTOSCOPY WITH BIOPSY N/A 12/29/2015   Procedure: CYSTOSCOPY WITH BIOPSY;  Surgeon: Royston Cowper, MD;  Location: [facility] ORS;  Service: Urology;  Laterality: N/A;  . EXPLORATORY LAPAROTOMY  1991   VOLVULUS AND BOWEL OBSTRUCTION  . ILIAC ARTERY STENT Bilateral   . TONSILLECTOMY    . TRANSURETHRAL RESECTION OF BLADDER TUMOR N/A 12/29/2015   Procedure: TRANSURETHRAL RESECTION OF BLADDER TUMOR (TURBT)/MITOMYCIN INSTILLATION;  Surgeon: Royston Cowper, MD;  Location: [facility] ORS;  Service: Urology;  Laterality: N/A;  . TRANSURETHRAL RESECTION OF BLADDER TUMOR WITH GYRUS (TURBT-GYRUS)  2013  . VASECTOMY      There were no vitals filed for  this visit.  Subjective Assessment - 12/25/18 1543    Subjective  Patient reports that he is doing OK today. Denies pain today and falls/stumbles since last visit. Notes he has been consistent with his HEP and feels that they give him a good workout at home. Notes that he still notices he loses his balance in the dark (at night) and when he is standing up taking off his shirt (overhead).    Pertinent History  Patient reports his main issue is his balance. States he has a new doctor that refered him to this clinic instead of outpatient in [facility] where he has received physical therapy prior. Has received therapy for balance in the past (approximately 1 year ago). States he has fallen twice recently when he tripped over leg lift of recliner (6 weeks ago), and fell carrying groceries while trying to open door (4 weeks ago). Reports he does have a fear of falling while walking. States he has circulation issues in his legs that has returned and has not told doctor. Completes water aerobics 2-3x a week. Patient states he also has shoulder pain and back pain, and that shoulder hurts is when he raises arm in abduction against resistance and when rolling over in bed. Used to enjoy playing golf. Would like to be able to walk around the block with  his wife.     Limitations  Standing;Walking;House hold activities    How long can you stand comfortably?  5 minutes    How long can you walk comfortably?  5 minutes     Currently in Pain?  No/denies    Pain Onset  --        Ther-Ex  -Octane HIIT L4 x 60s x 6 minutes for cardiovascular support and BLE strengthening -Quantum leg press 195# x 25; patient benefits from verbal cues to slow down and breathe during performance    Neuromuscular Re-education  -Forward/Retro gait in hallway with bouncing 2 x 75' each; intermittent LOB with backwards walking requiring minimal SPT assistance to correct -Forward/Retro gait in hallway with vertical ball toss to self 2 x 75'  each; patient requires frequent cues to visually track ball; intermittent LOB with backwards walking requiring minimal SPT assistance to correct -Eyes closed normal stance balance on firm surface 1x30 seconds; CGA, no LOB -Eyes closed feet together stance balance on firm surface 1x30 seconds; CGA, no LOB but increased trunk sway -Alternating taps onto 4" step without UE support 2x10; CGA, one LOB caused by misstep requiring moderate assistance from SPT to correct; demonstrates fatigue towards end of second bout; patient indicates exercise is a good challenge                 PT Education - 12/25/18 1544    Education provided  Yes    Education Details  balance strategies; strengthening; static and dynamic balance; balance during ambulation    Person(s) Educated  Patient    Methods  Explanation;Demonstration;Tactile cues;Verbal cues    Comprehension  Verbalized understanding;Returned demonstration;Verbal cues required;Tactile cues required;Need further instruction       PT Short Term Goals - 12/20/18 1442      PT SHORT TERM GOAL #1   Title  Patient will be independent with completion of HEP to improve ability to complete functional tasks.    Baseline  HEP compliant; 11/27/18: Pt only performing 3d/wk    Time  2    Period  Weeks    Status  On-going    Target Date  12/25/18      PT SHORT TERM GOAL #2   Title  Patient will report no falls in the next two weeks to demonstrate improved safety and fall risk. 11/27/18: Pt fell Saturday morning while he was putting something in the bottom shelf of refrigerator    Baseline  07/09/18: fell 2x in 6 weeks 1/7: no falls; 11/27/18: Pt fell 3 days prior to treatment; 12/20/18: No falls since 11/24/18    Time  2    Period  Weeks    Status  Achieved    Target Date  12/25/18        PT Long Term Goals - 12/20/18 1443      PT LONG TERM GOAL #1   Title  Patient will improve 5x sit to stand to <10seconds without UE support to demonstrate  improvements in functional strength.     Baseline  07/09/18: 13sec hands on knees 1/7: 13 seconds occasional hand on knee; 11/27/18: 12.3s no UE support, occasional posterior leaning; 12/20/18: 11.9s with occasional posterior leaning    Time  8    Period  Weeks    Status  Partially Met    Target Date  01/22/19      PT LONG TERM GOAL #2   Title  Patient will increase BERG balance score to >52/56 to decrease  fall risk and improve balance.     Baseline  07/09/18: 47/56 1/7: 52/56; 11/27/18: 52/56; 12/20/18: 53/56    Time  8    Period  Weeks    Status  Achieved      PT LONG TERM GOAL #3   Title  Patient will be able to walk with wife around neighborhood (>867mnutes) to demonstrate improved balance and community ambulation.     Baseline  07/09/18: 5667mutes 1/7: wife just returned; 11/27/18: has not attempted; 12/20/18: has not attempted yet    Time  6    Period  Weeks    Status  On-going    Target Date  01/22/19      PT LONG TERM GOAL #4   Title  Patient will improve 1069mto >1.67m/41mimiting fall risk and improve community ambulation.     Baseline  07/09/18: 0.833m/27m/7: 1.25 m/s; 11/27/18: 7.9s=1.27 m/s; 12/20/18: Fastest: 7.5s = 1.33 m/s;    Time  6    Period  Weeks    Status  Achieved      PT LONG TERM GOAL #5   Title  Patient will increase DGI to >18/24 to indicate increased stability with dynamic mobility and decrease fall risk.     Baseline  1/7: 14/24; 11/27/18: 17/24; 12/20/18: 20/24;    Time  6    Period  Weeks    Status  Achieved      Additional Long Term Goals   Additional Long Term Goals  Yes      PT LONG TERM GOAL #6   Title  Pt able to descend 8 steps with single UE assist with no LOB or heel catching on step in order to improve community access and safety on steps.    Baseline  1/7: BUE support and heel catching; 11/27/18: Heel still catching; 12/20/18: Still requires UE assist    Time  8    Period  Weeks    Status  On-going    Target Date  01/22/19      PT LONG TERM GOAL  #7   Title   Pt will improve DGI by at least 3 points in order to demonstrate clinically significant improvement in balance and decreased risk for falls     Baseline  12/20/18: 20/24    Time  8    Period  Weeks    Status  New    Target Date  01/22/19      PT LONG TERM GOAL #8   Title  Pt will increase 6MWT by at least 567m (167mt) 75frder to demonstrate clinically significant improvement in cardiopulmonary endurance and community ambulation     Baseline  12/20/18: 1070'    Time  8    Period  Weeks    Status  New    Target Date  01/22/19            Plan - 12/25/18 1602    Clinical Impression Statement  The patient continues to progress with static and dynamic balance activities and balance during ambulation. The patient was able to perform dynamic ball activities while walking both forwards and backwards with only minor intermittent losses of balance requiring SPT assistance to correct. The patient was also able to perform alternating taps onto a 4" step today without UE support, but did experience one LOB due to a mis-step requiring moderate SPT assistance to correct (mis-step due to fatigue at end of second set). The patient will continue to benefit from skilled PT  in order to work towards goals, improve balance, and maximize safety and independence with functional mobility.    Rehab Potential  Good    Clinical Impairments Affecting Rehab Potential  (+) support system (-) age;     PT Frequency  2x / week    PT Duration  8 weeks    PT Treatment/Interventions  ADLs/Self Care Home Management;Energy conservation;Patient/family education;Therapeutic exercise;Balance training;Neuromuscular re-education;Therapeutic activities;Functional mobility training;Stair training;DME Instruction;Gait training;Aquatic Therapy;Cryotherapy;Electrical Stimulation;Iontophoresis 72m/ml Dexamethasone;Moist Heat;Traction;Ultrasound;Taping;Passive range of motion;Vestibular;Manual techniques    PT Next Visit Plan   Review HEP, strength, balance; static balance while engaged in UE task    PT Home Exercise Plan  Semitandem balance, semitandem balance with horizontal head turns, single leg balance, heel/toe raises (([id number]    Consulted and Agree with Plan of Care  Patient       Patient will benefit from skilled therapeutic intervention in order to improve the following deficits and impairments:  Abnormal gait, Decreased activity tolerance, Decreased balance, Decreased coordination, Decreased endurance, Decreased mobility, Decreased strength, Difficulty walking, Impaired sensation, Pain, Impaired UE functional use, Postural dysfunction, Improper body mechanics, Impaired perceived functional ability, Decreased knowledge of use of DME  Visit Diagnosis: Muscle weakness (generalized)  Gait difficulty     Problem List There are no active problems to display for this patient.  ZOrlean Patten SPT  This entire session was performed under direct supervision and direction of a licensed therapist/therapist assistant . I have personally read, edited and approve of the note as written.  MJanna Arch PT, DPT    12/25/2018, 4:06 PM  [place] [facility] [address] [address]Phone: [phone]  Fax:  [phone] Name: Eric POHLEMRN: [id number] of Birth: [birth year]

## 2018-12-27 ENCOUNTER — Other Ambulatory Visit: Payer: Self-pay

## 2018-12-27 ENCOUNTER — Ambulatory Visit: Payer: Medicare Other

## 2018-12-27 DIAGNOSIS — M6281 Muscle weakness (generalized): Secondary | ICD-10-CM | POA: Diagnosis not present

## 2018-12-27 DIAGNOSIS — R269 Unspecified abnormalities of gait and mobility: Secondary | ICD-10-CM

## 2018-12-27 NOTE — Therapy (Signed)
[place] MAIN [facility] SERVICES [address] [place], [place], [address] Phone: [phone]   Fax:  [phone]  Physical Therapy Treatment  Patient Details  Name: Eric Torres MRN: [id number] Date of Birth: 07/06/1932 Referring Provider (PT): Mcneil Sober   Encounter Date: 12/27/2018  PT End of Session - 12/31/18 1628    Visit Number  32    Number of Visits  44    Date for PT Re-Evaluation  01/22/19    Authorization Type  Last goals 12/20/18;    PT Start Time  1435    PT Stop Time  1520    PT Time Calculation (min)  45 min    Equipment Utilized During Treatment  Gait belt    Activity Tolerance  Patient tolerated treatment well    Behavior During Therapy  WFL for tasks assessed/performed       Past Medical History:  Diagnosis Date  . BPH (benign prostatic hyperplasia)   . 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 COPD (chronic obstructive pulmonary disease) (HCC)    MILD-NO INHALERS PER PT  . Essential tremor   . Heart murmur   . History of brachytherapy   . History of TIA (transient ischemic attack) 2015   LEFT THUMB AND INDEX FINGER-SOMETIMES I DROP THINGS  . Hypercholesteremia   . Hypertension   . Hypothyroidism     Past Surgical History:  Procedure Laterality Date  . CHOLECYSTECTOMY    . CYSTOSCOPY WITH BIOPSY N/A 12/29/2015   Procedure: CYSTOSCOPY WITH BIOPSY;  Surgeon: Royston Cowper, MD;  Location: [facility] ORS;  Service: Urology;  Laterality: N/A;  . EXPLORATORY LAPAROTOMY  1991   VOLVULUS AND BOWEL OBSTRUCTION  . ILIAC ARTERY STENT Bilateral   . TONSILLECTOMY    . TRANSURETHRAL RESECTION OF BLADDER TUMOR N/A 12/29/2015   Procedure: TRANSURETHRAL RESECTION OF BLADDER TUMOR (TURBT)/MITOMYCIN INSTILLATION;  Surgeon: Royston Cowper, MD;  Location: [facility] ORS;  Service: Urology;  Laterality: N/A;  . TRANSURETHRAL RESECTION OF BLADDER TUMOR WITH GYRUS (TURBT-GYRUS)  2013  . VASECTOMY      There were no vitals filed for this  visit.  Subjective Assessment - 12/31/18 1622    Subjective  Pt reports that he is doing alright today but he is having some back pain. He rates it as a 1/10 at rest but it increases to a 7/10 when he first gets up and starts moving after sitting. He is "somewhat consistent" with his HEP.     Pertinent History  Patient reports his main issue is his balance. States he has a new doctor that refered him to this clinic instead of outpatient in [facility] where he has received physical therapy prior. Has received therapy for balance in the past (approximately 1 year ago). States he has fallen twice recently when he tripped over leg lift of recliner (6 weeks ago), and fell carrying groceries while trying to open door (4 weeks ago). Reports he does have a fear of falling while walking. States he has circulation issues in his legs that has returned and has not told doctor. Completes water aerobics 2-3x a week. Patient states he also has shoulder pain and back pain, and that shoulder hurts is when he raises arm in abduction against resistance and when rolling over in bed. Used to enjoy playing golf. Would like to be able to walk around the block with his wife.     Limitations  Standing;Walking;House hold activities  How long can you stand comfortably?  5 minutes    How long can you walk comfortably?  5 minutes     Currently in Pain?  Yes    Pain Score  1     Pain Location  Back    Pain Orientation  Lower    Pain Descriptors / Indicators  Sore    Pain Type  Chronic pain    Pain Onset  More than a month ago    Pain Frequency  Intermittent        TREATMENT   Ther-Ex Octane HIIT L8 x 30s, L4x 60s x 5 minutes for cardiovascular support and BLE strengthening (3 minutes unbilled) Forward BOSU (round side up) lunges without UE support alternating LE x 10 each; Squats in // bars with extensive cues for proper form as well as CGA/minA x 10; Sit to stand without UE support x 14 (max reps in 30s); Quantum leg  press195# x 25, 210# x 20;    Neuromuscular Re-education Static balance on foam incline without UE support and eyes closed x 30s total;  Tandem gait in // bars without UE support x 4 lengths; Monofilament testing performed. Pt demonstrates almost fully intact sensation to 6.65, only about 50% sensation with 5.07, mostly completely absent with 4.31.    Pt educated throughout session about proper posture and technique with exercises. Improved exercise technique, movement at target joints, use of target muscles after min to mod verbal, visual, tactile cues.     Pt demonstrates good motivation during session. He is able to increase his leg press resistance. Monofilament testing with pt confirms likely peripheral neuropathy. Pt encouraged to continue HEP and follow-up as scheduled.Hewill benefit from PT services to address deficits in strength, balance, and mobility in order to return to full function at home.                 PT Short Term Goals - 12/20/18 1442      PT SHORT TERM GOAL #1   Title  Patient will be independent with completion of HEP to improve ability to complete functional tasks.    Baseline  HEP compliant; 11/27/18: Pt only performing 3d/wk    Time  2    Period  Weeks    Status  On-going    Target Date  12/25/18      PT SHORT TERM GOAL #2   Title  Patient will report no falls in the next two weeks to demonstrate improved safety and fall risk. 11/27/18: Pt fell Saturday morning while he was putting something in the bottom shelf of refrigerator    Baseline  07/09/18: fell 2x in 6 weeks 1/7: no falls; 11/27/18: Pt fell 3 days prior to treatment; 12/20/18: No falls since 11/24/18    Time  2    Period  Weeks    Status  Achieved    Target Date  12/25/18        PT Long Term Goals - 12/20/18 1443      PT LONG TERM GOAL #1   Title  Patient will improve 5x sit to stand to <10seconds without UE support to demonstrate improvements in functional strength.      Baseline  07/09/18: 13sec hands on knees 1/7: 13 seconds occasional hand on knee; 11/27/18: 12.3s no UE support, occasional posterior leaning; 12/20/18: 11.9s with occasional posterior leaning    Time  8    Period  Weeks    Status  Partially Met  Target Date  01/22/19      PT LONG TERM GOAL #2   Title  Patient will increase BERG balance score to >52/56 to decrease fall risk and improve balance.     Baseline  07/09/18: 47/56 1/7: 52/56; 11/27/18: 52/56; 12/20/18: 53/56    Time  8    Period  Weeks    Status  Achieved      PT LONG TERM GOAL #3   Title  Patient will be able to walk with wife around neighborhood (>9mnutes) to demonstrate improved balance and community ambulation.     Baseline  07/09/18: 519mutes 1/7: wife just returned; 11/27/18: has not attempted; 12/20/18: has not attempted yet    Time  6    Period  Weeks    Status  On-going    Target Date  01/22/19      PT LONG TERM GOAL #4   Title  Patient will improve 1012mto >1.2m/76mimiting fall risk and improve community ambulation.     Baseline  07/09/18: 0.833m/74m/7: 1.25 m/s; 11/27/18: 7.9s=1.27 m/s; 12/20/18: Fastest: 7.5s = 1.33 m/s;    Time  6    Period  Weeks    Status  Achieved      PT LONG TERM GOAL #5   Title  Patient will increase DGI to >18/24 to indicate increased stability with dynamic mobility and decrease fall risk.     Baseline  1/7: 14/24; 11/27/18: 17/24; 12/20/18: 20/24;    Time  6    Period  Weeks    Status  Achieved      Additional Long Term Goals   Additional Long Term Goals  Yes      PT LONG TERM GOAL #6   Title  Pt able to descend 8 steps with single UE assist with no LOB or heel catching on step in order to improve community access and safety on steps.    Baseline  1/7: BUE support and heel catching; 11/27/18: Heel still catching; 12/20/18: Still requires UE assist    Time  8    Period  Weeks    Status  On-going    Target Date  01/22/19      PT LONG TERM GOAL #7   Title   Pt will improve DGI by at  least 3 points in order to demonstrate clinically significant improvement in balance and decreased risk for falls     Baseline  12/20/18: 20/24    Time  8    Period  Weeks    Status  New    Target Date  01/22/19      PT LONG TERM GOAL #8   Title  Pt will increase 6MWT by at least 52m (32mt) 75frder to demonstrate clinically significant improvement in cardiopulmonary endurance and community ambulation     Baseline  12/20/18: 1070'    Time  8    Period  Weeks    Status  New    Target Date  01/22/19            Plan - 12/31/18 1628    Clinical Impression Statement  Pt demonstrates good motivation during session. He is able to increase his leg press resistance. Monofilament testing with pt confirms likely peripheral neuropathy. Pt encouraged to continue HEP and follow-up as scheduled. He will benefit from PT services to address deficits in strength, balance, and mobility in order to return to full function at home.    Rehab Potential  Good  Clinical Impairments Affecting Rehab Potential  (+) support system (-) age;     PT Frequency  2x / week    PT Duration  8 weeks    PT Treatment/Interventions  ADLs/Self Care Home Management;Energy conservation;Patient/family education;Therapeutic exercise;Balance training;Neuromuscular re-education;Therapeutic activities;Functional mobility training;Stair training;DME Instruction;Gait training;Aquatic Therapy;Cryotherapy;Electrical Stimulation;Iontophoresis 83m/ml Dexamethasone;Moist Heat;Traction;Ultrasound;Taping;Passive range of motion;Vestibular;Manual techniques    PT Next Visit Plan  Review HEP, strength, balance; static balance while engaged in UE task    PT Home Exercise Plan  Semitandem balance, semitandem balance with horizontal head turns, single leg balance, heel/toe raises (([id number]    Consulted and Agree with Plan of Care  Patient       Patient will benefit from skilled therapeutic intervention in order to improve the following  deficits and impairments:  Abnormal gait, Decreased activity tolerance, Decreased balance, Decreased coordination, Decreased endurance, Decreased mobility, Decreased strength, Difficulty walking, Impaired sensation, Pain, Impaired UE functional use, Postural dysfunction, Improper body mechanics, Impaired perceived functional ability, Decreased knowledge of use of DME  Visit Diagnosis: Muscle weakness (generalized)  Gait difficulty     Problem List There are no active problems to display for this patient.  Eric GroutPT, DPT, GCS  Eric Torres 12/31/2018, 4:28 PM  [place]MAIN [facility] [address] [address]Phone: [phone]  Fax:  [phone] Name: Eric CORLETTMRN: [id number] of Birth: [birth year]

## 2019-01-01 ENCOUNTER — Ambulatory Visit: Payer: Medicare Other

## 2019-01-01 NOTE — Therapy (Signed)
[place] MAIN [facility] SERVICES [address] [place], [place], [address] Phone: [phone]   Fax:  [phone]  Patient Details  Name: Eric Torres MRN: [id number] Date of Birth: 1931/12/10 Referring Provider:  No ref. provider found  Encounter Date: 01/01/2019   PT spoke with patient over the phone to notify of outpatient physical therapy clinic closure secondary to COVID-19 precautions. Pt does not have any questions at this time. Therapist requested patient call if needed with any questions or concerns regarding his home physical therapy program while clinic is closed. Therapist provided pt with therapist's e-mail address. Patient verbalized understanding.   Phillips Grout PT, DPT, GCS  Parnika Tweten 01/01/2019, 12:08 PM  [place] MAIN [facility] SERVICES [address] [place], [place], [address] Phone: [phone]   Fax:  [phone]

## 2019-01-03 ENCOUNTER — Ambulatory Visit: Payer: Medicare Other

## 2019-01-07 ENCOUNTER — Other Ambulatory Visit (HOSPITAL_COMMUNITY): Payer: Self-pay | Admitting: Internal Medicine

## 2019-01-07 ENCOUNTER — Other Ambulatory Visit: Payer: Self-pay | Admitting: Internal Medicine

## 2019-01-07 DIAGNOSIS — M4807 Spinal stenosis, lumbosacral region: Secondary | ICD-10-CM

## 2019-01-07 DIAGNOSIS — I10 Essential (primary) hypertension: Secondary | ICD-10-CM

## 2019-01-08 ENCOUNTER — Ambulatory Visit: Payer: Medicare Other

## 2019-01-10 ENCOUNTER — Ambulatory Visit: Payer: Medicare Other | Attending: Family Medicine

## 2019-01-10 DIAGNOSIS — R269 Unspecified abnormalities of gait and mobility: Secondary | ICD-10-CM | POA: Insufficient documentation

## 2019-01-10 DIAGNOSIS — M6281 Muscle weakness (generalized): Secondary | ICD-10-CM | POA: Insufficient documentation

## 2019-01-15 ENCOUNTER — Ambulatory Visit: Payer: Medicare Other

## 2019-01-16 ENCOUNTER — Other Ambulatory Visit: Payer: Self-pay | Admitting: Gastroenterology

## 2019-01-16 DIAGNOSIS — R1314 Dysphagia, pharyngoesophageal phase: Secondary | ICD-10-CM

## 2019-01-17 ENCOUNTER — Ambulatory Visit: Payer: Medicare Other

## 2019-01-17 NOTE — Therapy (Signed)
[place] MAIN [facility] SERVICES [address] [place], [place], [address] Phone: [phone]   Fax:  [phone]  Patient Details  Name: Eric Torres MRN: [id number] Date of Birth: 1931/10/30 Referring Provider:  No ref. provider found  Encounter Date: 01/17/2019  The patient has been contacted today in regards to telehealth services. The patient expressed an interest in participating in telehealth visits. Patient has been informed that an [facility] support representative will be reaching out to them to verify their insurance benefits and for scheduling    Phillips Grout PT, DPT, GCS  Huprich,Jason 01/17/2019, 2:24 PM  [place] [address] [place], [place], [address] Phone: [phone]   Fax:  [phone]

## 2019-01-22 ENCOUNTER — Ambulatory Visit: Payer: Medicare Other

## 2019-01-24 ENCOUNTER — Ambulatory Visit: Payer: Medicare Other

## 2019-01-29 ENCOUNTER — Ambulatory Visit: Payer: Medicare Other

## 2019-01-29 ENCOUNTER — Other Ambulatory Visit: Payer: Self-pay

## 2019-01-29 DIAGNOSIS — R269 Unspecified abnormalities of gait and mobility: Secondary | ICD-10-CM | POA: Diagnosis present

## 2019-01-29 DIAGNOSIS — M6281 Muscle weakness (generalized): Secondary | ICD-10-CM | POA: Diagnosis present

## 2019-01-29 NOTE — Therapy (Signed)
[place] [facility]IN [facility] SERVICES [address] [place], [place], [address] Phone: [phone]   Fax:  [phone]  Physical Therapy Treatment/Recertification  Patient Details  Name: Eric Torres MRN: [id number] Date of Birth: 04-Jun-1932 Referring Provider (PT): Mcneil Sober   Encounter Date: 01/29/2019  PT End of Session - 01/29/19 1516    Visit Number  33    Number of Visits  60    Date for PT Re-Evaluation  03/26/19    Authorization Type  Last goals 12/20/18; addressed via telemedicine 01/29/19    PT Start Time  1310    PT Stop Time  1405    PT Time Calculation (min)  55 min    Equipment Utilized During Treatment  Gait belt    Activity Tolerance  Patient tolerated treatment well    Behavior During Therapy  WFL for tasks assessed/performed       Past Medical History:  Diagnosis Date  . BPH (benign prostatic hyperplasia)   . [facility])    bladder dec 2013  . COPD (chronic obstructive pulmonary disease) (HCC)    MILD-NO INHALERS PER PT  . Essential tremor   . Heart murmur   . History of brachytherapy   . History of TIA (transient ischemic attack) 2015   LEFT THUMB AND INDEX FINGER-SOMETIMES I DROP THINGS  . Hypercholesteremia   . Hypertension   . Hypothyroidism     Past Surgical History:  Procedure Laterality Date  . CHOLECYSTECTOMY    . CYSTOSCOPY WITH BIOPSY N/A 12/29/2015   Procedure: CYSTOSCOPY WITH BIOPSY;  Surgeon: Royston Cowper, MD;  Location: [facility] ORS;  Service: Urology;  Laterality: N/A;  . EXPLORATORY LAPAROTOMY  1991   VOLVULUS AND BOWEL OBSTRUCTION  . ILIAC ARTERY STENT Bilateral   . TONSILLECTOMY    . TRANSURETHRAL RESECTION OF BLADDER TUMOR N/A 12/29/2015   Procedure: TRANSURETHRAL RESECTION OF BLADDER TUMOR (TURBT)/MITOMYCIN INSTILLATION;  Surgeon: Royston Cowper, MD;  Location: [facility] ORS;  Service: Urology;  Laterality: N/A;  . TRANSURETHRAL RESECTION OF BLADDER TUMOR WITH GYRUS (TURBT-GYRUS)  2013  .  VASECTOMY      There were no vitals filed for this visit.  Subjective Assessment - 01/29/19 1509    Subjective  Pt reports that he is doing alright today. No pain at start of session however pt does continue to complain of some intermittent back pain. He has been inconsistent with his HEP due to low motivation. He reports that he had a fall since he was last in for therapy but didn't hurt himself. No specific questions or concerns at start of session.    Pertinent History  Patient reports his main issue is his balance. States he has a new doctor that refered him to this clinic instead of outpatient in [facility] where he has received physical therapy prior. Has received therapy for balance in the past (approximately 1 year ago). States he has fallen twice recently when he tripped over leg lift of recliner (6 weeks ago), and fell carrying groceries while trying to open door (4 weeks ago). Reports he does have a fear of falling while walking. States he has circulation issues in his legs that has returned and has not told doctor. Completes water aerobics 2-3x a week. Patient states he also has shoulder pain and back pain, and that shoulder hurts is when he raises arm in abduction against resistance and when rolling over in bed. Used to enjoy playing golf. Would like to be able  to walk around the block with his wife.     Limitations  Standing;Walking;House hold activities    How long can you stand comfortably?  5 minutes    How long can you walk comfortably?  5 minutes     Currently in Pain?  No/denies          TREATMENT    Physical Therapy Telehealth Visit:  I connected with Tayvion Lauder today at 80 by [facility] video conference and verified that I am speaking with the correct person using two identifiers.  I discussed the limitations, risks, security and privacy concerns of performing an evaluation and management service by Webex and the availability of in person appointments. I also discussed with  the patient that there may be a patient responsible charge related to this service. The patient expressed understanding and agreed to proceed. Identified to the patient that therapist is a licensed physical therapist in the state of [place].  Other persons participating in the visit and their role in the encounter: Daughter and wife present but not assisting with acounter Patient's location: home Patient's address: [address] [name] [address] (confirmed in case of emergency) Patient's phone #: [phone] (confirmed in case of technical difficulties) Provider's location: [facility] [place] Patient agreed to evaluation/treatment by telemedicine    Ther-Ex Seated marching with green tband around knees 1 minute x 2; Seated clams with green tband around knees 1 minute x 2; Sit to stand without UE support from regular height chair x 10, performed an additional 10 for speed;; Heel raises x 10 with single UE support on chair; Lunges with UE support alternating LE x 1 minute; Mini squats with UE support x 10; Reviewed and reinforced HEP, discussed plan of care moving forward;   Pt educated throughout session about proper posture and technique with exercises. Improved exercise technique, movement at target joints, use of target muscles after min to mod verbal cues.     Pt demonstrates good motivation during session. Session interrupted by 2 technical difficulties and session time reflective of interruptions. Goals updated with pt however unable to assess most objective measures over telemedicine. Pt would like to continue therapy over telemedicine platform and he currently has not reached his maximum potential with respect to improvement. Will request re certification for additional therapy services. Pt encouraged to continue HEP and follow-up next week.Hewill benefit from PT services to address deficits in strength, balance, and mobility in order to return to full  function at home.                        PT Education - 01/30/19 0914    Education provided  Yes    Education Details  Telehealth, plan of care    Person(s) Educated  Patient    Methods  Explanation    Comprehension  Verbalized understanding       PT Short Term Goals - 01/29/19 1517      PT SHORT TERM GOAL #1   Title  Patient will be independent with completion of HEP to improve ability to complete functional tasks.    Baseline  HEP compliant; 11/27/18: Pt only performing 3d/wk    Time  2    Period  Weeks    Status  On-going    Target Date  02/26/19      PT SHORT TERM GOAL #2   Title  Patient will report no falls in the next two  weeks to demonstrate improved safety and fall risk. 11/27/18: Pt fell Saturday morning while he was putting something in the bottom shelf of refrigerator    Baseline  07/09/18: fell 2x in 6 weeks 1/7: no falls; 11/27/18: Pt fell 3 days prior to treatment; 12/20/18: No falls since 11/24/18    Time  2    Period  Weeks    Status  Achieved    Target Date  12/25/18        PT Long Term Goals - 01/30/19 0915      PT LONG TERM GOAL #1   Title  Patient will improve 5x sit to stand to <10seconds without UE support to demonstrate improvements in functional strength.     Baseline  07/09/18: 13sec hands on knees 1/7: 13 seconds occasional hand on knee; 11/27/18: 12.3s no UE support, occasional posterior leaning; 12/20/18: 11.9s with occasional posterior leaning; 01/29/19: Unable to assess over telemedicine    Time  8    Period  Weeks    Status  Partially Met    Target Date  03/26/19      PT LONG TERM GOAL #2   Title  Patient will increase BERG balance score to >52/56 to decrease fall risk and improve balance.     Baseline  07/09/18: 47/56 1/7: 52/56; 11/27/18: 52/56; 12/20/18: 53/56; 01/29/19: Unable to assess over telemedicine    Time  8    Period  Weeks    Status  Achieved      PT LONG TERM GOAL #3   Title  Patient will be able to walk with  wife around neighborhood (>64mnutes) to demonstrate improved balance and community ambulation.     Baseline  07/09/18: 559mutes 1/7: wife just returned; 11/27/18: has not attempted; 12/20/18: has not attempted yet; 01/30/19: Has not attempted yet    Time  6    Period  Weeks    Status  On-going    Target Date  03/26/19      PT LONG TERM GOAL #4   Title  Patient will improve 10374mto >1.74m/64mimiting fall risk and improve community ambulation.     Baseline  07/09/18: 0.833m/33m/7: 1.25 m/s; 11/27/18: 7.9s=1.27 m/s; 12/20/18: Fastest: 7.5s = 1.33 m/s;    Time  6    Period  Weeks    Status  Achieved      PT LONG TERM GOAL #5   Title  Patient will increase DGI to >18/24 to indicate increased stability with dynamic mobility and decrease fall risk.     Baseline  1/7: 14/24; 11/27/18: 17/24; 12/20/18: 20/24;    Time  6    Period  Weeks    Status  Achieved      PT LONG TERM GOAL #6   Title  Pt able to descend 8 steps with single UE assist with no LOB or heel catching on step in order to improve community access and safety on steps.    Baseline  1/7: BUE support and heel catching; 11/27/18: Heel still catching; 12/20/18: Still requires UE assist; 01/29/19: Unable to assess over telemedicine    Time  8    Period  Weeks    Status  On-going    Target Date  03/26/19      PT LONG TERM GOAL #7   Title   Pt will improve DGI by at least 3 points in order to demonstrate clinically significant improvement in balance and decreased risk for falls     Baseline  12/20/18: 20/24; 01/29/19: Unable to assess over telemedicine    Time  8    Period  Weeks    Status  On-going    Target Date  03/26/19      PT LONG TERM GOAL #8   Title  Pt will increase 6MWT by at least 77m(1651f in order to demonstrate clinically significant improvement in cardiopulmonary endurance and community ambulation     Baseline  12/20/18: 1070'; 01/29/19: Unable to assess over telemedicine    Time  8    Period  Weeks    Status  On-going     Target Date  03/26/19            Plan - 01/29/19 1517    Rehab Potential  Good    Clinical Impairments Affecting Rehab Potential  (+) support system (-) age;     PT Frequency  2x / week    PT Duration  8 weeks    PT Treatment/Interventions  ADLs/Self Care Home Management;Energy conservation;Patient/family education;Therapeutic exercise;Balance training;Neuromuscular re-education;Therapeutic activities;Functional mobility training;Stair training;DME Instruction;Gait training;Aquatic Therapy;Cryotherapy;Electrical Stimulation;Iontophoresis 30m52ml Dexamethasone;Moist Heat;Traction;Ultrasound;Taping;Passive range of motion;Vestibular;Manual techniques    PT Next Visit Plan  Review HEP, strength, balance; static balance while engaged in UE task    PT Home Exercise Plan  Semitandem balance, semitandem balance with horizontal head turns, single leg balance, heel/toe raises ([id number]  Consulted and Agree with Plan of Care  Patient       Patient will benefit from skilled therapeutic intervention in order to improve the following deficits and impairments:  Abnormal gait, Decreased activity tolerance, Decreased balance, Decreased coordination, Decreased endurance, Decreased mobility, Decreased strength, Difficulty walking, Impaired sensation, Pain, Impaired UE functional use, Postural dysfunction, Improper body mechanics, Impaired perceived functional ability, Decreased knowledge of use of DME  Visit Diagnosis: Muscle weakness (generalized)  Gait difficulty     Problem List There are no active problems to display for this patient.  Eric Torres, DPT, GCS  Huprich,Jason 01/30/2019, 9:22 AM  ConHewlett NeckIN REHWalnut Hill Surgery CenterRVICES [address] [place]one: [phone]:  [phone]: MauALGIS LEHENBAUERN: [phone] of Birth: 7/3November 22, 1933

## 2019-01-31 ENCOUNTER — Ambulatory Visit: Payer: Medicare Other

## 2019-02-04 ENCOUNTER — Ambulatory Visit
Admission: RE | Admit: 2019-02-04 | Discharge: 2019-02-04 | Disposition: A | Discharge status: Home / Self Care | Payer: Medicare Other | Source: Ambulatory Visit | Attending: Gastroenterology | Admitting: Gastroenterology

## 2019-02-04 ENCOUNTER — Other Ambulatory Visit: Payer: Self-pay

## 2019-02-04 DIAGNOSIS — R1314 Dysphagia, pharyngoesophageal phase: Secondary | ICD-10-CM | POA: Diagnosis present

## 2019-02-05 ENCOUNTER — Ambulatory Visit: Payer: Medicare Other

## 2019-02-06 ENCOUNTER — Other Ambulatory Visit: Payer: Self-pay

## 2019-02-06 ENCOUNTER — Ambulatory Visit: Payer: Medicare Other

## 2019-02-06 DIAGNOSIS — R269 Unspecified abnormalities of gait and mobility: Secondary | ICD-10-CM

## 2019-02-06 DIAGNOSIS — M6281 Muscle weakness (generalized): Secondary | ICD-10-CM

## 2019-02-06 NOTE — Therapy (Signed)
[place] MAIN [facility] SERVICES [address] [place], [place], [address] Phone: [phone]   Fax:  [phone]  Physical Therapy Treatment  Patient Details  Name: Eric Torres MRN: [id number] Date of Birth: [birth year] Referring Provider (PT): Mcneil Sober   Encounter Date: 02/06/2019  PT End of Session - 02/06/19 0935    Visit Number  [id number]    Number of Visits  60    Date for PT Re-Evaluation  03/26/19    Authorization Type  Last goals 12/20/18; addressed via telemedicine 01/29/19    PT Start Time  0930    PT Stop Time  1018    PT Time Calculation (min)  48 min    Equipment Utilized During Treatment  Gait belt    Activity Tolerance  Patient tolerated treatment well    Behavior During Therapy  WFL for tasks assessed/performed       Past Medical History:  Diagnosis Date  . BPH (benign prostatic hyperplasia)   . Cancer [facility])    bladder dec 2013  . COPD (chronic obstructive pulmonary disease) (HCC)    MILD-NO INHALERS PER PT  . Essential tremor   . Heart murmur   . History of brachytherapy   . History of TIA (transient ischemic attack) 2015   LEFT THUMB AND INDEX FINGER-SOMETIMES I DROP THINGS  . Hypercholesteremia   . Hypertension   . Hypothyroidism     Past Surgical History:  Procedure Laterality Date  . CHOLECYSTECTOMY    . CYSTOSCOPY WITH BIOPSY N/A 12/29/2015   Procedure: CYSTOSCOPY WITH BIOPSY;  Surgeon: Royston Cowper, MD;  Location: [facility] ORS;  Service: Urology;  Laterality: N/A;  . EXPLORATORY LAPAROTOMY  1991   VOLVULUS AND BOWEL OBSTRUCTION  . ILIAC ARTERY STENT Bilateral   . TONSILLECTOMY    . TRANSURETHRAL RESECTION OF BLADDER TUMOR N/A 12/29/2015   Procedure: TRANSURETHRAL RESECTION OF BLADDER TUMOR (TURBT)/MITOMYCIN INSTILLATION;  Surgeon: Royston Cowper, MD;  Location: [facility] ORS;  Service: Urology;  Laterality: N/A;  . TRANSURETHRAL RESECTION OF BLADDER TUMOR WITH GYRUS (TURBT-GYRUS)  2013  . VASECTOMY       There were no vitals filed for this visit.  Subjective Assessment - 02/06/19 0934    Subjective  Pt reports that he is doing well today. He complains of 2/10 midline low back pain at start of session which is chronic. No specific questions or concerns at start of session.     Pertinent History  Patient reports his main issue is his balance. States he has a new doctor that refered him to this clinic instead of outpatient in [facility] where he has received physical therapy prior. Has received therapy for balance in the past (approximately 1 year ago). States he has fallen twice recently when he tripped over leg lift of recliner (6 weeks ago), and fell carrying groceries while trying to open door (4 weeks ago). Reports he does have a fear of falling while walking. States he has circulation issues in his legs that has returned and has not told doctor. Completes water aerobics 2-3x a week. Patient states he also has shoulder pain and back pain, and that shoulder hurts is when he raises arm in abduction against resistance and when rolling over in bed. Used to enjoy playing golf. Would like to be able to walk around the block with his wife.     Limitations  Standing;Walking;House hold activities    How long can you stand comfortably?  5 minutes  How long can you walk comfortably?  5 minutes     Currently in Pain?  Yes    Pain Score  2     Pain Location  Back    Pain Orientation  Lower;Medial    Pain Descriptors / Indicators  Aching    Pain Type  Chronic pain    Pain Onset  More than a month ago         TREATMENT    Physical Therapy Telehealth Visit:  I connected with Eric Torres today at [place] by Webex video conference and verified that I am speaking with the correct person using two identifiers.  I discussed the limitations, risks, security and privacy concerns of performing an evaluation and management service by Webex. I also discussed with the patient that there may be a patient  responsible charge related to this service. The patient expressed understanding and agreed to proceed.Identified to the patient that therapist is a licensed physical therapist in the state of [place].  Other persons participating in the visit and their role in the encounter:Daughter and wife present but not assisting with encounter Patient's location:home Patient's address: [address] [name] [address] (confirmed in case of emergency) Patient's phone #: [phone] (confirmed in case of technical difficulties) Provider's location:[facility] main OP rehab gym [place] Patient agreed to evaluation/treatment by telemedicine   Ther-Ex All standing exercises performed with back to empty corner of room for safety; Standing marches without UE support 1 minute x 2; Standing mini squats 30s x 2; Standing heel/toe raises 1 minute x 2; Sit to stand without UE support from regular height as many as possible 30s x 2; Mini Lunges with UE support on chair alternating LE 45 seconds x 2 each side;   Neuromuscular Re-education  All standing balance exercises performed with back to empty corner of room for safety; Feet together (NBOS) eyes closed 30s x 2; NBOS eyes open horizontal head turns x 30s, eyes closed 30s x 2; Semitandem stance eyes closed alternating forward LE 30s x 2; Semitandem stance eyes closed with horizontal head turns alternating forward LE 30s x 2; Single leg balance practice 30s x 2 on each leg;   Pt educated throughout session about proper posture and technique with exercises. Improved exercise technique, movement at target joints, use of target muscles after min to mod verbal cues.    Pt demonstrates good motivation during session. Progressed to balance exercises during session with patient positioned in corner and chair in front to provide 360 degrees of support as needed. Pt demonstrates considerable difficulty with eyes closed while performing head turns in  NBOS and semitandem stance. Pt also struggles with single leg balance. He requires intermittent seated rest breaks during strengthening secondary to fatigue. Encouraged pt to practice head turning balance exercises at home. Pt encouraged to continue HEP and follow-up next week.Hewill benefit from PT services to address deficits in strength, balance, and mobility in order to return to full function at home.                       PT Education - 02/06/19 1019    Education provided  Yes    Education Details  HEP progressions    Person(s) Educated  Patient    Methods  Explanation    Comprehension  Verbalized understanding       PT Short Term Goals - 01/29/19 1517      PT SHORT TERM GOAL #1  Title  Patient will be independent with completion of HEP to improve ability to complete functional tasks.    Baseline  HEP compliant; 11/27/18: Pt only performing 3d/wk    Time  2    Period  Weeks    Status  On-going    Target Date  02/26/19      PT SHORT TERM GOAL #2   Title  Patient will report no falls in the next two weeks to demonstrate improved safety and fall risk. 11/27/18: Pt fell Saturday morning while he was putting something in the bottom shelf of refrigerator    Baseline  07/09/18: fell 2x in 6 weeks 1/7: no falls; 11/27/18: Pt fell 3 days prior to treatment; 12/20/18: No falls since 11/24/18    Time  2    Period  Weeks    Status  Achieved    Target Date  12/25/18        PT Long Term Goals - 01/30/19 0915      PT LONG TERM GOAL #1   Title  Patient will improve 5x sit to stand to <10seconds without UE support to demonstrate improvements in functional strength.     Baseline  07/09/18: 13sec hands on knees 1/7: 13 seconds occasional hand on knee; 11/27/18: 12.3s no UE support, occasional posterior leaning; 12/20/18: 11.9s with occasional posterior leaning; 01/29/19: Unable to assess over telemedicine    Time  8    Period  Weeks    Status  Partially Met    Target Date   03/26/19      PT LONG TERM GOAL #2   Title  Patient will increase BERG balance score to >52/56 to decrease fall risk and improve balance.     Baseline  07/09/18: 47/56 1/7: 52/56; 11/27/18: 52/56; 12/20/18: 53/56; 01/29/19: Unable to assess over telemedicine    Time  8    Period  Weeks    Status  Achieved      PT LONG TERM GOAL #3   Title  Patient will be able to walk with wife around neighborhood (>78mnutes) to demonstrate improved balance and community ambulation.     Baseline  07/09/18: 549mutes 1/7: wife just returned; 11/27/18: has not attempted; 12/20/18: has not attempted yet; 01/30/19: Has not attempted yet    Time  6    Period  Weeks    Status  On-going    Target Date  03/26/19      PT LONG TERM GOAL #4   Title  Patient will improve 1066mto >1.15m/31mimiting fall risk and improve community ambulation.     Baseline  07/09/18: 0.833m/89m/7: 1.25 m/s; 11/27/18: 7.9s=1.27 m/s; 12/20/18: Fastest: 7.5s = 1.33 m/s;    Time  6    Period  Weeks    Status  Achieved      PT LONG TERM GOAL #5   Title  Patient will increase DGI to >18/24 to indicate increased stability with dynamic mobility and decrease fall risk.     Baseline  1/7: 14/24; 11/27/18: 17/24; 12/20/18: 20/24;    Time  6    Period  Weeks    Status  Achieved      PT LONG TERM GOAL #6   Title  Pt able to descend 8 steps with single UE assist with no LOB or heel catching on step in order to improve community access and safety on steps.    Baseline  1/7: BUE support and heel catching; 11/27/18: Heel still catching; 12/20/18: Still requires  UE assist; 01/29/19: Unable to assess over telemedicine    Time  8    Period  Weeks    Status  On-going    Target Date  03/26/19      PT LONG TERM GOAL #7   Title   Pt will improve DGI by at least 3 points in order to demonstrate clinically significant improvement in balance and decreased risk for falls     Baseline  12/20/18: 20/24; 01/29/19: Unable to assess over telemedicine    Time  8    Period   Weeks    Status  On-going    Target Date  03/26/19      PT LONG TERM GOAL #8   Title  Pt will increase 6MWT by at least 32m(1669f in order to demonstrate clinically significant improvement in cardiopulmonary endurance and community ambulation     Baseline  12/20/18: 1070'; 01/29/19: Unable to assess over telemedicine    Time  8    Period  Weeks    Status  On-going    Target Date  03/26/19            Plan - 02/06/19 0935    Clinical Impression Statement  Pt demonstrates good motivation during session. Progressed to balance exercises during session with patient positioned in corner and chair in front to provide 360 degrees of support as needed. Pt demonstrates considerable difficulty with eyes closed while performing head turns in NBOS and semitandem stance. Pt also struggles with single leg balance. He requires intermittent seated rest breaks during strengthening secondary to fatigue. Encouraged pt to practice head turning balance exercises at home. Pt encouraged to continue HEP and follow-up next week.Hewill benefit from PT services to address deficits in strength, balance, and mobility in order to return to full function at home.    Rehab Potential  Good    Clinical Impairments Affecting Rehab Potential  (+) support system (-) age;     PT Frequency  2x / week    PT Duration  8 weeks    PT Treatment/Interventions  ADLs/Self Care Home Management;Energy conservation;Patient/family education;Therapeutic exercise;Balance training;Neuromuscular re-education;Therapeutic activities;Functional mobility training;Stair training;DME Instruction;Gait training;Aquatic Therapy;Cryotherapy;Electrical Stimulation;Iontophoresis 56m37ml Dexamethasone;Moist Heat;Traction;Ultrasound;Taping;Passive range of motion;Vestibular;Manual techniques    PT Next Visit Plan  strength, balance; static balance while engaged in UE task, head turning activities for balance    PT Home Exercise Plan  Semitandem balance,  semitandem balance with horizontal head turns, single leg balance, heel/toe raises ([id number]  Consulted and Agree with Plan of Care  Patient       Patient will benefit from skilled therapeutic intervention in order to improve the following deficits and impairments:  Abnormal gait, Decreased activity tolerance, Decreased balance, Decreased coordination, Decreased endurance, Decreased mobility, Decreased strength, Difficulty walking, Impaired sensation, Pain, Impaired UE functional use, Postural dysfunction, Improper body mechanics, Impaired perceived functional ability, Decreased knowledge of use of DME  Visit Diagnosis: Muscle weakness (generalized)  Gait difficulty     Problem List There are no active problems to display for this patient.  JasPhillips Grout, DPT, GCS  [place],Eric Torres 02/06/2019, 10:22 AM  [facility] LoganIN REHMoab [facility] HospitalRVICES [address] [place]one: [phone]:  [phone]: MauDAKING WESTERVELTN: [phone] of Birth: [birth year]

## 2019-02-13 ENCOUNTER — Other Ambulatory Visit: Payer: Self-pay

## 2019-02-13 ENCOUNTER — Ambulatory Visit: Payer: Medicare Other | Attending: Family Medicine

## 2019-02-13 ENCOUNTER — Ambulatory Visit: Payer: Medicare Other

## 2019-02-13 ENCOUNTER — Encounter: Payer: Self-pay | Admitting: *Deleted

## 2019-02-13 DIAGNOSIS — R269 Unspecified abnormalities of gait and mobility: Secondary | ICD-10-CM | POA: Diagnosis present

## 2019-02-13 DIAGNOSIS — M6281 Muscle weakness (generalized): Secondary | ICD-10-CM | POA: Insufficient documentation

## 2019-02-13 DIAGNOSIS — R2681 Unsteadiness on feet: Secondary | ICD-10-CM | POA: Insufficient documentation

## 2019-02-13 NOTE — Therapy (Signed)
[place] MAIN [facility] SERVICES [address] [place], [place], [address] Phone: [phone]   Fax:  [phone]  Physical Therapy Treatment  Patient Details  Name: Eric Torres MRN: [id number] Date of Birth: Apr 29, 1932 Referring Provider (PT): Mcneil Sober   Encounter Date: 02/13/2019  PT End of Session - 02/13/19 0931    Visit Number  35    Number of Visits  60    Date for PT Re-Evaluation  03/26/19    Authorization Type  Last goals 12/20/18; addressed via telemedicine 01/29/19    PT Start Time  0928    PT Stop Time  1015    PT Time Calculation (min)  47 min    Equipment Utilized During Treatment  Gait belt    Activity Tolerance  Patient tolerated treatment well    Behavior During Therapy  [facility] for tasks assessed/performed       Past Medical History:  Diagnosis Date  . BPH (benign prostatic hyperplasia)   . 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 COPD (chronic obstructive pulmonary disease) (HCC)    MILD-NO INHALERS PER PT  . Essential tremor   . Heart murmur   . History of brachytherapy   . History of TIA (transient ischemic attack) 2015   LEFT THUMB AND INDEX FINGER-SOMETIMES I DROP THINGS  . Hypercholesteremia   . Hypertension   . Hypothyroidism     Past Surgical History:  Procedure Laterality Date  . CHOLECYSTECTOMY    . CYSTOSCOPY WITH BIOPSY N/A 12/29/2015   Procedure: CYSTOSCOPY WITH BIOPSY;  Surgeon: Royston Cowper, MD;  Location: [facility] ORS;  Service: Urology;  Laterality: N/A;  . EXPLORATORY LAPAROTOMY  1991   VOLVULUS AND BOWEL OBSTRUCTION  . ILIAC ARTERY STENT Bilateral   . TONSILLECTOMY    . TRANSURETHRAL RESECTION OF BLADDER TUMOR N/A 12/29/2015   Procedure: TRANSURETHRAL RESECTION OF BLADDER TUMOR (TURBT)/MITOMYCIN INSTILLATION;  Surgeon: Royston Cowper, MD;  Location: [facility] ORS;  Service: Urology;  Laterality: N/A;  . TRANSURETHRAL RESECTION OF BLADDER TUMOR WITH GYRUS (TURBT-GYRUS)  2013  . VASECTOMY       There were no vitals filed for this visit.  Subjective Assessment - 02/13/19 0930    Subjective  Pt reports that he is doing well today. He complains of 2/10 midline low back pain at start of session which is chronic. He walked twice yesterday outside. No falls since the last session. No specific questions or concerns at start of session.     Pertinent History  Patient reports his main issue is his balance. States he has a new doctor that refered him to this clinic instead of outpatient in [facility] where he has received physical therapy prior. Has received therapy for balance in the past (approximately 1 year ago). States he has fallen twice recently when he tripped over leg lift of recliner (6 weeks ago), and fell carrying groceries while trying to open door (4 weeks ago). Reports he does have a fear of falling while walking. States he has circulation issues in his legs that has returned and has not told doctor. Completes water aerobics 2-3x a week. Patient states he also has shoulder pain and back pain, and that shoulder hurts is when he raises arm in abduction against resistance and when rolling over in bed. Used to enjoy playing golf. Would like to be able to walk around the block with his wife.     Limitations  Standing;Walking;House hold activities  How long can you stand comfortably?  5 minutes    How long can you walk comfortably?  5 minutes     Currently in Pain?  Yes    Pain Score  2     Pain Location  Back    Pain Orientation  Medial;Lower    Pain Descriptors / Indicators  Aching    Pain Type  Chronic pain    Pain Onset  More than a month ago    Pain Frequency  Intermittent         TREATMENT   Physical Therapy Telehealth Visit:  I connected withMaurice Borentoday at [id number] Webex video conference and verified that I am speaking with the correct person using two identifiers. I discussed the limitations, risks, security and privacy concerns of performing an evaluation  and management service by Webex. I also discussed with the patient that there may be a patient responsible charge related to this service. The patient expressed understanding and agreed to proceed.Identified to the patient that therapist is a licensed physical therapist in the state of [place].  Other persons participating in the visit and their role in the encounter:Daughter and wife present but not assisting with encounter Patient's location:home Patient's address:[address] [name] [address](confirmed in case of emergency) Patient's phone #[phone](confirmed in case of technical difficulties) Provider's location:[facility] main OP rehab gym [facility]  Patient agreed to evaluation/treatment by telemedicine   Ther-Ex All standing exercises performed with back to empty corner of room for safety; Standing marches without UE support 1 minute x 2; Standing mini squats 1 minute x 2; Standing heel/toe raises 1 minute x 2; Mini Lungeswith UE support on chairalternating LE x 1 minute on each side; Lateral lunges with UE support on chair alternating LE x 1 minute toward each side;  Sit to stand without UE support from regular height as many as possible 30s x 2;   Neuromuscular Re-education  All standing balance exercises performed with back to empty corner of room for safety; Feet together (NBOS) eyes closed x 1 minute; NBOS eyes closed horizontal and vertical head turns x 1 minute each; Semitandem stance eyes closed alternating forward LE x 1 minute each; Tandem balance eyes open alternating forward LE x 1 minute each; Single leg balance practice x 30s on each leg;   Pt educated throughout session about proper posture and technique with exercises. Improved exercise technique, movement at target joints, use of target muscles after min to mod verbal cues.   Pt demonstrates good motivation during session. Continued to progress balance exercises during session  with patient positioned in corner and chair in front to provide 360 degrees of support as needed. Pt continues to demonstrate considerable difficulty with eyes closed while performing head turns in NBOS. Pt also struggles with single leg balance but appears more stable today compared to last session. He requires intermittent seated rest breaks during strengthening secondary to fatigue. Pt encouraged to continue HEP and follow-upnext week.Hewill benefit from PT services to address deficits in strength, balance, and mobility in order to return to full function at home.                         PT Short Term Goals - 01/29/19 1517      PT SHORT TERM GOAL #1   Title  Patient will be independent with completion of HEP to improve ability to complete functional tasks.    Baseline  HEP compliant; 11/27/18: Pt  only performing 3d/wk    Time  2    Period  Weeks    Status  On-going    Target Date  02/26/19      PT SHORT TERM GOAL #2   Title  Patient will report no falls in the next two weeks to demonstrate improved safety and fall risk. 11/27/18: Pt fell Saturday morning while he was putting something in the bottom shelf of refrigerator    Baseline  07/09/18: fell 2x in 6 weeks 1/7: no falls; 11/27/18: Pt fell 3 days prior to treatment; 12/20/18: No falls since 11/24/18    Time  2    Period  Weeks    Status  Achieved    Target Date  12/25/18        PT Long Term Goals - 01/30/19 0915      PT LONG TERM GOAL #1   Title  Patient will improve 5x sit to stand to <10seconds without UE support to demonstrate improvements in functional strength.     Baseline  07/09/18: 13sec hands on knees 1/7: 13 seconds occasional hand on knee; 11/27/18: 12.3s no UE support, occasional posterior leaning; 12/20/18: 11.9s with occasional posterior leaning; 01/29/19: Unable to assess over telemedicine    Time  8    Period  Weeks    Status  Partially Met    Target Date  03/26/19      PT LONG TERM GOAL #2    Title  Patient will increase BERG balance score to >52/56 to decrease fall risk and improve balance.     Baseline  07/09/18: 47/56 1/7: 52/56; 11/27/18: 52/56; 12/20/18: 53/56; 01/29/19: Unable to assess over telemedicine    Time  8    Period  Weeks    Status  Achieved      PT LONG TERM GOAL #3   Title  Patient will be able to walk with wife around neighborhood (>73mnutes) to demonstrate improved balance and community ambulation.     Baseline  07/09/18: 539mutes 1/7: wife just returned; 11/27/18: has not attempted; 12/20/18: has not attempted yet; 01/30/19: Has not attempted yet    Time  6    Period  Weeks    Status  On-going    Target Date  03/26/19      PT LONG TERM GOAL #4   Title  Patient will improve 1064mto >1.62m/23mimiting fall risk and improve community ambulation.     Baseline  07/09/18: 0.833m/76m/7: 1.25 m/s; 11/27/18: 7.9s=1.27 m/s; 12/20/18: Fastest: 7.5s = 1.33 m/s;    Time  6    Period  Weeks    Status  Achieved      PT LONG TERM GOAL #5   Title  Patient will increase DGI to >18/24 to indicate increased stability with dynamic mobility and decrease fall risk.     Baseline  1/7: 14/24; 11/27/18: 17/24; 12/20/18: 20/24;    Time  6    Period  Weeks    Status  Achieved      PT LONG TERM GOAL #6   Title  Pt able to descend 8 steps with single UE assist with no LOB or heel catching on step in order to improve community access and safety on steps.    Baseline  1/7: BUE support and heel catching; 11/27/18: Heel still catching; 12/20/18: Still requires UE assist; 01/29/19: Unable to assess over telemedicine    Time  8    Period  Weeks    Status  On-going  Target Date  03/26/19      PT LONG TERM GOAL #7   Title   Pt will improve DGI by at least 3 points in order to demonstrate clinically significant improvement in balance and decreased risk for falls     Baseline  12/20/18: 20/24; 01/29/19: Unable to assess over telemedicine    Time  8    Period  Weeks    Status  On-going    Target  Date  03/26/19      PT LONG TERM GOAL #8   Title  Pt will increase 6MWT by at least 8m(1623f in order to demonstrate clinically significant improvement in cardiopulmonary endurance and community ambulation     Baseline  12/20/18: 1070'; 01/29/19: Unable to assess over telemedicine    Time  8    Period  Weeks    Status  On-going    Target Date  03/26/19            Plan - 02/13/19 0932    Clinical Impression Statement  Pt demonstrates good motivation during session. Continued to progress balance exercises during session with patient positioned in corner and chair in front to provide 360 degrees of support as needed. Pt continues to demonstrate considerable difficulty with eyes closed while performing head turns in NBOS. Pt also struggles with single leg balance but appears more stable today compared to last session. He requires intermittent seated rest breaks during strengthening secondary to fatigue. Pt encouraged to continue HEP and follow-upnext week.Hewill benefit from PT services to address deficits in strength, balance, and mobility in order to return to full function at home.    Stability/Clinical Decision Making  Stable/Uncomplicated    Clinical Decision Making  Low    Rehab Potential  Good    Clinical Impairments Affecting Rehab Potential  (+) support system (-) age;     PT Frequency  2x / week    PT Duration  8 weeks    PT Treatment/Interventions  ADLs/Self Care Home Management;Energy conservation;Patient/family education;Therapeutic exercise;Balance training;Neuromuscular re-education;Therapeutic activities;Functional mobility training;Stair training;DME Instruction;Gait training;Aquatic Therapy;Cryotherapy;Electrical Stimulation;Iontophoresis 1m62ml Dexamethasone;Moist Heat;Traction;Ultrasound;Taping;Passive range of motion;Vestibular;Manual techniques    PT Next Visit Plan  strength, balance; static balance while engaged in UE task, head turning activities for balance    PT  Home Exercise Plan  Semitandem balance, semitandem balance with horizontal head turns, single leg balance, heel/toe raises ([id number]  Consulted and Agree with Plan of Care  Patient       Patient will benefit from skilled therapeutic intervention in order to improve the following deficits and impairments:  Abnormal gait, Decreased activity tolerance, Decreased balance, Decreased coordination, Decreased endurance, Decreased mobility, Decreased strength, Difficulty walking, Impaired sensation, Pain, Impaired UE functional use, Postural dysfunction, Improper body mechanics, Impaired perceived functional ability, Decreased knowledge of use of DME  Visit Diagnosis: Muscle weakness (generalized)  Gait difficulty     Problem List There are no active problems to display for this patient.  JasPhillips Grout, DPT, GCS  Ra Pfiester 02/13/2019, 10:38 AM  ConEmp[facility] CityIN REHHea [facility] PLL[facility]a Surgery CenterRVICES [address] [place]one: [phone]:  [phone]: MauMILLS MITTONN: [id number] of Birth: 7/302-07-1932

## 2019-02-15 ENCOUNTER — Other Ambulatory Visit: Payer: Self-pay

## 2019-02-15 ENCOUNTER — Ambulatory Visit
Admission: RE | Admit: 2019-02-15 | Discharge: 2019-02-15 | Disposition: A | Discharge status: Home / Self Care | Payer: Medicare Other | Attending: Gastroenterology | Admitting: Gastroenterology

## 2019-02-15 ENCOUNTER — Encounter
Admission: RE | Disposition: A | Discharge status: Home / Self Care | Payer: Self-pay | Source: Home / Self Care | Attending: Gastroenterology

## 2019-02-15 ENCOUNTER — Encounter: Payer: Self-pay | Admitting: *Deleted

## 2019-02-15 ENCOUNTER — Ambulatory Visit: Payer: Medicare Other | Admitting: Anesthesiology

## 2019-02-15 DIAGNOSIS — Z8546 Personal history of malignant neoplasm of prostate: Secondary | ICD-10-CM | POA: Diagnosis not present

## 2019-02-15 DIAGNOSIS — Z791 Long term (current) use of non-steroidal anti-inflammatories (NSAID): Secondary | ICD-10-CM | POA: Diagnosis not present

## 2019-02-15 DIAGNOSIS — I10 Essential (primary) hypertension: Secondary | ICD-10-CM | POA: Diagnosis not present

## 2019-02-15 DIAGNOSIS — R1310 Dysphagia, unspecified: Secondary | ICD-10-CM | POA: Diagnosis present

## 2019-02-15 DIAGNOSIS — K210 Gastro-esophageal reflux disease with esophagitis: Secondary | ICD-10-CM | POA: Insufficient documentation

## 2019-02-15 DIAGNOSIS — N400 Enlarged prostate without lower urinary tract symptoms: Secondary | ICD-10-CM | POA: Diagnosis not present

## 2019-02-15 DIAGNOSIS — I739 Peripheral vascular disease, unspecified: Secondary | ICD-10-CM | POA: Insufficient documentation

## 2019-02-15 DIAGNOSIS — K2960 Other gastritis without bleeding: Secondary | ICD-10-CM | POA: Diagnosis not present

## 2019-02-15 DIAGNOSIS — K228 Other specified diseases of esophagus: Secondary | ICD-10-CM | POA: Insufficient documentation

## 2019-02-15 DIAGNOSIS — Z8673 Personal history of transient ischemic attack (TIA), and cerebral infarction without residual deficits: Secondary | ICD-10-CM | POA: Diagnosis not present

## 2019-02-15 DIAGNOSIS — Z7982 Long term (current) use of aspirin: Secondary | ICD-10-CM | POA: Insufficient documentation

## 2019-02-15 DIAGNOSIS — K224 Dyskinesia of esophagus: Secondary | ICD-10-CM | POA: Diagnosis not present

## 2019-02-15 DIAGNOSIS — E7800 Pure hypercholesterolemia, unspecified: Secondary | ICD-10-CM | POA: Diagnosis not present

## 2019-02-15 DIAGNOSIS — Z79899 Other long term (current) drug therapy: Secondary | ICD-10-CM | POA: Diagnosis not present

## 2019-02-15 DIAGNOSIS — G250 Essential tremor: Secondary | ICD-10-CM | POA: Diagnosis not present

## 2019-02-15 DIAGNOSIS — J449 Chronic obstructive pulmonary disease, unspecified: Secondary | ICD-10-CM | POA: Insufficient documentation

## 2019-02-15 DIAGNOSIS — Z8551 Personal history of malignant neoplasm of bladder: Secondary | ICD-10-CM | POA: Insufficient documentation

## 2019-02-15 DIAGNOSIS — E039 Hypothyroidism, unspecified: Secondary | ICD-10-CM | POA: Diagnosis not present

## 2019-02-15 DIAGNOSIS — R011 Cardiac murmur, unspecified: Secondary | ICD-10-CM | POA: Diagnosis not present

## 2019-02-15 DIAGNOSIS — E559 Vitamin D deficiency, unspecified: Secondary | ICD-10-CM | POA: Insufficient documentation

## 2019-02-15 HISTORY — PX: ESOPHAGOGASTRODUODENOSCOPY (EGD) WITH PROPOFOL: SHX5813

## 2019-02-15 HISTORY — DX: Pain in right foot: M79.671

## 2019-02-15 HISTORY — DX: Vitamin D deficiency, unspecified: E55.9

## 2019-02-15 HISTORY — DX: Repeated falls: R29.6

## 2019-02-15 HISTORY — DX: Peripheral vascular disease, unspecified: I73.9

## 2019-02-15 HISTORY — DX: Collagenous colitis: K52.831

## 2019-02-15 HISTORY — DX: Cerebral infarction, unspecified: I63.9

## 2019-02-15 HISTORY — DX: Pain in right foot: M79.672

## 2019-02-15 SURGERY — ESOPHAGOGASTRODUODENOSCOPY (EGD) WITH PROPOFOL
Anesthesia: General

## 2019-02-15 MED ORDER — PROPOFOL 500 MG/50ML IV EMUL
INTRAVENOUS | Status: DC | PRN
  Administered 2019-02-15: 100 ug/kg/min via INTRAVENOUS

## 2019-02-15 MED ORDER — LIDOCAINE HCL (PF) 2 % IJ SOLN
INTRAMUSCULAR | Status: DC | PRN
  Administered 2019-02-15: 100 mg via INTRADERMAL

## 2019-02-15 MED ORDER — SODIUM CHLORIDE 0.9 % IV SOLN: INTRAVENOUS | Status: DC

## 2019-02-15 MED ORDER — SODIUM CHLORIDE 0.9 % IV SOLN
INTRAVENOUS | Status: DC
  Administered 2019-02-15: 1000 mL via INTRAVENOUS
  Administered 2019-02-15: 09:00:00 via INTRAVENOUS

## 2019-02-15 MED ORDER — PROPOFOL 10 MG/ML IV BOLUS
INTRAVENOUS | Status: DC | PRN
  Administered 2019-02-15: 50 mg via INTRAVENOUS

## 2019-02-15 NOTE — Transfer of Care (Signed)
Immediate Anesthesia Transfer of Care Note  Patient: Renell Allum Ontiveros  Procedure(s) Performed: ESOPHAGOGASTRODUODENOSCOPY (EGD) WITH PROPOFOL (N/A )  Patient Location: PACU  Anesthesia Type:General  Level of Consciousness: sedated  Airway & Oxygen Therapy: Patient Spontanous Breathing and Patient connected to nasal cannula oxygen  Post-op Assessment: Report given to RN and Post -op Vital signs reviewed and stable  Post vital signs: Reviewed and stable  Last Vitals:  Vitals Value Taken Time  BP    Temp    Pulse 51 02/15/2019  9:15 AM  Resp 15 02/15/2019  9:15 AM  SpO2 99 % 02/15/2019  9:15 AM  Vitals shown include unvalidated device data.  Last Pain:  Vitals:   02/15/19 0803  TempSrc: Oral  PainSc: 0-No pain         Complications: No apparent anesthesia complications

## 2019-02-15 NOTE — Anesthesia Postprocedure Evaluation (Signed)
Anesthesia Post Note  Patient: Eric Torres  Procedure(s) Performed: ESOPHAGOGASTRODUODENOSCOPY (EGD) WITH PROPOFOL (N/A )  Patient location during evaluation: Endoscopy Anesthesia Type: General Level of consciousness: awake and alert and oriented Pain management: pain level controlled Vital Signs Assessment: post-procedure vital signs reviewed and stable Respiratory status: spontaneous breathing, nonlabored ventilation and respiratory function stable Cardiovascular status: blood pressure returned to baseline and stable Postop Assessment: no signs of nausea or vomiting Anesthetic complications: no     Last Vitals:  Vitals:   02/15/19 0930 02/15/19 0940  BP: (!) 117/52 118/66  Pulse: (!) 53 (!) 49  Resp: 14 15  Temp:    SpO2: 98% 100%    Last Pain:  Vitals:   02/15/19 0915  TempSrc: Tympanic  PainSc:                  Aamira Bischoff

## 2019-02-15 NOTE — Anesthesia Preprocedure Evaluation (Signed)
Anesthesia Evaluation  Patient identified by MRN, date of birth, ID band Patient awake    Reviewed: Allergy & Precautions, NPO status , Patient's Chart, lab work & pertinent test results  History of Anesthesia Complications Negative for: history of anesthetic complications  Airway Mallampati: III  TM Distance: >3 FB Neck ROM: Full    Dental  (+) Poor Dentition   Pulmonary neg sleep apnea, COPD, former smoker,    breath sounds clear to auscultation- rhonchi (-) wheezing      Cardiovascular hypertension, Pt. on medications + Peripheral Vascular Disease  (-) CAD, (-) Past MI, (-) Cardiac Stents and (-) CABG  Rhythm:Regular Rate:Normal - Systolic murmurs and - Diastolic murmurs    Neuro/Psych CVA negative psych ROS   GI/Hepatic negative GI ROS, Neg liver ROS,   Endo/Other  neg diabetesHypothyroidism   Renal/GU negative Renal ROS     Musculoskeletal negative musculoskeletal ROS (+)   Abdominal (+) - obese,   Peds  Hematology negative hematology ROS (+)   Anesthesia Other Findings Past Medical History: No date: Bilateral foot pain     Comment:  LIKELY NEUROPATHY No date: BPH (benign prostatic hyperplasia) No date: Cancer (HCC)     Comment:  ,HX OF PROSTATE CANCER AND BLADDER CANCER No date: Collagenous colitis No date: COPD (chronic obstructive pulmonary disease) (HCC)     Comment:  MILD-NO INHALERS PER PT No date: Essential tremor No date: Frequent falls No date: Heart murmur No date: History of brachytherapy 2015: History of TIA (transient ischemic attack)     Comment:  LEFT THUMB AND INDEX FINGER-SOMETIMES I DROP THINGS No date: Hypercholesteremia No date: Hypertension No date: Hypothyroidism No date: PVD (peripheral vascular disease) (HCC) No date: Stroke ([place]) No date: Vitamin D deficiency   Reproductive/Obstetrics                             Anesthesia Physical Anesthesia  Plan  ASA: III  Anesthesia Plan: General   Post-op Pain Management:    Induction: Intravenous  PONV Risk Score and Plan: 1 and Propofol infusion  Airway Management Planned: Natural Airway  Additional Equipment:   Intra-op Plan:   Post-operative Plan:   Informed Consent: I have reviewed the patients History and Physical, chart, labs and discussed the procedure including the risks, benefits and alternatives for the proposed anesthesia with the patient or authorized representative who has indicated his/her understanding and acceptance.     Dental advisory given  Plan Discussed with: CRNA and Anesthesiologist  Anesthesia Plan Comments:         Anesthesia Quick Evaluation

## 2019-02-15 NOTE — H&P (Signed)
Outpatient short stay form Pre-procedure 02/15/2019 8:33 AM Eric Sails MD  Primary Physician: Eric Richards, MD  Reason for visit: EGD  History of present illness: Patient is a 85 year old male presenting today for an EGD in regards to abnormal barium swallow and complaint of dysphagia.  He has had this issue for at least a year.  He does not regurgitate foods although foods are very difficult to go down at times.  His barium swallow was done on 02/04/2019.  There is a small volume of silent aspiration noted.  There is however severe persistent esophageal dysmotility noted with all swallows.  The GE sphincter is not clearly observed to relax however the swallowed barium tablet passed readily into the stomach.  They stated lower esophageal mass is not excluded consider endoscopic evaluation.  States that his issue is more with softer foods so sounds like perhaps drier foods as well.  There is no pain with swallowing there is no typical/atypical chest pain.  Patient takes 81 mg aspirin the last time taken was yesterday.  He takes no other aspirin products or blood thinning agent.    Current Facility-Administered Medications:  .  0.9 %  sodium chloride infusion, , Intravenous, Continuous, Eric Sails, MD .  0.9 %  sodium chloride infusion, , Intravenous, Continuous, Eric Sails, MD  Medications Prior to Admission  Medication Sig Dispense Refill Last Dose  . aspirin 81 MG tablet Take 81 mg by mouth daily.   02/14/2019 at 0800  . atorvastatin (LIPITOR) 80 MG tablet Take 80 mg by mouth daily at 6 PM.   02/14/2019 at Unknown time  . finasteride (PROSCAR) 5 MG tablet Take 5 mg by mouth at bedtime.   02/14/2019 at Unknown time  . gabapentin (NEURONTIN) 100 MG capsule Take 100 mg by mouth 2 (two) times daily.   02/14/2019 at Unknown time  . primidone (MYSOLINE) 250 MG tablet Take 250 mg by mouth 2 (two) times daily with a meal.   02/14/2019 at Unknown time  . propranolol (INDERAL) 40 MG  tablet Take 40 mg by mouth 2 (two) times daily.   02/14/2019 at Unknown time  . tamsulosin (FLOMAX) 0.4 MG CAPS capsule Take 0.4 mg by mouth daily after supper.   02/14/2019 at Unknown time  . atenolol (TENORMIN) 50 MG tablet Take 50 mg by mouth every morning.   Not Taking at Unknown time  . levothyroxine (SYNTHROID, LEVOTHROID) 112 MCG tablet Take 112 mcg by mouth at bedtime. MIDNIGHT   12/28/2015 at Unknown time  . meloxicam (MOBIC) 7.5 MG tablet Take 7.5 mg by mouth daily.   12/24/2015  . Methen-Hyosc-Meth Blue-Na Phos (UROGESIC-BLUE) 81.6 MG TABS Take 1 tablet (81.6 mg total) by mouth every 6 (six) hours as needed (dysuria). (Patient not taking: Reported on 02/15/2019) 40 tablet 3 Not Taking at Unknown time  . Nutritional Supplements (BLADDER 2.2) TABS Take 1 tablet by mouth daily.   12/28/2015 at Unknown time  . VITAMIN D, CHOLECALCIFEROL, PO Take 1 tablet by mouth daily.   12/28/2015 at Unknown time     No Known Allergies   Past Medical History:  Diagnosis Date  . Bilateral foot pain    LIKELY NEUROPATHY  . BPH (benign prostatic hyperplasia)   . Cancer (HCC)    ,HX OF PROSTATE CANCER AND BLADDER CANCER  . Collagenous colitis   . COPD (chronic obstructive pulmonary disease) (HCC)    MILD-NO INHALERS PER PT  . Essential tremor   . Frequent falls   .  Heart murmur   . History of brachytherapy   . History of TIA (transient ischemic attack) 2015   LEFT THUMB AND INDEX FINGER-SOMETIMES I DROP THINGS  . Hypercholesteremia   . Hypertension   . Hypothyroidism   . PVD (peripheral vascular disease) ([place])   . Stroke ([place])   . Vitamin D deficiency     Review of systems:      Physical Exam    Heart and lungs: Regular rate and rhythm without rub or gallop, lungs are bilaterally clear.    HEENT: Normocephalic atraumatic eyes are anicteric    Other:    Pertinant exam for procedure: Soft nontender nondistended bowel sounds positive normoactive.    Planned proceedures: EGD and indicated  procedures. I have discussed the risks benefits and complications of procedures to include not limited to bleeding, infection, perforation and the risk of sedation and the patient wishes to proceed.    Eric Sails, MD Gastroenterology 02/15/2019  8:33 AM

## 2019-02-15 NOTE — Op Note (Signed)
[facility] Patient Name: Eric Torres Procedure Date: 02/15/2019 8:51 AM MRN: [id number] Account #: [id number] Date of Birth: 1931-12-03 Admit Type: Outpatient Age: 85 Room: [facility] ENDO ROOM 4 Gender: Male Note Status: Finalized Procedure:            Upper GI endoscopy Indications:          Dysphagia Providers:            Lollie Sails, MD Referring MD:         Ocie Cornfield. Ouida Sills MD, MD (Referring MD) Medicines:            Monitored Anesthesia Care Complications:        No immediate complications. Procedure:            Pre-Anesthesia Assessment:                       - ASA Grade Assessment: III - A patient with severe                        systemic disease.                       After obtaining informed consent, the endoscope was                        passed under direct vision. Throughout the procedure,                        the patient's blood pressure, pulse, and oxygen                        saturations were monitored continuously. The Endoscope                        was introduced through the mouth, and advanced to the                        third part of duodenum. The upper GI endoscopy was                        accomplished without difficulty. The patient tolerated                        the procedure well. Findings:      Abnormal motility was noted in the middle third of the esophagus and in       the lower third of the esophagus. The cricopharyngeus was normal. There       is spasticity and extra peristaltic waves of the esophageal body. The       distal esophagus/lower esophageal sphincter is spastic, but gives up       passage to the endoscope. Tertiary peristaltic waves are noted.      LA Grade A (one or more mucosal breaks less than 5 mm, not extending       between tops of 2 mucosal folds) esophagitis with no bleeding was found.       Biopsies were taken with a cold forceps for histology.      Patchy minimal inflammation  characterized by congestion (edema) and       erythema was found in the gastric antrum. Biopsies were taken with a  cold forceps for histology.      The cardia and gastric fundus were normal on retroflexion.      The examined duodenum was normal. Impression:           - Abnormal esophageal motility, consistent with                        presbyesophagus.                       - LA Grade A esophagitis. Biopsied.                       - Erosive gastritis. Biopsied.                       - Normal examined duodenum. Recommendation:       - Use Protonix (pantoprazole) 40 mg PO daily daily.                       - Await pathology results.                       - Return to GI clinic in 4 weeks. Procedure Code(s):    --- Professional ---                       [phone], Esophagogastroduodenoscopy, flexible, transoral;                        with biopsy, single or multiple Diagnosis Code(s):    --- Professional ---                       K22.4, Dyskinesia of esophagus                       K20.9, Esophagitis, unspecified                       K29.60, Other gastritis without bleeding                       R13.10, Dysphagia, unspecified CPT copyright 2019 American Medical Association. All rights reserved. The codes documented in this report are preliminary and upon coder review may  be revised to meet current compliance requirements. Lollie Sails, MD 02/15/2019 9:15:15 AM This report has been signed electronically. Number of Addenda: 0 Note Initiated On: 02/15/2019 8:51 AM      [facility]

## 2019-02-15 NOTE — Anesthesia Post-op Follow-up Note (Signed)
Anesthesia QCDR form completed.        

## 2019-02-15 NOTE — Anesthesia Procedure Notes (Signed)
Date/Time: 02/15/2019 8:59 AM Performed by: Nelda Marseille, CRNA Pre-anesthesia Checklist: Patient identified, Emergency Drugs available, Suction available, Patient being monitored and Timeout performed Oxygen Delivery Method: Nasal cannula

## 2019-02-18 ENCOUNTER — Encounter: Payer: Self-pay | Admitting: Gastroenterology

## 2019-02-18 LAB — SURGICAL PATHOLOGY

## 2019-02-21 ENCOUNTER — Other Ambulatory Visit: Payer: Self-pay

## 2019-02-21 ENCOUNTER — Ambulatory Visit: Payer: Medicare Other

## 2019-02-21 DIAGNOSIS — R2681 Unsteadiness on feet: Secondary | ICD-10-CM

## 2019-02-21 DIAGNOSIS — R269 Unspecified abnormalities of gait and mobility: Secondary | ICD-10-CM

## 2019-02-21 DIAGNOSIS — M6281 Muscle weakness (generalized): Secondary | ICD-10-CM

## 2019-02-21 NOTE — Therapy (Signed)
[place] MAIN [facility] SERVICES [address] [place], [place], [address] Phone: [phone]   Fax:  [phone]  Physical Therapy Treatment  Patient Details  Name: Eric Torres MRN: [id number] Date of Birth: 03/31/1932 Referring Provider (PT): Mcneil Sober   Encounter Date: 02/21/2019  PT End of Session - 02/21/19 1024    Visit Number  36    Number of Visits  60    Date for PT Re-Evaluation  03/26/19    Authorization Type  Last goals 12/20/18; addressed via telemedicine 01/29/19    PT Start Time  1014    PT Stop Time  1050    PT Time Calculation (min)  36 min    Equipment Utilized During Treatment  Gait belt    Activity Tolerance  Patient tolerated treatment well    Behavior During Therapy  WFL for tasks assessed/performed       Past Medical History:  Diagnosis Date  . Bilateral foot pain    LIKELY NEUROPATHY  . BPH (benign prostatic hyperplasia)   . Cancer (HCC)    ,HX OF PROSTATE CANCER AND BLADDER CANCER  . Collagenous colitis   . COPD (chronic obstructive pulmonary disease) (HCC)    MILD-NO INHALERS PER PT  . Essential tremor   . Frequent falls   . Heart murmur   . History of brachytherapy   . History of TIA (transient ischemic attack) 2015   LEFT THUMB AND INDEX FINGER-SOMETIMES I DROP THINGS  . Hypercholesteremia   . Hypertension   . Hypothyroidism   . PVD (peripheral vascular disease) ([place])   . Stroke ([place])   . Vitamin D deficiency     Past Surgical History:  Procedure Laterality Date  . CHOLECYSTECTOMY    . CYSTOSCOPY WITH BIOPSY N/A 12/29/2015   Procedure: CYSTOSCOPY WITH BIOPSY;  Surgeon: Royston Cowper, MD;  Location: [facility] ORS;  Service: Urology;  Laterality: N/A;  . DIAGNOSTIC LAPAROSCOPY    . ESOPHAGOGASTRODUODENOSCOPY (EGD) WITH PROPOFOL N/A 02/15/2019   Procedure: ESOPHAGOGASTRODUODENOSCOPY (EGD) WITH PROPOFOL;  Surgeon: Lollie Sails, MD;  Location: [facility] ENDOSCOPY;  Service: Endoscopy;  Laterality:  N/A;  . EXPLORATORY LAPAROTOMY  1991   VOLVULUS AND BOWEL OBSTRUCTION  . HERNIA REPAIR    . ILIAC ARTERY STENT Bilateral   . TONSILLECTOMY    . TRANSURETHRAL RESECTION OF BLADDER TUMOR N/A 12/29/2015   Procedure: TRANSURETHRAL RESECTION OF BLADDER TUMOR (TURBT)/MITOMYCIN INSTILLATION;  Surgeon: Royston Cowper, MD;  Location: [facility] ORS;  Service: Urology;  Laterality: N/A;  . TRANSURETHRAL RESECTION OF BLADDER TUMOR WITH GYRUS (TURBT-GYRUS)  2013  . VASECTOMY      There were no vitals filed for this visit.  Subjective Assessment - 02/21/19 1014    Subjective  Pt reports that he is doing well today. He denies any low back pain upon arrival today. He had his EGD last week but has not received his formal results. He has remained active at home and has been walking. No falls since his last appointment. No specific questions or concerns at this time.     Pertinent History  Patient reports his main issue is his balance. States he has a new doctor that refered him to this clinic instead of outpatient in [facility] where he has received physical therapy prior. Has received therapy for balance in the past (approximately 1 year ago). States he has fallen twice recently when he tripped over leg lift of recliner (6 weeks ago), and fell carrying groceries while  trying to open door (4 weeks ago). Reports he does have a fear of falling while walking. States he has circulation issues in his legs that has returned and has not told doctor. Completes water aerobics 2-3x a week. Patient states he also has shoulder pain and back pain, and that shoulder hurts is when he raises arm in abduction against resistance and when rolling over in bed. Used to enjoy playing golf. Would like to be able to walk around the block with his wife.     Limitations  Standing;Walking;House hold activities    How long can you stand comfortably?  5 minutes    How long can you walk comfortably?  5 minutes     Currently in Pain?  No/denies            TREATMENT   Physical Therapy Telehealth Visit:  I connected withMaurice Borentoday [id number] Webex video conference and verified that I am speaking with the correct person using two identifiers. I discussed the limitations, risks, security and privacy concerns of performing an evaluation and management service by Webex.I also discussed with the patient that there may be a patient responsible charge related to this service. The patient expressed understanding and agreed to proceed.Identified to the patient that therapist is a licensed physical therapist in the state of [place].  Other persons participating in the visit and their role in the encounter:Daughter and wife present but not assisting withencounter Patient's location:home Patient's address:[address] [name] [address](confirmed in case of emergency) Patient's phone #[phone](confirmed in case of technical difficulties) Provider's location:[facility] main OP rehab gym [facility] Patient agreed to evaluation/treatment by telemedicine   Ther-Ex All standing exercises performed with back to empty corner of room for safety; Standingmarches without UE support 1 minute x 2; Standing mini squats 1 minute x 2; Standing heel/toe raises 1 minute x 2; MiniLungeswith UE supporton chairalternating LEx 1 minute on each side; Lateral lunges with UE support on chair alternating LE x 1 minute toward each side;  Sit to stand without UE support from regular heightas many as possible 30s x 2;   Neuromuscular Re-education All standing balance exercises performed with back to empty corner of room for safety; Feet together (NBOS) eyes closed 1 minute x 2; NBOS eyes closed with horizontal head turns 1 minute x 2; Pt requires seated rest breaks between sets;   Pt educated throughout session about proper posture and technique with exercises. Improved exercise technique, movement at target  joints, use of target muscles after min to mod verbal cues.   Pt demonstrates good motivation during session. He is more fatigued today during session and requires multiple seated rest breaks. Toward the end of the session he reports increase in low back pain and requests that session be terminated early. Unable to perform much in the way of balance training today.Pt encouraged to continue HEP and follow-upnext week. He prefers to continue via telemedicine at this time.Hewill benefit from PT services to address deficits in strength, balance, and mobility in order to return to full function at home.                       PT Short Term Goals - 01/29/19 1517      PT SHORT TERM GOAL #1   Title  Patient will be independent with completion of HEP to improve ability to complete functional tasks.    Baseline  HEP compliant; 11/27/18: Pt only performing 3d/wk  Time  2    Period  Weeks    Status  On-going    Target Date  02/26/19      PT SHORT TERM GOAL #2   Title  Patient will report no falls in the next two weeks to demonstrate improved safety and fall risk. 11/27/18: Pt fell Saturday morning while he was putting something in the bottom shelf of refrigerator    Baseline  07/09/18: fell 2x in 6 weeks 1/7: no falls; 11/27/18: Pt fell 3 days prior to treatment; 12/20/18: No falls since 11/24/18    Time  2    Period  Weeks    Status  Achieved    Target Date  12/25/18        PT Long Term Goals - 01/30/19 0915      PT LONG TERM GOAL #1   Title  Patient will improve 5x sit to stand to <10seconds without UE support to demonstrate improvements in functional strength.     Baseline  07/09/18: 13sec hands on knees 1/7: 13 seconds occasional hand on knee; 11/27/18: 12.3s no UE support, occasional posterior leaning; 12/20/18: 11.9s with occasional posterior leaning; 01/29/19: Unable to assess over telemedicine    Time  8    Period  Weeks    Status  Partially Met    Target Date   03/26/19      PT LONG TERM GOAL #2   Title  Patient will increase BERG balance score to >52/56 to decrease fall risk and improve balance.     Baseline  07/09/18: 47/56 1/7: 52/56; 11/27/18: 52/56; 12/20/18: 53/56; 01/29/19: Unable to assess over telemedicine    Time  8    Period  Weeks    Status  Achieved      PT LONG TERM GOAL #3   Title  Patient will be able to walk with wife around neighborhood (>44mnutes) to demonstrate improved balance and community ambulation.     Baseline  07/09/18: 555mutes 1/7: wife just returned; 11/27/18: has not attempted; 12/20/18: has not attempted yet; 01/30/19: Has not attempted yet    Time  6    Period  Weeks    Status  On-going    Target Date  03/26/19      PT LONG TERM GOAL #4   Title  Patient will improve 10648mto >1.48m/448mimiting fall risk and improve community ambulation.     Baseline  07/09/18: 0.833m/52m/7: 1.25 m/s; 11/27/18: 7.9s=1.27 m/s; 12/20/18: Fastest: 7.5s = 1.33 m/s;    Time  6    Period  Weeks    Status  Achieved      PT LONG TERM GOAL #5   Title  Patient will increase DGI to >18/24 to indicate increased stability with dynamic mobility and decrease fall risk.     Baseline  1/7: 14/24; 11/27/18: 17/24; 12/20/18: 20/24;    Time  6    Period  Weeks    Status  Achieved      PT LONG TERM GOAL #6   Title  Pt able to descend 8 steps with single UE assist with no LOB or heel catching on step in order to improve community access and safety on steps.    Baseline  1/7: BUE support and heel catching; 11/27/18: Heel still catching; 12/20/18: Still requires UE assist; 01/29/19: Unable to assess over telemedicine    Time  8    Period  Weeks    Status  On-going    Target Date  03/26/19      PT LONG TERM GOAL #7   Title   Pt will improve DGI by at least 3 points in order to demonstrate clinically significant improvement in balance and decreased risk for falls     Baseline  12/20/18: 20/24; 01/29/19: Unable to assess over telemedicine    Time  8    Period   Weeks    Status  On-going    Target Date  03/26/19      PT LONG TERM GOAL #8   Title  Pt will increase 6MWT by at least 23m(1655f in order to demonstrate clinically significant improvement in cardiopulmonary endurance and community ambulation     Baseline  12/20/18: 1070'; 01/29/19: Unable to assess over telemedicine    Time  8    Period  Weeks    Status  On-going    Target Date  03/26/19            Plan - 02/21/19 1024    Clinical Impression Statement  .Pt demonstrates good motivation during session. He is more fatigued today during session and requires multiple seated rest breaks. Toward the end of the session he reports increase in low back pain and requests that session be terminated early. Unable to perform much in the way of balance training today.Pt encouraged to continue HEP and follow-upnext week. He prefers to continue via telemedicine at this time.Hewill benefit from PT services to address deficits in strength, balance, and mobility in order to return to full function at home.    Stability/Clinical Decision Making  Stable/Uncomplicated    Rehab Potential  Good    Clinical Impairments Affecting Rehab Potential  (+) support system (-) age;     PT Frequency  2x / week    PT Duration  8 weeks    PT Treatment/Interventions  ADLs/Self Care Home Management;Energy conservation;Patient/family education;Therapeutic exercise;Balance training;Neuromuscular re-education;Therapeutic activities;Functional mobility training;Stair training;DME Instruction;Gait training;Aquatic Therapy;Cryotherapy;Electrical Stimulation;Iontophoresis 71m49ml Dexamethasone;Moist Heat;Traction;Ultrasound;Taping;Passive range of motion;Vestibular;Manual techniques    PT Next Visit Plan  strength, balance; static balance while engaged in UE task, head turning activities for balance    PT Home Exercise Plan  Semitandem balance, semitandem balance with horizontal head turns, single leg balance, heel/toe raises  ([id number]  Consulted and Agree with Plan of Care  Patient       Patient will benefit from skilled therapeutic intervention in order to improve the following deficits and impairments:  Abnormal gait, Decreased activity tolerance, Decreased balance, Decreased coordination, Decreased endurance, Decreased mobility, Decreased strength, Difficulty walking, Impaired sensation, Pain, Impaired UE functional use, Postural dysfunction, Improper body mechanics, Impaired perceived functional ability, Decreased knowledge of use of DME  Visit Diagnosis: Muscle weakness (generalized)  Gait difficulty  Unsteadiness on feet     Problem List There are no active problems to display for this patient.  JasPhillips Grout, DPT, GCS  Huprich,Jason 02/21/2019, 4:34 PM  Con[facility]ambriaIN REHEver[facility] LLCRVICES [address] [place]one: [phone]:  [phone]: MauFARHAAN Torres: [phone] of Birth: [birth year]

## 2019-03-01 ENCOUNTER — Ambulatory Visit: Payer: Medicare Other

## 2019-03-01 ENCOUNTER — Other Ambulatory Visit: Payer: Self-pay

## 2019-03-01 DIAGNOSIS — R269 Unspecified abnormalities of gait and mobility: Secondary | ICD-10-CM

## 2019-03-01 DIAGNOSIS — M6281 Muscle weakness (generalized): Secondary | ICD-10-CM

## 2019-03-01 DIAGNOSIS — R2681 Unsteadiness on feet: Secondary | ICD-10-CM

## 2019-03-01 NOTE — Therapy (Signed)
[place] MAIN [facility] SERVICES [address] [place], [place], [address] Phone: [phone]   Fax:  [phone]  Physical Therapy Treatment  Patient Details  Name: Eric Torres MRN: [id number] Date of Birth: 1931-11-04 Referring Provider (PT): Eric Torres   Encounter Date: 03/01/2019  PT End of Session - 03/01/19 0902    Visit Number  37    Number of Visits  60    Date for PT Re-Evaluation  03/26/19    Authorization Type  Last goals 12/20/18; addressed via telemedicine 01/29/19    PT Start Time  0858    PT Stop Time  0942    PT Time Calculation (min)  44 min    Equipment Utilized During Treatment  Gait belt    Activity Tolerance  Patient tolerated treatment well    Behavior During Therapy  [facility] for tasks assessed/performed       Past Medical History:  Diagnosis Date  . Bilateral foot pain    LIKELY NEUROPATHY  . BPH (benign prostatic hyperplasia)   . Cancer (HCC)    ,HX OF PROSTATE CANCER AND BLADDER CANCER  . Collagenous colitis   . COPD (chronic obstructive pulmonary disease) (HCC)    MILD-NO INHALERS PER PT  . Essential tremor   . Frequent falls   . Heart murmur   . History of brachytherapy   . History of TIA (transient ischemic attack) 2015   LEFT THUMB AND INDEX FINGER-SOMETIMES I DROP THINGS  . Hypercholesteremia   . Hypertension   . Hypothyroidism   . PVD (peripheral vascular disease) (Eric Torres)   . Stroke ([place])   . Vitamin D deficiency     Past Surgical History:  Procedure Laterality Date  . CHOLECYSTECTOMY    . CYSTOSCOPY WITH BIOPSY N/A 12/29/2015   Procedure: CYSTOSCOPY WITH BIOPSY;  Surgeon: Eric Cowper, MD;  Location: [facility] ORS;  Service: Urology;  Laterality: N/A;  . DIAGNOSTIC LAPAROSCOPY    . ESOPHAGOGASTRODUODENOSCOPY (EGD) WITH PROPOFOL N/A 02/15/2019   Procedure: ESOPHAGOGASTRODUODENOSCOPY (EGD) WITH PROPOFOL;  Surgeon: Eric Sails, MD;  Location: [facility] ENDOSCOPY;  Service: Endoscopy;  Laterality:  N/A;  . EXPLORATORY LAPAROTOMY  1991   VOLVULUS AND BOWEL OBSTRUCTION  . HERNIA REPAIR    . ILIAC ARTERY STENT Bilateral   . TONSILLECTOMY    . TRANSURETHRAL RESECTION OF BLADDER TUMOR N/A 12/29/2015   Procedure: TRANSURETHRAL RESECTION OF BLADDER TUMOR (TURBT)/MITOMYCIN INSTILLATION;  Surgeon: Eric Cowper, MD;  Location: [facility] ORS;  Service: Urology;  Laterality: N/A;  . TRANSURETHRAL RESECTION OF BLADDER TUMOR WITH GYRUS (TURBT-GYRUS)  2013  . VASECTOMY      There were no vitals filed for this visit.  Subjective Assessment - 03/01/19 0858    Subjective  Pt reports that he is doing well today. He denies any low back pain upon arrival today. He has remained active at home and has performed a seated exercise video program with his daughter a couple times since his last session. No falls since his last appointment. No specific questions or concerns at this time.     Pertinent History  Patient reports his main issue is his balance. States he has a new doctor that refered him to this clinic instead of outpatient in [facility] where he has received physical therapy prior. Has received therapy for balance in the past (approximately 1 year ago). States he has fallen twice recently when he tripped over leg lift of recliner (6 weeks ago), and fell carrying groceries  while trying to open door (4 weeks ago). Reports he does have a fear of falling while walking. States he has circulation issues in his legs that has returned and has not told doctor. Completes water aerobics 2-3x a week. Patient states he also has shoulder pain and back pain, and that shoulder hurts is when he raises arm in abduction against resistance and when rolling over in bed. Used to enjoy playing golf. Would like to be able to walk around the block with his wife.     Limitations  Standing;Walking;House hold activities    How long can you stand comfortably?  5 minutes    How long can you walk comfortably?  5 minutes     Currently in Pain?   No/denies            TREATMENT   Physical Therapy Telehealth Visit:  I connected withMaurice Borentoday [id number] Webex video conference and verified that I am speaking with the correct person using two identifiers. I discussed the limitations, risks, security and privacy concerns of performing an evaluation and management service by Webex.I also discussed with the patient that there may be a patient responsible charge related to this service. The patient expressed understanding and agreed to proceed.Identified to the patient that therapist is a licensed physical therapist in the state of [place].  Other persons participating in the visit and their role in the encounter:Daughter present but not assisting withencounter Patient's location:Home Patient's address:[address] [place] [address](confirmed in case of emergency) Patient's phone #[phone](confirmed in case of technical difficulties), [phone] Eric Torres, daughter); Provider's location:[facility] main OP rehab gym [facility] [place] Patient agreed to evaluation/treatment by telemedicine   Ther-Ex All standing exercises performed with back to empty corner of room for safety; Standingmarches without UE support x 1 minute; Standing mini squats1 minutex 2; Standing heel/toe raises 1 minute x 2; MiniLungeswith UE supporton chairalternating LEx 1 minute on each side; Lateral lunges with UE support on chair alternating LE x 1 minute toward each side; Sit to stand without UE support from regular heightas many as possible 1 minute x 2;   Neuromuscular Re-education All standing balance exercises performed with back to empty corner of room for safety; Semitandem eyes closed alternating forward LE 30s x 2 on each side; Full tandem balance eyes open alternating forward LE 30s x 2 on each side;; Pt requires seated rest breaks between sets;   Pt educated throughout session about proper posture  and technique with exercises. Improved exercise technique, movement at target joints, use of target muscles after min to mod verbal cues.   Pt demonstrates good motivation during session.He is less fatigued today during session but continues to require intermittent seated rest breaks. No low back pain reported today during session. He is able to complete all exercises as instructed and is able to increase the duration of time for sit to stand exercise as well as increase sets for lunges. Pt encouraged to continue HEP including seated exercise vidoes with his daughter. Pt will continue to benefit from skilled PT services to address deficits in strength, balance, and mobility in order to return to full function at home.                       PT Short Term Goals - 01/29/19 1517      PT SHORT TERM GOAL #1   Title  Patient will be independent with completion of HEP to improve ability to complete functional  tasks.    Baseline  HEP compliant; 11/27/18: Pt only performing 3d/wk    Time  2    Period  Weeks    Status  On-going    Target Date  02/26/19      PT SHORT TERM GOAL #2   Title  Patient will report no falls in the next two weeks to demonstrate improved safety and fall risk. 11/27/18: Pt fell Saturday morning while he was putting something in the bottom shelf of refrigerator    Baseline  07/09/18: fell 2x in 6 weeks 1/7: no falls; 11/27/18: Pt fell 3 days prior to treatment; 12/20/18: No falls since 11/24/18    Time  2    Period  Weeks    Status  Achieved    Target Date  12/25/18        PT Long Term Goals - 01/30/19 0915      PT LONG TERM GOAL #1   Title  Patient will improve 5x sit to stand to <10seconds without UE support to demonstrate improvements in functional strength.     Baseline  07/09/18: 13sec hands on knees 1/7: 13 seconds occasional hand on knee; 11/27/18: 12.3s no UE support, occasional posterior leaning; 12/20/18: 11.9s with occasional posterior leaning;  01/29/19: Unable to assess over telemedicine    Time  8    Period  Weeks    Status  Partially Met    Target Date  03/26/19      PT LONG TERM GOAL #2   Title  Patient will increase BERG balance score to >52/56 to decrease fall risk and improve balance.     Baseline  07/09/18: 47/56 1/7: 52/56; 11/27/18: 52/56; 12/20/18: 53/56; 01/29/19: Unable to assess over telemedicine    Time  8    Period  Weeks    Status  Achieved      PT LONG TERM GOAL #3   Title  Patient will be able to walk with wife around neighborhood (>77mnutes) to demonstrate improved balance and community ambulation.     Baseline  07/09/18: 514mutes 1/7: wife just returned; 11/27/18: has not attempted; 12/20/18: has not attempted yet; 01/30/19: Has not attempted yet    Time  6    Period  Weeks    Status  On-going    Target Date  03/26/19      PT LONG TERM GOAL #4   Title  Patient will improve 1077mto >1.56m/29mimiting fall risk and improve community ambulation.     Baseline  07/09/18: 0.833m/46m/7: 1.25 m/s; 11/27/18: 7.9s=1.27 m/s; 12/20/18: Fastest: 7.5s = 1.33 m/s;    Time  6    Period  Weeks    Status  Achieved      PT LONG TERM GOAL #5   Title  Patient will increase DGI to >18/24 to indicate increased stability with dynamic mobility and decrease fall risk.     Baseline  1/7: 14/24; 11/27/18: 17/24; 12/20/18: 20/24;    Time  6    Period  Weeks    Status  Achieved      PT LONG TERM GOAL #6   Title  Pt able to descend 8 steps with single UE assist with no LOB or heel catching on step in order to improve community access and safety on steps.    Baseline  1/7: BUE support and heel catching; 11/27/18: Heel still catching; 12/20/18: Still requires UE assist; 01/29/19: Unable to assess over telemedicine    Time  8  Period  Weeks    Status  On-going    Target Date  03/26/19      PT LONG TERM GOAL #7   Title   Pt will improve DGI by at least 3 points in order to demonstrate clinically significant improvement in balance and  decreased risk for falls     Baseline  12/20/18: 20/24; 01/29/19: Unable to assess over telemedicine    Time  8    Period  Weeks    Status  On-going    Target Date  03/26/19      PT LONG TERM GOAL #8   Title  Pt will increase 6MWT by at least 28m(1648f in order to demonstrate clinically significant improvement in cardiopulmonary endurance and community ambulation     Baseline  12/20/18: 1070'; 01/29/19: Unable to assess over telemedicine    Time  8    Period  Weeks    Status  On-going    Target Date  03/26/19            Plan - 03/01/19 0903    Clinical Impression Statement  Pt demonstrates good motivation during session.He is less fatigued today during session but continues to require intermittent seated rest breaks. No low back pain reported today during session. He is able to complete all exercises as instructed and is able to increase the duration of time for sit to stand exercise as well as increase sets for lunges. Pt encouraged to continue HEP including seated exercise vidoes with his daughter. Pt will continue to benefit from skilled PT services to address deficits in strength, balance, and mobility in order to return to full function at home.    Stability/Clinical Decision Making  Stable/Uncomplicated    Rehab Potential  Good    Clinical Impairments Affecting Rehab Potential  (+) support system (-) age;     PT Frequency  2x / week    PT Duration  8 weeks    PT Treatment/Interventions  ADLs/Self Care Home Management;Energy conservation;Patient/family education;Therapeutic exercise;Balance training;Neuromuscular re-education;Therapeutic activities;Functional mobility training;Stair training;DME Instruction;Gait training;Aquatic Therapy;Cryotherapy;Electrical Stimulation;Iontophoresis 51m102ml Dexamethasone;Moist Heat;Traction;Ultrasound;Taping;Passive range of motion;Vestibular;Manual techniques    PT Next Visit Plan  strength, balance; static balance while engaged in UE task, head  turning activities for balance    PT Home Exercise Plan  Semitandem balance, semitandem balance with horizontal head turns, single leg balance, heel/toe raises ([id number]  Consulted and Agree with Plan of Care  Patient       Patient will benefit from skilled therapeutic intervention in order to improve the following deficits and impairments:  Abnormal gait, Decreased activity tolerance, Decreased balance, Decreased coordination, Decreased endurance, Decreased mobility, Decreased strength, Difficulty walking, Impaired sensation, Pain, Impaired UE functional use, Postural dysfunction, Improper body mechanics, Impaired perceived functional ability, Decreased knowledge of use of DME  Visit Diagnosis: Muscle weakness (generalized)  Gait difficulty  Unsteadiness on feet     Problem List There are no active problems to display for this patient.  JasPhillips Grout, DPT, GCS  Dwyne Hasegawa 03/01/2019, 9:51 AM  [facility] REHCrot[facility] CenterRVICES [address] [place]one: [phone]:  [phone]: MauWYNTON HUFSTETLERN: [phone] of Birth: 7/3August 17, 1933

## 2019-03-05 ENCOUNTER — Ambulatory Visit: Payer: Medicare Other

## 2019-03-07 ENCOUNTER — Ambulatory Visit: Payer: Medicare Other

## 2019-03-07 ENCOUNTER — Other Ambulatory Visit: Payer: Self-pay

## 2019-03-07 DIAGNOSIS — R2681 Unsteadiness on feet: Secondary | ICD-10-CM

## 2019-03-07 DIAGNOSIS — M6281 Muscle weakness (generalized): Secondary | ICD-10-CM | POA: Diagnosis not present

## 2019-03-07 NOTE — Therapy (Signed)
[place] MAIN [facility] SERVICES [address] [place], [place], [address] Phone: [phone]   Fax:  [phone]  Physical Therapy Treatment  Patient Details  Name: Eric Torres MRN: [id number] Date of Birth: 02/05/1932 Referring Provider (PT): Eric Torres   Encounter Date: 03/07/2019  PT End of Session - 03/07/19 1533    Visit Number  38    Number of Visits  60    Date for PT Re-Evaluation  03/26/19    Authorization Type  Last goals 12/20/18; addressed via telemedicine 01/29/19    PT Start Time  1530    PT Stop Time  1615    PT Time Calculation (min)  45 min    Equipment Utilized During Treatment  Gait belt    Activity Tolerance  Patient tolerated treatment well    Behavior During Therapy  WFL for tasks assessed/performed       Past Medical History:  Diagnosis Date  . Bilateral foot pain    LIKELY NEUROPATHY  . BPH (benign prostatic hyperplasia)   . Cancer (HCC)    ,HX OF PROSTATE CANCER AND BLADDER CANCER  . Collagenous colitis   . COPD (chronic obstructive pulmonary disease) (HCC)    MILD-NO INHALERS PER PT  . Essential tremor   . Frequent falls   . Heart murmur   . History of brachytherapy   . History of TIA (transient ischemic attack) 2015   LEFT THUMB AND INDEX FINGER-SOMETIMES I DROP THINGS  . Hypercholesteremia   . Hypertension   . Hypothyroidism   . PVD (peripheral vascular disease) ([place])   . Stroke ([place])   . Vitamin D deficiency     Past Surgical History:  Procedure Laterality Date  . CHOLECYSTECTOMY    . CYSTOSCOPY WITH BIOPSY N/A 12/29/2015   Procedure: CYSTOSCOPY WITH BIOPSY;  Surgeon: Royston Cowper, MD;  Location: [facility] ORS;  Service: Urology;  Laterality: N/A;  . DIAGNOSTIC LAPAROSCOPY    . ESOPHAGOGASTRODUODENOSCOPY (EGD) WITH PROPOFOL N/A 02/15/2019   Procedure: ESOPHAGOGASTRODUODENOSCOPY (EGD) WITH PROPOFOL;  Surgeon: Lollie Sails, MD;  Location: [facility] ENDOSCOPY;  Service: Endoscopy;  Laterality:  N/A;  . EXPLORATORY LAPAROTOMY  1991   VOLVULUS AND BOWEL OBSTRUCTION  . HERNIA REPAIR    . ILIAC ARTERY STENT Bilateral   . TONSILLECTOMY    . TRANSURETHRAL RESECTION OF BLADDER TUMOR N/A 12/29/2015   Procedure: TRANSURETHRAL RESECTION OF BLADDER TUMOR (TURBT)/MITOMYCIN INSTILLATION;  Surgeon: Royston Cowper, MD;  Location: [facility] ORS;  Service: Urology;  Laterality: N/A;  . TRANSURETHRAL RESECTION OF BLADDER TUMOR WITH GYRUS (TURBT-GYRUS)  2013  . VASECTOMY      There were no vitals filed for this visit.  Subjective Assessment - 03/07/19 1532    Subjective  Pt reports that he is doing well today. 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 He denies any low back pain upon arrival today. He has remained active at home and has performed a seated exercise video program with his daughter a couple times since his last session. No falls since his last appointment. No specific questions or concerns at this time.     Pertinent History  Patient reports his main issue is his balance. States he has a new doctor that refered him to this clinic instead of outpatient in [facility] where he has received physical therapy prior. Has received therapy for balance in the past (approximately 1 year ago). States he has fallen twice recently when  he tripped over leg lift of recliner (6 weeks ago), and fell carrying groceries while trying to open door (4 weeks ago). Reports he does have a fear of falling while walking. States he has circulation issues in his legs that has returned and has not told doctor. Completes water aerobics 2-3x a week. Patient states he also has shoulder pain and back pain, and that shoulder hurts is when he raises arm in abduction against resistance and when rolling over in bed. Used to enjoy playing golf. Would like to be able to walk around the block with his wife.     Limitations  Standing;Walking;House hold activities    How long can you stand comfortably?  5 minutes     How long can you walk comfortably?  5 minutes     Currently in Pain?  No/denies          TREATMENT   Physical Therapy Telehealth Visit:  I connected withMaurice Borentoday [id number] Webex video conference and verified that I am speaking with the correct person using two identifiers. I discussed the limitations, risks, security and privacy concerns of performing an evaluation and management service by Webex.I also discussed with the patient that there may be a patient responsible charge related to this service. The patient expressed understanding and agreed to proceed.Identified to the patient that therapist is a licensed physical therapist in the state of [place].  Other persons participating in the visit and their role in the encounter:Daughter present but not assisting withencounter Patient's location:Home Patient's address:[address] [name] [address](confirmed in case of emergency) Patient's phone #[phone](confirmed in case of technical difficulties), [phone] Eric Torres, daughter); Provider's location:[facility] [place]  Patient agreed to evaluation/treatment by telemedicine   Ther-Ex All standing exercises performed with back to empty corner of room for safety; Standingmarches without UE support x 1 minute; Standing mini squats1 minutex 2; Standing heel/toe raises 1 minute x 2; MiniLungeswith UE supporton chairalternating LEx 1 minute on each side; Sit to stand without UE support from regular heightas many as possible 1 minute x 2;   Neuromuscular Re-education All standing balance exercises performed with back to empty corner of room for safety; Semitandem eyes closed alternating forward LE 30sx 2 on each side; Full tandem balance eyes open alternating forward LE 30s x 2 on each side; Semitandem eyes open alternating forward LE horizontal head turns 30s x 2 on each side; Pt requires seated rest breaks  between sets;   Pt educated throughout session about proper posture and technique with exercises. Improved exercise technique, movement at target joints, use of target muscles after min to mod verbal cues.   Pt demonstrates good motivation during session.He is less fatigued today during session but continues to require intermittent seatedrest breaks.No low back pain reported today during session. He is able to complete all exercises as instructed by therapist. Pt encouraged to continue HEP including seated exercise vidoes with his daughter. Pt will continue to benefit from skilled PT services to address deficits in strength, balance, and mobility in order to return to full function at home.                        PT Short Term Goals - 01/29/19 1517      PT SHORT TERM GOAL #1   Title  Patient will be independent with completion of HEP to improve ability to complete functional tasks.    Baseline  HEP compliant; 11/27/18:  Pt only performing 3d/wk    Time  2    Period  Weeks    Status  On-going    Target Date  02/26/19      PT SHORT TERM GOAL #2   Title  Patient will report no falls in the next two weeks to demonstrate improved safety and fall risk. 11/27/18: Pt fell Saturday morning while he was putting something in the bottom shelf of refrigerator    Baseline  07/09/18: fell 2x in 6 weeks 1/7: no falls; 11/27/18: Pt fell 3 days prior to treatment; 12/20/18: No falls since 11/24/18    Time  2    Period  Weeks    Status  Achieved    Target Date  12/25/18        PT Long Term Goals - 01/30/19 0915      PT LONG TERM GOAL #1   Title  Patient will improve 5x sit to stand to <10seconds without UE support to demonstrate improvements in functional strength.     Baseline  07/09/18: 13sec hands on knees 1/7: 13 seconds occasional hand on knee; 11/27/18: 12.3s no UE support, occasional posterior leaning; 12/20/18: 11.9s with occasional posterior leaning; 01/29/19: Unable to  assess over telemedicine    Time  8    Period  Weeks    Status  Partially Met    Target Date  03/26/19      PT LONG TERM GOAL #2   Title  Patient will increase BERG balance score to >52/56 to decrease fall risk and improve balance.     Baseline  07/09/18: 47/56 1/7: 52/56; 11/27/18: 52/56; 12/20/18: 53/56; 01/29/19: Unable to assess over telemedicine    Time  8    Period  Weeks    Status  Achieved      PT LONG TERM GOAL #3   Title  Patient will be able to walk with wife around neighborhood (>65mnutes) to demonstrate improved balance and community ambulation.     Baseline  07/09/18: 5659mutes 1/7: wife just returned; 11/27/18: has not attempted; 12/20/18: has not attempted yet; 01/30/19: Has not attempted yet    Time  6    Period  Weeks    Status  On-going    Target Date  03/26/19      PT LONG TERM GOAL #4   Title  Patient will improve 1034mto >1.59m/17mimiting fall risk and improve community ambulation.     Baseline  07/09/18: 0.833m/67m/7: 1.25 m/s; 11/27/18: 7.9s=1.27 m/s; 12/20/18: Fastest: 7.5s = 1.33 m/s;    Time  6    Period  Weeks    Status  Achieved      PT LONG TERM GOAL #5   Title  Patient will increase DGI to >18/24 to indicate increased stability with dynamic mobility and decrease fall risk.     Baseline  1/7: 14/24; 11/27/18: 17/24; 12/20/18: 20/24;    Time  6    Period  Weeks    Status  Achieved      PT LONG TERM GOAL #6   Title  Pt able to descend 8 steps with single UE assist with no LOB or heel catching on step in order to improve community access and safety on steps.    Baseline  1/7: BUE support and heel catching; 11/27/18: Heel still catching; 12/20/18: Still requires UE assist; 01/29/19: Unable to assess over telemedicine    Time  8    Period  Weeks    Status  On-going    Target Date  03/26/19      PT LONG TERM GOAL #7   Title   Pt will improve DGI by at least 3 points in order to demonstrate clinically significant improvement in balance and decreased risk for falls      Baseline  12/20/18: 20/24; 01/29/19: Unable to assess over telemedicine    Time  8    Period  Weeks    Status  On-going    Target Date  03/26/19      PT LONG TERM GOAL #8   Title  Pt will increase 6MWT by at least 85m(1664f in order to demonstrate clinically significant improvement in cardiopulmonary endurance and community ambulation     Baseline  12/20/18: 1070'; 01/29/19: Unable to assess over telemedicine    Time  8    Period  Weeks    Status  On-going    Target Date  03/26/19            Plan - 03/07/19 1534    Clinical Impression Statement  Pt demonstrates good motivation during session.He is less fatigued today during session but continues to require intermittent seatedrest breaks.No low back pain reported today during session. He is able to complete all exercises as instructed by therapist. Pt encouraged to continue HEP including seated exercise vidoes with his daughter. Pt will continue to benefit from skilled PT services to address deficits in strength, balance, and mobility in order to return to full function at home.    Stability/Clinical Decision Making  Stable/Uncomplicated    Rehab Potential  Good    Clinical Impairments Affecting Rehab Potential  (+) support system (-) age;     PT Frequency  2x / week    PT Duration  8 weeks    PT Treatment/Interventions  ADLs/Self Care Home Management;Energy conservation;Patient/family education;Therapeutic exercise;Balance training;Neuromuscular re-education;Therapeutic activities;Functional mobility training;Stair training;DME Instruction;Gait training;Aquatic Therapy;Cryotherapy;Electrical Stimulation;Iontophoresis 34m56ml Dexamethasone;Moist Heat;Traction;Ultrasound;Taping;Passive range of motion;Vestibular;Manual techniques    PT Next Visit Plan  strength, balance; static balance while engaged in UE task, head turning activities for balance    PT Home Exercise Plan  Semitandem balance, semitandem balance with horizontal head  turns, single leg balance, heel/toe raises ([id number]  Consulted and Agree with Plan of Care  Patient       Patient will benefit from skilled therapeutic intervention in order to improve the following deficits and impairments:  Abnormal gait, Decreased activity tolerance, Decreased balance, Decreased coordination, Decreased endurance, Decreased mobility, Decreased strength, Difficulty walking, Impaired sensation, Pain, Impaired UE functional use, Postural dysfunction, Improper body mechanics, Impaired perceived functional ability, Decreased knowledge of use of DME  Visit Diagnosis: Muscle weakness (generalized)  Unsteadiness on feet     Problem List There are no active problems to display for this patient.  JasPhillips Grout, DPT, GCS  Ross Bender 03/07/2019, 4:40 PM  [facility] [address] [id number]: [phone]:  [phone]: MauDALONTE HARDAGEN: [phone] of Birth: 7/301-07-1932

## 2019-03-13 ENCOUNTER — Ambulatory Visit: Payer: Medicare Other | Attending: Family Medicine

## 2019-03-13 ENCOUNTER — Other Ambulatory Visit: Payer: Self-pay

## 2019-03-13 DIAGNOSIS — M6281 Muscle weakness (generalized): Secondary | ICD-10-CM | POA: Diagnosis not present

## 2019-03-13 DIAGNOSIS — R2681 Unsteadiness on feet: Secondary | ICD-10-CM | POA: Insufficient documentation

## 2019-03-13 NOTE — Therapy (Signed)
[place] MAIN [facility] SERVICES [address] [place], [place], [address] Phone: [phone]   Fax:  [phone]  Physical Therapy Treatment  Patient Details  Name: Eric Torres MRN: [id number] Date of Birth: 06-Sep-1932 Referring Provider (PT): Eric Torres   Encounter Date: 03/13/2019  PT End of Session - 03/13/19 1637    Visit Number  39    Number of Visits  60    Date for PT Re-Evaluation  03/26/19    Authorization Type  Last goals 12/20/18; addressed via telemedicine 01/29/19    PT Start Time  1632    PT Stop Time  1715    PT Time Calculation (min)  43 min    Equipment Utilized During Treatment  Gait belt    Activity Tolerance  Patient tolerated treatment well    Behavior During Therapy  WFL for tasks assessed/performed       Past Medical History:  Diagnosis Date  . Bilateral foot pain    LIKELY NEUROPATHY  . BPH (benign prostatic hyperplasia)   . Cancer (HCC)    ,HX OF PROSTATE CANCER AND BLADDER CANCER  . Collagenous colitis   . COPD (chronic obstructive pulmonary disease) (HCC)    MILD-NO INHALERS PER PT  . Essential tremor   . Frequent falls   . Heart murmur   . History of brachytherapy   . History of TIA (transient ischemic attack) 2015   LEFT THUMB AND INDEX FINGER-SOMETIMES I DROP THINGS  . Hypercholesteremia   . Hypertension   . Hypothyroidism   . PVD (peripheral vascular disease) ([place])   . Stroke ([place])   . Vitamin D deficiency     Past Surgical History:  Procedure Laterality Date  . CHOLECYSTECTOMY    . CYSTOSCOPY WITH BIOPSY N/A 12/29/2015   Procedure: CYSTOSCOPY WITH BIOPSY;  Surgeon: Royston Cowper, MD;  Location: [facility] ORS;  Service: Urology;  Laterality: N/A;  . DIAGNOSTIC LAPAROSCOPY    . ESOPHAGOGASTRODUODENOSCOPY (EGD) WITH PROPOFOL N/A 02/15/2019   Procedure: ESOPHAGOGASTRODUODENOSCOPY (EGD) WITH PROPOFOL;  Surgeon: Lollie Sails, MD;  Location: [facility] ENDOSCOPY;  Service: Endoscopy;  Laterality:  N/A;  . EXPLORATORY LAPAROTOMY  1991   VOLVULUS AND BOWEL OBSTRUCTION  . HERNIA REPAIR    . ILIAC ARTERY STENT Bilateral   . TONSILLECTOMY    . TRANSURETHRAL RESECTION OF BLADDER TUMOR N/A 12/29/2015   Procedure: TRANSURETHRAL RESECTION OF BLADDER TUMOR (TURBT)/MITOMYCIN INSTILLATION;  Surgeon: Royston Cowper, MD;  Location: [facility] ORS;  Service: Urology;  Laterality: N/A;  . TRANSURETHRAL RESECTION OF BLADDER TUMOR WITH GYRUS (TURBT-GYRUS)  2013  . VASECTOMY      There were no vitals filed for this visit.  Subjective Assessment - 03/13/19 1637    Subjective  Pt states that he has been feeling unsteady today. He had a fall this morning in the bathroom while bending over to pick up a box. He bumped his head lightly on the commode but denies any neurological symptoms. Otherwise no injury. He thought about cancelling but he would like to try to complete his therapy session.     Pertinent History  Patient reports his main issue is his balance. States he has a new doctor that refered him to this clinic instead of outpatient in [facility] where he has received physical therapy prior. Has received therapy for balance in the past (approximately 1 year ago). States he has fallen twice recently when he tripped over leg lift of recliner (6 weeks ago), and fell  carrying groceries while trying to open door (4 weeks ago). Reports he does have a fear of falling while walking. States he has circulation issues in his legs that has returned and has not told doctor. Completes water aerobics 2-3x a week. Patient states he also has shoulder pain and back pain, and that shoulder hurts is when he raises arm in abduction against resistance and when rolling over in bed. Used to enjoy playing golf. Would like to be able to walk around the block with his wife.     Limitations  Standing;Walking;House hold activities    How long can you stand comfortably?  5 minutes    How long can you walk comfortably?  5 minutes     Currently in  Pain?  Yes    Pain Score  3     Pain Location  Back    Pain Orientation  Medial;Lower    Pain Descriptors / Indicators  Aching    Pain Type  Chronic pain    Pain Onset  More than a month ago           TREATMENT   Physical Therapy Telehealth Visit:  I connected withMaurice JHERDEYCXK Torres Webex video conference and verified that I am speaking with the correct person using two identifiers. I discussed the limitations, risks, security and privacy concerns of performing an evaluation and management service by Webex.I also discussed with the patient that there may be a patient responsible charge related to this service. The patient expressed understanding and agreed to proceed.Identified to the patient that therapist is a licensed physical therapist in the state of [place].  Other persons participating in the visit and their role in the encounter:Daughter present but not assisting withencounter Patient's location:Home Patient's address:[address] [name] [address](confirmed in case of emergency) Patient's phone #[phone](confirmed in case of technical difficulties), [phone] Eric Torres, daughter); Provider's location:[facility] main OP rehab gym Eric Torres Patient agreed to evaluation/treatment by telemedicine   Ther-Ex All standing exercises performed with back to empty corner of room for safety; Seatedmarches with green tband around knees1 minute x 2; Seated clams with green tband around knees 1 minute x 2; Seated isometric adductor squeeze 3s hold 1 minute x 2; Seated heel raises with self manual resistance 1 minute x 2; Sit to stand without UE support from regular heightas many as possible1 minute x 2;   Neuromuscular Re-education All standing balance exercises performed with back to empty corner of room for safety; Slow marches without UE support, cues to decrease speed as much as possible to challenge SLS x 1 minute; Full tandem  balance eyes open alternating forward LE 30s x 2 on each side; Semitandem eyes open alternating forward LE horizontal head turns 30s x 2 on each side; Semitandemeyes closed alternating forward LE x 30son each side;   Pt requires seated rest breaks between sets;   Pt educated throughout session about proper posture and technique with exercises. Improved exercise technique, movement at target joints, use of target muscles after min to mod verbal cues.   Pt demonstrates good motivation during session. Focused more on sitting exercises today due to pt reporting that he felt more unstable and "fuzzy" today. He is able to complete all exercises as instructed by therapist today but does report more LE fatigue today during balance exercises. Pt encouraged to continue HEP including seated exercise vidoes with his daughter. Pt will continue to benefit from skilledPT services to address deficits in strength, balance, and  mobility in order to return to full function at home.                       PT Short Term Goals - 01/29/19 1517      PT SHORT TERM GOAL #1   Title  Patient will be independent with completion of HEP to improve ability to complete functional tasks.    Baseline  HEP compliant; 11/27/18: Pt only performing 3d/wk    Time  2    Period  Weeks    Status  On-going    Target Date  02/26/19      PT SHORT TERM GOAL #2   Title  Patient will report no falls in the next two weeks to demonstrate improved safety and fall risk. 11/27/18: Pt fell Saturday morning while he was putting something in the bottom shelf of refrigerator    Baseline  07/09/18: fell 2x in 6 weeks 1/7: no falls; 11/27/18: Pt fell 3 days prior to treatment; 12/20/18: No falls since 11/24/18    Time  2    Period  Weeks    Status  Achieved    Target Date  12/25/18        PT Long Term Goals - 01/30/19 0915      PT LONG TERM GOAL #1   Title  Patient will improve 5x sit to stand to <10seconds without  UE support to demonstrate improvements in functional strength.     Baseline  07/09/18: 13sec hands on knees 1/7: 13 seconds occasional hand on knee; 11/27/18: 12.3s no UE support, occasional posterior leaning; 12/20/18: 11.9s with occasional posterior leaning; 01/29/19: Unable to assess over telemedicine    Time  8    Period  Weeks    Status  Partially Met    Target Date  03/26/19      PT LONG TERM GOAL #2   Title  Patient will increase BERG balance score to >52/56 to decrease fall risk and improve balance.     Baseline  07/09/18: 47/56 1/7: 52/56; 11/27/18: 52/56; 12/20/18: 53/56; 01/29/19: Unable to assess over telemedicine    Time  8    Period  Weeks    Status  Achieved      PT LONG TERM GOAL #3   Title  Patient will be able to walk with wife around neighborhood (>73mnutes) to demonstrate improved balance and community ambulation.     Baseline  07/09/18: 572mutes 1/7: wife just returned; 11/27/18: has not attempted; 12/20/18: has not attempted yet; 01/30/19: Has not attempted yet    Time  6    Period  Weeks    Status  On-going    Target Date  03/26/19      PT LONG TERM GOAL #4   Title  Patient will improve 1043mto >1.51m/451mimiting fall risk and improve community ambulation.     Baseline  07/09/18: 0.833m/66m/7: 1.25 m/s; 11/27/18: 7.9s=1.27 m/s; 12/20/18: Fastest: 7.5s = 1.33 m/s;    Time  6    Period  Weeks    Status  Achieved      PT LONG TERM GOAL #5   Title  Patient will increase DGI to >18/24 to indicate increased stability with dynamic mobility and decrease fall risk.     Baseline  1/7: 14/24; 11/27/18: 17/24; 12/20/18: 20/24;    Time  6    Period  Weeks    Status  Achieved      PT LONG TERM GOAL #  6   Title  Pt able to descend 8 steps with single UE assist with no LOB or heel catching on step in order to improve community access and safety on steps.    Baseline  1/7: BUE support and heel catching; 11/27/18: Heel still catching; 12/20/18: Still requires UE assist; 01/29/19: Unable to  assess over telemedicine    Time  8    Period  Weeks    Status  On-going    Target Date  03/26/19      PT LONG TERM GOAL #7   Title   Pt will improve DGI by at least 3 points in order to demonstrate clinically significant improvement in balance and decreased risk for falls     Baseline  12/20/18: 20/24; 01/29/19: Unable to assess over telemedicine    Time  8    Period  Weeks    Status  On-going    Target Date  03/26/19      PT LONG TERM GOAL #8   Title  Pt will increase 6MWT by at least 46m(1635f in order to demonstrate clinically significant improvement in cardiopulmonary endurance and community ambulation     Baseline  12/20/18: 1070'; 01/29/19: Unable to assess over telemedicine    Time  8    Period  Weeks    Status  On-going    Target Date  03/26/19            Plan - 03/13/19 1638    Clinical Impression Statement  Pt demonstrates good motivation during session. Focused more on sitting exercises today due to pt reporting that he felt more unstable and "fuzzy" today. He is able to complete all exercises as instructed by therapist today but does report more LE fatigue today during balance exericses. Pt encouraged to continue HEP including seated exercise vidoes with his daughter. Pt will continue to benefit from skilledPT services to address deficits in strength, balance, and mobility in order to return to full function at home.    Stability/Clinical Decision Making  Stable/Uncomplicated    Rehab Potential  Good    Clinical Impairments Affecting Rehab Potential  (+) support system (-) age;     PT Frequency  2x / week    PT Duration  8 weeks    PT Treatment/Interventions  ADLs/Self Care Home Management;Energy conservation;Patient/family education;Therapeutic exercise;Balance training;Neuromuscular re-education;Therapeutic activities;Functional mobility training;Stair training;DME Instruction;Gait training;Aquatic Therapy;Cryotherapy;Electrical Stimulation;Iontophoresis '4mg'$ /ml  Dexamethasone;Moist Heat;Traction;Ultrasound;Taping;Passive range of motion;Vestibular;Manual techniques    PT Next Visit Plan  strength, balance; static balance while engaged in UE task, head turning activities for balance    PT Home Exercise Plan  Semitandem balance, semitandem balance with horizontal head turns, single leg balance, heel/toe raises ([id number]   Consulted and Agree with Plan of Care  Patient       Patient will benefit from skilled therapeutic intervention in order to improve the following deficits and impairments:  Abnormal gait, Decreased activity tolerance, Decreased balance, Decreased coordination, Decreased endurance, Decreased mobility, Decreased strength, Difficulty walking, Impaired sensation, Pain, Impaired UE functional use, Postural dysfunction, Improper body mechanics, Impaired perceived functional ability, Decreased knowledge of use of DME  Visit Diagnosis: Muscle weakness (generalized)  Unsteadiness on feet     Problem List There are no active problems to display for this patient.  JaPhillips GroutT, DPT, GCS  Jadesola Poynter 03/13/2019, 5:16 PM  [facility] REArm[facility] Center[facility] [address] [place]: [phone] Fax:  [phone]  Name: Eric Torres MRN: [id number] Date of Birth: Jan 06, 1932

## 2019-03-19 ENCOUNTER — Ambulatory Visit: Payer: Medicare Other

## 2019-03-19 ENCOUNTER — Ambulatory Visit
Admission: RE | Admit: 2019-03-19 | Discharge: 2019-03-19 | Disposition: A | Discharge status: Home / Self Care | Payer: Medicare Other | Source: Ambulatory Visit | Attending: Internal Medicine | Admitting: Internal Medicine

## 2019-03-19 ENCOUNTER — Other Ambulatory Visit: Payer: Self-pay

## 2019-03-19 DIAGNOSIS — M4807 Spinal stenosis, lumbosacral region: Secondary | ICD-10-CM | POA: Insufficient documentation

## 2019-03-19 DIAGNOSIS — I10 Essential (primary) hypertension: Secondary | ICD-10-CM

## 2019-03-21 ENCOUNTER — Other Ambulatory Visit: Payer: Self-pay

## 2019-03-21 ENCOUNTER — Ambulatory Visit: Payer: Medicare Other

## 2019-03-21 DIAGNOSIS — M6281 Muscle weakness (generalized): Secondary | ICD-10-CM

## 2019-03-21 DIAGNOSIS — R2681 Unsteadiness on feet: Secondary | ICD-10-CM

## 2019-03-21 NOTE — Therapy (Signed)
[place] MAIN [facility] SERVICES [address] [place], [place], [address] Phone: [phone]   Fax:  [phone]  Physical Therapy Progress Note/Recertification   Dates of reporting period  12/25/18   to   03/21/19  Patient Details  Name: Eric Torres MRN: [id number] Date of Birth: 1932/01/12 Referring Provider (PT): Mcneil Sober   Encounter Date: 03/21/2019  PT End of Session - 03/21/19 1724    Visit Number  40    Number of Visits  60    Date for PT Re-Evaluation  05/16/19    Authorization Type  Last goals 12/20/18; addressed via telemedicine 01/29/19    PT Start Time  1632    PT Stop Time  1717    PT Time Calculation (min)  45 min    Equipment Utilized During Treatment  Gait belt    Activity Tolerance  Patient tolerated treatment well    Behavior During Therapy  WFL for tasks assessed/performed       Past Medical History:  Diagnosis Date  . Bilateral foot pain    LIKELY NEUROPATHY  . BPH (benign prostatic hyperplasia)   . Cancer (HCC)    ,HX OF PROSTATE CANCER AND BLADDER CANCER  . Collagenous colitis   . COPD (chronic obstructive pulmonary disease) (HCC)    MILD-NO INHALERS PER PT  . Essential tremor   . Frequent falls   . Heart murmur   . History of brachytherapy   . History of TIA (transient ischemic attack) 2015   LEFT THUMB AND INDEX FINGER-SOMETIMES I DROP THINGS  . Hypercholesteremia   . Hypertension   . Hypothyroidism   . PVD (peripheral vascular disease) ([place])   . Stroke ([place])   . Vitamin D deficiency     Past Surgical History:  Procedure Laterality Date  . CHOLECYSTECTOMY    . CYSTOSCOPY WITH BIOPSY N/A 12/29/2015   Procedure: CYSTOSCOPY WITH BIOPSY;  Surgeon: Royston Cowper, MD;  Location: [facility] ORS;  Service: Urology;  Laterality: N/A;  . DIAGNOSTIC LAPAROSCOPY    . ESOPHAGOGASTRODUODENOSCOPY (EGD) WITH PROPOFOL N/A 02/15/2019   Procedure: ESOPHAGOGASTRODUODENOSCOPY (EGD) WITH PROPOFOL;  Surgeon: Lollie Sails, MD;  Location: [facility] ENDOSCOPY;  Service: Endoscopy;  Laterality: N/A;  . EXPLORATORY LAPAROTOMY  1991   VOLVULUS AND BOWEL OBSTRUCTION  . HERNIA REPAIR    . ILIAC ARTERY STENT Bilateral   . TONSILLECTOMY    . TRANSURETHRAL RESECTION OF BLADDER TUMOR N/A 12/29/2015   Procedure: TRANSURETHRAL RESECTION OF BLADDER TUMOR (TURBT)/MITOMYCIN INSTILLATION;  Surgeon: Royston Cowper, MD;  Location: [facility] ORS;  Service: Urology;  Laterality: N/A;  . TRANSURETHRAL RESECTION OF BLADDER TUMOR WITH GYRUS (TURBT-GYRUS)  2013  . VASECTOMY      There were no vitals filed for this visit.  Subjective Assessment - 03/21/19 1635    Subjective  Pt reports that he had his MRI for his low back on Tuesday. They have arranged for him to have a consultation at the end of the month but pt is unsure which discipline he is seeing for his consultation. No falls or LOB since his last therapy session. No pain today and no specific questions or concerns at this time.    Pertinent History  Patient reports his main issue is his balance. States he has a new doctor that refered him to this clinic instead of outpatient in [facility] where he has received physical therapy prior. Has received therapy for balance in the past (approximately 1 year ago). States  he has fallen twice recently when he tripped over leg lift of recliner (6 weeks ago), and fell carrying groceries while trying to open door (4 weeks ago). Reports he does have a fear of falling while walking. States he has circulation issues in his legs that has returned and has not told doctor. Completes water aerobics 2-3x a week. Patient states he also has shoulder pain and back pain, and that shoulder hurts is when he raises arm in abduction against resistance and when rolling over in bed. Used to enjoy playing golf. Would like to be able to walk around the block with his wife.     Limitations  Standing;Walking;House hold activities    How long can you stand comfortably?  5  minutes    How long can you walk comfortably?  5 minutes     Currently in Pain?  No/denies    Pain Onset  --            TREATMENT   Physical Therapy Telehealth Visit:  I connected withMaurice [id number] Webex video conference and verified that I am speaking with the correct person using two identifiers. I discussed the limitations, risks, security and privacy concerns of performing an evaluation and management service by Webex.I also discussed with the patient that there may be a patient responsible charge related to this service. The patient expressed understanding and agreed to proceed.Identified to the patient that therapist is a licensed physical therapist in the state of [place].  Other persons participating in the visit and their role in the encounter:Daughter present but not assisting withencounter Patient's location:Home Patient's address:[address] [name] [address](confirmed in case of emergency) Patient's phone #[phone](confirmed in case of technical difficulties), [phone] [name], daughter); Provider's location:[facility] main OP rehab gym [place] Patient agreed to evaluation/treatment by telemedicine   Ther-Ex Seatedmarches with green tband around knees1 minute x 2; Seated clams with green tband around knees 1 minute x 2; Seated isometric adductor squeeze 3s hold 1 minute x 2; Seated heel raises with self manual resistance 1 minute x 2; Sit to stand without UE support from regular heightas many as possible 1 minute x 2; Standing mini squats x 1 minute;   Neuromuscular Re-education All standing balance exercises performed with back to empty corner of room for safety; Heel/toe raises 3s hold x 1 minute; Full tandem balance eyes open alternating forward LE 30s x 2 on each side; Feet together eyes closed x 30s; Semitandemeyes closed alternating forward LE x 30son each side; Semitandem eyes open  alternating forward LE horizontal head turns 30s x 2 on each side;  Slow marches without UE support, cues to decrease speed as much as possible to challenge SLS x 1 minute;   Pt educated throughout session about proper posture and technique with exercises. Improved exercise technique, movement at target joints, use of target muscles after min to mod verbal cues.   Pt demonstrates good motivation during session. He is able to complete all exercises as instructedby therapist today with improved performance compared to last session. He continues to struggle with his balance especially backwards and pt reports that this is an issue when backing away from the commode.Updated goals today however most objective outcome measures were unable to be completed due to limitations of telemedicine. Pt encouraged to continue HEP including seated exercise vidoes with his daughter. Pt will continue to benefit from skilledPT services to address deficits in strength, balance, and mobility in order to return to full  function at home.                     PT Short Term Goals - 03/21/19 1726      PT SHORT TERM GOAL #1   Title  Patient will be independent with completion of HEP to improve ability to complete functional tasks.    Baseline  HEP compliant; 11/27/18: Pt only performing 3d/wk    Time  4    Period  Weeks    Status  On-going    Target Date  04/18/19      PT SHORT TERM GOAL #2   Title  Patient will report no falls in the next two weeks to demonstrate improved safety and fall risk. 11/27/18: Pt fell Saturday morning while he was putting something in the bottom shelf of refrigerator    Baseline  07/09/18: fell 2x in 6 weeks 1/7: no falls; 11/27/18: Pt fell 3 days prior to treatment; 12/20/18: No falls since 11/24/18    Time  2    Period  Weeks    Status  Achieved    Target Date  12/25/18        PT Long Term Goals - 03/21/19 1726      PT LONG TERM GOAL #1   Title  Patient will  improve 5x sit to stand to <10seconds without UE support to demonstrate improvements in functional strength.     Baseline  07/09/18: 13sec hands on knees 1/7: 13 seconds occasional hand on knee; 11/27/18: 12.3s no UE support, occasional posterior leaning; 12/20/18: 11.9s with occasional posterior leaning; 01/29/19: Unable to assess over telemedicine; 03/21/19: Unable to assess over telemedicine    Time  8    Period  Weeks    Status  Partially Met    Target Date  05/16/19      PT LONG TERM GOAL #2   Title  Patient will increase BERG balance score to >52/56 to decrease fall risk and improve balance.     Baseline  07/09/18: 47/56 1/7: 52/56; 11/27/18: 52/56; 12/20/18: 53/56;    Time  8    Period  Weeks    Status  Achieved      PT LONG TERM GOAL #3   Title  Patient will be able to walk with wife around neighborhood (>16mnutes) to demonstrate improved balance and community ambulation.     Baseline  07/09/18: 539mutes 1/7: wife just returned; 11/27/18: has not attempted; 12/20/18: has not attempted yet; 01/30/19: Has not attempted yet; 03/21/19: Has not attempted yet    Time  8    Period  Weeks    Status  On-going    Target Date  05/16/19      PT LONG TERM GOAL #4   Title  Patient will improve 1075mto >1.40m/21mimiting fall risk and improve community ambulation.     Baseline  07/09/18: 0.833m/52m/7: 1.25 m/s; 11/27/18: 7.9s=1.27 m/s; 12/20/18: Fastest: 7.5s = 1.33 m/s;    Time  6    Period  Weeks    Status  Achieved      PT LONG TERM GOAL #5   Title  Patient will increase DGI to >18/24 to indicate increased stability with dynamic mobility and decrease fall risk.     Baseline  1/7: 14/24; 11/27/18: 17/24; 12/20/18: 20/24;    Time  6    Period  Weeks    Status  Achieved      PT LONG TERM GOAL #6   Title  Pt able to descend 8 steps with single UE assist with no LOB or heel catching on step in order to improve community access and safety on steps.    Baseline  1/7: BUE support and heel catching; 11/27/18:  Heel still catching; 12/20/18: Still requires UE assist; 01/29/19: Unable to assess over telemedicine; 03/21/19: Unable to assess over telemedicine    Time  8    Period  Weeks    Status  On-going    Target Date  05/16/19      PT LONG TERM GOAL #7   Title   Pt will improve DGI by at least 3 points in order to demonstrate clinically significant improvement in balance and decreased risk for falls     Baseline  12/20/18: 20/24; 01/29/19: Unable to assess over telemedicine; 03/21/19: Unable to assess over telemedicine    Time  8    Period  Weeks    Status  On-going    Target Date  05/16/19      PT LONG TERM GOAL #8   Title  Pt will increase 6MWT by at least 50m(1644f in order to demonstrate clinically significant improvement in cardiopulmonary endurance and community ambulation     Baseline  12/20/18: 1070'; 01/29/19: Unable to assess over telemedicine; 12/20/18: 20/24; 01/29/19: Unable to assess over telemedicine; 03/21/19: Unable to assess over telemedicine    Time  8    Period  Weeks    Status  On-going    Target Date  05/16/19            Plan - 03/21/19 1711    Clinical Impression Statement  Pt demonstrates good motivation during session. He is able to complete all exercises as instructed by therapist today with improved performance compared to last session. He continues to struggle with his balance especially backwards and pt reports that this is an issue when backing away from the commode. Updated goals today however most objective outcome measures were unable to be completed due to limitations of telemedicine. Pt encouraged to continue HEP including seated exercise vidoes with his daughter. Pt will continue to benefit from skilled PT services to address deficits in strength, balance, and mobility in order to return to full function at home.    Stability/Clinical Decision Making  Stable/Uncomplicated    Rehab Potential  Good    Clinical Impairments Affecting Rehab Potential  (+) support  system (-) age;     PT Frequency  2x / week    PT Duration  8 weeks    PT Treatment/Interventions  ADLs/Self Care Home Management;Energy conservation;Patient/family education;Therapeutic exercise;Balance training;Neuromuscular re-education;Therapeutic activities;Functional mobility training;Stair training;DME Instruction;Gait training;Aquatic Therapy;Cryotherapy;Electrical Stimulation;Iontophoresis 53m67ml Dexamethasone;Moist Heat;Traction;Ultrasound;Taping;Passive range of motion;Vestibular;Manual techniques    PT Next Visit Plan  strength, balance; static balance while engaged in UE task, head turning activities for balance    PT Home Exercise Plan  Semitandem balance, semitandem balance with horizontal head turns, single leg balance, heel/toe raises ([id number]  Consulted and Agree with Plan of Care  Patient       Patient will benefit from skilled therapeutic intervention in order to improve the following deficits and impairments:  Abnormal gait, Decreased activity tolerance, Decreased balance, Decreased coordination, Decreased endurance, Decreased mobility, Decreased strength, Difficulty walking, Impaired sensation, Pain, Impaired UE functional use, Postural dysfunction, Improper body mechanics, Impaired perceived functional ability, Decreased knowledge of use of DME  Visit Diagnosis: Muscle weakness (generalized)  Unsteadiness on feet    Problem List There are no active problems  to display for this patient.  Phillips Grout PT, DPT, GCS  Huprich,Jason 03/21/2019, 5:32 PM  [place] MAIN [facility] SERVICES [address] [place], [place], [address] Phone: [phone]   Fax:  [phone]  Name: KAIRO LAUBACHER MRN: [id number] Date of Birth: 10-17-1931

## 2019-03-27 ENCOUNTER — Other Ambulatory Visit: Payer: Self-pay

## 2019-03-27 ENCOUNTER — Ambulatory Visit: Payer: Medicare Other

## 2019-03-27 DIAGNOSIS — M6281 Muscle weakness (generalized): Secondary | ICD-10-CM

## 2019-03-27 DIAGNOSIS — R2681 Unsteadiness on feet: Secondary | ICD-10-CM

## 2019-03-27 NOTE — Therapy (Signed)
[place] MAIN [facility] SERVICES [address] [place], [place], [address] Phone: [phone]   Fax:  [phone]  Physical Therapy Treatment  Patient Details  Name: Eric Torres MRN: [id number] Date of Birth: 1932/03/04 Referring Provider (PT): Mcneil Sober   Encounter Date: 03/27/2019  PT End of Session - 03/27/19 1340    Visit Number  41    Number of Visits  60    Date for PT Re-Evaluation  05/16/19    Authorization Type  Last goals: addressed via telemedicine 03/21/19    PT Start Time  1337    PT Stop Time  1422    PT Time Calculation (min)  45 min    Equipment Utilized During Treatment  Gait belt    Activity Tolerance  Patient tolerated treatment well    Behavior During Therapy  WFL for tasks assessed/performed       Past Medical History:  Diagnosis Date  . Bilateral foot pain    LIKELY NEUROPATHY  . BPH (benign prostatic hyperplasia)   . Cancer (HCC)    ,HX OF PROSTATE CANCER AND BLADDER CANCER  . Collagenous colitis   . COPD (chronic obstructive pulmonary disease) (HCC)    MILD-NO INHALERS PER PT  . Essential tremor   . Frequent falls   . Heart murmur   . History of brachytherapy   . History of TIA (transient ischemic attack) 2015   LEFT THUMB AND INDEX FINGER-SOMETIMES I DROP THINGS  . Hypercholesteremia   . Hypertension   . Hypothyroidism   . PVD (peripheral vascular disease) ([place])   . Stroke ([place])   . Vitamin D deficiency     Past Surgical History:  Procedure Laterality Date  . CHOLECYSTECTOMY    . CYSTOSCOPY WITH BIOPSY N/A 12/29/2015   Procedure: CYSTOSCOPY WITH BIOPSY;  Surgeon: Royston Cowper, MD;  Location: [facility] ORS;  Service: Urology;  Laterality: N/A;  . DIAGNOSTIC LAPAROSCOPY    . ESOPHAGOGASTRODUODENOSCOPY (EGD) WITH PROPOFOL N/A 02/15/2019   Procedure: ESOPHAGOGASTRODUODENOSCOPY (EGD) WITH PROPOFOL;  Surgeon: Lollie Sails, MD;  Location: [facility] ENDOSCOPY;  Service: Endoscopy;  Laterality: N/A;  .  EXPLORATORY LAPAROTOMY  1991   VOLVULUS AND BOWEL OBSTRUCTION  . HERNIA REPAIR    . ILIAC ARTERY STENT Bilateral   . TONSILLECTOMY    . TRANSURETHRAL RESECTION OF BLADDER TUMOR N/A 12/29/2015   Procedure: TRANSURETHRAL RESECTION OF BLADDER TUMOR (TURBT)/MITOMYCIN INSTILLATION;  Surgeon: Royston Cowper, MD;  Location: [facility] ORS;  Service: Urology;  Laterality: N/A;  . TRANSURETHRAL RESECTION OF BLADDER TUMOR WITH GYRUS (TURBT-GYRUS)  2013  . VASECTOMY      There were no vitals filed for this visit.  Subjective Assessment - 03/27/19 1339    Subjective  Pt reports that he is doing alright today. No falls or LOB since his last therapy session. No pain today and no specific questions or concerns at this time.    Pertinent History  Patient reports his main issue is his balance. States he has a new doctor that refered him to this clinic instead of outpatient in [facility] where he has received physical therapy prior. Has received therapy for balance in the past (approximately 1 year ago). States he has fallen twice recently when he tripped over leg lift of recliner (6 weeks ago), and fell carrying groceries while trying to open door (4 weeks ago). Reports he does have a fear of falling while walking. States he has circulation issues in his legs that has  returned and has not told doctor. Completes water aerobics 2-3x a week. Patient states he also has shoulder pain and back pain, and that shoulder hurts is when he raises arm in abduction against resistance and when rolling over in bed. Used to enjoy playing golf. Would like to be able to walk around the block with his wife.     Limitations  Standing;Walking;House hold activities    How long can you stand comfortably?  5 minutes    How long can you walk comfortably?  5 minutes     Currently in Pain?  No/denies          TREATMENT   Physical Therapy Telehealth Visit:  I connected withMaurice Borentoday [id number] Webex video conference and  verified that I am speaking with the correct person using two identifiers. I discussed the limitations, risks, security and privacy concerns of performing an evaluation and management service by Webex.I also discussed with the patient that there may be a patient responsible charge related to this service. The patient expressed understanding and agreed to proceed.Identified to the patient that therapist is a licensed physical therapist in the state of [place].  Other persons participating in the visit and their role in the encounter:Daughter present but not assisting withencounter Patient's location:Home Patient's address:[address] [name] [address](confirmed in case of emergency) Patient's phone #[phone](confirmed in case of technical difficulties), [phone] [name], daughter); Provider's location:[facility] main OP rehab gym [place] Patient agreed to evaluation/treatment by telemedicine   Ther-Ex Seatedmarches withgreen tband around knees1 minutex 2; Seated clams with green tband around knees 1 minute x 2; Seated isometric adductor squeeze 3s hold 1 minute x 2; Seatedheel raises with self manual resistance1 minutex 2; Sit to stand without UE support from regular heightas many as possible 1 minute x 2; Standing mini squats x 1 minute;   Neuromuscular Re-education All standing balance exercises performed with back to empty corner of room for safety; Slow marches without UE support, cues to decrease speed as much as possible to challenge SLS x 1 minute; Heel/toe raises 3s hold x 1 minute; Full tandem balance eyes open alternating forward LE 30s x 2 on each side; Semitandemeyes closed alternating forward LE30sx 2 on each side;   Pt educated throughout session about proper posture and technique with exercises. Improved exercise technique, movement at target joints, use of target muscles after min to mod verbal cues.   Pt demonstrates  good motivation during session. He is able to complete all exercises as instructedby therapisttoday. He demonstrates improved stability today in tandem stance compared to previous sessions. He continues to require intermittent seated rest breaks. Pt encouraged to continue HEP including seated exercise vidoes with his daughter. Pt will continue to benefit from skilledPT services to address deficits in strength, balance, and mobility in order to return to full function at home.                       PT Short Term Goals - 03/21/19 1726      PT SHORT TERM GOAL #1   Title  Patient will be independent with completion of HEP to improve ability to complete functional tasks.    Baseline  HEP compliant; 11/27/18: Pt only performing 3d/wk    Time  4    Period  Weeks    Status  On-going    Target Date  04/18/19      PT SHORT TERM GOAL #2   Title  Patient will report no falls in the next two weeks to demonstrate improved safety and fall risk. 11/27/18: Pt fell Saturday morning while he was putting something in the bottom shelf of refrigerator    Baseline  07/09/18: fell 2x in 6 weeks 1/7: no falls; 11/27/18: Pt fell 3 days prior to treatment; 12/20/18: No falls since 11/24/18    Time  2    Period  Weeks    Status  Achieved    Target Date  12/25/18        PT Long Term Goals - 03/21/19 1726      PT LONG TERM GOAL #1   Title  Patient will improve 5x sit to stand to <10seconds without UE support to demonstrate improvements in functional strength.     Baseline  07/09/18: 13sec hands on knees 1/7: 13 seconds occasional hand on knee; 11/27/18: 12.3s no UE support, occasional posterior leaning; 12/20/18: 11.9s with occasional posterior leaning; 01/29/19: Unable to assess over telemedicine; 03/21/19: Unable to assess over telemedicine    Time  8    Period  Weeks    Status  Partially Met    Target Date  05/16/19      PT LONG TERM GOAL #2   Title  Patient will increase BERG balance score to  >52/56 to decrease fall risk and improve balance.     Baseline  07/09/18: 47/56 1/7: 52/56; 11/27/18: 52/56; 12/20/18: 53/56;    Time  8    Period  Weeks    Status  Achieved      PT LONG TERM GOAL #3   Title  Patient will be able to walk with wife around neighborhood (>34mnutes) to demonstrate improved balance and community ambulation.     Baseline  07/09/18: 531mutes 1/7: wife just returned; 11/27/18: has not attempted; 12/20/18: has not attempted yet; 01/30/19: Has not attempted yet; 03/21/19: Has not attempted yet    Time  8    Period  Weeks    Status  On-going    Target Date  05/16/19      PT LONG TERM GOAL #4   Title  Patient will improve 1053mto >1.73m/573mimiting fall risk and improve community ambulation.     Baseline  07/09/18: 0.833m/11m/7: 1.25 m/s; 11/27/18: 7.9s=1.27 m/s; 12/20/18: Fastest: 7.5s = 1.33 m/s;    Time  6    Period  Weeks    Status  Achieved      PT LONG TERM GOAL #5   Title  Patient will increase DGI to >18/24 to indicate increased stability with dynamic mobility and decrease fall risk.     Baseline  1/7: 14/24; 11/27/18: 17/24; 12/20/18: 20/24;    Time  6    Period  Weeks    Status  Achieved      PT LONG TERM GOAL #6   Title  Pt able to descend 8 steps with single UE assist with no LOB or heel catching on step in order to improve community access and safety on steps.    Baseline  1/7: BUE support and heel catching; 11/27/18: Heel still catching; 12/20/18: Still requires UE assist; 01/29/19: Unable to assess over telemedicine; 03/21/19: Unable to assess over telemedicine    Time  8    Period  Weeks    Status  On-going    Target Date  05/16/19      PT LONG TERM GOAL #7   Title   Pt will improve DGI by at least 3 points  in order to demonstrate clinically significant improvement in balance and decreased risk for falls     Baseline  12/20/18: 20/24; 01/29/19: Unable to assess over telemedicine; 03/21/19: Unable to assess over telemedicine    Time  8    Period  Weeks     Status  On-going    Target Date  05/16/19      PT LONG TERM GOAL #8   Title  Pt will increase 6MWT by at least 963m(1657f in order to demonstrate clinically significant improvement in cardiopulmonary endurance and community ambulation     Baseline  12/20/18: 1070'; 01/29/19: Unable to assess over telemedicine; 12/20/18: 20/24; 01/29/19: Unable to assess over telemedicine; 03/21/19: Unable to assess over telemedicine    Time  8    Period  Weeks    Status  On-going    Target Date  05/16/19            Plan - 03/27/19 1341    Clinical Impression Statement  Pt demonstrates good motivation during session. He is able to complete all exercises as instructed by therapist today. He demonstrates improved stability today in tandem stance compared to previous sessions. He continues to require intermittent seated rest breaks. Pt encouraged to continue HEP including seated exercise vidoes with his daughter. Pt will continue to benefit from skilled PT services to address deficits in strength, balance, and mobility in order to return to full function at home.    Stability/Clinical Decision Making  Stable/Uncomplicated    Rehab Potential  Good    Clinical Impairments Affecting Rehab Potential  (+) support system (-) age;     PT Frequency  2x / week    PT Duration  8 weeks    PT Treatment/Interventions  ADLs/Self Care Home Management;Energy conservation;Patient/family education;Therapeutic exercise;Balance training;Neuromuscular re-education;Therapeutic activities;Functional mobility training;Stair training;DME Instruction;Gait training;Aquatic Therapy;Cryotherapy;Electrical Stimulation;Iontophoresis 63m82ml Dexamethasone;Moist Heat;Traction;Ultrasound;Taping;Passive range of motion;Vestibular;Manual techniques    PT Next Visit Plan  strength, balance; static balance while engaged in UE task, head turning activities for balance    PT Home Exercise Plan  Semitandem balance, semitandem balance with horizontal head  turns, single leg balance, heel/toe raises ([id number]  Consulted and Agree with Plan of Care  Patient       Patient will benefit from skilled therapeutic intervention in order to improve the following deficits and impairments:  Abnormal gait, Decreased activity tolerance, Decreased balance, Decreased coordination, Decreased endurance, Decreased mobility, Decreased strength, Difficulty walking, Impaired sensation, Pain, Impaired UE functional use, Postural dysfunction, Improper body mechanics, Impaired perceived functional ability, Decreased knowledge of use of DME  Visit Diagnosis: 1. Muscle weakness (generalized)   2. Unsteadiness on feet        Problem List There are no active problems to display for this patient.  JasPhillips Grout, DPT, GCS  Huprich,Jason 03/27/2019, 3:17 PM  [facility]HLie[facility]irmaryRV[facility] [address] [id number]: [phone]:  [phone]: MauNUCHEM GRATTANN: [phone] of Birth: [birth year]

## 2019-04-03 ENCOUNTER — Other Ambulatory Visit: Payer: Self-pay

## 2019-04-03 ENCOUNTER — Ambulatory Visit: Payer: Medicare Other

## 2019-04-03 DIAGNOSIS — M6281 Muscle weakness (generalized): Secondary | ICD-10-CM | POA: Diagnosis not present

## 2019-04-03 DIAGNOSIS — R2681 Unsteadiness on feet: Secondary | ICD-10-CM

## 2019-04-03 NOTE — Therapy (Signed)
[place] MAIN [facility] SERVICES [address] [place], [place], [address] Phone: [phone]   Fax:  [phone]  Physical Therapy Treatment  Patient Details  Name: Eric Torres MRN: [id number] Date of Birth: 05-Nov-1931 Referring Provider (PT): Eric Torres   Encounter Date: 04/03/2019  PT End of Session - 04/03/19 1638    Visit Number  42    Number of Visits  60    Date for PT Re-Evaluation  05/16/19    Authorization Type  Last goals: addressed via telemedicine 03/21/19    PT Start Time  1630    PT Stop Time  1715    PT Time Calculation (min)  45 min    Equipment Utilized During Treatment  Gait belt    Activity Tolerance  Patient tolerated treatment well    Behavior During Therapy  WFL for tasks assessed/performed       Past Medical History:  Diagnosis Date  . Bilateral foot pain    LIKELY NEUROPATHY  . BPH (benign prostatic hyperplasia)   . Cancer (HCC)    ,HX OF PROSTATE CANCER AND BLADDER CANCER  . Collagenous colitis   . COPD (chronic obstructive pulmonary disease) (HCC)    MILD-NO INHALERS PER PT  . Essential tremor   . Frequent falls   . Heart murmur   . History of brachytherapy   . History of TIA (transient ischemic attack) 2015   LEFT THUMB AND INDEX FINGER-SOMETIMES I DROP THINGS  . Hypercholesteremia   . Hypertension   . Hypothyroidism   . PVD (peripheral vascular disease) ([place])   . Stroke ([place])   . Vitamin D deficiency     Past Surgical History:  Procedure Laterality Date  . CHOLECYSTECTOMY    . CYSTOSCOPY WITH BIOPSY N/A 12/29/2015   Procedure: CYSTOSCOPY WITH BIOPSY;  Surgeon: Eric Cowper, MD;  Location: [facility] ORS;  Service: Urology;  Laterality: N/A;  . DIAGNOSTIC LAPAROSCOPY    . ESOPHAGOGASTRODUODENOSCOPY (EGD) WITH PROPOFOL N/A 02/15/2019   Procedure: ESOPHAGOGASTRODUODENOSCOPY (EGD) WITH PROPOFOL;  Surgeon: Eric Sails, MD;  Location: [facility] ENDOSCOPY;  Service: Endoscopy;  Laterality: N/A;  .  EXPLORATORY LAPAROTOMY  1991   VOLVULUS AND BOWEL OBSTRUCTION  . HERNIA REPAIR    . ILIAC ARTERY STENT Bilateral   . TONSILLECTOMY    . TRANSURETHRAL RESECTION OF BLADDER TUMOR N/A 12/29/2015   Procedure: TRANSURETHRAL RESECTION OF BLADDER TUMOR (TURBT)/MITOMYCIN INSTILLATION;  Surgeon: Eric Cowper, MD;  Location: [facility] ORS;  Service: Urology;  Laterality: N/A;  . TRANSURETHRAL RESECTION OF BLADDER TUMOR WITH GYRUS (TURBT-GYRUS)  2013  . VASECTOMY      There were no vitals filed for this visit.  Subjective Assessment - 04/03/19 1637    Subjective  Pt reports that he is doing alright today. He had another fall in the bathroom since his last therapy session but no injury. He reports that he bent over to pull up his pants and when he stood up he continued to go over backwards. No pain today and no specific questions or concerns at this time.    Pertinent History  Patient reports his main issue is his balance. States he has a new doctor that refered him to this clinic instead of outpatient in [facility] where he has received physical therapy prior. Has received therapy for balance in the past (approximately 1 year ago). States he has fallen twice recently when he tripped over leg lift of recliner (6 weeks ago), and fell carrying groceries  while trying to open door (4 weeks ago). Reports he does have a fear of falling while walking. States he has circulation issues in his legs that has returned and has not told doctor. Completes water aerobics 2-3x a week. Patient states he also has shoulder pain and back pain, and that shoulder hurts is when he raises arm in abduction against resistance and when rolling over in bed. Used to enjoy playing golf. Would like to be able to walk around the block with his wife.     Limitations  Standing;Walking;House hold activities    How long can you stand comfortably?  5 minutes    How long can you walk comfortably?  5 minutes     Currently in Pain?  No/denies           TREATMENT   Physical Therapy Telehealth Visit:  I connected withMaurice Borentoday [id number] Webex video conference and verified that I am speaking with the correct person using two identifiers. I discussed the limitations, risks, security and privacy concerns of performing an evaluation and management service by Webex.I also discussed with the patient that there may be a patient responsible charge related to this service. The patient expressed understanding and agreed to proceed.Identified to the patient that therapist is a licensed physical therapist in the state of [place].  Other persons participating in the visit and their role in the encounter:Daughter present but not assisting withencounter Patient's location:Home Patient's address:[address] Eric Torres [place] [address](confirmed in case of emergency) Patient's phone #[phone](confirmed in case of technical difficulties), [phone] Eric Torres, daughter); Provider's location:[facility] main OP rehab gym Eric Torres [place] Patient agreed to evaluation/treatment by telemedicine   Ther-Ex Standingmarches x1 minute; Standing mini squats 1 minute x 2; Seated clams with green tband around knees 1 minute x 2; Seated isometric adductor squeeze 3s hold 1 minute x 2; Seatedheel raises with self manual resistance1 minutex 2; Sit to stand without UE support from regular heightas many as possible x 1 minute; Standing forward lunges alternating LE x 1 minutes each; Feet together horizontal head and body turns x 1 minute;   Neuromuscular Re-education All standing balance exercises performed with back to empty corner of room for safety; Heel/toe raises 3s hold x 1 minute; Semitandemeyes open horizontal and then vertical head turns alternating forward LE x30s each on each side; Semitandemeyes closed alternating forward LE x30s on each side; Full tandem balance eyes open alternating forward LE x 30s on each  side; Single leg balance alternating LE x 30s total on each side;   Pt educated throughout session about proper posture and technique with exercises. Improved exercise technique, movement at target joints, use of target muscles after min to mod verbal cues.   Pt demonstrates good motivation during session. He is able to complete all exercises as instructedby therapisttoday. He continues to require intermittent seated rest breaks throughout session and appears slightly more fatigued today.Pt encouraged to continue HEP including seated exercise vidoes with his daughter. Pt will continue to benefit from skilledPT services to address deficits in strength, balance, and mobility in order to return to full function at home.           PT Short Term Goals - 03/21/19 1726      PT SHORT TERM GOAL #1   Title  Patient will be independent with completion of HEP to improve ability to complete functional tasks.    Baseline  HEP compliant; 11/27/18: Pt only performing 3d/wk    Time  4  Period  Weeks    Status  On-going    Target Date  04/18/19      PT SHORT TERM GOAL #2   Title  Patient will report no falls in the next two weeks to demonstrate improved safety and fall risk. 11/27/18: Pt fell Saturday morning while he was putting something in the bottom shelf of refrigerator    Baseline  07/09/18: fell 2x in 6 weeks 1/7: no falls; 11/27/18: Pt fell 3 days prior to treatment; 12/20/18: No falls since 11/24/18    Time  2    Period  Weeks    Status  Achieved    Target Date  12/25/18        PT Long Term Goals - 03/21/19 1726      PT LONG TERM GOAL #1   Title  Patient will improve 5x sit to stand to <10seconds without UE support to demonstrate improvements in functional strength.     Baseline  07/09/18: 13sec hands on knees 1/7: 13 seconds occasional hand on knee; 11/27/18: 12.3s no UE support, occasional posterior leaning; 12/20/18: 11.9s with occasional posterior leaning; 01/29/19: Unable to  assess over telemedicine; 03/21/19: Unable to assess over telemedicine    Time  8    Period  Weeks    Status  Partially Met    Target Date  05/16/19      PT LONG TERM GOAL #2   Title  Patient will increase BERG balance score to >52/56 to decrease fall risk and improve balance.     Baseline  07/09/18: 47/56 1/7: 52/56; 11/27/18: 52/56; 12/20/18: 53/56;    Time  8    Period  Weeks    Status  Achieved      PT LONG TERM GOAL #3   Title  Patient will be able to walk with wife around neighborhood (>11mnutes) to demonstrate improved balance and community ambulation.     Baseline  07/09/18: 522mutes 1/7: wife just returned; 11/27/18: has not attempted; 12/20/18: has not attempted yet; 01/30/19: Has not attempted yet; 03/21/19: Has not attempted yet    Time  8    Period  Weeks    Status  On-going    Target Date  05/16/19      PT LONG TERM GOAL #4   Title  Patient will improve 1076mto >1.28m/528mimiting fall risk and improve community ambulation.     Baseline  07/09/18: 0.833m/51m/7: 1.25 m/s; 11/27/18: 7.9s=1.27 m/s; 12/20/18: Fastest: 7.5s = 1.33 m/s;    Time  6    Period  Weeks    Status  Achieved      PT LONG TERM GOAL #5   Title  Patient will increase DGI to >18/24 to indicate increased stability with dynamic mobility and decrease fall risk.     Baseline  1/7: 14/24; 11/27/18: 17/24; 12/20/18: 20/24;    Time  6    Period  Weeks    Status  Achieved      PT LONG TERM GOAL #6   Title  Pt able to descend 8 steps with single UE assist with no LOB or heel catching on step in order to improve community access and safety on steps.    Baseline  1/7: BUE support and heel catching; 11/27/18: Heel still catching; 12/20/18: Still requires UE assist; 01/29/19: Unable to assess over telemedicine; 03/21/19: Unable to assess over telemedicine    Time  8    Period  Weeks    Status  On-going  Target Date  05/16/19      PT LONG TERM GOAL #7   Title   Pt will improve DGI by at least 3 points in order to  demonstrate clinically significant improvement in balance and decreased risk for falls     Baseline  12/20/18: 20/24; 01/29/19: Unable to assess over telemedicine; 03/21/19: Unable to assess over telemedicine    Time  8    Period  Weeks    Status  On-going    Target Date  05/16/19      PT LONG TERM GOAL #8   Title  Pt will increase 6MWT by at least 36m(1669f in order to demonstrate clinically significant improvement in cardiopulmonary endurance and community ambulation     Baseline  12/20/18: 1070'; 01/29/19: Unable to assess over telemedicine; 12/20/18: 20/24; 01/29/19: Unable to assess over telemedicine; 03/21/19: Unable to assess over telemedicine    Time  8    Period  Weeks    Status  On-going    Target Date  05/16/19            Plan - 04/03/19 1639    Clinical Impression Statement  Pt demonstrates good motivation during session. He is able to complete all exercises as instructed by therapist today. He continues to require intermittent seated rest breaks throughout session and appears slightly more fatigued today. Pt encouraged to continue HEP including seated exercise vidoes with his daughter. Pt will continue to benefit from skilled PT services to address deficits in strength, balance, and mobility in order to return to full function at home.    Stability/Clinical Decision Making  Stable/Uncomplicated    Rehab Potential  Good    Clinical Impairments Affecting Rehab Potential  (+) support system (-) age;     PT Frequency  2x / week    PT Duration  8 weeks    PT Treatment/Interventions  ADLs/Self Care Home Management;Energy conservation;Patient/family education;Therapeutic exercise;Balance training;Neuromuscular re-education;Therapeutic activities;Functional mobility training;Stair training;DME Instruction;Gait training;Aquatic Therapy;Cryotherapy;Electrical Stimulation;Iontophoresis 102m29ml Dexamethasone;Moist Heat;Traction;Ultrasound;Taping;Passive range of motion;Vestibular;Manual  techniques    PT Next Visit Plan  strength, balance; static balance while engaged in UE task, head turning activities for balance    PT Home Exercise Plan  Semitandem balance, semitandem balance with horizontal head turns, single leg balance, heel/toe raises ([id number]  Consulted and Agree with Plan of Care  Patient       Patient will benefit from skilled therapeutic intervention in order to improve the following deficits and impairments:  Abnormal gait, Decreased activity tolerance, Decreased balance, Decreased coordination, Decreased endurance, Decreased mobility, Decreased strength, Difficulty walking, Impaired sensation, Pain, Impaired UE functional use, Postural dysfunction, Improper body mechanics, Impaired perceived functional ability, Decreased knowledge of use of DME  Visit Diagnosis: 1. Muscle weakness (generalized)   2. Unsteadiness on feet        Problem List There are no active problems to display for this patient.  JasPhillips Grout, DPT, GCS  Huprich,Jason 04/03/2019, 5:27 PM  [facility] [address] [place]one: [phone]:  [phone]: MauJAMARRIUS SALAYN: [id number] of Birth: [birth year]

## 2019-04-10 ENCOUNTER — Ambulatory Visit: Payer: Medicare Other | Attending: Family Medicine

## 2019-04-10 DIAGNOSIS — R2689 Other abnormalities of gait and mobility: Secondary | ICD-10-CM | POA: Insufficient documentation

## 2019-04-10 DIAGNOSIS — R2681 Unsteadiness on feet: Secondary | ICD-10-CM | POA: Insufficient documentation

## 2019-04-10 DIAGNOSIS — M6281 Muscle weakness (generalized): Secondary | ICD-10-CM | POA: Insufficient documentation

## 2019-04-10 DIAGNOSIS — R269 Unspecified abnormalities of gait and mobility: Secondary | ICD-10-CM | POA: Insufficient documentation

## 2019-04-10 NOTE — Therapy (Signed)
[place] MAIN [facility] SERVICES [address] [place], [place], [address] Phone: [phone]   Fax:  [phone]  Physical Therapy Treatment  Patient Details  Name: Eric Torres MRN: [id number] Date of Birth: [birth year] Referring Provider (PT): Eric Torres   Encounter Date: 04/10/2019  PT End of Session - 04/10/19 1652    Visit Number  43    Number of Visits  60    Date for PT Re-Evaluation  05/16/19    Authorization Type  Last goals: addressed via telemedicine 03/21/19    PT Start Time  1632    PT Stop Time  1715    PT Time Calculation (min)  43 min    Equipment Utilized During Treatment  Gait belt    Activity Tolerance  Patient tolerated treatment well    Behavior During Therapy  WFL for tasks assessed/performed       Past Medical History:  Diagnosis Date  . Bilateral foot pain    LIKELY NEUROPATHY  . BPH (benign prostatic hyperplasia)   . Cancer (HCC)    ,HX OF PROSTATE CANCER AND BLADDER CANCER  . Collagenous colitis   . COPD (chronic obstructive pulmonary disease) (HCC)    MILD-NO INHALERS PER PT  . Essential tremor   . Frequent falls   . Heart murmur   . History of brachytherapy   . History of TIA (transient ischemic attack) 2015   LEFT THUMB AND INDEX FINGER-SOMETIMES I DROP THINGS  . Hypercholesteremia   . Hypertension   . Hypothyroidism   . PVD (peripheral vascular disease) ([place])   . Stroke ([place])   . Vitamin D deficiency     Past Surgical History:  Procedure Laterality Date  . CHOLECYSTECTOMY    . CYSTOSCOPY WITH BIOPSY N/A 12/29/2015   Procedure: CYSTOSCOPY WITH BIOPSY;  Surgeon: Eric Cowper, MD;  Location: [facility] ORS;  Service: Urology;  Laterality: N/A;  . DIAGNOSTIC LAPAROSCOPY    . ESOPHAGOGASTRODUODENOSCOPY (EGD) WITH PROPOFOL N/A 02/15/2019   Procedure: ESOPHAGOGASTRODUODENOSCOPY (EGD) WITH PROPOFOL;  Surgeon: Eric Sails, MD;  Location: [facility] ENDOSCOPY;  Service: Endoscopy;  Laterality: N/A;  .  EXPLORATORY LAPAROTOMY  1991   VOLVULUS AND BOWEL OBSTRUCTION  . HERNIA REPAIR    . ILIAC ARTERY STENT Bilateral   . TONSILLECTOMY    . TRANSURETHRAL RESECTION OF BLADDER TUMOR N/A 12/29/2015   Procedure: TRANSURETHRAL RESECTION OF BLADDER TUMOR (TURBT)/MITOMYCIN INSTILLATION;  Surgeon: Eric Cowper, MD;  Location: [facility] ORS;  Service: Urology;  Laterality: N/A;  . TRANSURETHRAL RESECTION OF BLADDER TUMOR WITH GYRUS (TURBT-GYRUS)  2013  . VASECTOMY      There were no vitals filed for this visit.  Subjective Assessment - 04/10/19 1640    Subjective  Pt reports that he is doing alright today. He denies any back pain or additional pain at rest at start of session today. He saw Dr. Sharlet Torres last week who recommended he start using a walker and also scheduled him for [facility] for his low back pain. Pt reports that he has an appointment with him sometime in the next 3 weeks.  No specific questions or concerns at this time.    Pertinent History  Patient reports his main issue is his balance. States he has a new doctor that refered him to this clinic instead of outpatient in [facility] where he has received physical therapy prior. Has received therapy for balance in the past (approximately 1 year ago). States he has fallen twice recently when  he tripped over leg lift of recliner (6 weeks ago), and fell carrying groceries while trying to open door (4 weeks ago). Reports he does have a fear of falling while walking. States he has circulation issues in his legs that has returned and has not told doctor. Completes water aerobics 2-3x a week. Patient states he also has shoulder pain and back pain, and that shoulder hurts is when he raises arm in abduction against resistance and when rolling over in bed. Used to enjoy playing golf. Would like to be able to walk around the block with his wife.     Limitations  Standing;Walking;House hold activities    How long can you stand comfortably?  5 minutes    How long can you walk  comfortably?  5 minutes     Currently in Pain?  No/denies           TREATMENT   Physical Therapy Telehealth Visit:  I connected withMaurice Borentoday [id number] Webex video conference and verified that I am speaking with the correct person using two identifiers. I discussed the limitations, risks, security and privacy concerns of performing an evaluation and management service by Webex.I also discussed with the patient that there may be a patient responsible charge related to this service. The patient expressed understanding and agreed to proceed.Identified to the patient that therapist is a licensed physical therapist in the state of [place].  Other persons participating in the visit and their role in the encounter:Daughter present but not assisting withencounter Patient's location:Home Patient's address:[address] [name] [address](confirmed in case of emergency) Patient's phone #[phone](confirmed in case of technical difficulties), [phone] Eric Torres, daughter); Provider's location:[facility] main OP rehab gym [place] Patient agreed to evaluation/treatment by telemedicine   Ther-Ex Standingmarches x1 minute; Standing mini squats x 1 minute; Seated clams with green tband around knees x 1 minute; Seated marches with green tband around knees x 1 minute; Seated isometric adductor squeeze 3s hold x 1 minute; Sit to stand without UE support from regular heightas many as possible x 1 minute;   Neuromuscular Re-education Extensive education performed with patient regarding the difference between a front wheeled walker and a 4 wheeled rollator. Discussed pros and cons of each as well as how to utilize best. Agree that pt would benefit from use of assistive device especially when walking in the community.  All standing balance exercises performed with back to empty corner of room for safety; Heel/toe raises 3s hold x 1 minute; Feet together  horizontal head and body turns x 1 minute; Feet together vertical head turns x 1 minute; Semitandemeyes open horizontal and then vertical head turns alternating forward LE x30s each on each side; Semitandemeyes closed alternating forward LE x30s on each side;   Pt educated throughout session about proper posture and technique with exercises. Improved exercise technique, movement at target joints, use of target muscles after min to mod verbal cues.   Pt demonstrates good motivation during session. He is able to complete all exercises as instructedby therapisttoday. He continues to require intermittent seated rest breaks throughout session however appears less fatigued today. Extensive education performed with patient regarding the difference between a front wheeled walker and a 4 wheeled rollator. Discussed pros and cons of each as well as how to utilize best. Agree that pt would benefit from use of assistive device especially when walking in the community.Pt encouraged to continue HEP. He will continue to benefit from skilledPT services to address deficits in strength,  balance, and mobility in order to return to full function at home.                      PT Short Term Goals - 03/21/19 1726      PT SHORT TERM GOAL #1   Title  Patient will be independent with completion of HEP to improve ability to complete functional tasks.    Baseline  HEP compliant; 11/27/18: Pt only performing 3d/wk    Time  4    Period  Weeks    Status  On-going    Target Date  04/18/19      PT SHORT TERM GOAL #2   Title  Patient will report no falls in the next two weeks to demonstrate improved safety and fall risk. 11/27/18: Pt fell Saturday morning while he was putting something in the bottom shelf of refrigerator    Baseline  07/09/18: fell 2x in 6 weeks 1/7: no falls; 11/27/18: Pt fell 3 days prior to treatment; 12/20/18: No falls since 11/24/18    Time  2    Period  Weeks    Status   Achieved    Target Date  12/25/18        PT Long Term Goals - 03/21/19 1726      PT LONG TERM GOAL #1   Title  Patient will improve 5x sit to stand to <10seconds without UE support to demonstrate improvements in functional strength.     Baseline  07/09/18: 13sec hands on knees 1/7: 13 seconds occasional hand on knee; 11/27/18: 12.3s no UE support, occasional posterior leaning; 12/20/18: 11.9s with occasional posterior leaning; 01/29/19: Unable to assess over telemedicine; 03/21/19: Unable to assess over telemedicine    Time  8    Period  Weeks    Status  Partially Met    Target Date  05/16/19      PT LONG TERM GOAL #2   Title  Patient will increase BERG balance score to >52/56 to decrease fall risk and improve balance.     Baseline  07/09/18: 47/56 1/7: 52/56; 11/27/18: 52/56; 12/20/18: 53/56;    Time  8    Period  Weeks    Status  Achieved      PT LONG TERM GOAL #3   Title  Patient will be able to walk with wife around neighborhood (>46mnutes) to demonstrate improved balance and community ambulation.     Baseline  07/09/18: 528mutes 1/7: wife just returned; 11/27/18: has not attempted; 12/20/18: has not attempted yet; 01/30/19: Has not attempted yet; 03/21/19: Has not attempted yet    Time  8    Period  Weeks    Status  On-going    Target Date  05/16/19      PT LONG TERM GOAL #4   Title  Patient will improve 1017mto >1.67m/66mimiting fall risk and improve community ambulation.     Baseline  07/09/18: 0.833m/867m/7: 1.25 m/s; 11/27/18: 7.9s=1.27 m/s; 12/20/18: Fastest: 7.5s = 1.33 m/s;    Time  6    Period  Weeks    Status  Achieved      PT LONG TERM GOAL #5   Title  Patient will increase DGI to >18/24 to indicate increased stability with dynamic mobility and decrease fall risk.     Baseline  1/7: 14/24; 11/27/18: 17/24; 12/20/18: 20/24;    Time  6    Period  Weeks    Status  Achieved  PT LONG TERM GOAL #6   Title  Pt able to descend 8 steps with single UE assist with no LOB or heel  catching on step in order to improve community access and safety on steps.    Baseline  1/7: BUE support and heel catching; 11/27/18: Heel still catching; 12/20/18: Still requires UE assist; 01/29/19: Unable to assess over telemedicine; 03/21/19: Unable to assess over telemedicine    Time  8    Period  Weeks    Status  On-going    Target Date  05/16/19      PT LONG TERM GOAL #7   Title   Pt will improve DGI by at least 3 points in order to demonstrate clinically significant improvement in balance and decreased risk for falls     Baseline  12/20/18: 20/24; 01/29/19: Unable to assess over telemedicine; 03/21/19: Unable to assess over telemedicine    Time  8    Period  Weeks    Status  On-going    Target Date  05/16/19      PT LONG TERM GOAL #8   Title  Pt will increase 6MWT by at least 29m(168f in order to demonstrate clinically significant improvement in cardiopulmonary endurance and community ambulation     Baseline  12/20/18: 1070'; 01/29/19: Unable to assess over telemedicine; 12/20/18: 20/24; 01/29/19: Unable to assess over telemedicine; 03/21/19: Unable to assess over telemedicine    Time  8    Period  Weeks    Status  On-going    Target Date  05/16/19            Plan - 04/10/19 1653    Clinical Impression Statement  Pt demonstrates good motivation during session. He is able to complete all exercises as instructed by therapist today. He continues to require intermittent seated rest breaks throughout session however appears less fatigued today. Extensive education performed with patient regarding the difference between a front wheeled walker and a 4 wheeled rollator. Discussed pros and cons of each as well as how to utilize best. Agree that pt would benefit from use of assistive device especially when walking in the community. Pt encouraged to continue HEP. He will continue to benefit from skilled PT services to address deficits in strength, balance, and mobility in order to return to full  function at home.    Stability/Clinical Decision Making  Stable/Uncomplicated    Rehab Potential  Good    Clinical Impairments Affecting Rehab Potential  (+) support system (-) age;     PT Frequency  2x / week    PT Duration  8 weeks    PT Treatment/Interventions  ADLs/Self Care Home Management;Energy conservation;Patient/family education;Therapeutic exercise;Balance training;Neuromuscular re-education;Therapeutic activities;Functional mobility training;Stair training;DME Instruction;Gait training;Aquatic Therapy;Cryotherapy;Electrical Stimulation;Iontophoresis 65m88ml Dexamethasone;Moist Heat;Traction;Ultrasound;Taping;Passive range of motion;Vestibular;Manual techniques    PT Next Visit Plan  strength, balance; static balance while engaged in UE task, head turning activities for balance    PT Home Exercise Plan  Semitandem balance, semitandem balance with horizontal head turns, single leg balance, heel/toe raises ([id number]  Consulted and Agree with Plan of Care  Patient       Patient will benefit from skilled therapeutic intervention in order to improve the following deficits and impairments:  Abnormal gait, Decreased activity tolerance, Decreased balance, Decreased coordination, Decreased endurance, Decreased mobility, Decreased strength, Difficulty walking, Impaired sensation, Pain, Impaired UE functional use, Postural dysfunction, Improper body mechanics, Impaired perceived functional ability, Decreased knowledge of use of DME  Visit Diagnosis: 1.  Muscle weakness (generalized)   2. Unsteadiness on feet   3. Gait difficulty        Problem List There are no active problems to display for this patient.  Phillips Grout PT, DPT, GCS  Julliana Whitmyer 04/11/2019, 9:31 AM  [place] MAIN [facility] SERVICES [address] [place], [place], [address] Phone: [phone]   Fax:  [phone]  Name: Eric Torres MRN: [id number] Date of Birth:  02/10/1932

## 2019-04-16 ENCOUNTER — Ambulatory Visit: Payer: Medicare Other

## 2019-04-16 ENCOUNTER — Other Ambulatory Visit: Payer: Self-pay

## 2019-04-16 DIAGNOSIS — R2681 Unsteadiness on feet: Secondary | ICD-10-CM

## 2019-04-16 DIAGNOSIS — M6281 Muscle weakness (generalized): Secondary | ICD-10-CM

## 2019-04-16 NOTE — Therapy (Signed)
[place] MAIN [facility] SERVICES [address] [place], [place], [address] Phone: [phone]   Fax:  [phone]  Physical Therapy Treatment  Patient Details  Name: Eric Torres MRN: [id number] Date of Birth: 02-Apr-1932 Referring Provider (PT): Mcneil Sober   Encounter Date: 04/16/2019  PT End of Session - 04/16/19 1649    Visit Number  44    Number of Visits  60    Date for PT Re-Evaluation  05/16/19    Authorization Type  Last goals: addressed via telemedicine 03/21/19    PT Start Time  1630    PT Stop Time  1715    PT Time Calculation (min)  45 min    Equipment Utilized During Treatment  Gait belt    Activity Tolerance  Patient tolerated treatment well    Behavior During Therapy  WFL for tasks assessed/performed       Past Medical History:  Diagnosis Date  . Bilateral foot pain    LIKELY NEUROPATHY  . BPH (benign prostatic hyperplasia)   . Cancer (HCC)    ,HX OF PROSTATE CANCER AND BLADDER CANCER  . Collagenous colitis   . COPD (chronic obstructive pulmonary disease) (HCC)    MILD-NO INHALERS PER PT  . Essential tremor   . Frequent falls   . Heart murmur   . History of brachytherapy   . History of TIA (transient ischemic attack) 2015   LEFT THUMB AND INDEX FINGER-SOMETIMES I DROP THINGS  . Hypercholesteremia   . Hypertension   . Hypothyroidism   . PVD (peripheral vascular disease) ([place])   . Stroke ([place])   . Vitamin D deficiency     Past Surgical History:  Procedure Laterality Date  . CHOLECYSTECTOMY    . CYSTOSCOPY WITH BIOPSY N/A 12/29/2015   Procedure: CYSTOSCOPY WITH BIOPSY;  Surgeon: Royston Cowper, MD;  Location: [facility] ORS;  Service: Urology;  Laterality: N/A;  . DIAGNOSTIC LAPAROSCOPY    . ESOPHAGOGASTRODUODENOSCOPY (EGD) WITH PROPOFOL N/A 02/15/2019   Procedure: ESOPHAGOGASTRODUODENOSCOPY (EGD) WITH PROPOFOL;  Surgeon: Lollie Sails, MD;  Location: [facility] ENDOSCOPY;  Service: Endoscopy;  Laterality: N/A;  .  EXPLORATORY LAPAROTOMY  1991   VOLVULUS AND BOWEL OBSTRUCTION  . HERNIA REPAIR    . ILIAC ARTERY STENT Bilateral   . TONSILLECTOMY    . TRANSURETHRAL RESECTION OF BLADDER TUMOR N/A 12/29/2015   Procedure: TRANSURETHRAL RESECTION OF BLADDER TUMOR (TURBT)/MITOMYCIN INSTILLATION;  Surgeon: Royston Cowper, MD;  Location: [facility] ORS;  Service: Urology;  Laterality: N/A;  . TRANSURETHRAL RESECTION OF BLADDER TUMOR WITH GYRUS (TURBT-GYRUS)  2013  . VASECTOMY      There were no vitals filed for this visit.  Subjective Assessment - 04/16/19 1647    Subjective  Pt reports that he is doing alright today. He denies any back pain or additional pain at rest at start of session today. He has not scheduled his spinal injections yet. He did have an episodes where he felt like he was losing his balance while standing in front of his kitchen sink at home but it was only brief and then he recovered his balance. No falls since his last therapy session.  No specific questions or concerns at this time.    Pertinent History  Patient reports his main issue is his balance. States he has a new doctor that refered him to this clinic instead of outpatient in [facility] where he has received physical therapy prior. Has received therapy for balance in the past (  approximately 1 year ago). States he has fallen twice recently when he tripped over leg lift of recliner (6 weeks ago), and fell carrying groceries while trying to open door (4 weeks ago). Reports he does have a fear of falling while walking. States he has circulation issues in his legs that has returned and has not told doctor. Completes water aerobics 2-3x a week. Patient states he also has shoulder pain and back pain, and that shoulder hurts is when he raises arm in abduction against resistance and when rolling over in bed. Used to enjoy playing golf. Would like to be able to walk around the block with his wife.     Limitations  Standing;Walking;House hold activities    How  long can you stand comfortably?  5 minutes    How long can you walk comfortably?  5 minutes     Currently in Pain?  No/denies            TREATMENT   Physical Therapy Telehealth Visit:  I connected withMaurice Borentoday [id number] Webex video conference and verified that I am speaking with the correct person using two identifiers. I discussed the limitations, risks, security and privacy concerns of performing an evaluation and management service by Webex.I also discussed with the patient that there may be a patient responsible charge related to this service. The patient expressed understanding and agreed to proceed.Identified to the patient that therapist is a licensed physical therapist in the state of [place].  Other persons participating in the visit and their role in the encounter:Daughter present but not assisting withencounter. 2 student PTs present with therapist but not participating in the encounter Patient's location:Home Patient's address:[address] [name] [address](confirmed in case of emergency) Patient's phone #[phone](confirmed in case of technical difficulties), [phone] Eric Torres, daughter); Provider's location:[facility] main OP rehab gym [place] Patient agreed to evaluation/treatment by telemedicine   Ther-Ex Seatedmarches with green tbandx1 minute; Standing mini squats 1 minute x 2; Seated clams with green tband around knees x 1 minute; Seated marches with green tband around knees x 1 minute; Seated isometric adductor squeeze 3s hold x 1 minute; Sit to stand without UE support from regular heightas many as possible1 minute x 2; Forward lunges alternating lead LE x 30s each;   Neuromuscular Re-education All standing balance exercises performed with back to empty corner of room for safety; Heel/toe raises 3s hold x 1 minute; Slow standing marches without UE support x 1 minute challenging SLS; Feet together with  eyes closed x 1 minute; Semitandemeyes closed alternating forward LEx30s on each side; Semitandemeyesopen horizontal and then vertical head turnsalternating forward LEx30seachon each side; Full tandem balance with eyes open alternating forward LE x 30s each;   Pt educated throughout session about proper posture and technique with exercises. Improved exercise technique, movement at target joints, use of target muscles after min to mod verbal cues.   Pt demonstrates good motivation during session. He is able to complete all exercises as instructedby therapisttoday. He continues to require intermittent seated rest breaksthroughout session however appears less fatigued today than during previous sessions. He demonstrates improved balance today in semitandem and full tandem stances. Pt encouraged to continue HEP. He will continue to benefit from skilledPT services to address deficits in strength, balance, and mobility in order to return to full function at home.                      PT Short Term Goals -  03/21/19 1726      PT SHORT TERM GOAL #1   Title  Patient will be independent with completion of HEP to improve ability to complete functional tasks.    Baseline  HEP compliant; 11/27/18: Pt only performing 3d/wk    Time  4    Period  Weeks    Status  On-going    Target Date  04/18/19      PT SHORT TERM GOAL #2   Title  Patient will report no falls in the next two weeks to demonstrate improved safety and fall risk. 11/27/18: Pt fell Saturday morning while he was putting something in the bottom shelf of refrigerator    Baseline  07/09/18: fell 2x in 6 weeks 1/7: no falls; 11/27/18: Pt fell 3 days prior to treatment; 12/20/18: No falls since 11/24/18    Time  2    Period  Weeks    Status  Achieved    Target Date  12/25/18        PT Long Term Goals - 03/21/19 1726      PT LONG TERM GOAL #1   Title  Patient will improve 5x sit to stand to <10seconds without  UE support to demonstrate improvements in functional strength.     Baseline  07/09/18: 13sec hands on knees 1/7: 13 seconds occasional hand on knee; 11/27/18: 12.3s no UE support, occasional posterior leaning; 12/20/18: 11.9s with occasional posterior leaning; 01/29/19: Unable to assess over telemedicine; 03/21/19: Unable to assess over telemedicine    Time  8    Period  Weeks    Status  Partially Met    Target Date  05/16/19      PT LONG TERM GOAL #2   Title  Patient will increase BERG balance score to >52/56 to decrease fall risk and improve balance.     Baseline  07/09/18: 47/56 1/7: 52/56; 11/27/18: 52/56; 12/20/18: 53/56;    Time  8    Period  Weeks    Status  Achieved      PT LONG TERM GOAL #3   Title  Patient will be able to walk with wife around neighborhood (>8mnutes) to demonstrate improved balance and community ambulation.     Baseline  07/09/18: 577mutes 1/7: wife just returned; 11/27/18: has not attempted; 12/20/18: has not attempted yet; 01/30/19: Has not attempted yet; 03/21/19: Has not attempted yet    Time  8    Period  Weeks    Status  On-going    Target Date  05/16/19      PT LONG TERM GOAL #4   Title  Patient will improve 10350mto >1.50m/97mimiting fall risk and improve community ambulation.     Baseline  07/09/18: 0.833m/3m/7: 1.25 m/s; 11/27/18: 7.9s=1.27 m/s; 12/20/18: Fastest: 7.5s = 1.33 m/s;    Time  6    Period  Weeks    Status  Achieved      PT LONG TERM GOAL #5   Title  Patient will increase DGI to >18/24 to indicate increased stability with dynamic mobility and decrease fall risk.     Baseline  1/7: 14/24; 11/27/18: 17/24; 12/20/18: 20/24;    Time  6    Period  Weeks    Status  Achieved      PT LONG TERM GOAL #6   Title  Pt able to descend 8 steps with single UE assist with no LOB or heel catching on step in order to improve community access and safety on  steps.    Baseline  1/7: BUE support and heel catching; 11/27/18: Heel still catching; 12/20/18: Still requires  UE assist; 01/29/19: Unable to assess over telemedicine; 03/21/19: Unable to assess over telemedicine    Time  8    Period  Weeks    Status  On-going    Target Date  05/16/19      PT LONG TERM GOAL #7   Title   Pt will improve DGI by at least 3 points in order to demonstrate clinically significant improvement in balance and decreased risk for falls     Baseline  12/20/18: 20/24; 01/29/19: Unable to assess over telemedicine; 03/21/19: Unable to assess over telemedicine    Time  8    Period  Weeks    Status  On-going    Target Date  05/16/19      PT LONG TERM GOAL #8   Title  Pt will increase 6MWT by at least 22m(1677f in order to demonstrate clinically significant improvement in cardiopulmonary endurance and community ambulation     Baseline  12/20/18: 1070'; 01/29/19: Unable to assess over telemedicine; 12/20/18: 20/24; 01/29/19: Unable to assess over telemedicine; 03/21/19: Unable to assess over telemedicine    Time  8    Period  Weeks    Status  On-going    Target Date  05/16/19            Plan - 04/16/19 1650    Clinical Impression Statement  Pt demonstrates good motivation during session. He is able to complete all exercises as instructed by therapist today. He continues to require intermittent seated rest breaks throughout session however appears less fatigued today than during previous sessions. He demonstrates improved balance today in semitandem and full tandem stances. Pt encouraged to continue HEP. He will continue to benefit from skilled PT services to address deficits in strength, balance, and mobility in order to return to full function at home.    Stability/Clinical Decision Making  Stable/Uncomplicated    Rehab Potential  Good    Clinical Impairments Affecting Rehab Potential  (+) support system (-) age;     PT Frequency  2x / week    PT Duration  8 weeks    PT Treatment/Interventions  ADLs/Self Care Home Management;Energy conservation;Patient/family education;Therapeutic  exercise;Balance training;Neuromuscular re-education;Therapeutic activities;Functional mobility training;Stair training;DME Instruction;Gait training;Aquatic Therapy;Cryotherapy;Electrical Stimulation;Iontophoresis 28m50ml Dexamethasone;Moist Heat;Traction;Ultrasound;Taping;Passive range of motion;Vestibular;Manual techniques    PT Next Visit Plan  strength, balance; static balance while engaged in UE task, head turning activities for balance    PT Home Exercise Plan  Semitandem balance, semitandem balance with horizontal head turns, single leg balance, heel/toe raises ([id number]  Consulted and Agree with Plan of Care  Patient       Patient will benefit from skilled therapeutic intervention in order to improve the following deficits and impairments:  Abnormal gait, Decreased activity tolerance, Decreased balance, Decreased coordination, Decreased endurance, Decreased mobility, Decreased strength, Difficulty walking, Impaired sensation, Pain, Impaired UE functional use, Postural dysfunction, Improper body mechanics, Impaired perceived functional ability, Decreased knowledge of use of DME  Visit Diagnosis: 1. Muscle weakness (generalized)   2. Unsteadiness on feet        Problem List There are no active problems to display for this patient.  JasPhillips Grout, DPT, GCS  Eric Torres 04/16/2019, 8:44 PM  [place] REHSter[facility] HospitalRVICES [address] [place]: [phone]:  [phone]: Eric Speigner  Torres MRN: [id number] Date of Birth: [birth year]

## 2019-04-23 ENCOUNTER — Ambulatory Visit: Payer: Medicare Other

## 2019-04-25 ENCOUNTER — Ambulatory Visit: Payer: Medicare Other

## 2019-04-25 DIAGNOSIS — R2689 Other abnormalities of gait and mobility: Secondary | ICD-10-CM

## 2019-04-25 DIAGNOSIS — R2681 Unsteadiness on feet: Secondary | ICD-10-CM

## 2019-04-25 DIAGNOSIS — R269 Unspecified abnormalities of gait and mobility: Secondary | ICD-10-CM

## 2019-04-25 DIAGNOSIS — M6281 Muscle weakness (generalized): Secondary | ICD-10-CM | POA: Diagnosis not present

## 2019-04-25 NOTE — Therapy (Signed)
[place] MAIN [facility] SERVICES [address] [place], [place], [address] Phone: [phone]   Fax:  [phone]  Physical Therapy Treatment  Patient Details  Name: Eric Torres MRN: [id number] Date of Birth: September 17, 1932 Referring Provider (PT): Mcneil Sober   Encounter Date: 04/25/2019  PT End of Session - 04/25/19 1713    Visit Number  45    Number of Visits  60    Date for PT Re-Evaluation  05/16/19    Authorization Type  Last goals: addressed via telemedicine 03/21/19    PT Start Time  1700    PT Stop Time  1730    PT Time Calculation (min)  30 min    Equipment Utilized During Treatment  Gait belt    Activity Tolerance  Patient tolerated treatment well    Behavior During Therapy  WFL for tasks assessed/performed       Past Medical History:  Diagnosis Date  . Bilateral foot pain    LIKELY NEUROPATHY  . BPH (benign prostatic hyperplasia)   . Cancer (HCC)    ,HX OF PROSTATE CANCER AND BLADDER CANCER  . Collagenous colitis   . COPD (chronic obstructive pulmonary disease) (HCC)    MILD-NO INHALERS PER PT  . Essential tremor   . Frequent falls   . Heart murmur   . History of brachytherapy   . History of TIA (transient ischemic attack) 2015   LEFT THUMB AND INDEX FINGER-SOMETIMES I DROP THINGS  . Hypercholesteremia   . Hypertension   . Hypothyroidism   . PVD (peripheral vascular disease) ([place])   . Stroke ([place])   . Vitamin D deficiency     Past Surgical History:  Procedure Laterality Date  . CHOLECYSTECTOMY    . CYSTOSCOPY WITH BIOPSY N/A 12/29/2015   Procedure: CYSTOSCOPY WITH BIOPSY;  Surgeon: Royston Cowper, MD;  Location: [facility] ORS;  Service: Urology;  Laterality: N/A;  . DIAGNOSTIC LAPAROSCOPY    . ESOPHAGOGASTRODUODENOSCOPY (EGD) WITH PROPOFOL N/A 02/15/2019   Procedure: ESOPHAGOGASTRODUODENOSCOPY (EGD) WITH PROPOFOL;  Surgeon: Lollie Sails, MD;  Location: [facility] ENDOSCOPY;  Service: Endoscopy;  Laterality: N/A;  .  EXPLORATORY LAPAROTOMY  1991   VOLVULUS AND BOWEL OBSTRUCTION  . HERNIA REPAIR    . ILIAC ARTERY STENT Bilateral   . TONSILLECTOMY    . TRANSURETHRAL RESECTION OF BLADDER TUMOR N/A 12/29/2015   Procedure: TRANSURETHRAL RESECTION OF BLADDER TUMOR (TURBT)/MITOMYCIN INSTILLATION;  Surgeon: Royston Cowper, MD;  Location: [facility] ORS;  Service: Urology;  Laterality: N/A;  . TRANSURETHRAL RESECTION OF BLADDER TUMOR WITH GYRUS (TURBT-GYRUS)  2013  . VASECTOMY      There were no vitals filed for this visit.  Subjective Assessment - 04/25/19 1703    Subjective  Pt reports experiencing some dizziness over the last couple of days when getting up from a seated position. Pt denies any LBP at this current time. Pt has an appointment set up with physician for next week to discuss spinal injections, but does not have an appointment for the injections set up at this time.    Pertinent History  Patient reports his main issue is his balance. States he has a new doctor that refered him to this clinic instead of outpatient in [facility] where he has received physical therapy prior. Has received therapy for balance in the past (approximately 1 year ago). States he has fallen twice recently when he tripped over leg lift of recliner (6 weeks ago), and fell carrying groceries while trying  to open door (4 weeks ago). Reports he does have a fear of falling while walking. States he has circulation issues in his legs that has returned and has not told doctor. Completes water aerobics 2-3x a week. Patient states he also has shoulder pain and back pain, and that shoulder hurts is when he raises arm in abduction against resistance and when rolling over in bed. Used to enjoy playing golf. Would like to be able to walk around the block with his wife.     Limitations  Standing;Walking;House hold activities    How long can you stand comfortably?  5 minutes    How long can you walk comfortably?  5 minutes     Currently in Pain?  No/denies          TREATMENT   Physical Therapy Telehealth Visit:  I connected withMaurice [id number] Webex video conference and verified that I am speaking with the correct person using two identifiers. I discussed the limitations, risks, security and privacy concerns of performing an evaluation and management service by Webex.I also discussed with the patient that there may be a patient responsible charge related to this service. The patient expressed understanding and agreed to proceed.Identified to the patient that therapist is a licensed physical therapist in the state of [place].  Other persons participating in the visit and their role in the encounter:Daughter present but not assisting withencounter. 2 student PTs present with therapist; only 1 student participating in the encounter confirmed with patient Patient's location:Home Patient's address:[address] [name] [address](confirmed in case of emergency) Patient's phone #[phone](confirmed in case of technical difficulties), [phone] [name], daughter); Provider's location:[facility] main OP rehab gym [place] Patient agreed to evaluation/treatment by telemedicine   Ther-Ex Seated clams with green tband around kneesx1 minute; Seated marches with green tband around knees x 1 minute; Seated isometric adductor squeeze 3s holdx1 minute; Sit to stand without UE support from regular heightas many as possible1 minute x 2; with diminished height of STS due to fatigue and weakness  Forward lunges alternating lead LE x 30s each;   Neuromuscular Re-education All standing balance exercises performed with back to empty corner of room for safety; Heel/toe raises 3s hold x 1 minute; Slow standing marches without UE support x 1 minute challenging SLS; Feet together with eyes closed x 1 minute; posterior swaying  Semitandemeyes closed alternating forward LEx30s on each side; LOB  x2 Semitandemeyesopen horizontal and then vertical head turnsalternating forward LEx30seachon each side;   Pt educated throughout session about proper posture and technique with exercises. Improved exercise technique, movement at target joints, use of target muscles after min to mod verbal cues.   Pt demonstrates good motivation during session. Physical therapy session was shortened due to technological issues with telehealth software. He is able to complete all exercises as instructedby therapisttoday. He continues to require intermittent seated rest breaksthroughout session. Pt continues to have LOB posteriorly occasionally with all balance exercises, however, remains safe during exercises due to proper patient education and location where exercises take place. Pt encouraged to continue HEP.Hewill continue to benefit from skilledPT services to address deficits in strength, balance, and mobility in order to return to full function at home.         PT Short Term Goals - 03/21/19 1726      PT SHORT TERM GOAL #1   Title  Patient will be independent with completion of HEP to improve ability to complete functional tasks.  Baseline  HEP compliant; 11/27/18: Pt only performing 3d/wk    Time  4    Period  Weeks    Status  On-going    Target Date  04/18/19      PT SHORT TERM GOAL #2   Title  Patient will report no falls in the next two weeks to demonstrate improved safety and fall risk. 11/27/18: Pt fell Saturday morning while he was putting something in the bottom shelf of refrigerator    Baseline  07/09/18: fell 2x in 6 weeks 1/7: no falls; 11/27/18: Pt fell 3 days prior to treatment; 12/20/18: No falls since 11/24/18    Time  2    Period  Weeks    Status  Achieved    Target Date  12/25/18        PT Long Term Goals - 03/21/19 1726      PT LONG TERM GOAL #1   Title  Patient will improve 5x sit to stand to <10seconds without UE support to demonstrate improvements in  functional strength.     Baseline  07/09/18: 13sec hands on knees 1/7: 13 seconds occasional hand on knee; 11/27/18: 12.3s no UE support, occasional posterior leaning; 12/20/18: 11.9s with occasional posterior leaning; 01/29/19: Unable to assess over telemedicine; 03/21/19: Unable to assess over telemedicine    Time  8    Period  Weeks    Status  Partially Met    Target Date  05/16/19      PT LONG TERM GOAL #2   Title  Patient will increase BERG balance score to >52/56 to decrease fall risk and improve balance.     Baseline  07/09/18: 47/56 1/7: 52/56; 11/27/18: 52/56; 12/20/18: 53/56;    Time  8    Period  Weeks    Status  Achieved      PT LONG TERM GOAL #3   Title  Patient will be able to walk with wife around neighborhood (>755mnutes) to demonstrate improved balance and community ambulation.     Baseline  07/09/18: 575mutes 1/7: wife just returned; 11/27/18: has not attempted; 12/20/18: has not attempted yet; 01/30/19: Has not attempted yet; 03/21/19: Has not attempted yet    Time  8    Period  Weeks    Status  On-going    Target Date  05/16/19      PT LONG TERM GOAL #4   Title  Patient will improve 1063mto >1.55m/57mimiting fall risk and improve community ambulation.     Baseline  07/09/18: 0.833m/49m/7: 1.25 m/s; 11/27/18: 7.9s=1.27 m/s; 12/20/18: Fastest: 7.5s = 1.33 m/s;    Time  6    Period  Weeks    Status  Achieved      PT LONG TERM GOAL #5   Title  Patient will increase DGI to >18/24 to indicate increased stability with dynamic mobility and decrease fall risk.     Baseline  1/7: 14/24; 11/27/18: 17/24; 12/20/18: 20/24;    Time  6    Period  Weeks    Status  Achieved      PT LONG TERM GOAL #6   Title  Pt able to descend 8 steps with single UE assist with no LOB or heel catching on step in order to improve community access and safety on steps.    Baseline  1/7: BUE support and heel catching; 11/27/18: Heel still catching; 12/20/18: Still requires UE assist; 01/29/19: Unable to assess over  telemedicine; 03/21/19: Unable to assess over telemedicine  Time  8    Period  Weeks    Status  On-going    Target Date  05/16/19      PT LONG TERM GOAL #7   Title   Pt will improve DGI by at least 3 points in order to demonstrate clinically significant improvement in balance and decreased risk for falls     Baseline  12/20/18: 20/24; 01/29/19: Unable to assess over telemedicine; 03/21/19: Unable to assess over telemedicine    Time  8    Period  Weeks    Status  On-going    Target Date  05/16/19      PT LONG TERM GOAL #8   Title  Pt will increase 6MWT by at least 55m(1634f in order to demonstrate clinically significant improvement in cardiopulmonary endurance and community ambulation     Baseline  12/20/18: 1070'; 01/29/19: Unable to assess over telemedicine; 12/20/18: 20/24; 01/29/19: Unable to assess over telemedicine; 03/21/19: Unable to assess over telemedicine    Time  8    Period  Weeks    Status  On-going    Target Date  05/16/19            Plan - 04/25/19 1703    Clinical Impression Statement  Pt demonstrates good motivation during session. Physical therapy session was shortened due to technological issues with telehealth software. He is able to complete all exercises as instructed by therapist today. He continues to require intermittent seated rest breaks throughout session. Pt continues to have LOB posteriorly occasionally with all balance exercises, however, remains safe during exercises due to proper patient education and location where exercises take place. Pt encouraged to continue HEP. He will continue to benefit from skilled PT services to address deficits in strength, balance, and mobility in order to return to full function at home.    Stability/Clinical Decision Making  Stable/Uncomplicated    Rehab Potential  Good    Clinical Impairments Affecting Rehab Potential  (+) support system (-) age;     PT Frequency  2x / week    PT Duration  8 weeks    PT  Treatment/Interventions  ADLs/Self Care Home Management;Energy conservation;Patient/family education;Therapeutic exercise;Balance training;Neuromuscular re-education;Therapeutic activities;Functional mobility training;Stair training;DME Instruction;Gait training;Aquatic Therapy;Cryotherapy;Electrical Stimulation;Iontophoresis '4mg'$ /ml Dexamethasone;Moist Heat;Traction;Ultrasound;Taping;Passive range of motion;Vestibular;Manual techniques    PT Next Visit Plan  strength, balance; static balance while engaged in UE task, head turning activities for balance    PT Home Exercise Plan  Semitandem balance, semitandem balance with horizontal head turns, single leg balance, heel/toe raises ([id number]   Consulted and Agree with Plan of Care  Patient       Patient will benefit from skilled therapeutic intervention in order to improve the following deficits and impairments:  Abnormal gait, Decreased activity tolerance, Decreased balance, Decreased coordination, Decreased endurance, Decreased mobility, Decreased strength, Difficulty walking, Impaired sensation, Pain, Impaired UE functional use, Postural dysfunction, Improper body mechanics, Impaired perceived functional ability, Decreased knowledge of use of DME  Visit Diagnosis: 1. Muscle weakness (generalized)   2. Unsteadiness on feet   3. Gait difficulty   4. Loss of balance        Problem List There are no active problems to display for this patient.   This entire session was performed under direct supervision and direction of a licensed therapist/therapist assistant . I have personally read, edited and approve of the note as written.    NiElmyra Ricksurcotte SPT JaLyndel Safeuprich PT, DPT, GCS  Huprich,Jason 04/26/2019, 1:55 PM  [place] [facility] SERVICES [address] [place], [place], [address] Phone: [phone]   Fax:  [phone]  Name: KAEVION SINCLAIR MRN: [id number] Date of Birth: 06-14-1932

## 2019-05-02 ENCOUNTER — Ambulatory Visit: Payer: Medicare Other

## 2019-05-02 ENCOUNTER — Other Ambulatory Visit: Payer: Self-pay

## 2019-05-02 DIAGNOSIS — M6281 Muscle weakness (generalized): Secondary | ICD-10-CM

## 2019-05-02 DIAGNOSIS — R2689 Other abnormalities of gait and mobility: Secondary | ICD-10-CM

## 2019-05-02 DIAGNOSIS — R269 Unspecified abnormalities of gait and mobility: Secondary | ICD-10-CM

## 2019-05-02 DIAGNOSIS — R2681 Unsteadiness on feet: Secondary | ICD-10-CM

## 2019-05-02 NOTE — Therapy (Signed)
[place] MAIN [facility] SERVICES [address] [place], [place], [address] Phone: [phone]   Fax:  [phone]  Physical Therapy Treatment  Patient Details  Name: Eric Torres MRN: [id number] Date of Birth: 06-Sep-1932 Referring Provider (PT): Mcneil Sober   Encounter Date: 05/02/2019  PT End of Session - 05/02/19 1251    Visit Number  [id number]    Number of Visits  60    Date for PT Re-Evaluation  05/16/19    Authorization Type  Last goals: addressed via telemedicine 03/21/19    PT Start Time  1300    PT Stop Time  1345    PT Time Calculation (min)  45 min    Equipment Utilized During Treatment  Gait belt    Activity Tolerance  Patient tolerated treatment well    Behavior During Therapy  WFL for tasks assessed/performed       Past Medical History:  Diagnosis Date  . Bilateral foot pain    LIKELY NEUROPATHY  . BPH (benign prostatic hyperplasia)   . Cancer (HCC)    ,HX OF PROSTATE CANCER AND BLADDER CANCER  . Collagenous colitis   . COPD (chronic obstructive pulmonary disease) (HCC)    MILD-NO INHALERS PER PT  . Essential tremor   . Frequent falls   . Heart murmur   . History of brachytherapy   . History of TIA (transient ischemic attack) 2015   LEFT THUMB AND INDEX FINGER-SOMETIMES I DROP THINGS  . Hypercholesteremia   . Hypertension   . Hypothyroidism   . PVD (peripheral vascular disease) ([place])   . Stroke ([place])   . Vitamin D deficiency     Past Surgical History:  Procedure Laterality Date  . CHOLECYSTECTOMY    . CYSTOSCOPY WITH BIOPSY N/A 12/29/2015   Procedure: CYSTOSCOPY WITH BIOPSY;  Surgeon: Royston Cowper, MD;  Location: [facility] ORS;  Service: Urology;  Laterality: N/A;  . DIAGNOSTIC LAPAROSCOPY    . ESOPHAGOGASTRODUODENOSCOPY (EGD) WITH PROPOFOL N/A 02/15/2019   Procedure: ESOPHAGOGASTRODUODENOSCOPY (EGD) WITH PROPOFOL;  Surgeon: Lollie Sails, MD;  Location: [facility] ENDOSCOPY;  Service: Endoscopy;  Laterality: N/A;  .  EXPLORATORY LAPAROTOMY  1991   VOLVULUS AND BOWEL OBSTRUCTION  . HERNIA REPAIR    . ILIAC ARTERY STENT Bilateral   . TONSILLECTOMY    . TRANSURETHRAL RESECTION OF BLADDER TUMOR N/A 12/29/2015   Procedure: TRANSURETHRAL RESECTION OF BLADDER TUMOR (TURBT)/MITOMYCIN INSTILLATION;  Surgeon: Royston Cowper, MD;  Location: [facility] ORS;  Service: Urology;  Laterality: N/A;  . TRANSURETHRAL RESECTION OF BLADDER TUMOR WITH GYRUS (TURBT-GYRUS)  2013  . VASECTOMY      There were no vitals filed for this visit.  Subjective Assessment - 05/02/19 1252    Subjective  Pt reports falling this morning around 4:30am. He got up to use the rest room and couldn't find the door so went back to his room and reached out to grab the side of the bed but was not where he thought he was in relation to the bed and fell face first. He has a bruise on his forehead as a result but reports no numbness and tingling, loss/blurred vision, unsteadiness, dizziness, headache, muscle weakness. He reports seeing his physician on Tuesday 04/30/19 and has a f/u scheduled for 05/27/19 and recieved two spinal injections. He reports receiving a back brace and walker about two weeks ago. He wears the back brace intermittently and uses the walker for community ambulation.    Pertinent History  Patient  reports his main issue is his balance. States he has a new doctor that refered him to this clinic instead of outpatient in [facility] where he has received physical therapy prior. Has received therapy for balance in the past (approximately 1 year ago). States he has fallen twice recently when he tripped over leg lift of recliner (6 weeks ago), and fell carrying groceries while trying to open door (4 weeks ago). Reports he does have a fear of falling while walking. States he has circulation issues in his legs that has returned and has not told doctor. Completes water aerobics 2-3x a week. Patient states he also has shoulder pain and back pain, and that shoulder  hurts is when he raises arm in abduction against resistance and when rolling over in bed. Used to enjoy playing golf. Would like to be able to walk around the block with his wife.     Limitations  Standing;Walking;House hold activities    How long can you stand comfortably?  5 minutes    How long can you walk comfortably?  5 minutes          TREATMENT   Physical Therapy Telehealth Visit:   I connected with Rikki Smestad today at 1300 by Webex video conference and verified that I am speaking with the correct person using two identifiers.  I discussed the limitations, risks, security and privacy concerns of performing an evaluation and management service by Webex. I also discussed with the patient that there may be a patient responsible charge related to this service. The patient expressed understanding and agreed to proceed. Identified to the patient that therapist is a licensed physical therapist in the state of [place].   Other persons participating in the visit and their role in the encounter: Daughter present but not assisting with encounter. 2 student PTs present with therapist; only 1 student participating in the encounter confirmed with patient Patient's location: Home Patient's address: [address] [name] [address] (confirmed in case of emergency) Patient's phone #: [phone] (confirmed in case of technical difficulties), [phone] [name], daughter); Provider's location: [place] [facility] Patient agreed to evaluation/treatment by telemedicine     Therapeutic Exercise Seated clams with green tband around knees x 1 minute; Seated marches with green tband around knees x 1 minute; Seated isometric adductor squeeze 3s hold x 1 minute; Sit to stand without UE support from regular height as many as possible 1x1 minute 1x45 seconds; with diminished height of STS due to fatigue and weakness  Forward lunges alternating lead LE x1 minute  each; Standingmarchesx1 minute; Standing mini squats x 1 minute;    Neuromuscular Re-education  All standing balance exercises performed with back to empty corner of room for safety; Heel/toe raises 3s hold x 1 minute; with unilateral UE support Feet together with eyes closed x 1 minute; posterior swaying  Semitandem eyes closed alternating forward LE x 1 minute on each side; LOB x2 Semitandem eyes open horizontal and then vertical head turns alternating forward LE x30s each on each side;     Pt educated throughout session about proper posture and technique with exercises. Improved exercise technique, movement at target joints, use of target muscles after min to mod verbal cues.      Pt demonstrates good motivation during session. He is able to complete all exercises as instructed by therapist today. He continues to require intermittent seated rest breaks throughout session. Pt continues to have LOB posteriorly occasionally with all  balance exercises and standing mini-squats, however, remains safe during exercises due to proper patient education and location where exercises take place. He demonstrates better eccentric control during first set of sit to stands, however fatigues quickly during second set due to muscle weakness demonstrated by diminished height of sit to stand, decreased hip extensor moment, labored breathing, and voluntarily stopping before time elapsed. He reported a fall from this morning at 4:30am and hit his head resulting in a bruise. He reported no weakness, dizziness, loss of/blurred vision, or headache but was encouraged to go to the ED if such symptoms begin. Pt encouraged to continue HEP. He will continue to benefit from skilled PT services to address deficits in strength, balance, and mobility in order to return to full function at home.        PT Short Term Goals - 03/21/19 1726      PT SHORT TERM GOAL #1   Title  Patient will be independent with completion of  HEP to improve ability to complete functional tasks.    Baseline  HEP compliant; 11/27/18: Pt only performing 3d/wk    Time  4    Period  Weeks    Status  On-going    Target Date  04/18/19      PT SHORT TERM GOAL #2   Title  Patient will report no falls in the next two weeks to demonstrate improved safety and fall risk. 11/27/18: Pt fell Saturday morning while he was putting something in the bottom shelf of refrigerator    Baseline  07/09/18: fell 2x in 6 weeks 1/7: no falls; 11/27/18: Pt fell 3 days prior to treatment; 12/20/18: No falls since 11/24/18    Time  2    Period  Weeks    Status  Achieved    Target Date  12/25/18        PT Long Term Goals - 03/21/19 1726      PT LONG TERM GOAL #1   Title  Patient will improve 5x sit to stand to <10seconds without UE support to demonstrate improvements in functional strength.     Baseline  07/09/18: 13sec hands on knees 1/7: 13 seconds occasional hand on knee; 11/27/18: 12.3s no UE support, occasional posterior leaning; 12/20/18: 11.9s with occasional posterior leaning; 01/29/19: Unable to assess over telemedicine; 03/21/19: Unable to assess over telemedicine    Time  8    Period  Weeks    Status  Partially Met    Target Date  05/16/19      PT LONG TERM GOAL #2   Title  Patient will increase BERG balance score to >52/56 to decrease fall risk and improve balance.     Baseline  07/09/18: 47/56 1/7: 52/56; 11/27/18: 52/56; 12/20/18: 53/56;    Time  8    Period  Weeks    Status  Achieved      PT LONG TERM GOAL #3   Title  Patient will be able to walk with wife around neighborhood (>51mnutes) to demonstrate improved balance and community ambulation.     Baseline  07/09/18: 5645mutes 1/7: wife just returned; 11/27/18: has not attempted; 12/20/18: has not attempted yet; 01/30/19: Has not attempted yet; 03/21/19: Has not attempted yet    Time  8    Period  Weeks    Status  On-going    Target Date  05/16/19      PT LONG TERM GOAL #4   Title  Patient will  improve 1065mto >1.45m/56mimiting  fall risk and improve community ambulation.     Baseline  07/09/18: 0.815ms; 1/7: 1.25 m/s; 11/27/18: 7.9s=1.27 m/s; 12/20/18: Fastest: 7.5s = 1.33 m/s;    Time  6    Period  Weeks    Status  Achieved      PT LONG TERM GOAL #5   Title  Patient will increase DGI to >18/24 to indicate increased stability with dynamic mobility and decrease fall risk.     Baseline  1/7: 14/24; 11/27/18: 17/24; 12/20/18: 20/24;    Time  6    Period  Weeks    Status  Achieved      PT LONG TERM GOAL #6   Title  Pt able to descend 8 steps with single UE assist with no LOB or heel catching on step in order to improve community access and safety on steps.    Baseline  1/7: BUE support and heel catching; 11/27/18: Heel still catching; 12/20/18: Still requires UE assist; 01/29/19: Unable to assess over telemedicine; 03/21/19: Unable to assess over telemedicine    Time  8    Period  Weeks    Status  On-going    Target Date  05/16/19      PT LONG TERM GOAL #7   Title   Pt will improve DGI by at least 3 points in order to demonstrate clinically significant improvement in balance and decreased risk for falls     Baseline  12/20/18: 20/24; 01/29/19: Unable to assess over telemedicine; 03/21/19: Unable to assess over telemedicine    Time  8    Period  Weeks    Status  On-going    Target Date  05/16/19      PT LONG TERM GOAL #8   Title  Pt will increase 6MWT by at least [id number] order to demonstrate clinically significant improvement in cardiopulmonary endurance and community ambulation     Baseline  12/20/18: 1070'; 01/29/19: Unable to assess over telemedicine; 12/20/18: 20/24; 01/29/19: Unable to assess over telemedicine; 03/21/19: Unable to assess over telemedicine    Time  8    Period  Weeks    Status  On-going    Target Date  05/16/19            Plan - 05/02/19 1252    Clinical Impression Statement  Pt demonstrates good motivation during session. He is able to complete all  exercises as instructed by therapist today. He continues to require intermittent seated rest breaks throughout session. Pt continues to have LOB posteriorly occasionally with all balance exercises and standing mini-squats, however, remains safe during exercises due to proper patient education and location where exercises take place. He demonstrates better eccentric control during first set of sit to stands, however fatigues quickly during second set due to muscle weakness demonstrated by diminished height of sit to stand, decreased hip extensor moment, labored breathing, and voluntarily stopping before time elapsed. He reported a fall from this morning at 4:30am and hit his head resulting in a bruise. He reported no weakness, dizziness, loss of/blurred vision, or headache but was encouraged to go to the ED if such symptoms begin. Pt encouraged to continue HEP. He will continue to benefit from skilled PT services to address deficits in strength, balance, and mobility in order to return to full function at home.    Stability/Clinical Decision Making  Stable/Uncomplicated    Rehab Potential  Good    Clinical Impairments Affecting Rehab Potential  (+) support system (-) age;  PT Frequency  2x / week    PT Duration  8 weeks    PT Treatment/Interventions  ADLs/Self Care Home Management;Energy conservation;Patient/family education;Therapeutic exercise;Balance training;Neuromuscular re-education;Therapeutic activities;Functional mobility training;Stair training;DME Instruction;Gait training;Aquatic Therapy;Cryotherapy;Electrical Stimulation;Iontophoresis 33m/ml Dexamethasone;Moist Heat;Traction;Ultrasound;Taping;Passive range of motion;Vestibular;Manual techniques    PT Next Visit Plan  strength, balance; static balance while engaged in UE task, head turning activities for balance    PT Home Exercise Plan  Semitandem balance, semitandem balance with horizontal head turns, single leg balance, heel/toe raises  (([id number]    Consulted and Agree with Plan of Care  Patient       Patient will benefit from skilled therapeutic intervention in order to improve the following deficits and impairments:  Abnormal gait, Decreased activity tolerance, Decreased balance, Decreased coordination, Decreased endurance, Decreased mobility, Decreased strength, Difficulty walking, Impaired sensation, Pain, Impaired UE functional use, Postural dysfunction, Improper body mechanics, Impaired perceived functional ability, Decreased knowledge of use of DME  Visit Diagnosis: 1. Muscle weakness (generalized)   2. Unsteadiness on feet   3. Gait difficulty   4. Loss of balance   5. Impairment of balance        Problem List There are no active problems to display for this patient.  This entire session was performed under direct supervision and direction of a licensed therapist/therapist assistant . I have personally read, edited and approve of the note as written.   NElmyra RicksTurcotte SPT JPhillips GroutPT, DPT, GCS  Huprich,Jason 05/03/2019, 12:40 PM  [place] [facility] [address] [address]Phone: [phone]  Fax:  [phone] Name: Eric FAWVERMRN: [id number] of Birth: 728-Aug-1933

## 2019-05-07 ENCOUNTER — Other Ambulatory Visit: Payer: Self-pay

## 2019-05-07 ENCOUNTER — Ambulatory Visit: Payer: Medicare Other

## 2019-05-07 DIAGNOSIS — R2681 Unsteadiness on feet: Secondary | ICD-10-CM

## 2019-05-07 DIAGNOSIS — R2689 Other abnormalities of gait and mobility: Secondary | ICD-10-CM

## 2019-05-07 DIAGNOSIS — M6281 Muscle weakness (generalized): Secondary | ICD-10-CM

## 2019-05-07 NOTE — Therapy (Signed)
[facility] SERVICES [address] [place], [place], [address] Phone: [phone]   Fax:  [phone]  Physical Therapy Treatment  Patient Details  Name: Eric Torres MRN: [id number] Date of Birth: [birth year] Referring Provider (PT): Mcneil Sober   Encounter Date: 05/07/2019  PT End of Session - 05/07/19 1540    Visit Number  53    Number of Visits  60    Date for PT Re-Evaluation  05/16/19    Authorization Type  Last goals: addressed via telemedicine 03/21/19    PT Start Time  0355    PT Stop Time  0430    PT Time Calculation (min)  35 min    Equipment Utilized During Treatment  Gait belt    Activity Tolerance  Patient tolerated treatment well    Behavior During Therapy  WFL for tasks assessed/performed       Past Medical History:  Diagnosis Date  . Bilateral foot pain    LIKELY NEUROPATHY  . BPH (benign prostatic hyperplasia)   . Cancer (HCC)    ,HX OF PROSTATE CANCER AND BLADDER CANCER  . Collagenous colitis   . COPD (chronic obstructive pulmonary disease) (HCC)    MILD-NO INHALERS PER PT  . Essential tremor   . Frequent falls   . Heart murmur   . History of brachytherapy   . History of TIA (transient ischemic attack) 2015   LEFT THUMB AND INDEX FINGER-SOMETIMES I DROP THINGS  . Hypercholesteremia   . Hypertension   . Hypothyroidism   . PVD (peripheral vascular disease) ([place])   . Stroke ([place])   . Vitamin D deficiency     Past Surgical History:  Procedure Laterality Date  . CHOLECYSTECTOMY    . CYSTOSCOPY WITH BIOPSY N/A 12/29/2015   Procedure: CYSTOSCOPY WITH BIOPSY;  Surgeon: Royston Cowper, MD;  Location: [facility] ORS;  Service: Urology;  Laterality: N/A;  . DIAGNOSTIC LAPAROSCOPY    . ESOPHAGOGASTRODUODENOSCOPY (EGD) WITH PROPOFOL N/A 02/15/2019   Procedure: ESOPHAGOGASTRODUODENOSCOPY (EGD) WITH PROPOFOL;  Surgeon: Lollie Sails, MD;  Location: [facility] ENDOSCOPY;  Service: Endoscopy;  Laterality: N/A;  .  EXPLORATORY LAPAROTOMY  1991   VOLVULUS AND BOWEL OBSTRUCTION  . HERNIA REPAIR    . ILIAC ARTERY STENT Bilateral   . TONSILLECTOMY    . TRANSURETHRAL RESECTION OF BLADDER TUMOR N/A 12/29/2015   Procedure: TRANSURETHRAL RESECTION OF BLADDER TUMOR (TURBT)/MITOMYCIN INSTILLATION;  Surgeon: Royston Cowper, MD;  Location: [facility] ORS;  Service: Urology;  Laterality: N/A;  . TRANSURETHRAL RESECTION OF BLADDER TUMOR WITH GYRUS (TURBT-GYRUS)  2013  . VASECTOMY      There were no vitals filed for this visit.  Subjective Assessment - 05/07/19 1539    Subjective  Pt reports feeling weak this morning when he stood up from a seated position. He did not LOB but felt unsteady. Since then, he is feeling better but is fearful of feeling unsteady again. He reports no noticeable difference since spinal injection at this time but understands the effects may take some time.    Pertinent History  Patient reports his main issue is his balance. States he has a new doctor that refered him to this clinic instead of outpatient in [facility] where he has received physical therapy prior. Has received therapy for balance in the past (approximately 1 year ago). States he has fallen twice recently when he tripped over leg lift of recliner (6 weeks ago), and fell carrying groceries while trying to open  door (4 weeks ago). Reports he does have a fear of falling while walking. States he has circulation issues in his legs that has returned and has not told doctor. Completes water aerobics 2-3x a week. Patient states he also has shoulder pain and back pain, and that shoulder hurts is when he raises arm in abduction against resistance and when rolling over in bed. Used to enjoy playing golf. Would like to be able to walk around the block with his wife.     Limitations  Standing;Walking;House hold activities    How long can you stand comfortably?  5 minutes    How long can you walk comfortably?  5 minutes     Currently in Pain?  Yes    Pain  Score  4     Pain Location  Back    Pain Orientation  Lower    Pain Descriptors / Indicators  Aching    Pain Type  Chronic pain    Pain Onset  More than a month ago    Pain Frequency  Intermittent        TREATMENT   Physical Therapy Telehealth Visit:   I connected with Sade Mehlhoff today at [phone] by [facility] video conference and verified that I am speaking with the correct person using two identifiers.  I discussed the limitations, risks, security and privacy concerns of performing an evaluation and management service by [employer]. I also discussed with the patient that there may be a patient responsible charge related to this service. The patient expressed understanding and agreed to proceed. Identified to the patient that therapist is a licensed physical therapist in the state of [place].   Other persons participating in the visit and their role in the encounter: Daughter present but not assisting with encounter. 1 student PT present with primary therapist and participating in the encounter. Patient's location: Home Patient's address: [address] [name] [address] (confirmed in case of emergency) Patient's phone #: [phone] (confirmed in case of technical difficulties), [phone] [name], daughter); Provider's location: [place] main [facility] rehab gym [place] Patient agreed to evaluation/treatment by telemedicine     Therapeutic Exercise Seated clams with green tband around knees x 1 minute; Seated marches with green tband around knees x 1 minute; Seated isometric adductor squeeze 3s hold x 1 minute; Sit to stand without UE support from regular height as many as possible x1 minute; with diminished height of STS due to fatigue and weakness  Forward lunges alternating lead LE x1 minute each; mod-max bilateral UE support required Slow standing marches for increased single leg stance time x 1 minute; Standing mini squats x 30s; tends to LOB posteriorly, dec hip extension  strength Standing bilateral reaching contralateral side x1 minute; decreased truncal rotation noted   Standing bilateral UE reaching into shoulder flexion with feet together x1 minute; dec thoracic extension noted    Neuromuscular Re-education  All standing balance exercises performed with back to empty corner of room for safety; pt utilizes UE against corner of room for stability Heel/toe raises 3s hold x 1 minute; with unilateral UE support Feet together with eyes closed x 1 minute; posterior swaying  Semitandem eyes closed alternating forward LE x 1 minute on each side; pt demonstrates mod swaying  Semitandem eyes open horizontal and then vertical head turns alternating forward LE x1 minute each on each side; Single leg balance alternating LE x30s each LE;    Pt educated throughout session about proper posture and technique  with exercises. Improved exercise technique, movement at target joints, use of target muscles after min to mod verbal cues.      Pt demonstrates good motivation during session. He is able to complete all exercises as instructed by therapist today. He continues to require intermittent seated rest breaks throughout session. Pt appeared heavily fatigued today compared to usual which could be due to feeling weaker this morning prior to PT. Pt demonstrates moderate difficulty with sit to stand exercise needing multiple attempts to raise out of regular height chair. He has difficulty with anterior weight shifting to gain momentum to raise from sit to stand. Pt continues to demonstrates posteriorly LOB during balance exercises needing the wall as a safety to help correct position. He does not demonstrate a stepping strategy to regain balance indicating decreased reaction time. Pt demonstrates difficulty with head turns in both directions as he tends to posteriorly LOB intermittently throughout exercise. Pt demonstrates decreased truncal rotation during standing contralateral  reaching. Pt encouraged to continue HEP. He will continue to benefit from skilled PT services to address deficits in strength, balance, and mobility in order to return to full function at home.       PT Short Term Goals - 03/21/19 1726      PT SHORT TERM GOAL #1   Title  Patient will be independent with completion of HEP to improve ability to complete functional tasks.    Baseline  HEP compliant; 11/27/18: Pt only performing 3d/wk    Time  4    Period  Weeks    Status  On-going    Target Date  04/18/19      PT SHORT TERM GOAL #2   Title  Patient will report no falls in the next two weeks to demonstrate improved safety and fall risk. 11/27/18: Pt fell Saturday morning while he was putting something in the bottom shelf of refrigerator    Baseline  07/09/18: fell 2x in 6 weeks 1/7: no falls; 11/27/18: Pt fell 3 days prior to treatment; 12/20/18: No falls since 11/24/18    Time  2    Period  Weeks    Status  Achieved    Target Date  12/25/18        PT Long Term Goals - 03/21/19 1726      PT LONG TERM GOAL #1   Title  Patient will improve 5x sit to stand to <10seconds without UE support to demonstrate improvements in functional strength.     Baseline  07/09/18: 13sec hands on knees 1/7: 13 seconds occasional hand on knee; 11/27/18: 12.3s no UE support, occasional posterior leaning; 12/20/18: 11.9s with occasional posterior leaning; 01/29/19: Unable to assess over telemedicine; 03/21/19: Unable to assess over telemedicine    Time  8    Period  Weeks    Status  Partially Met    Target Date  05/16/19      PT LONG TERM GOAL #2   Title  Patient will increase BERG balance score to >52/56 to decrease fall risk and improve balance.     Baseline  07/09/18: 47/56 1/7: 52/56; 11/27/18: 52/56; 12/20/18: 53/56;    Time  8    Period  Weeks    Status  Achieved      PT LONG TERM GOAL #3   Title  Patient will be able to walk with wife around neighborhood (>54mnutes) to demonstrate improved balance and  community ambulation.     Baseline  07/09/18: 582mutes 1/7: wife just returned; 11/27/18:  has not attempted; 12/20/18: has not attempted yet; 01/30/19: Has not attempted yet; 03/21/19: Has not attempted yet    Time  8    Period  Weeks    Status  On-going    Target Date  05/16/19      PT LONG TERM GOAL #4   Title  Patient will improve 32mt to >1.062m limiting fall risk and improve community ambulation.     Baseline  07/09/18: 0.83346m 1/7: 1.25 m/s; 11/27/18: 7.9s=1.27 m/s; 12/20/18: Fastest: 7.5s = 1.33 m/s;    Time  6    Period  Weeks    Status  Achieved      PT LONG TERM GOAL #5   Title  Patient will increase DGI to >18/24 to indicate increased stability with dynamic mobility and decrease fall risk.     Baseline  1/7: 14/24; 11/27/18: 17/24; 12/20/18: 20/24;    Time  6    Period  Weeks    Status  Achieved      PT LONG TERM GOAL #6   Title  Pt able to descend 8 steps with single UE assist with no LOB or heel catching on step in order to improve community access and safety on steps.    Baseline  1/7: BUE support and heel catching; 11/27/18: Heel still catching; 12/20/18: Still requires UE assist; 01/29/19: Unable to assess over telemedicine; 03/21/19: Unable to assess over telemedicine    Time  8    Period  Weeks    Status  On-going    Target Date  05/16/19      PT LONG TERM GOAL #7   Title   Pt will improve DGI by at least 3 points in order to demonstrate clinically significant improvement in balance and decreased risk for falls     Baseline  12/20/18: 20/24; 01/29/19: Unable to assess over telemedicine; 03/21/19: Unable to assess over telemedicine    Time  8    Period  Weeks    Status  On-going    Target Date  05/16/19      PT LONG TERM GOAL #8   Title  Pt will increase 6MWT by at least [id number] order to demonstrate clinically significant improvement in cardiopulmonary endurance and community ambulation     Baseline  12/20/18: 1070'; 01/29/19: Unable to assess over telemedicine;  12/20/18: 20/24; 01/29/19: Unable to assess over telemedicine; 03/21/19: Unable to assess over telemedicine    Time  8    Period  Weeks    Status  On-going    Target Date  05/16/19            Plan - 05/07/19 1540    Clinical Impression Statement  Pt demonstrates good motivation during session. He is able to complete all exercises as instructed by therapist today. He continues to require intermittent seated rest breaks throughout session. Pt appeared heavily fatigued today compared to usual which could be due to feeling weaker this morning prior to PT. Pt demonstrates moderate difficulty with sit to stand exercise needing multiple attempts to raise out of regular height chair. He has difficulty with anterior weight shifting to gain momentum to raise from sit to stand. Pt continues to demonstrates posteriorly LOB during balance exercises needing the wall as a safety to help correct position. He does not demonstrate a stepping strategy to regain balance indicating decreased reaction time. Pt demonstrates difficulty with head turns in both directions as he tends to posteriorly LOB intermittently throughout exercise. Pt demonstrates decreased  truncal rotation during standing contralateral reaching. Pt encouraged to continue HEP. He will continue to benefit from skilled PT services to address deficits in strength, balance, and mobility in order to return to full function at home.    Stability/Clinical Decision Making  Stable/Uncomplicated    Rehab Potential  Good    Clinical Impairments Affecting Rehab Potential  (+) support system (-) age;     PT Frequency  2x / week    PT Duration  8 weeks    PT Treatment/Interventions  ADLs/Self Care Home Management;Energy conservation;Patient/family education;Therapeutic exercise;Balance training;Neuromuscular re-education;Therapeutic activities;Functional mobility training;Stair training;DME Instruction;Gait training;Aquatic Therapy;Cryotherapy;Electrical  Stimulation;Iontophoresis 15m/ml Dexamethasone;Moist Heat;Traction;Ultrasound;Taping;Passive range of motion;Vestibular;Manual techniques    PT Next Visit Plan  strength, balance; static balance while engaged in UE task, head turning activities for balance    PT Home Exercise Plan  Semitandem balance, semitandem balance with horizontal head turns, single leg balance, heel/toe raises (([id number]    Consulted and Agree with Plan of Care  Patient       Patient will benefit from skilled therapeutic intervention in order to improve the following deficits and impairments:  Abnormal gait, Decreased activity tolerance, Decreased balance, Decreased coordination, Decreased endurance, Decreased mobility, Decreased strength, Difficulty walking, Impaired sensation, Pain, Impaired UE functional use, Postural dysfunction, Improper body mechanics, Impaired perceived functional ability, Decreased knowledge of use of DME  Visit Diagnosis: 1. Muscle weakness (generalized)   2. Unsteadiness on feet   3. Impairment of balance   4. Unsteady gait        Problem List There are no active problems to display for this patient.  This entire session was performed under direct supervision and direction of a licensed therapist/therapist assistant . I have personally read, edited and approve of the note as written.   NJuliann Pulse SPT  JPhillips GroutPT, DPT, GCS  Huprich,Jason 05/08/2019, 10:07 AM  [facility]SERV[facility] [address] [place] [address]Phone: [phone]  Fax:  [phone] Name: Eric ADERMANMRN: [id number] of Birth: 705-Nov-1933

## 2019-05-07 NOTE — Therapy (Deleted)
[facility] [address] [place], [place], [address] Phone: [phone]   Fax:  [phone]  Physical Therapy Treatment  Patient Details  Name: Eric Torres MRN: [id number] Date of Birth: 01/20/1932 Referring Provider (PT): Lindsey Goeres   Encounter Date: 05/07/2019  PT End of Session - 05/07/19 1540    Visit Number  47    Number of Visits  60    Date for PT Re-Evaluation  05/16/19    Authorization Type  Last goals: addressed via telemedicine 03/21/19    PT Start Time  0345    PT Stop Time  0430    PT Time Calculation (min)  45 min    Equipment Utilized During Treatment  Gait belt    Activity Tolerance  Patient tolerated treatment well    Behavior During Therapy  WFL for tasks assessed/performed       Past Medical History:  Diagnosis Date  . Bilateral foot pain    LIKELY NEUROPATHY  . BPH (benign prostatic hyperplasia)   . Cancer (HCC)    ,HX OF PROSTATE CANCER AND BLADDER CANCER  . Collagenous colitis   . COPD (chronic obstructive pulmonary disease) (HCC)    MILD-NO INHALERS PER PT  . Essential tremor   . Frequent falls   . Heart murmur   . History of brachytherapy   . History of TIA (transient ischemic attack) 2015   LEFT THUMB AND INDEX FINGER-SOMETIMES I DROP THINGS  . Hypercholesteremia   . Hypertension   . Hypothyroidism   . PVD (peripheral vascular disease) (HCC)   . Stroke (HCC)   . Vitamin D deficiency     Past Surgical History:  Procedure Laterality Date  . CHOLECYSTECTOMY    . CYSTOSCOPY WITH BIOPSY N/A 12/29/2015   Procedure: CYSTOSCOPY WITH BIOPSY;  Surgeon: Michael R Wolff, MD;  Location: [facility];  Service: Urology;  Laterality: N/A;  . DIAGNOSTIC LAPAROSCOPY    . ESOPHAGOGASTRODUODENOSCOPY (EGD) WITH PROPOFOL N/A 02/15/2019   Procedure: ESOPHAGOGASTRODUODENOSCOPY (EGD) WITH PROPOFOL;  Surgeon: Skulskie, Martin U, MD;  Location: [facility];  Service: Endoscopy;  Laterality: N/A;  .  EXPLORATORY LAPAROTOMY  1991   VOLVULUS AND BOWEL OBSTRUCTION  . HERNIA REPAIR    . ILIAC ARTERY STENT Bilateral   . TONSILLECTOMY    . TRANSURETHRAL RESECTION OF BLADDER TUMOR N/A 12/29/2015   Procedure: TRANSURETHRAL RESECTION OF BLADDER TUMOR (TURBT)/MITOMYCIN INSTILLATION;  Surgeon: Michael R Wolff, MD;  Location: [facility];  Service: Urology;  Laterality: N/A;  . TRANSURETHRAL RESECTION OF BLADDER TUMOR WITH GYRUS (TURBT-GYRUS)  2013  . VASECTOMY      There were no vitals filed for this visit.  Subjective Assessment - 05/07/19 1539    Subjective  Pt reports falling this morning around 4:30am. He got up to use the rest room and couldn't find the door so went back to his room and reached out to grab the side of the bed but was not where he thought he was in relation to the bed and fell face first. He has a bruise on his forehead as a result but reports no numbness and tingling, loss/blurred vision, unsteadiness, dizziness, headache, muscle weakness. He reports seeing his physician on Tuesday 04/30/19 and has a f/u scheduled for 05/27/19 and recieved two spinal injections. He reports receiving a back brace and walker about two weeks ago. He wears the back brace intermittently and uses the walker for community ambulation.    Pertinent History  Patient   reports his main issue is his balance. States he has a new doctor that refered him to this clinic instead of outpatient in [place] where he has received physical therapy prior. Has received therapy for balance in the past (approximately 1 year ago). States he has fallen twice recently when he tripped over leg lift of recliner (6 weeks ago), and fell carrying groceries while trying to open door (4 weeks ago). Reports he does have a fear of falling while walking. States he has circulation issues in his legs that has returned and has not told doctor. Completes water aerobics 2-3x a week. Patient states he also has shoulder pain and back pain, and that shoulder  hurts is when he raises arm in abduction against resistance and when rolling over in bed. Used to enjoy playing golf. Would like to be able to walk around the block with his wife.     Limitations  Standing;Walking;House hold activities    How long can you stand comfortably?  5 minutes    How long can you walk comfortably?  5 minutes     Currently in Pain?  Yes    Pain Score  4     Pain Location  Back    Pain Orientation  Lower    Pain Descriptors / Indicators  Aching    Pain Type  Chronic pain    Pain Onset  More than a month ago    Pain Frequency  Intermittent                                 PT Short Term Goals - 03/21/19 1726      PT SHORT TERM GOAL #1   Title  Patient will be independent with completion of HEP to improve ability to complete functional tasks.    Baseline  HEP compliant; 11/27/18: Pt only performing 3d/wk    Time  4    Period  Weeks    Status  On-going    Target Date  04/18/19      PT SHORT TERM GOAL #2   Title  Patient will report no falls in the next two weeks to demonstrate improved safety and fall risk. 11/27/18: Pt fell Saturday morning while he was putting something in the bottom shelf of refrigerator    Baseline  07/09/18: fell 2x in 6 weeks 1/7: no falls; 11/27/18: Pt fell 3 days prior to treatment; 12/20/18: No falls since 11/24/18    Time  2    Period  Weeks    Status  Achieved    Target Date  12/25/18        PT Long Term Goals - 03/21/19 1726      PT LONG TERM GOAL #1   Title  Patient will improve 5x sit to stand to <10seconds without UE support to demonstrate improvements in functional strength.     Baseline  07/09/18: 13sec hands on knees 1/7: 13 seconds occasional hand on knee; 11/27/18: 12.3s no UE support, occasional posterior leaning; 12/20/18: 11.9s with occasional posterior leaning; 01/29/19: Unable to assess over telemedicine; 03/21/19: Unable to assess over telemedicine    Time  8    Period  Weeks    Status  Partially  Met    Target Date  05/16/19      PT LONG TERM GOAL #2   Title  Patient will increase BERG balance score to >52/56 to decrease fall risk and improve   balance.     Baseline  07/09/18: 47/56 1/7: 52/56; 11/27/18: 52/56; 12/20/18: 53/56;    Time  8    Period  Weeks    Status  Achieved      PT LONG TERM GOAL #3   Title  Patient will be able to walk with wife around neighborhood (>15minutes) to demonstrate improved balance and community ambulation.     Baseline  07/09/18: 5minutes 1/7: wife just returned; 11/27/18: has not attempted; 12/20/18: has not attempted yet; 01/30/19: Has not attempted yet; 03/21/19: Has not attempted yet    Time  8    Period  Weeks    Status  On-going    Target Date  05/16/19      PT LONG TERM GOAL #4   Title  Patient will improve 10mwt to >1.0m/s limiting fall risk and improve community ambulation.     Baseline  07/09/18: 0.833m/s; 1/7: 1.25 m/s; 11/27/18: 7.9s=1.27 m/s; 12/20/18: Fastest: 7.5s = 1.33 m/s;    Time  6    Period  Weeks    Status  Achieved      PT LONG TERM GOAL #5   Title  Patient will increase DGI to >18/24 to indicate increased stability with dynamic mobility and decrease fall risk.     Baseline  1/7: 14/24; 11/27/18: 17/24; 12/20/18: 20/24;    Time  6    Period  Weeks    Status  Achieved      PT LONG TERM GOAL #6   Title  Pt able to descend 8 steps with single UE assist with no LOB or heel catching on step in order to improve community access and safety on steps.    Baseline  1/7: BUE support and heel catching; 11/27/18: Heel still catching; 12/20/18: Still requires UE assist; 01/29/19: Unable to assess over telemedicine; 03/21/19: Unable to assess over telemedicine    Time  8    Period  Weeks    Status  On-going    Target Date  05/16/19      PT LONG TERM GOAL #7   Title   Pt will improve DGI by at least 3 points in order to demonstrate clinically significant improvement in balance and decreased risk for falls     Baseline  12/20/18: 20/24; 01/29/19:  Unable to assess over telemedicine; 03/21/19: Unable to assess over telemedicine    Time  8    Period  Weeks    Status  On-going    Target Date  05/16/19      PT LONG TERM GOAL #8   Title  Pt will increase 6MWT by at least 50m (164ft) in order to demonstrate clinically significant improvement in cardiopulmonary endurance and community ambulation     Baseline  12/20/18: 1070'; 01/29/19: Unable to assess over telemedicine; 12/20/18: 20/24; 01/29/19: Unable to assess over telemedicine; 03/21/19: Unable to assess over telemedicine    Time  8    Period  Weeks    Status  On-going    Target Date  05/16/19            Plan - 05/07/19 1540    Stability/Clinical Decision Making  Stable/Uncomplicated    Rehab Potential  Good    Clinical Impairments Affecting Rehab Potential  (+) support system (-) age;     PT Frequency  2x / week    PT Duration  8 weeks    PT Treatment/Interventions  ADLs/Self Care Home Management;Energy conservation;Patient/family education;Therapeutic exercise;Balance training;Neuromuscular re-education;Therapeutic activities;Functional mobility training;Stair training;DME   Instruction;Gait training;Aquatic Therapy;Cryotherapy;Electrical Stimulation;Iontophoresis 46m/ml Dexamethasone;Moist Heat;Traction;Ultrasound;Taping;Passive range of motion;Vestibular;Manual techniques    PT Next Visit Plan  strength, balance; static balance while engaged in UE task, head turning activities for balance    PT Home Exercise Plan  Semitandem balance, semitandem balance with horizontal head turns, single leg balance, heel/toe raises (([id number]    Consulted and Agree with Plan of Care  Patient       Patient will benefit from skilled therapeutic intervention in order to improve the following deficits and impairments:  Abnormal gait, Decreased activity tolerance, Decreased balance, Decreased coordination, Decreased endurance, Decreased mobility, Decreased strength, Difficulty walking, Impaired  sensation, Pain, Impaired UE functional use, Postural dysfunction, Improper body mechanics, Impaired perceived functional ability, Decreased knowledge of use of DME  Visit Diagnosis: 1. Muscle weakness (generalized)   2. Unsteadiness on feet   3. Impairment of balance   4. Unsteady gait        Problem List There are no active problems to display for this patient.   Eric Torres 05/07/2019, 3:56 PM  [place] [address] [address]Phone: [phone]  Fax:  [phone] Name: Eric RASTETTERMRN: [id number] of Birth: 712/22/1933

## 2019-05-16 ENCOUNTER — Other Ambulatory Visit: Payer: Self-pay

## 2019-05-16 ENCOUNTER — Ambulatory Visit: Payer: Medicare Other | Attending: Family Medicine

## 2019-05-16 DIAGNOSIS — R2681 Unsteadiness on feet: Secondary | ICD-10-CM | POA: Diagnosis present

## 2019-05-16 DIAGNOSIS — M6281 Muscle weakness (generalized): Secondary | ICD-10-CM | POA: Insufficient documentation

## 2019-05-16 DIAGNOSIS — R2689 Other abnormalities of gait and mobility: Secondary | ICD-10-CM | POA: Insufficient documentation

## 2019-05-16 NOTE — Therapy (Signed)
[place] MAIN [facility] SERVICES [address] [place], [place], [address] Phone: [phone]   Fax:  [phone]  Physical Therapy Treatment/Recertification  Patient Details  Name: Eric Torres MRN: [id number] Date of Birth: 09/11/1932 Referring Provider (PT): Mcneil Sober   Encounter Date: 05/16/2019  PT End of Session - 05/16/19 1705    Visit Number  48    Number of Visits  72    Date for PT Re-Evaluation  08/08/19    Authorization Type  Last goals: addressed via telemedicine 05/16/19    PT Start Time  8937    PT Stop Time  1730    PT Time Calculation (min)  40 min    Equipment Utilized During Treatment  Gait belt    Activity Tolerance  Patient tolerated treatment well    Behavior During Therapy  WFL for tasks assessed/performed       Past Medical History:  Diagnosis Date  . Bilateral foot pain    LIKELY NEUROPATHY  . BPH (benign prostatic hyperplasia)   . Cancer (HCC)    ,HX OF PROSTATE CANCER AND BLADDER CANCER  . Collagenous colitis   . COPD (chronic obstructive pulmonary disease) (HCC)    MILD-NO INHALERS PER PT  . Essential tremor   . Frequent falls   . Heart murmur   . History of brachytherapy   . History of TIA (transient ischemic attack) 2015   LEFT THUMB AND INDEX FINGER-SOMETIMES I DROP THINGS  . Hypercholesteremia   . Hypertension   . Hypothyroidism   . PVD (peripheral vascular disease) ([place])   . Stroke ([place])   . Vitamin D deficiency     Past Surgical History:  Procedure Laterality Date  . CHOLECYSTECTOMY    . CYSTOSCOPY WITH BIOPSY N/A 12/29/2015   Procedure: CYSTOSCOPY WITH BIOPSY;  Surgeon: Royston Cowper, MD;  Location: [facility] ORS;  Service: Urology;  Laterality: N/A;  . DIAGNOSTIC LAPAROSCOPY    . ESOPHAGOGASTRODUODENOSCOPY (EGD) WITH PROPOFOL N/A 02/15/2019   Procedure: ESOPHAGOGASTRODUODENOSCOPY (EGD) WITH PROPOFOL;  Surgeon: Lollie Sails, MD;  Location: [facility] ENDOSCOPY;  Service: Endoscopy;   Laterality: N/A;  . EXPLORATORY LAPAROTOMY  1991   VOLVULUS AND BOWEL OBSTRUCTION  . HERNIA REPAIR    . ILIAC ARTERY STENT Bilateral   . TONSILLECTOMY    . TRANSURETHRAL RESECTION OF BLADDER TUMOR N/A 12/29/2015   Procedure: TRANSURETHRAL RESECTION OF BLADDER TUMOR (TURBT)/MITOMYCIN INSTILLATION;  Surgeon: Royston Cowper, MD;  Location: [facility] ORS;  Service: Urology;  Laterality: N/A;  . TRANSURETHRAL RESECTION OF BLADDER TUMOR WITH GYRUS (TURBT-GYRUS)  2013  . VASECTOMY      There were no vitals filed for this visit.  Subjective Assessment - 05/16/19 1650    Subjective  Pt. reports doing well today. Yesterday was a "bad day" for him. He was standing in front of his dresser to take medication that he typically takes daily and he felt off balanced and fell towards his left into his bed. For the rest of the day his legs felt weak and he was fatigued. Today, he reports feeling better and not as weak. He reports his back is feeling good today with no pain. No questions or concerns at this time.    Pertinent History  Patient reports his main issue is his balance. States he has a new doctor that refered him to this clinic instead of outpatient in [facility] where he has received physical therapy prior. Has received therapy for balance in the past (  approximately 1 year ago). States he has fallen twice recently when he tripped over leg lift of recliner (6 weeks ago), and fell carrying groceries while trying to open door (4 weeks ago). Reports he does have a fear of falling while walking. States he has circulation issues in his legs that has returned and has not told doctor. Completes water aerobics 2-3x a week. Patient states he also has shoulder pain and back pain, and that shoulder hurts is when he raises arm in abduction against resistance and when rolling over in bed. Used to enjoy playing golf. Would like to be able to walk around the block with his wife.     Limitations  Standing;Walking;House hold  activities    How long can you stand comfortably?  5 minutes    How long can you walk comfortably?  5 minutes     Currently in Pain?  No/denies    Pain Onset  --         [facility] PT Assessment - 05/17/19 0001      Standardized Balance Assessment   Five times sit to stand comments   10.86s   assessed via telehealth      TREATMENT   Physical Therapy Telehealth Visit:   I connected with [name] today at 1650 by Webex video conference and verified that I am speaking with the correct person using two identifiers.  I discussed the limitations, risks, security and privacy concerns of performing an evaluation and management service by Webex. I also discussed with the patient that there may be a patient responsible charge related to this service. The patient expressed understanding and agreed to proceed. Identified to the patient that therapist is a licensed physical therapist in the state of [place].   Other persons participating in the visit and their role in the encounter: Daughter present but not assisting with encounter. 1 student PT present with primary therapist and participating in the encounter. Patient's location: Home Patient's address: [address] [name] [address] (confirmed in case of emergency) Patient's phone #: [phone] (confirmed in case of technical difficulties), [phone] Eric Torres, daughter); Provider's location: [place] [facility] Patient agreed to evaluation/treatment by telemedicine     Therapeutic Exercise 5TSTS: 10.68s Updated goals with patient; Seated clams with green tband around knees x 1 minute; Seated marches with green tband around knees x 1 minute; Seated isometric adductor squeeze 3s hold x 1 minute; Sit to stand without UE support from regular height as many as possible x1 minute; with diminished height of STS due to fatigue and weakness  Standing mini squats x30s;  Forward lunges alternating lead LE 2x1 minute;  mod-max bilateral UE support required Standing bilateral reaching contralateral side x1 minute; decreased truncal rotation noted      Neuromuscular Re-education  All standing balance exercises performed with back to empty corner of room for safety; pt utilizes UE against corner of room for stability Heel/toe raises 3s hold x 1 minute; with unilateral UE support Feet together with EC x 1 minute; posterior swaying  Semitandem EC alternating forward LE x 1 minute on each side; pt demonstrates mod swaying  Semitandem EO horizontal and then vertical head turns alternating forward LE x1 minute each on each side;       Pt educated throughout session about proper posture and technique with exercises. Improved exercise technique, movement at target joints, use of target muscles after min to mod verbal cues.  Pt demonstrates good motivation during session. Pt had a "bad day" yesterday with a LOB to the left into his bed. He felt weak and fatigued the rest of the day but today was better although he didn't feel as strong as usual. Pt was able to perform all exercises as instructed by therapist today. Pt continues to demonstrate difficulty with sit to stand exercise often having a posterior LOB due to dec anterior pelvic weight shift. He demonstrates weakness with hip extensor moment with squat and sit to stands. He requires intermittent rest breaks for LE fatigue. Pt continues to demonstrates posteriorly LOB during balance exercises needing the wall as a safety to help correct position. He does not demonstrate a stepping strategy to regain balance indicating decreased reaction time.  Pt demonstrates decreased truncal rotation during standing contralateral reaching. Five Time Sit to Stand is slightly better today but pt still has not met his goal. He also remains limited in his ambulation endurance. Pt encouraged to continue HEP. He will continue to benefit from skilled PT services to address deficits in  strength, balance, and mobility in order to return to full function at home.    PT Education - 05/16/19 1704    Education provided  Yes    Education Details  exercise instructions throughout session    Person(s) Educated  Patient    Methods  Explanation    Comprehension  Verbalized understanding;Returned demonstration       PT Short Term Goals - 05/16/19 1717      PT SHORT TERM GOAL #1   Title  Patient will be independent with completion of HEP to improve ability to complete functional tasks.    Baseline  HEP compliant; 11/27/18: Pt only performing 3d/wk; 05/16/19: pt reports HEP 1x/week    Time  4    Period  Weeks    Status  On-going    Target Date  06/27/19      PT SHORT TERM GOAL #2   Title  Patient will report no falls in the next two weeks to demonstrate improved safety and fall risk. 11/27/18: Pt fell Saturday morning while he was putting something in the bottom shelf of refrigerator    Baseline  07/09/18: fell 2x in 6 weeks 1/7: no falls; 11/27/18: Pt fell 3 days prior to treatment; 12/20/18: No falls since 11/24/18; 05/16/19: LOB to the L reported from yesterday 05/15/19    Time  2    Period  Weeks    Status  On-going    Target Date  06/27/19        PT Long Term Goals - 05/16/19 1719      PT LONG TERM GOAL #1   Title  Patient will improve 5x sit to stand to <10seconds without UE support to demonstrate improvements in functional strength.     Baseline  07/09/18: 13sec hands on knees 1/7: 13 seconds occasional hand on knee; 11/27/18: 12.3s no UE support, occasional posterior leaning; 12/20/18: 11.9s with occasional posterior leaning; 01/29/19: Unable to assess over telemedicine; 03/21/19: Unable to assess over telemedicine; 05/16/19: 10.68s assessed over telehealth    Time  8    Period  Weeks    Status  Partially Met    Target Date  08/08/19      PT LONG TERM GOAL #2   Title  Patient will increase BERG balance score to >52/56 to decrease fall risk and improve balance.     Baseline   07/09/18: 47/56 1/7: 52/56; 11/27/18: 52/56; 12/20/18:  53/56;    Time  8    Period  Weeks    Status  Achieved      PT LONG TERM GOAL #3   Title  Patient will be able to walk with wife around neighborhood (>6mnutes) to demonstrate improved balance and community ambulation.     Baseline  07/09/18: 556mutes 1/7: wife just returned; 11/27/18: has not attempted; 12/20/18: has not attempted yet; 01/30/19: Has not attempted yet; 03/21/19: Has not attempted yet; 05/16/19: Has not attempted fully, he has gone out a couple of times and is "slow so it feels like 15 mins" but not an accurate history    Time  8    Period  Weeks    Status  On-going    Target Date  08/08/19      PT LONG TERM GOAL #4   Title  Patient will improve 1013mto >1.41m/71mimiting fall risk and improve community ambulation.     Baseline  07/09/18: 0.833m/43m/7: 1.25 m/s; 11/27/18: 7.9s=1.27 m/s; 12/20/18: Fastest: 7.5s = 1.33 m/s;    Time  6    Period  Weeks    Status  Achieved      PT LONG TERM GOAL #5   Title  Patient will increase DGI to >18/24 to indicate increased stability with dynamic mobility and decrease fall risk.     Baseline  1/7: 14/24; 11/27/18: 17/24; 12/20/18: 20/24;    Time  6    Period  Weeks    Status  Achieved      PT LONG TERM GOAL #6   Title  Pt able to descend 8 steps with single UE assist with no LOB or heel catching on step in order to improve community access and safety on steps.    Baseline  1/7: BUE support and heel catching; 11/27/18: Heel still catching; 12/20/18: Still requires UE assist; 01/29/19: Unable to assess over telemedicine; 03/21/19: Unable to assess over telemedicine; 05/16/19: unable to assess via telemedicine, reports needing 1 hand rail but has not descending multiple steps    Time  8    Period  Weeks    Status  On-going    Target Date  08/08/19      PT LONG TERM GOAL #7   Title   Pt will improve DGI by at least 3 points in order to demonstrate clinically significant improvement in balance and  decreased risk for falls     Baseline  12/20/18: 20/24; 01/29/19: Unable to assess over telemedicine; 03/21/19: Unable to assess over telemedicine    Time  8    Period  Weeks    Status  On-going    Target Date  08/08/19      PT LONG TERM GOAL #8   Title  Pt will increase 6MWT by at least 541m (23mt) 35frder to demonstrate clinically significant improvement in cardiopulmonary endurance and community ambulation     Baseline  12/20/18: 1070'; 01/29/19: Unable to assess over telemedicine; 12/20/18: 20/24; 01/29/19: Unable to assess over telemedicine; 03/21/19: Unable to assess over telemedicine    Time  8    Period  Weeks    Status  On-going    Target Date  08/08/19            Plan - 05/16/19 1706    Clinical Impression Statement  Pt demonstrates good motivation during session. Pt had a "bad day" yesterday with a LOB to the left into his bed. He felt weak and fatigued the rest of  the day but today was better although he didn't feel as strong as usual. Pt was able to perform all exercises as instructed by therapist today. Pt continues to demonstrate difficulty with sit to stand exercise often having a posterior LOB due to dec anterior pelvic weight shift. He demonstrates weakness with hip extensor moment with squat and sit to stands. He requires intermittent rest breaks for LE fatigue. Pt continues to demonstrates posteriorly LOB during balance exercises needing the wall as a safety to help correct position. He does not demonstrate a stepping strategy to regain balance indicating decreased reaction time.  Pt demonstrates decreased truncal rotation during standing contralateral reaching. Five Time Sit to Stand is slightly better today but pt still has not met his goal. He also remains limited in his ambulation endurance. Pt encouraged to continue HEP. He will continue to benefit from skilled PT services to address deficits in strength, balance, and mobility in order to return to full function at home.     Stability/Clinical Decision Making  Stable/Uncomplicated    Rehab Potential  Good    Clinical Impairments Affecting Rehab Potential  (+) support system (-) age;     PT Frequency  2x / week    PT Duration  8 weeks    PT Treatment/Interventions  ADLs/Self Care Home Management;Energy conservation;Patient/family education;Therapeutic exercise;Balance training;Neuromuscular re-education;Therapeutic activities;Functional mobility training;Stair training;DME Instruction;Gait training;Aquatic Therapy;Cryotherapy;Electrical Stimulation;Iontophoresis 68m/ml Dexamethasone;Moist Heat;Traction;Ultrasound;Taping;Passive range of motion;Vestibular;Manual techniques    PT Next Visit Plan  strength, balance; static balance while engaged in UE task, head turning activities for balance    PT Home Exercise Plan  Semitandem balance, semitandem balance with horizontal head turns, single leg balance, heel/toe raises (([id number]    Consulted and Agree with Plan of Care  Patient       Patient will benefit from skilled therapeutic intervention in order to improve the following deficits and impairments:  Abnormal gait, Decreased activity tolerance, Decreased balance, Decreased coordination, Decreased endurance, Decreased mobility, Decreased strength, Difficulty walking, Impaired sensation, Pain, Impaired UE functional use, Postural dysfunction, Improper body mechanics, Impaired perceived functional ability, Decreased knowledge of use of DME  Visit Diagnosis: 1. Muscle weakness (generalized)   2. Unsteadiness on feet   3. Impairment of balance   4. Unsteady gait        Problem List There are no active problems to display for this patient.  This entire session was performed under direct supervision and direction of a licensed therapist/therapist assistant . I have personally read, edited and approve of the note as written.   NElmyra RicksTurcotte SPT  JPhillips GroutPT, DPT, GCS  Huprich,Jason 05/17/2019, 11:40  AM  [place] [facility]SERV[facility] [address] [address]Phone: [phone]  Fax:  [phone] Name: MNAVIN DOGANMRN: [id number] of Birth: 711-09-1932

## 2019-05-23 ENCOUNTER — Ambulatory Visit: Payer: Medicare Other

## 2019-05-23 ENCOUNTER — Other Ambulatory Visit: Payer: Self-pay

## 2019-05-23 DIAGNOSIS — M6281 Muscle weakness (generalized): Secondary | ICD-10-CM | POA: Diagnosis not present

## 2019-05-23 DIAGNOSIS — R2681 Unsteadiness on feet: Secondary | ICD-10-CM

## 2019-05-23 DIAGNOSIS — R2689 Other abnormalities of gait and mobility: Secondary | ICD-10-CM

## 2019-05-23 NOTE — Therapy (Signed)
[place] [facility] SERVICES [address] [place], [place], [address] Phone: [phone]   Fax:  [phone]  Physical Therapy Treatment  Patient Details  Name: Eric Torres MRN: [id number] Date of Birth: 15-May-1932 Referring Provider (PT): Mcneil Sober   Encounter Date: 05/23/2019  PT End of Session - 05/23/19 1659    Visit Number  49    Number of Visits  27    Date for PT Re-Evaluation  08/08/19    Authorization Type  Last goals: addressed via telemedicine 05/16/19    PT Start Time  2683    PT Stop Time  1730    PT Time Calculation (min)  40 min    Equipment Utilized During Treatment  Gait belt    Activity Tolerance  Patient tolerated treatment well    Behavior During Therapy  WFL for tasks assessed/performed       Past Medical History:  Diagnosis Date  . Bilateral foot pain    LIKELY NEUROPATHY  . BPH (benign prostatic hyperplasia)   . Cancer (HCC)    ,HX OF PROSTATE CANCER AND BLADDER CANCER  . Collagenous colitis   . COPD (chronic obstructive pulmonary disease) (HCC)    MILD-NO INHALERS PER PT  . Essential tremor   . Frequent falls   . Heart murmur   . History of brachytherapy   . History of TIA (transient ischemic attack) 2015   LEFT THUMB AND INDEX FINGER-SOMETIMES I DROP THINGS  . Hypercholesteremia   . Hypertension   . Hypothyroidism   . PVD (peripheral vascular disease) ([place])   . Stroke ([place])   . Vitamin D deficiency     Past Surgical History:  Procedure Laterality Date  . CHOLECYSTECTOMY    . CYSTOSCOPY WITH BIOPSY N/A 12/29/2015   Procedure: CYSTOSCOPY WITH BIOPSY;  Surgeon: Royston Cowper, MD;  Location: [facility] ORS;  Service: Urology;  Laterality: N/A;  . DIAGNOSTIC LAPAROSCOPY    . ESOPHAGOGASTRODUODENOSCOPY (EGD) WITH PROPOFOL N/A 02/15/2019   Procedure: ESOPHAGOGASTRODUODENOSCOPY (EGD) WITH PROPOFOL;  Surgeon: Lollie Sails, MD;  Location: [facility] ENDOSCOPY;  Service: Endoscopy;  Laterality: N/A;  .  EXPLORATORY LAPAROTOMY  1991   VOLVULUS AND BOWEL OBSTRUCTION  . HERNIA REPAIR    . ILIAC ARTERY STENT Bilateral   . TONSILLECTOMY    . TRANSURETHRAL RESECTION OF BLADDER TUMOR N/A 12/29/2015   Procedure: TRANSURETHRAL RESECTION OF BLADDER TUMOR (TURBT)/MITOMYCIN INSTILLATION;  Surgeon: Royston Cowper, MD;  Location: [facility] ORS;  Service: Urology;  Laterality: N/A;  . TRANSURETHRAL RESECTION OF BLADDER TUMOR WITH GYRUS (TURBT-GYRUS)  2013  . VASECTOMY      There were no vitals filed for this visit.  Subjective Assessment - 05/23/19 1652    Subjective  Pt reports that today has been an 'okay' day but this morning was bad. He woke up with his back bothering him but has improved throughout the day. He had an appt with Dr.Anderson on 05/21/19 and reports a recent change to blood pressure medication but has not started taking it yet due to it not being ready at the pharmacy. No questions or concerns at this time.    Pertinent History  Patient reports his main issue is his balance. States he has a new doctor that refered him to this clinic instead of outpatient in [facility] where he has received physical therapy prior. Has received therapy for balance in the past (approximately 1 year ago). States he has fallen twice recently when he tripped over  leg lift of recliner (6 weeks ago), and fell carrying groceries while trying to open door (4 weeks ago). Reports he does have a fear of falling while walking. States he has circulation issues in his legs that has returned and has not told doctor. Completes water aerobics 2-3x a week. Patient states he also has shoulder pain and back pain, and that shoulder hurts is when he raises arm in abduction against resistance and when rolling over in bed. Used to enjoy playing golf. Would like to be able to walk around the block with his wife.     Limitations  Standing;Walking;House hold activities    How long can you stand comfortably?  5 minutes    How long can you walk  comfortably?  5 minutes     Currently in Pain?  Yes    Pain Score  4     Pain Location  Back    Pain Orientation  Lower          TREATMENT  Physical Therapy Telehealth Visit:  I connected with Eathen Budreau today at 1650 by Webex video conference and verified that I am speaking with the correct person using two identifiers. I discussed the limitations, risks, security and privacy concerns of performing an evaluation and management service by Webex. I also discussed with the patient that there may be a patient responsible charge related to this service. The patient expressed understanding and agreed to proceed. Identified to the patient that therapist is a licensed physical therapist in the state of [place].  Other persons participating in the visit and their role in the encounter: Daughter present but not assisting with encounter. 1 student PT present with primary therapist and participating in the encounter. Patient's location: Home Patient's address: [address] [name] [address] (confirmed in case of emergency) Patient's phone #: [phone] (confirmed in case of technical difficulties), [phone] Eric Torres, daughter); Provider's location: [facility] Patient agreed to evaluation/treatment by telemedicine   Therapeutic Exercise  Seated clams with green tband around knees x 1 minute; Seated marches with green tband around knees x 1 minute; Seated isometric adductor squeeze 3s hold x 1 minute; Sit to stand without UE support from regular height as many as possible x1 minute; with diminished height of STS due to fatigue and weakness Standing mini squats; attempted and terminated after 2 repetitions  Forward lunges alternating lead LE 2x1 minute; mod-max bilateral UE support required Standing bilateral reaching contralateral side x1 minute; decreased truncal rotation noted Seated LAQ 3s hold x10 repetitions each LE    Neuromuscular  Re-education All standing balance exercises performed with back to empty corner of room for safety; pt utilizes UE against corner of room for stability Heel/toe raises 3s hold 2x1 minute; with unilateral UE support Standing marches slowly to inc SLS x1 minute  Feet together with EC x 1 minute; posterior swaying Semitandem EC alternating forward LE x 1 minute on each side; pt demonstrates mod swaying Semitandem EO horizontal and then vertical head turns alternating forward LE x1 minute each on each side;  Single leg balance alt LE x1 minute; multiple toe touch downs, mod UE support against wall     Pt educated throughout session about proper posture and technique with exercises. Improved exercise technique, movement at target joints, use of target muscles after min to mod verbal cues.   Pt demonstrates good motivation during session. Pt presented to PT with equipment (towel and theraband) to start  the session. His LBP was reported to be a 4/10 prior to the session beginning which had no c/o inc pain at the end of session. Pt was able to perform all exercises as instructed by therapist today. He demonstrates dec hip extensor moment likely resulting from fatigue and weakness and requires verbal cueing for anterior pelvic weight shift during sit to stands. He attempted mini squats however LOB posteriorly into the wall behind him and appeared significantly more unsteady compared to previous sessions; therapist decided to terminate exercise.  Pt continues to demonstrate posterior LOB during balance activities especially with head turns and semitandem stance. Pt had a LOB laterally to the left x2 after semitandem balance and demonstrates good cross over strategy to recover, however the frequency of LOB and unsteadiness has progressively increased. Pt demonstrates decreased truncal rotation during standing contralateral reaching. Pt encouraged to continue HEP. He will continue to benefit from skilled  PT services to address deficits in strength, balance, and mobility in order to return to full function at home.        PT Short Term Goals - 05/16/19 1717      PT SHORT TERM GOAL #1   Title  Patient will be independent with completion of HEP to improve ability to complete functional tasks.    Baseline  HEP compliant; 11/27/18: Pt only performing 3d/wk; 05/16/19: pt reports HEP 1x/week    Time  4    Period  Weeks    Status  On-going    Target Date  06/27/19      PT SHORT TERM GOAL #2   Title  Patient will report no falls in the next two weeks to demonstrate improved safety and fall risk. 11/27/18: Pt fell Saturday morning while he was putting something in the bottom shelf of refrigerator    Baseline  07/09/18: fell 2x in 6 weeks 1/7: no falls; 11/27/18: Pt fell 3 days prior to treatment; 12/20/18: No falls since 11/24/18; 05/16/19: LOB to the L reported from yesterday 05/15/19    Time  2    Period  Weeks    Status  On-going    Target Date  06/27/19        PT Long Term Goals - 05/16/19 1719      PT LONG TERM GOAL #1   Title  Patient will improve 5x sit to stand to <10seconds without UE support to demonstrate improvements in functional strength.     Baseline  07/09/18: 13sec hands on knees 1/7: 13 seconds occasional hand on knee; 11/27/18: 12.3s no UE support, occasional posterior leaning; 12/20/18: 11.9s with occasional posterior leaning; 01/29/19: Unable to assess over telemedicine; 03/21/19: Unable to assess over telemedicine; 05/16/19: 10.68s assessed over telehealth    Time  8    Period  Weeks    Status  Partially Met    Target Date  08/08/19      PT LONG TERM GOAL #2   Title  Patient will increase BERG balance score to >52/56 to decrease fall risk and improve balance.     Baseline  07/09/18: 47/56 1/7: 52/56; 11/27/18: 52/56; 12/20/18: 53/56;    Time  8    Period  Weeks    Status  Achieved      PT LONG TERM GOAL #3   Title  Patient will be able to walk with wife around neighborhood  (>78mnutes) to demonstrate improved balance and community ambulation.     Baseline  07/09/18: 562mutes 1/7: wife just returned; 11/27/18: has not  attempted; 12/20/18: has not attempted yet; 01/30/19: Has not attempted yet; 03/21/19: Has not attempted yet; 05/16/19: Has not attempted fully, he has gone out a couple of times and is "slow so it feels like 15 mins" but not an accurate history    Time  8    Period  Weeks    Status  On-going    Target Date  08/08/19      PT LONG TERM GOAL #4   Title  Patient will improve 23mt to >1.028m limiting fall risk and improve community ambulation.     Baseline  07/09/18: 0.83322m 1/7: 1.25 m/s; 11/27/18: 7.9s=1.27 m/s; 12/20/18: Fastest: 7.5s = 1.33 m/s;    Time  6    Period  Weeks    Status  Achieved      PT LONG TERM GOAL #5   Title  Patient will increase DGI to >18/24 to indicate increased stability with dynamic mobility and decrease fall risk.     Baseline  1/7: 14/24; 11/27/18: 17/24; 12/20/18: 20/24;    Time  6    Period  Weeks    Status  Achieved      PT LONG TERM GOAL #6   Title  Pt able to descend 8 steps with single UE assist with no LOB or heel catching on step in order to improve community access and safety on steps.    Baseline  1/7: BUE support and heel catching; 11/27/18: Heel still catching; 12/20/18: Still requires UE assist; 01/29/19: Unable to assess over telemedicine; 03/21/19: Unable to assess over telemedicine; 05/16/19: unable to assess via telemedicine, reports needing 1 hand rail but has not descending multiple steps    Time  8    Period  Weeks    Status  On-going    Target Date  08/08/19      PT LONG TERM GOAL #7   Title   Pt will improve DGI by at least 3 points in order to demonstrate clinically significant improvement in balance and decreased risk for falls     Baseline  12/20/18: 20/24; 01/29/19: Unable to assess over telemedicine; 03/21/19: Unable to assess over telemedicine    Time  8    Period  Weeks    Status  On-going    Target  Date  08/08/19      PT LONG TERM GOAL #8   Title  Pt will increase 6MWT by at least [id number] order to demonstrate clinically significant improvement in cardiopulmonary endurance and community ambulation     Baseline  12/20/18: 1070'; 01/29/19: Unable to assess over telemedicine; 12/20/18: 20/24; 01/29/19: Unable to assess over telemedicine; 03/21/19: Unable to assess over telemedicine    Time  8    Period  Weeks    Status  On-going    Target Date  08/08/19            Plan - 05/23/19 1700    Clinical Impression Statement  Pt demonstrates good motivation during session. Pt presented to PT with equipment (towel and theraband) to start the session. His LBP was reported to be a 4/10 prior to the session beginning which had no c/o inc pain at the end of session. Pt was able to perform all exercises as instructed by therapist today. He demonstrates dec hip extensor moment likely resulting from fatigue and weakness and requires verbal cueing for anterior pelvic weight shift during sit to stands. He attempted mini squats however LOB posteriorly into the wall behind him and appeared significantly  more unsteady compared to previous sessions; therapist decided to terminate exercise.  Pt continues to demonstrate posterior LOB during balance activities especially with head turns and semitandem stance. Pt had a LOB laterally to the left x2 after semitandem balance and demonstrates good cross over strategy to recover, however the frequency of LOB and unsteadiness has progressively increased. Pt demonstrates decreased truncal rotation during standing contralateral reaching. Pt encouraged to continue HEP. He will continue to benefit from skilled PT services to address deficits in strength, balance, and mobility in order to return to full function at home.    Stability/Clinical Decision Making  Stable/Uncomplicated    Rehab Potential  Good    Clinical Impairments Affecting Rehab Potential  (+) support system (-)  age;     PT Frequency  2x / week    PT Duration  8 weeks    PT Treatment/Interventions  ADLs/Self Care Home Management;Energy conservation;Patient/family education;Therapeutic exercise;Balance training;Neuromuscular re-education;Therapeutic activities;Functional mobility training;Stair training;DME Instruction;Gait training;Aquatic Therapy;Cryotherapy;Electrical Stimulation;Iontophoresis 31m/ml Dexamethasone;Moist Heat;Traction;Ultrasound;Taping;Passive range of motion;Vestibular;Manual techniques    PT Next Visit Plan  strength, balance; static balance while engaged in UE task, head turning activities for balance    PT Home Exercise Plan  Semitandem balance, semitandem balance with horizontal head turns, single leg balance, heel/toe raises (([id number]    Consulted and Agree with Plan of Care  Patient       Patient will benefit from skilled therapeutic intervention in order to improve the following deficits and impairments:  Abnormal gait, Decreased activity tolerance, Decreased balance, Decreased coordination, Decreased endurance, Decreased mobility, Decreased strength, Difficulty walking, Impaired sensation, Pain, Impaired UE functional use, Postural dysfunction, Improper body mechanics, Impaired perceived functional ability, Decreased knowledge of use of DME  Visit Diagnosis: 1. Unsteadiness on feet   2. Muscle weakness (generalized)   3. Impairment of balance        Problem List There are no active problems to display for this patient.  This entire session was performed under direct supervision and direction of a licensed therapist/therapist assistant . I have personally read, edited and approve of the note as written.   NElmyra RicksTurcotte SPT JPhillips GroutPT, DPT, GCS  Huprich,Jason 05/24/2019, 12:07 PM  [facility] [facility] [address] [place] [address]Phone: [phone]  Fax:  [phone] Name: MJAREB RADONCICMRN: [id number] of Birth: [birth year]

## 2019-05-30 ENCOUNTER — Other Ambulatory Visit: Payer: Self-pay

## 2019-05-30 ENCOUNTER — Ambulatory Visit: Payer: Medicare Other

## 2019-05-30 ENCOUNTER — Other Ambulatory Visit: Payer: Self-pay | Admitting: Urology

## 2019-05-30 DIAGNOSIS — M6281 Muscle weakness (generalized): Secondary | ICD-10-CM | POA: Diagnosis not present

## 2019-05-30 DIAGNOSIS — R310 Gross hematuria: Secondary | ICD-10-CM

## 2019-05-30 DIAGNOSIS — R2689 Other abnormalities of gait and mobility: Secondary | ICD-10-CM

## 2019-05-30 DIAGNOSIS — R2681 Unsteadiness on feet: Secondary | ICD-10-CM

## 2019-05-30 NOTE — Therapy (Signed)
[place] [facility] SERVICES [address] [place], [place], [address] Phone: [phone]   Fax:  [phone]  Physical Therapy Progress Note   Dates of reporting period  03/21/19   to   05/30/19   Patient Details  Name: Eric Torres MRN: [id number] Date of Birth: September 11, 1932 Referring Provider (PT): Eric Torres   Encounter Date: 05/30/2019  PT End of Session - 05/30/19 1702    Visit Number  50    Number of Visits  72    Date for PT Re-Evaluation  08/08/19    Authorization Type  Last goals: addressed via telemedicine 05/16/19    PT Start Time  1655    PT Stop Time  1730    PT Time Calculation (min)  35 min    Equipment Utilized During Treatment  Gait belt    Activity Tolerance  Patient tolerated treatment well    Behavior During Therapy  WFL for tasks assessed/performed       Past Medical History:  Diagnosis Date  . Bilateral foot pain    LIKELY NEUROPATHY  . BPH (benign prostatic hyperplasia)   . Cancer (HCC)    ,HX OF PROSTATE CANCER AND BLADDER CANCER  . Collagenous colitis   . COPD (chronic obstructive pulmonary disease) (HCC)    MILD-NO INHALERS PER PT  . Essential tremor   . Frequent falls   . Heart murmur   . History of brachytherapy   . History of TIA (transient ischemic attack) 2015   LEFT THUMB AND INDEX FINGER-SOMETIMES I DROP THINGS  . Hypercholesteremia   . Hypertension   . Hypothyroidism   . PVD (peripheral vascular disease) ([facility])   . Stroke ([place])   . Vitamin D deficiency     Past Surgical History:  Procedure Laterality Date  . CHOLECYSTECTOMY    . CYSTOSCOPY WITH BIOPSY N/A 12/29/2015   Procedure: CYSTOSCOPY WITH BIOPSY;  Surgeon: Eric Cowper, MD;  Location: [facility] ORS;  Service: Urology;  Laterality: N/A;  . DIAGNOSTIC LAPAROSCOPY    . ESOPHAGOGASTRODUODENOSCOPY (EGD) WITH PROPOFOL N/A 02/15/2019   Procedure: ESOPHAGOGASTRODUODENOSCOPY (EGD) WITH PROPOFOL;  Surgeon: Eric Sails, MD;  Location:  [facility] ENDOSCOPY;  Service: Endoscopy;  Laterality: N/A;  . EXPLORATORY LAPAROTOMY  1991   VOLVULUS AND BOWEL OBSTRUCTION  . HERNIA REPAIR    . ILIAC ARTERY STENT Bilateral   . TONSILLECTOMY    . TRANSURETHRAL RESECTION OF BLADDER TUMOR N/A 12/29/2015   Procedure: TRANSURETHRAL RESECTION OF BLADDER TUMOR (TURBT)/MITOMYCIN INSTILLATION;  Surgeon: Eric Cowper, MD;  Location: [facility] ORS;  Service: Urology;  Laterality: N/A;  . TRANSURETHRAL RESECTION OF BLADDER TUMOR WITH GYRUS (TURBT-GYRUS)  2013  . VASECTOMY      There were no vitals filed for this visit.  Subjective Assessment - 05/30/19 1659    Subjective  Pt reports doing well today. He states that he is now noticing a positive difference in his back pain since getting the injections about two weeks ago. No reported falls. No questions or concerns at this time.    Pertinent History  Patient reports his main issue is his balance. States he has a new doctor that refered him to this clinic instead of outpatient in [facility] where he has received physical therapy prior. Has received therapy for balance in the past (approximately 1 year ago). States he has fallen twice recently when he tripped over leg lift of recliner (6 weeks ago), and fell carrying groceries while trying to open  door (4 weeks ago). Reports he does have a fear of falling while walking. States he has circulation issues in his legs that has returned and has not told doctor. Completes water aerobics 2-3x a week. Patient states he also has shoulder pain and back pain, and that shoulder hurts is when he raises arm in abduction against resistance and when rolling over in bed. Used to enjoy playing golf. Would like to be able to walk around the block with his wife.     Limitations  Standing;Walking;House hold activities    How long can you stand comfortably?  5 minutes    How long can you walk comfortably?  5 minutes     Currently in Pain?  No/denies       TREATMENT  Physical Therapy  Telehealth Visit:  I connected with Eric Torres today [id number] Webex video conference and verified that I am speaking with the correct person using two identifiers.I discussed the limitations, risks, security and privacy concerns of performing an evaluation and management service by Webex. I also discussed with the patient that there may be a patient responsible charge related to this service. The patient expressed understanding and agreed to proceed. Identified to the patient that therapist is a licensed physical therapist in the state of [place].  Other persons participating in the visit and their role in the encounter: Daughter present but not assisting with encounter. 1 student PT present with primary therapist and participating in the encounter. Patient's location: Home Patient's address: [address] [name] [address] (confirmed in case of emergency) Patient's phone #: [phone] (confirmed in case of technical difficulties), [phone] Eric Torres, daughter); Provider's location: [place] [facility] re[facility] Patient agreed to evaluation/treatment by telemedicine     Therapeutic Exercise Seated clams with green tband around knees x 1 minute; Seated marches with green tband around knees x 1 minute; Seated isometric adductor squeeze 3s hold x 1 minute; Sit to stand without UE support from regular height as many as possible x1 minute; with diminished height of STS due to fatigue and weakness Seated LAQ 3s hold x10 repetitions each LE    Neuromuscular Re-education All standing balance exercises performed with back to empty corner of room for safety; pt utilizes UE against corner of room for stability Heel/toe raises 3s hold 2x1 minute; with unilateral UE support Standing marches slowly to inc SLS x1 minute  Feet together withECx 1 minute; posterior swaying SemitandemECalternating forward LE x 1 minute on each side; pt demonstrates mod  swaying SemitandemEOhorizontal and then vertical head turns alternating forward LE x1 minute each on each side; Single leg balance alt LE x1 minute; multiple toe touch downs, mod UE support against wall     Pt educated throughout session about proper posture and technique with exercises. Improved exercise technique, movement at target joints, use of target muscles after min to mod verbal cues.   Pt demonstrated good motivation during session.Pt presented to PT with equipment (towel and theraband) to start the session however was 10 minutes late to therapy today. Pt was able to perform all exercises as instructed by therapist today. His balance continues to regress demonstrated by an inc in LOB laterally and posteriorly during balance exercises. Pt demonstrates LOB even once exercises are complete and he has been static standing in which he utilizes many steps and occasional cross over strategy until he is able to reaches the wall to steady. 6MWT, DGI, and stair navigation have been deferred at  this time due to difficulty to assess over telehealth. Pt was encouraged to attempted walking for 15 minutes and stair navigation to address therapy goals. Pt encouraged to continue HEP. He will continue to benefit from skilled PT services to address deficits in strength, balance, and mobility in order to return to full function at home.     PT Education - 05/30/19 1701    Education provided  Yes    Education Details  Exercise technique throughout session    Person(s) Educated  Patient    Methods  Explanation;Verbal cues    Comprehension  Verbal cues required;Verbalized understanding;Returned demonstration       PT Short Term Goals - 05/16/19 1717      PT SHORT TERM GOAL #1   Title  Patient will be independent with completion of HEP to improve ability to complete functional tasks.    Baseline  HEP compliant; 11/27/18: Pt only performing 3d/wk; 05/16/19: pt reports HEP 1x/week    Time  4     Period  Weeks    Status  On-going    Target Date  06/27/19      PT SHORT TERM GOAL #2   Title  Patient will report no falls in the next two weeks to demonstrate improved safety and fall risk. 11/27/18: Pt fell Saturday morning while he was putting something in the bottom shelf of refrigerator    Baseline  07/09/18: fell 2x in 6 weeks 1/7: no falls; 11/27/18: Pt fell 3 days prior to treatment; 12/20/18: No falls since 11/24/18; 05/16/19: LOB to the L reported from yesterday 05/15/19    Time  2    Period  Weeks    Status  On-going    Target Date  06/27/19        PT Long Term Goals - 05/16/19 1719      PT LONG TERM GOAL #1   Title  Patient will improve 5x sit to stand to <10seconds without UE support to demonstrate improvements in functional strength.     Baseline  07/09/18: 13sec hands on knees 1/7: 13 seconds occasional hand on knee; 11/27/18: 12.3s no UE support, occasional posterior leaning; 12/20/18: 11.9s with occasional posterior leaning; 01/29/19: Unable to assess over telemedicine; 03/21/19: Unable to assess over telemedicine; 05/16/19: 10.68s assessed over telehealth    Time  8    Period  Weeks    Status  Partially Met    Target Date  08/08/19      PT LONG TERM GOAL #2   Title  Patient will increase BERG balance score to >52/56 to decrease fall risk and improve balance.     Baseline  07/09/18: 47/56 1/7: 52/56; 11/27/18: 52/56; 12/20/18: 53/56;    Time  8    Period  Weeks    Status  Achieved      PT LONG TERM GOAL #3   Title  Patient will be able to walk with wife around neighborhood (>38mnutes) to demonstrate improved balance and community ambulation.     Baseline  07/09/18: 539mutes 1/7: wife just returned; 11/27/18: has not attempted; 12/20/18: has not attempted yet; 01/30/19: Has not attempted yet; 03/21/19: Has not attempted yet; 05/16/19: Has not attempted fully, he has gone out a couple of times and is "slow so it feels like 15 mins" but not an accurate history    Time  8    Period  Weeks     Status  On-going    Target Date  08/08/19  PT LONG TERM GOAL #4   Title  Patient will improve 44mt to >1.07m limiting fall risk and improve community ambulation.     Baseline  07/09/18: 0.83388m 1/7: 1.25 m/s; 11/27/18: 7.9s=1.27 m/s; 12/20/18: Fastest: 7.5s = 1.33 m/s;    Time  6    Period  Weeks    Status  Achieved      PT LONG TERM GOAL #5   Title  Patient will increase DGI to >18/24 to indicate increased stability with dynamic mobility and decrease fall risk.     Baseline  1/7: 14/24; 11/27/18: 17/24; 12/20/18: 20/24;    Time  6    Period  Weeks    Status  Achieved      PT LONG TERM GOAL #6   Title  Pt able to descend 8 steps with single UE assist with no LOB or heel catching on step in order to improve community access and safety on steps.    Baseline  1/7: BUE support and heel catching; 11/27/18: Heel still catching; 12/20/18: Still requires UE assist; 01/29/19: Unable to assess over telemedicine; 03/21/19: Unable to assess over telemedicine; 05/16/19: unable to assess via telemedicine, reports needing 1 hand rail but has not descending multiple steps    Time  8    Period  Weeks    Status  On-going    Target Date  08/08/19      PT LONG TERM GOAL #7   Title   Pt will improve DGI by at least 3 points in order to demonstrate clinically significant improvement in balance and decreased risk for falls     Baseline  12/20/18: 20/24; 01/29/19: Unable to assess over telemedicine; 03/21/19: Unable to assess over telemedicine    Time  8    Period  Weeks    Status  On-going    Target Date  08/08/19      PT LONG TERM GOAL #8   Title  Pt will increase 6MWT by at least [id number] order to demonstrate clinically significant improvement in cardiopulmonary endurance and community ambulation     Baseline  12/20/18: 1070'; 01/29/19: Unable to assess over telemedicine; 12/20/18: 20/24; 01/29/19: Unable to assess over telemedicine; 03/21/19: Unable to assess over telemedicine    Time  8    Period   Weeks    Status  On-going    Target Date  08/08/19            Plan - 05/30/19 1702    Clinical Impression Statement  Pt demonstrated good motivation during session. Pt presented to PT with equipment (towel and theraband) to start the session however was 10 minutes late to therapy today. Pt was able to perform all exercises as instructed by therapist today. His balance continues to regress demonstrated by an inc in LOB laterally and posteriorly during balance exercises. Pt demonstrates LOB even once exercises are complete and he has been static standing in which he utilizes many steps and occasional cross over strategy until he is able to reaches the wall to steady. 6MWT, DGI, and stair navigation have been deferred at this time due to difficulty to assess over telehealth. Pt was encouraged to attempted walking for 15 minutes and stair navigation to address therapy goals. Pt encouraged to continue HEP. He will continue to benefit from skilled PT services to address deficits in strength, balance, and mobility in order to return to full function at home.    Stability/Clinical Decision Making  Stable/Uncomplicated    Rehab  Potential  Good    Clinical Impairments Affecting Rehab Potential  (+) support system (-) age;     PT Frequency  2x / week    PT Duration  8 weeks    PT Treatment/Interventions  ADLs/Self Care Home Management;Energy conservation;Patient/family education;Therapeutic exercise;Balance training;Neuromuscular re-education;Therapeutic activities;Functional mobility training;Stair training;DME Instruction;Gait training;Aquatic Therapy;Cryotherapy;Electrical Stimulation;Iontophoresis 35m/ml Dexamethasone;Moist Heat;Traction;Ultrasound;Taping;Passive range of motion;Vestibular;Manual techniques    PT Next Visit Plan  strength, balance; static balance while engaged in UE task, head turning activities for balance    PT Home Exercise Plan  Semitandem balance, semitandem balance with horizontal  head turns, single leg balance, heel/toe raises (([id number]    Consulted and Agree with Plan of Care  Patient       Patient will benefit from skilled therapeutic intervention in order to improve the following deficits and impairments:  Abnormal gait, Decreased activity tolerance, Decreased balance, Decreased coordination, Decreased endurance, Decreased mobility, Decreased strength, Difficulty walking, Impaired sensation, Pain, Impaired UE functional use, Postural dysfunction, Improper body mechanics, Impaired perceived functional ability, Decreased knowledge of use of DME  Visit Diagnosis: Unsteadiness on feet  Muscle weakness (generalized)  Impairment of balance     Problem List There are no active problems to display for this patient.  NJuliann PulseSPT  NJuliann Pulse8/20/2020, 5:29 PM  [facility]MAIN [facility] [address] [address]Phone: [phone]  Fax:  [phone] Name: Eric MCGLOCKLINMRN: [id number] of Birth: 707-26-1933

## 2019-06-05 ENCOUNTER — Ambulatory Visit
Admission: RE | Admit: 2019-06-05 | Discharge: 2019-06-05 | Disposition: A | Discharge status: Home / Self Care | Payer: Medicare Other | Source: Ambulatory Visit | Attending: Urology | Admitting: Urology

## 2019-06-05 ENCOUNTER — Other Ambulatory Visit
Admission: RE | Admit: 2019-06-05 | Discharge: 2019-06-05 | Disposition: A | Discharge status: Home / Self Care | Payer: Medicare Other | Source: Home / Self Care | Attending: Urology | Admitting: Urology

## 2019-06-05 ENCOUNTER — Other Ambulatory Visit: Payer: Self-pay

## 2019-06-05 DIAGNOSIS — R310 Gross hematuria: Secondary | ICD-10-CM

## 2019-06-05 LAB — CREATININE, SERUM
Creatinine, Ser: 1.06 mg/dL (ref 0.61–1.24)
GFR calc Af Amer: >60 mL/min (ref >60)
GFR calc non Af Amer: >60 mL/min (ref >60)

## 2019-06-05 MED ORDER — IOHEXOL 300 MG/ML  SOLN
150.0000 mL | Freq: Once | INTRAMUSCULAR | Status: AC | PRN
  Administered 2019-06-05: 125 mL via INTRAVENOUS

## 2019-06-06 ENCOUNTER — Ambulatory Visit: Payer: Medicare Other

## 2019-06-06 DIAGNOSIS — R2689 Other abnormalities of gait and mobility: Secondary | ICD-10-CM

## 2019-06-06 DIAGNOSIS — M6281 Muscle weakness (generalized): Secondary | ICD-10-CM

## 2019-06-06 DIAGNOSIS — R2681 Unsteadiness on feet: Secondary | ICD-10-CM

## 2019-06-06 NOTE — Therapy (Signed)
[place] MAIN [facility] SERVICES [address] [place], [place], [address] Phone: [phone]   Fax:  [phone]  Physical Therapy Treatment  Patient Details  Name: LIBAN GUEDES MRN: [id number] Date of Birth: Jul 14, 1932 Referring Provider (PT): Mcneil Sober   Encounter Date: 06/06/2019  PT End of Session - 06/06/19 1711    Visit Number  51    Number of Visits  72    Date for PT Re-Evaluation  08/08/19    Authorization Type  Last goals: addressed via telemedicine 05/16/19    PT Start Time  1702    PT Stop Time  1730    PT Time Calculation (min)  28 min    Equipment Utilized During Treatment  Gait belt    Activity Tolerance  Patient tolerated treatment well    Behavior During Therapy  WFL for tasks assessed/performed       Past Medical History:  Diagnosis Date  . Bilateral foot pain    LIKELY NEUROPATHY  . BPH (benign prostatic hyperplasia)   . Cancer (HCC)    ,HX OF PROSTATE CANCER AND BLADDER CANCER  . Collagenous colitis   . COPD (chronic obstructive pulmonary disease) (HCC)    MILD-NO INHALERS PER PT  . Essential tremor   . Frequent falls   . Heart murmur   . History of brachytherapy   . History of TIA (transient ischemic attack) 2015   LEFT THUMB AND INDEX FINGER-SOMETIMES I DROP THINGS  . Hypercholesteremia   . Hypertension   . Hypothyroidism   . PVD (peripheral vascular disease) ([place])   . Stroke ([place])   . Vitamin D deficiency     Past Surgical History:  Procedure Laterality Date  . CHOLECYSTECTOMY    . CYSTOSCOPY WITH BIOPSY N/A 12/29/2015   Procedure: CYSTOSCOPY WITH BIOPSY;  Surgeon: Royston Cowper, MD;  Location: [facility] ORS;  Service: Urology;  Laterality: N/A;  . DIAGNOSTIC LAPAROSCOPY    . ESOPHAGOGASTRODUODENOSCOPY (EGD) WITH PROPOFOL N/A 02/15/2019   Procedure: ESOPHAGOGASTRODUODENOSCOPY (EGD) WITH PROPOFOL;  Surgeon: Lollie Sails, MD;  Location: [facility] ENDOSCOPY;  Service: Endoscopy;  Laterality: N/A;  .  EXPLORATORY LAPAROTOMY  1991   VOLVULUS AND BOWEL OBSTRUCTION  . HERNIA REPAIR    . ILIAC ARTERY STENT Bilateral   . TONSILLECTOMY    . TRANSURETHRAL RESECTION OF BLADDER TUMOR N/A 12/29/2015   Procedure: TRANSURETHRAL RESECTION OF BLADDER TUMOR (TURBT)/MITOMYCIN INSTILLATION;  Surgeon: Royston Cowper, MD;  Location: [facility] ORS;  Service: Urology;  Laterality: N/A;  . TRANSURETHRAL RESECTION OF BLADDER TUMOR WITH GYRUS (TURBT-GYRUS)  2013  . VASECTOMY      There were no vitals filed for this visit.  Subjective Assessment - 06/06/19 1705    Subjective  Pt reports doing well today. He states no falls since last session. He thinks his balance has been getting worse over the past month. He expresses a desire to attempt physically coming into PT for an appt in the near future.  No questions or concerns at this time.    Pertinent History  Patient reports his main issue is his balance. States he has a new doctor that refered him to this clinic instead of outpatient in [facility] where he has received physical therapy prior. Has received therapy for balance in the past (approximately 1 year ago). States he has fallen twice recently when he tripped over leg lift of recliner (6 weeks ago), and fell carrying groceries while trying to open door (4 weeks ago).  Reports he does have a fear of falling while walking. States he has circulation issues in his legs that has returned and has not told doctor. Completes water aerobics 2-3x a week. Patient states he also has shoulder pain and back pain, and that shoulder hurts is when he raises arm in abduction against resistance and when rolling over in bed. Used to enjoy playing golf. Would like to be able to walk around the block with his wife.     Limitations  Standing;Walking;House hold activities    How long can you stand comfortably?  5 minutes    How long can you walk comfortably?  5 minutes     Currently in Pain?  No/denies         TREATMENT  Physical Therapy  Telehealth Visit:  I connected with Livio Ledwith today [phone] Webex video conference and verified that I am speaking with the correct person using two identifiers.I discussed the limitations, risks, security and privacy concerns of performing an evaluation and management service by Webex. I also discussed with the patient that there may be a patient responsible charge related to this service. The patient expressed understanding and agreed to proceed. Identified to the patient that therapist is a licensed physical therapist in the state of [place].  Other persons participating in the visit and their role in the encounter: Daughter present but not assisting with encounter. 1 student PT present with primary therapist and participating in the encounter. Patient's location: Home Patient's address: [address] [name] [address] (confirmed in case of emergency) Patient's phone #: [phone] (confirmed in case of technical difficulties), [phone] [name], daughter); Provider's location: [facility] [place] Patient agreed to evaluation/treatment by telemedicine     Therapeutic Exercise Seated clams with green tband around knees x 1 minute; Seated marches with green tband around knees x 1 minute; Seated isometric adductor squeeze 3s hold x 1 minute; Sit to stand without UE support from regular height as many as possible x1 minute; with diminished height of STS due to fatigue and weakness Lunges alt LE x1 minute each  Mini-squatsx1 minute   Neuromuscular Re-education All standing balance exercises performed with back to empty corner of room for safety; pt utilizes UE against corner of room for stability Heel/toe raises 3s hold2x1 minute; with unilateral UE support Standing marches slowly to inc SLS x1 minute Feet together withECx 1 minute; posterior swaying SemitandemECalternating forward LE x 1 minute on each side; pt demonstrates mod  swaying   Pt educated throughout session about proper posture and technique with exercises. Improved exercise technique, movement at target joints, use of target muscles after min to mod verbal cues.   Pt demonstrated good motivation during session.Pt presented to PT with equipment (towel and theraband) to start the session however was 17 minutes late to therapy today. Pt was able to perform all exercises as instructed by therapist today. His balance continues to regress demonstrated by an inc in LOB laterally and posteriorly during balance exercises. He often catches himself against the wall to prevent fall and appears more easily fatigued today requiring intermittent rest breaks. He was encouraged again to pursue an in person therapy session to re-assess goals in person and progress treatment in a safe manner. Pt encouraged to continue HEP. He will continue to benefit from skilled PT services to address deficits in strength, balance, and mobility in order to return to full function at home.       PT  Education - 06/06/19 1711    Education provided  Yes    Education Details  Exercise technique    Person(s) Educated  Patient    Methods  Explanation;Verbal cues    Comprehension  Verbalized understanding;Returned demonstration;Verbal cues required       PT Short Term Goals - 05/16/19 1717      PT SHORT TERM GOAL #1   Title  Patient will be independent with completion of HEP to improve ability to complete functional tasks.    Baseline  HEP compliant; 11/27/18: Pt only performing 3d/wk; 05/16/19: pt reports HEP 1x/week    Time  4    Period  Weeks    Status  On-going    Target Date  06/27/19      PT SHORT TERM GOAL #2   Title  Patient will report no falls in the next two weeks to demonstrate improved safety and fall risk. 11/27/18: Pt fell Saturday morning while he was putting something in the bottom shelf of refrigerator    Baseline  07/09/18: fell 2x in 6 weeks 1/7: no falls; 11/27/18:  Pt fell 3 days prior to treatment; 12/20/18: No falls since 11/24/18; 05/16/19: LOB to the L reported from yesterday 05/15/19    Time  2    Period  Weeks    Status  On-going    Target Date  06/27/19        PT Long Term Goals - 05/16/19 1719      PT LONG TERM GOAL #1   Title  Patient will improve 5x sit to stand to <10seconds without UE support to demonstrate improvements in functional strength.     Baseline  07/09/18: 13sec hands on knees 1/7: 13 seconds occasional hand on knee; 11/27/18: 12.3s no UE support, occasional posterior leaning; 12/20/18: 11.9s with occasional posterior leaning; 01/29/19: Unable to assess over telemedicine; 03/21/19: Unable to assess over telemedicine; 05/16/19: 10.68s assessed over telehealth    Time  8    Period  Weeks    Status  Partially Met    Target Date  08/08/19      PT LONG TERM GOAL #2   Title  Patient will increase BERG balance score to >52/56 to decrease fall risk and improve balance.     Baseline  07/09/18: 47/56 1/7: 52/56; 11/27/18: 52/56; 12/20/18: 53/56;    Time  8    Period  Weeks    Status  Achieved      PT LONG TERM GOAL #3   Title  Patient will be able to walk with wife around neighborhood (>48mnutes) to demonstrate improved balance and community ambulation.     Baseline  07/09/18: 5828mutes 1/7: wife just returned; 11/27/18: has not attempted; 12/20/18: has not attempted yet; 01/30/19: Has not attempted yet; 03/21/19: Has not attempted yet; 05/16/19: Has not attempted fully, he has gone out a couple of times and is "slow so it feels like 15 mins" but not an accurate history    Time  8    Period  Weeks    Status  On-going    Target Date  08/08/19      PT LONG TERM GOAL #4   Title  Patient will improve 10428mto >1.28m/39mimiting fall risk and improve community ambulation.     Baseline  07/09/18: 0.833m/64m/7: 1.25 m/s; 11/27/18: 7.9s=1.27 m/s; 12/20/18: Fastest: 7.5s = 1.33 m/s;    Time  6    Period  Weeks    Status  Achieved  PT LONG TERM GOAL #5    Title  Patient will increase DGI to >18/24 to indicate increased stability with dynamic mobility and decrease fall risk.     Baseline  1/7: 14/24; 11/27/18: 17/24; 12/20/18: 20/24;    Time  6    Period  Weeks    Status  Achieved      PT LONG TERM GOAL #6   Title  Pt able to descend 8 steps with single UE assist with no LOB or heel catching on step in order to improve community access and safety on steps.    Baseline  1/7: BUE support and heel catching; 11/27/18: Heel still catching; 12/20/18: Still requires UE assist; 01/29/19: Unable to assess over telemedicine; 03/21/19: Unable to assess over telemedicine; 05/16/19: unable to assess via telemedicine, reports needing 1 hand rail but has not descending multiple steps    Time  8    Period  Weeks    Status  On-going    Target Date  08/08/19      PT LONG TERM GOAL #7   Title   Pt will improve DGI by at least 3 points in order to demonstrate clinically significant improvement in balance and decreased risk for falls     Baseline  12/20/18: 20/24; 01/29/19: Unable to assess over telemedicine; 03/21/19: Unable to assess over telemedicine    Time  8    Period  Weeks    Status  On-going    Target Date  08/08/19      PT LONG TERM GOAL #8   Title  Pt will increase 6MWT by at least 53m(1650f in order to demonstrate clinically significant improvement in cardiopulmonary endurance and community ambulation     Baseline  12/20/18: 1070'; 01/29/19: Unable to assess over telemedicine; 12/20/18: 20/24; 01/29/19: Unable to assess over telemedicine; 03/21/19: Unable to assess over telemedicine    Time  8    Period  Weeks    Status  On-going    Target Date  08/08/19            Plan - 06/06/19 1712    Clinical Impression Statement  Pt demonstrated good motivation during session. Pt presented to PT with equipment (towel and theraband) to start the session however was 17 minutes late to therapy today. Pt was able to perform all exercises as instructed by therapist  today. His balance continues to regress demonstrated by an inc in LOB laterally and posteriorly during balance exercises. He often catches himself against the wall to prevent fall and appears more easily fatigued today requiring intermittent rest breaks. He was encouraged again to pursue an in person therapy session to re-assess goals in person and progress treatment in a safe manner. Pt encouraged to continue HEP. He will continue to benefit from skilled PT services to address deficits in strength, balance, and mobility in order to return to full function at home.    Stability/Clinical Decision Making  Stable/Uncomplicated    Rehab Potential  Good    Clinical Impairments Affecting Rehab Potential  (+) support system (-) age;     PT Frequency  2x / week    PT Duration  8 weeks    PT Treatment/Interventions  ADLs/Self Care Home Management;Energy conservation;Patient/family education;Therapeutic exercise;Balance training;Neuromuscular re-education;Therapeutic activities;Functional mobility training;Stair training;DME Instruction;Gait training;Aquatic Therapy;Cryotherapy;Electrical Stimulation;Iontophoresis 65m77ml Dexamethasone;Moist Heat;Traction;Ultrasound;Taping;Passive range of motion;Vestibular;Manual techniques    PT Next Visit Plan  strength, balance; static balance while engaged in UE task, head turning activities for balance  PT Home Exercise Plan  Semitandem balance, semitandem balance with horizontal head turns, single leg balance, heel/toe raises ([id number])    Consulted and Agree with Plan of Care  Patient       Patient will benefit from skilled therapeutic intervention in order to improve the following deficits and impairments:  Abnormal gait, Decreased activity tolerance, Decreased balance, Decreased coordination, Decreased endurance, Decreased mobility, Decreased strength, Difficulty walking, Impaired sensation, Pain, Impaired UE functional use, Postural dysfunction, Improper body  mechanics, Impaired perceived functional ability, Decreased knowledge of use of DME  Visit Diagnosis: Unsteadiness on feet  Muscle weakness (generalized)  Impairment of balance     Problem List There are no active problems to display for this patient.  This entire session was performed under direct supervision and direction of a licensed therapist/therapist assistant . I have personally read, edited and approve of the note as written.   Elmyra Ricks Clydell Alberts SPT Phillips Grout PT, DPT, GCS  Huprich,Jason 06/07/2019, 12:55 PM  [place] MAIN [facility] SERVICES [address] [place], [place], [address] Phone: [phone]   Fax:  [phone]  Name: LARENCE THONE MRN: [id number] Date of Birth: [birth year]

## 2019-06-13 ENCOUNTER — Ambulatory Visit: Payer: Medicare Other | Attending: Family Medicine

## 2019-06-13 ENCOUNTER — Other Ambulatory Visit: Payer: Self-pay

## 2019-06-13 DIAGNOSIS — R2681 Unsteadiness on feet: Secondary | ICD-10-CM | POA: Diagnosis not present

## 2019-06-13 DIAGNOSIS — M6281 Muscle weakness (generalized): Secondary | ICD-10-CM

## 2019-06-13 DIAGNOSIS — R269 Unspecified abnormalities of gait and mobility: Secondary | ICD-10-CM | POA: Insufficient documentation

## 2019-06-13 NOTE — Therapy (Signed)
[place] MAIN [facility] SERVICES [address] [place], [place], [address] Phone: [phone]   Fax:  [phone]  Physical Therapy Treatment  Patient Details  Name: Eric Torres MRN: [id number] Date of Birth: [birth year] Referring Provider (PT): Mcneil Sober   Encounter Date: 06/13/2019  PT End of Session - 06/13/19 1651    Visit Number  52    Number of Visits  72    Date for PT Re-Evaluation  08/08/19    Authorization Type  Last goals: 06/13/19    PT Start Time  1646    PT Stop Time  1731    PT Time Calculation (min)  45 min    Equipment Utilized During Treatment  Gait belt    Activity Tolerance  Patient tolerated treatment well    Behavior During Therapy  [facility] for tasks assessed/performed       Past Medical History:  Diagnosis Date  . Bilateral foot pain    LIKELY NEUROPATHY  . BPH (benign prostatic hyperplasia)   . Cancer (HCC)    ,HX OF PROSTATE CANCER AND BLADDER CANCER  . Collagenous colitis   . COPD (chronic obstructive pulmonary disease) (HCC)    MILD-NO INHALERS PER PT  . Essential tremor   . Frequent falls   . Heart murmur   . History of brachytherapy   . History of TIA (transient ischemic attack) 2015   LEFT THUMB AND INDEX FINGER-SOMETIMES I DROP THINGS  . Hypercholesteremia   . Hypertension   . Hypothyroidism   . PVD (peripheral vascular disease) ([place])   . Stroke ([place])   . Vitamin D deficiency     Past Surgical History:  Procedure Laterality Date  . CHOLECYSTECTOMY    . CYSTOSCOPY WITH BIOPSY N/A 12/29/2015   Procedure: CYSTOSCOPY WITH BIOPSY;  Surgeon: Royston Cowper, MD;  Location: [facility] ORS;  Service: Urology;  Laterality: N/A;  . DIAGNOSTIC LAPAROSCOPY    . ESOPHAGOGASTRODUODENOSCOPY (EGD) WITH PROPOFOL N/A 02/15/2019   Procedure: ESOPHAGOGASTRODUODENOSCOPY (EGD) WITH PROPOFOL;  Surgeon: Lollie Sails, MD;  Location: [facility] ENDOSCOPY;  Service: Endoscopy;  Laterality: N/A;  . EXPLORATORY LAPAROTOMY  1991    VOLVULUS AND BOWEL OBSTRUCTION  . HERNIA REPAIR    . ILIAC ARTERY STENT Bilateral   . TONSILLECTOMY    . TRANSURETHRAL RESECTION OF BLADDER TUMOR N/A 12/29/2015   Procedure: TRANSURETHRAL RESECTION OF BLADDER TUMOR (TURBT)/MITOMYCIN INSTILLATION;  Surgeon: Royston Cowper, MD;  Location: [facility] ORS;  Service: Urology;  Laterality: N/A;  . TRANSURETHRAL RESECTION OF BLADDER TUMOR WITH GYRUS (TURBT-GYRUS)  2013  . VASECTOMY      There were no vitals filed for this visit.  Subjective Assessment - 06/13/19 1650    Subjective  Pt reports doing well today. He continues to notice imbalance with extended standing. He states no falls since last session. No questions or concerns at this time.    Pertinent History  Patient reports his main issue is his balance. States he has a new doctor that refered him to this clinic instead of outpatient in [facility] where he has received physical therapy prior. Has received therapy for balance in the past (approximately 1 year ago). States he has fallen twice recently when he tripped over leg lift of recliner (6 weeks ago), and fell carrying groceries while trying to open door (4 weeks ago). Reports he does have a fear of falling while walking. States he has circulation issues in his legs that has returned and has not told  doctor. Completes water aerobics 2-3x a week. Patient states he also has shoulder pain and back pain, and that shoulder hurts is when he raises arm in abduction against resistance and when rolling over in bed. Used to enjoy playing golf. Would like to be able to walk around the block with his wife.     Limitations  Standing;Walking;House hold activities    How long can you stand comfortably?  5 minutes    How long can you walk comfortably?  5 minutes     Currently in Pain?  No/denies         [facility] PT Assessment - 06/13/19 1705      6 Minute Walk- Baseline   6 Minute Walk- Baseline  yes    BP (mmHg)  (!) 163/99    HR (bpm)  63    02 Sat (%RA)   100 %    Modified Borg Scale for Dyspnea  0- Nothing at all    Perceived Rate of Exertion (Borg)  9- very light      6 Minute walk- Post Test   6 Minute Walk Post Test  yes    BP (mmHg)  167/70    HR (bpm)  71    02 Sat (%RA)  99 %    Modified Borg Scale for Dyspnea  8-    Perceived Rate of Exertion (Borg)  12-      6 minute walk test results    Aerobic Endurance Distance Walked  950    Endurance additional comments  With walking stick, increased R foot drag noted toward end of distance      Standardized Balance Assessment   Standardized Balance Assessment  Berg Balance Test;Dynamic Gait Index;Five Times Sit to Stand;10 meter walk test    Five times sit to stand comments   12.7s    10 Meter Walk  Self selected: 12.5s = 0.43ms , Fastest: 9.0s = 1.11 m/s      Berg Balance Test   Sit to Stand  Able to stand without using hands and stabilize independently    Standing Unsupported  Able to stand safely 2 minutes    Sitting with Back Unsupported but Feet Supported on Floor or Stool  Able to sit safely and securely 2 minutes    Stand to Sit  Sits safely with minimal use of hands    Transfers  Able to transfer safely, minor use of hands    Standing Unsupported with Eyes Closed  Able to stand 10 seconds safely    Standing Unsupported with Feet Together  Able to place feet together independently and stand 1 minute safely    From Standing, Reach Forward with Outstretched Arm  Can reach confidently >25 cm (10")    From Standing Position, Pick up Object from Floor  Able to pick up shoe safely and easily    From Standing Position, Turn to Look Behind Over each Shoulder  Looks behind from both sides and weight shifts well    Turn 360 Degrees  Able to turn 360 degrees safely in 4 seconds or less    Standing Unsupported, Alternately Place Feet on Step/Stool  Able to stand independently and safely and complete 8 steps in 20 seconds    Standing Unsupported, One Foot in Front  Able to plae foot ahead of  the other independently and hold 30 seconds    Standing on One Leg  Tries to lift leg/unable to hold 3 seconds but remains standing independently  Total Score  52      Dynamic Gait Index   Level Surface  Mild Impairment    Change in Gait Speed  Normal    Gait with Horizontal Head Turns  Moderate Impairment    Gait with Vertical Head Turns  Mild Impairment    Gait and Pivot Turn  Moderate Impairment    Step Over Obstacle  Normal    Step Around Obstacles  Normal    Steps  Mild Impairment    Total Score  17         TREATMENT   Neuromuscular Re-education Updated outcome measures and goals with patient including: 5TSTS: 12.7s; BERG: 52/56 45mgait speed: Self selected: 12.5s = 0.880m , Fastest: 9.0s = 1.11 m/s DGI:17/24 6MWT: 950'   Pt educated throughout session about proper posture and technique with outcome measures.    Updated outcome measures and goals with patient including 5TSTS, BERG, 1066mit speed, DGI, and 6MWT. He demonstrates decline in most of his outcome measures since they were last assessed. His 5TSTS and gait speed are both slower than previous. His BERG only dropped by one point to 52/56 but his DGI dropped significantly and was 17/24 today. He demonstrated worsening veering of his gait with head turns. His 6MWT total distance dropped as well. Pt encouraged to follow-up in person if he feels comfortable coming into the clinic in light of the COVID 19 pandemic.Pt will benefit from PT services to address deficits in strength, balance, and mobility in order to return to full function at home.                        PT Short Term Goals - 06/13/19 1651      PT SHORT TERM GOAL #1   Title  Patient will be independent with completion of HEP to improve ability to complete functional tasks.    Baseline  HEP compliant; 11/27/18: Pt only performing 3d/wk; 05/16/19: pt reports HEP 1x/week; 06/13/19: performed HEP 3x this week    Time  4    Period   Weeks    Status  On-going    Target Date  06/27/19      PT SHORT TERM GOAL #2   Title  Patient will report no falls in the next two weeks to demonstrate improved safety and fall risk.    Baseline  07/09/18: fell 2x in 6 weeks 1/7: no falls; 11/27/18: Pt fell 3 days prior to treatment; 12/20/18: No falls since 11/24/18; 05/16/19: LOB to the L reported from yesterday 05/15/19; 06/13/19: No falls over the last week    Time  2    Period  Weeks    Status  Achieved    Target Date  06/27/19        PT Long Term Goals - 06/13/19 1652      PT LONG TERM GOAL #1   Title  Patient will improve 5x sit to stand to <10seconds without UE support to demonstrate improvements in functional strength.     Baseline  07/09/18: 13sec hands on knees 1/7: 13 seconds occasional hand on knee; 11/27/18: 12.3s no UE support, occasional posterior leaning; 12/20/18: 11.9s with occasional posterior leaning; 01/29/19: Unable to assess over telemedicine; 03/21/19: Unable to assess over telemedicine; 05/16/19: 10.68s assessed over telehealth; 06/13/19: 12.7s    Time  8    Period  Weeks    Status  Partially Met    Target Date  08/08/19  PT LONG TERM GOAL #2   Title  Patient will increase BERG balance score to >52/56 to decrease fall risk and improve balance.     Baseline  07/09/18: 47/56 1/7: 52/56; 11/27/18: 52/56; 12/20/18: 53/56; 06/13/19: 52/56    Time  8    Period  Weeks    Status  Partially Met    Target Date  08/08/19      PT LONG TERM GOAL #3   Title  Patient will be able to walk with wife around neighborhood (>76mnutes) to demonstrate improved balance and community ambulation.     Baseline  07/09/18: 539mutes 1/7: wife just returned; 11/27/18: has not attempted; 12/20/18: has not attempted yet; 01/30/19: Has not attempted yet; 03/21/19: Has not attempted yet; 05/16/19: Has not attempted fully, he has gone out a couple of times and is "slow so it feels like 15 mins" but not an accurate history,06/13/19: Has completed a lap around  neighborhood but unclear how long it takes him to perform.    Time  8    Period  Weeks    Status  Partially Met    Target Date  08/08/19      PT LONG TERM GOAL #4   Title  Patient will improve 1033mto >1.27m/61mimiting fall risk and improve community ambulation.     Baseline  07/09/18: 0.833m/72m/7: 1.25 m/s; 11/27/18: 7.9s=1.27 m/s; 12/20/18: Fastest: 7.5s = 1.33 m/s; 06/13/19: Self selected: 12.5s = 0.73m/s 37mastest: 9.0s = 1.11 m/s    Time  6    Period  Weeks    Status  Achieved      PT LONG TERM GOAL #5   Title  Patient will increase DGI to >18/24 to indicate increased stability with dynamic mobility and decrease fall risk.     Baseline  1/7: 14/24; 11/27/18: 17/24; 12/20/18: 20/24; 06/13/19: 17/24    Time  6    Period  Weeks    Status  On-going    Target Date  08/08/19      Additional Long Term Goals   Additional Long Term Goals  Yes      PT LONG TERM GOAL #6   Title  Pt able to descend 8 steps with single UE assist with no LOB or heel catching on step in order to improve community access and safety on steps.    Baseline  1/7: BUE support and heel catching; 11/27/18: Heel still catching; 12/20/18: Still requires UE assist; 01/29/19: Unable to assess over telemedicine; 03/21/19: Unable to assess over telemedicine; 05/16/19: unable to assess via telemedicine, reports needing 1 hand rail but has not descending multiple steps; 06/13/19: Heel still catching on step and using BUE support;    Time  8    Period  Weeks    Status  On-going    Target Date  08/08/19      PT LONG TERM GOAL #7   Title   Pt will improve DGI by at least 3 points in order to demonstrate clinically significant improvement in balance and decreased risk for falls     Baseline  12/20/18: 20/24; 01/29/19: Unable to assess over telemedicine; 03/21/19: Unable to assess over telemedicine 06/13/19: 17/24    Time  8    Period  Weeks    Status  On-going    Target Date  08/08/19      PT LONG TERM GOAL #8   Title  Pt will increase 6MWT  by at least 527m (177m) [id number] to  demonstrate clinically significant improvement in cardiopulmonary endurance and community ambulation     Baseline  12/20/18: 1070'; 01/29/19: Unable to assess over telemedicine; 01/29/19: Unable to assess over telemedicine; 03/21/19: Unable to assess over telemedicine; 06/13/19: 950'    Time  8    Period  Weeks    Status  On-going    Target Date  08/08/19            Plan - 06/13/19 1651    Clinical Impression Statement  Updated outcome measures and goals with patient including 5TSTS, BERG, 49mgait speed, DGI, and 6MWT. He demonstrates decline in most of his outcome measures since they were last assessed. His 5TSTS and gait speed are both slower than previous. His BERG only dropped by one point to 52/56 but his DGI dropped significantly and was 17/24 today. He demonstrated worsening veering of his gait with head turns. His 6MWT total distance dropped as well. Pt encouraged to follow-up in person if he feels comfortable coming into the clinic in light of the COVID 19 pandemic. Pt will benefit from PT services to address deficits in strength, balance, and mobility in order to return to full function at home.    Stability/Clinical Decision Making  Stable/Uncomplicated    Rehab Potential  Good    Clinical Impairments Affecting Rehab Potential  (+) support system (-) age;     PT Frequency  2x / week    PT Duration  8 weeks    PT Treatment/Interventions  ADLs/Self Care Home Management;Energy conservation;Patient/family education;Therapeutic exercise;Balance training;Neuromuscular re-education;Therapeutic activities;Functional mobility training;Stair training;DME Instruction;Gait training;Aquatic Therapy;Cryotherapy;Electrical Stimulation;Iontophoresis 46mml Dexamethasone;Moist Heat;Traction;Ultrasound;Taping;Passive range of motion;Vestibular;Manual techniques    PT Next Visit Plan  strength, balance; static balance while engaged in UE task, head turning activities for  balance    PT Home Exercise Plan  Semitandem balance, semitandem balance with horizontal head turns, single leg balance, heel/toe raises ([id number]   Consulted and Agree with Plan of Care  Patient       Patient will benefit from skilled therapeutic intervention in order to improve the following deficits and impairments:  Abnormal gait, Decreased activity tolerance, Decreased balance, Decreased coordination, Decreased endurance, Decreased mobility, Decreased strength, Difficulty walking, Impaired sensation, Pain, Impaired UE functional use, Postural dysfunction, Improper body mechanics, Impaired perceived functional ability, Decreased knowledge of use of DME  Visit Diagnosis: Unsteadiness on feet  Muscle weakness (generalized)     Problem List There are no active problems to display for this patient.  JaPhillips GroutT, DPT, GCS  Kimbra Marcelino 06/14/2019, 10:34 AM  [place] CityAIN RERockwall [facility] HeathERVICES [address]dLake [id number]: [phone] Fax:  [phone]: MaGURFATEH MCCLAINRN: [id number] of Birth: 04/1932-01-20

## 2019-06-20 ENCOUNTER — Ambulatory Visit: Payer: Medicare Other

## 2019-06-20 ENCOUNTER — Other Ambulatory Visit: Payer: Self-pay

## 2019-06-20 DIAGNOSIS — R269 Unspecified abnormalities of gait and mobility: Secondary | ICD-10-CM

## 2019-06-20 DIAGNOSIS — R2681 Unsteadiness on feet: Secondary | ICD-10-CM | POA: Diagnosis not present

## 2019-06-20 DIAGNOSIS — M6281 Muscle weakness (generalized): Secondary | ICD-10-CM

## 2019-06-20 NOTE — Patient Instructions (Signed)
Access Code: [email or web address]  URL: [email or web address]  Date: 06/20/2019  Prepared by: Roxana Hires   Exercises Tandem Stance with Head Rotation - 3 reps - 30 sec x 3 for each leg forward hold - 1x daily - 7x weekly Tandem Walking with Counter Support - 3 reps - 30s hold - 1x daily - 7x weekly Single Leg Stance - 3 reps - 30 seconds x 3 for each leg hold - 1x daily - 7x weekly Heel Toe Raises with Counter Support as Needed - 10 reps - 2 sets - 3 seconds hold - 1x daily - 7x weekly Sit to Stand without Arm Support - 10 reps - 2 sets - 1x daily - 7x weekly Supine Pelvic Tilt - 10 reps - 2 sets - 5s hold - 1x daily - 7x weekly Supine Single Knee to Chest Stretch - 3 reps - 30s x 3 hold - 1x daily - 7x weekly Supine Lower Trunk Rotation - 10 reps - 2 sets - 1x daily - 7x weekly Supine Bridge - 10 reps - 2 sets - 5s hold - 1x daily - 7x weekly Supine Hamstring Stretch with Strap - 3 reps - 3 sets - 30s x 3 on each side hold - 1x daily - 7x weekly

## 2019-06-20 NOTE — Therapy (Signed)
[place] MA[facility] SERVICES [address] [place], [place], [address] Phone: [phone]   Fax:  [phone]  Physical Therapy Treatment/Discharge  Patient Details  Name: TREVYON SWOR MRN: [id number] Date of Birth: Dec 08, 1931 Referring Provider (PT): Mcneil Sober   Encounter Date: 06/20/2019  PT End of Session - 06/20/19 1656    Visit Number  53    Number of Visits  72    Date for PT Re-Evaluation  08/08/19    Authorization Type  Last goals: 06/13/19    PT Start Time  1652    PT Stop Time  1730    PT Time Calculation (min)  38 min    Equipment Utilized During Treatment  Gait belt    Activity Tolerance  Patient tolerated treatment well    Behavior During Therapy  [facility] for tasks assessed/performed       Past Medical History:  Diagnosis Date  . Bilateral foot pain    LIKELY NEUROPATHY  . BPH (benign prostatic hyperplasia)   . Cancer (HCC)    ,HX OF PROSTATE CANCER AND BLADDER CANCER  . Collagenous colitis   . COPD (chronic obstructive pulmonary disease) (HCC)    MILD-NO INHALERS PER PT  . Essential tremor   . Frequent falls   . Heart murmur   . History of brachytherapy   . History of TIA (transient ischemic attack) 2015   LEFT THUMB AND INDEX FINGER-SOMETIMES I DROP THINGS  . Hypercholesteremia   . Hypertension   . Hypothyroidism   . PVD (peripheral vascular disease) ([place])   . Stroke ([place])   . Vitamin D deficiency     Past Surgical History:  Procedure Laterality Date  . CHOLECYSTECTOMY    . CYSTOSCOPY WITH BIOPSY N/A 12/29/2015   Procedure: CYSTOSCOPY WITH BIOPSY;  Surgeon: Royston Cowper, MD;  Location: [facility] ORS;  Service: Urology;  Laterality: N/A;  . DIAGNOSTIC LAPAROSCOPY    . ESOPHAGOGASTRODUODENOSCOPY (EGD) WITH PROPOFOL N/A 02/15/2019   Procedure: ESOPHAGOGASTRODUODENOSCOPY (EGD) WITH PROPOFOL;  Surgeon: Lollie Sails, MD;  Location: [facility] ENDOSCOPY;  Service: Endoscopy;  Laterality: N/A;  . EXPLORATORY  LAPAROTOMY  1991   VOLVULUS AND BOWEL OBSTRUCTION  . HERNIA REPAIR    . ILIAC ARTERY STENT Bilateral   . TONSILLECTOMY    . TRANSURETHRAL RESECTION OF BLADDER TUMOR N/A 12/29/2015   Procedure: TRANSURETHRAL RESECTION OF BLADDER TUMOR (TURBT)/MITOMYCIN INSTILLATION;  Surgeon: Royston Cowper, MD;  Location: [facility] ORS;  Service: Urology;  Laterality: N/A;  . TRANSURETHRAL RESECTION OF BLADDER TUMOR WITH GYRUS (TURBT-GYRUS)  2013  . VASECTOMY      There were no vitals filed for this visit.  Subjective Assessment - 06/20/19 1656    Subjective  Pt reports doing well today. He denies any pain currently and no falls since last session. He reports that he walked every day this week. He states that his wife is uncomfortable with him coming into the hospital due to [place].    Pertinent History  Patient reports his main issue is his balance. States he has a new doctor that refered him to this clinic instead of outpatient in [facility] where he has received physical therapy prior. Has received therapy for balance in the past (approximately 1 year ago). States he has fallen twice recently when he tripped over leg lift of recliner (6 weeks ago), and fell carrying groceries while trying to open door (4 weeks ago). Reports he does have a fear of falling while walking. States  he has circulation issues in his legs that has returned and has not told doctor. Completes water aerobics 2-3x a week. Patient states he also has shoulder pain and back pain, and that shoulder hurts is when he raises arm in abduction against resistance and when rolling over in bed. Used to enjoy playing golf. Would like to be able to walk around the block with his wife.     Limitations  Standing;Walking;House hold activities    How long can you stand comfortably?  5 minutes    How long can you walk comfortably?  5 minutes     Currently in Pain?  No/denies          TREATMENT   Therapeutic Exercise NuStep L2-3 x 4 minutes for warm-up during  history; Quantum leg press 120# x 30, 135# x 20, 150# x 20; Sit to stand without UE support with feet on Airex pad; Supine ant/post Pelvic Tilt 5s hold each direction x 10 each; Supine Single Knee to Chest Stretch x 30s bilateral; Supine Lower Trunk Rotation 5s hold x 10 each direction; Supine Bridge 5s hold at top x 10; Supine Hamstring Stretch x 30s bilateral; Pt issued extensive written HEP with above exercises as well as balance exercises;   Neuromuscular Re-education Heel/toe raises 3s hold2 x 1 minute; with unilateral UE support Tandem Stance with Head Rotation alternating forward LE x 30 sec each; Tandem Walking without UE support in // bars x 4 lengths; Heel Toe Raises without UE support 3s hold x 10 each;   Pt educated throughout session about proper posture and technique with exercises. Improved exercise technique, movement at target joints, use of target muscles after min to mod verbal cues.   Pt reports he would like to discharge from therapy today as his wife is concerned about exposure risk for COVID 19 in the hospital. Pt taken through extensive balance program today as well as issued multiple gentle stretches to help with his chronic low back pain. Goals were just updated at last visit and do not need to be readdressed today. Of note pt did demonstrate a decline in his outcome measures at that time. He is being discharged today with an extensive home program to continue. Pt encouraged that if he declines or fails to continue to improve to obtain a new referral from his MD.                       PT Education - 06/21/19 0951    Education provided  Yes    Education Details  Home exercise program    Person(s) Educated  Patient    Methods  Explanation;Demonstration;Handout;Verbal cues;Tactile cues    Comprehension  Verbalized understanding;Returned demonstration       PT Short Term Goals - 06/13/19 1651      PT SHORT TERM GOAL #1   Title   Patient will be independent with completion of HEP to improve ability to complete functional tasks.    Baseline  HEP compliant; 11/27/18: Pt only performing 3d/wk; 05/16/19: pt reports HEP 1x/week; 06/13/19: performed HEP 3x this week    Time  4    Period  Weeks    Status  On-going    Target Date  06/27/19      PT SHORT TERM GOAL #2   Title  Patient will report no falls in the next two weeks to demonstrate improved safety and fall risk.    Baseline  07/09/18: fell 2x in 6  weeks 1/7: no falls; 11/27/18: Pt fell 3 days prior to treatment; 12/20/18: No falls since 11/24/18; 05/16/19: LOB to the L reported from yesterday 05/15/19; 06/13/19: No falls over the last week    Time  2    Period  Weeks    Status  Achieved    Target Date  06/27/19        PT Long Term Goals - 06/13/19 1652      PT LONG TERM GOAL #1   Title  Patient will improve 5x sit to stand to <10seconds without UE support to demonstrate improvements in functional strength.     Baseline  07/09/18: 13sec hands on knees 1/7: 13 seconds occasional hand on knee; 11/27/18: 12.3s no UE support, occasional posterior leaning; 12/20/18: 11.9s with occasional posterior leaning; 01/29/19: Unable to assess over telemedicine; 03/21/19: Unable to assess over telemedicine; 05/16/19: 10.68s assessed over telehealth; 06/13/19: 12.7s    Time  8    Period  Weeks    Status  Partially Met    Target Date  08/08/19      PT LONG TERM GOAL #2   Title  Patient will increase BERG balance score to >52/56 to decrease fall risk and improve balance.     Baseline  07/09/18: 47/56 1/7: 52/56; 11/27/18: 52/56; 12/20/18: 53/56; 06/13/19: 52/56    Time  8    Period  Weeks    Status  Partially Met    Target Date  08/08/19      PT LONG TERM GOAL #3   Title  Patient will be able to walk with wife around neighborhood (>11mnutes) to demonstrate improved balance and community ambulation.     Baseline  07/09/18: 513mutes 1/7: wife just returned; 11/27/18: has not attempted; 12/20/18: has not  attempted yet; 01/30/19: Has not attempted yet; 03/21/19: Has not attempted yet; 05/16/19: Has not attempted fully, he has gone out a couple of times and is "slow so it feels like 15 mins" but not an accurate history,06/13/19: Has completed a lap around neighborhood but unclear how long it takes him to perform.    Time  8    Period  Weeks    Status  Partially Met    Target Date  08/08/19      PT LONG TERM GOAL #4   Title  Patient will improve 1046mto >1.845m/7845mimiting fall risk and improve community ambulation.     Baseline  07/09/18: 0.833m/27m/7: 1.25 m/s; 11/27/18: 7.9s=1.27 m/s; 12/20/18: Fastest: 7.5s = 1.33 m/s; 06/13/19: Self selected: 12.5s = 0.45m/s 8845mastest: 9.0s = 1.11 m/s    Time  6    Period  Weeks    Status  Achieved      PT LONG TERM GOAL #5   Title  Patient will increase DGI to >18/24 to indicate increased stability with dynamic mobility and decrease fall risk.     Baseline  1/7: 14/24; 11/27/18: 17/24; 12/20/18: 20/24; 06/13/19: 17/24    Time  6    Period  Weeks    Status  On-going    Target Date  08/08/19      Additional Long Term Goals   Additional Long Term Goals  Yes      PT LONG TERM GOAL #6   Title  Pt able to descend 8 steps with single UE assist with no LOB or heel catching on step in order to improve community access and safety on steps.    Baseline  1/7: BUE support and heel  catching; 11/27/18: Heel still catching; 12/20/18: Still requires UE assist; 01/29/19: Unable to assess over telemedicine; 03/21/19: Unable to assess over telemedicine; 05/16/19: unable to assess via telemedicine, reports needing 1 hand rail but has not descending multiple steps; 06/13/19: Heel still catching on step and using BUE support;    Time  8    Period  Weeks    Status  On-going    Target Date  08/08/19      PT LONG TERM GOAL #7   Title   Pt will improve DGI by at least 3 points in order to demonstrate clinically significant improvement in balance and decreased risk for falls     Baseline  12/20/18:  20/24; 01/29/19: Unable to assess over telemedicine; 03/21/19: Unable to assess over telemedicine 06/13/19: 17/24    Time  8    Period  Weeks    Status  On-going    Target Date  08/08/19      PT LONG TERM GOAL #8   Title  Pt will increase 6MWT by at least 57m(1631f in order to demonstrate clinically significant improvement in cardiopulmonary endurance and community ambulation     Baseline  12/20/18: 1070'; 01/29/19: Unable to assess over telemedicine; 01/29/19: Unable to assess over telemedicine; 03/21/19: Unable to assess over telemedicine; 06/13/19: 950'    Time  8    Period  Weeks    Status  On-going    Target Date  08/08/19            Plan - 06/20/19 1657    Clinical Impression Statement  Pt reports he would like to discharge from therapy today as his wife is concerned about exposure risk for COVID 19 in the hospital. Pt taken through extensive balance program today as well as issued multiple gentle stretches to help with his chronic low back pain. Goals were just updated at last visit and do not need to be readdressed today. Of note pt did demonstrate a decline in his outcome measures at that time. He is being discharged today with an extensive home program to continue. Pt encouraged that if he declines or fails to continue to improve to obtain a new referral from his MD.    Stability/Clinical Decision Making  Stable/Uncomplicated    Rehab Potential  Good    Clinical Impairments Affecting Rehab Potential  (+) support system (-) age;     PT Frequency  2x / week    PT Duration  8 weeks    PT Treatment/Interventions  ADLs/Self Care Home Management;Energy conservation;Patient/family education;Therapeutic exercise;Balance training;Neuromuscular re-education;Therapeutic activities;Functional mobility training;Stair training;DME Instruction;Gait training;Aquatic Therapy;Cryotherapy;Electrical Stimulation;Iontophoresis 22m46ml Dexamethasone;Moist Heat;Traction;Ultrasound;Taping;Passive range of  motion;Vestibular;Manual techniques    PT Next Visit Plan  Discharge    PT Home Exercise Plan  Medbridge Code: [id number] Consulted and Agree with Plan of Care  Patient       Patient will benefit from skilled therapeutic intervention in order to improve the following deficits and impairments:  Abnormal gait, Decreased activity tolerance, Decreased balance, Decreased coordination, Decreased endurance, Decreased mobility, Decreased strength, Difficulty walking, Impaired sensation, Pain, Impaired UE functional use, Postural dysfunction, Improper body mechanics, Impaired perceived functional ability, Decreased knowledge of use of DME  Visit Diagnosis: Unsteadiness on feet  Muscle weakness (generalized)  Gait difficulty     Problem List There are no active problems to display for this patient.  JasLyndel Safeprich PT, DPT, GCS  Meline Russaw 06/21/2019, 10:00 AM  [facility] [place], [place], [address] Phone: [phone]   Fax:  [phone]  Name: WASIM HURLBUT MRN: [id number] Date of Birth: 06-16-1932

## 2019-06-26 IMAGING — RF ESOPHAGUS/BARIUM SWALLOW/TABLET STUDY
12 of 15 series · 14 of 24 positions shown · non-contrast
Comparison: None.

CLINICAL DATA: Dysphagia

EXAM:
ESOPHOGRAM / BARIUM SWALLOW / BARIUM TABLET STUDY
TECHNIQUE: Combined double contrast and single contrast examination performed
using effervescent crystals, thick barium liquid, and thin barium
liquid. The patient was observed with fluoroscopy swallowing a 13 mm
barium sulphate tablet.
FLUOROSCOPY TIME:  Fluoroscopy Time:  [DATE]
Radiation Exposure Index (if provided by the fluoroscopic device):
2526 mGy cm2
Number of Acquired Spot Images: 30

[Series 1: fluoro_barium 2fps_bb · 0.17mm/px · 1 of 2 frames shown (1 of 12)]
[frame 1/2]
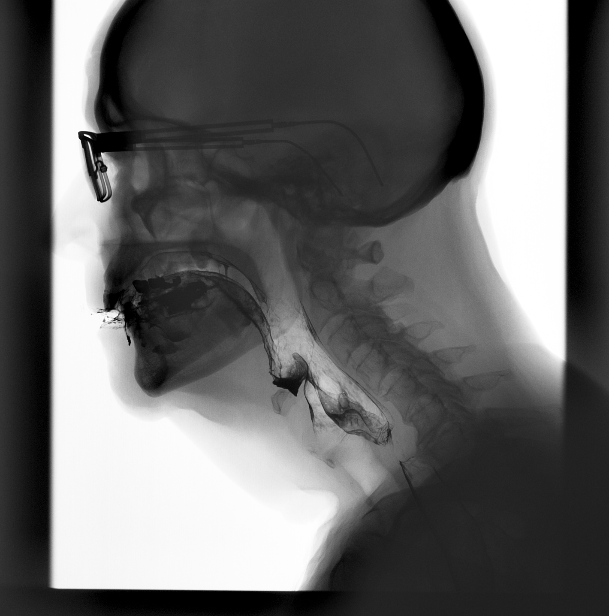

[Series 2: fluoro_barium 2fps_bb · 0.17mm/px · 2 of 7 frames shown (2 of 12)]
[frame 2/7]
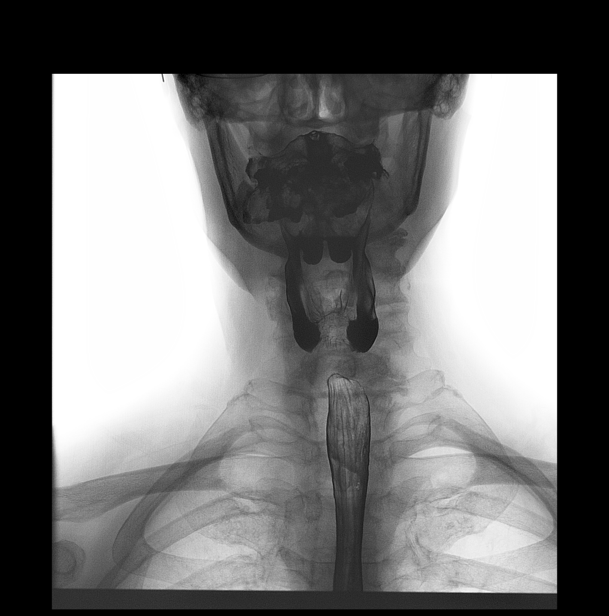
[frame 6/7]
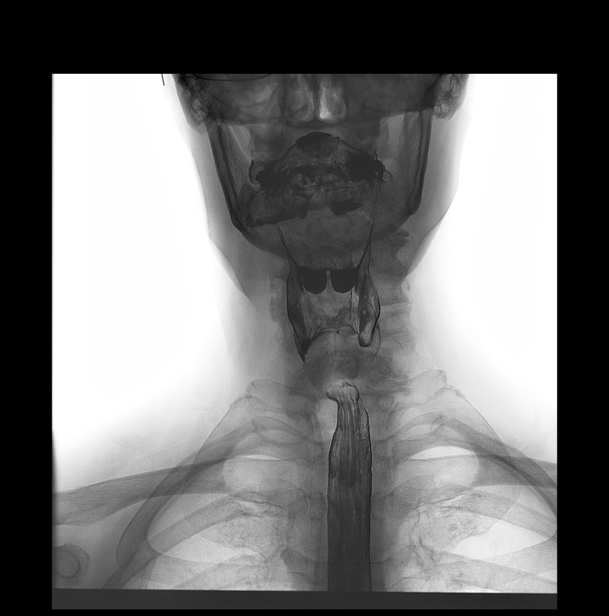

[Series 3: fluoro_barium 2fps_bb · 0.17mm/px · 1 of 2 frames shown (3 of 12)]
[frame 2/2]
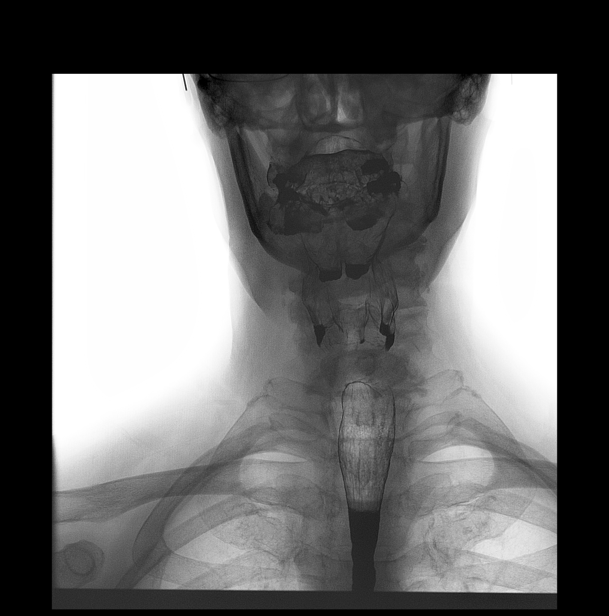

[Series 4: fluoro_barium 2fps_bb · 0.17mm/px · 1 of 1 slices shown (4 of 12)]
[im 1/1]
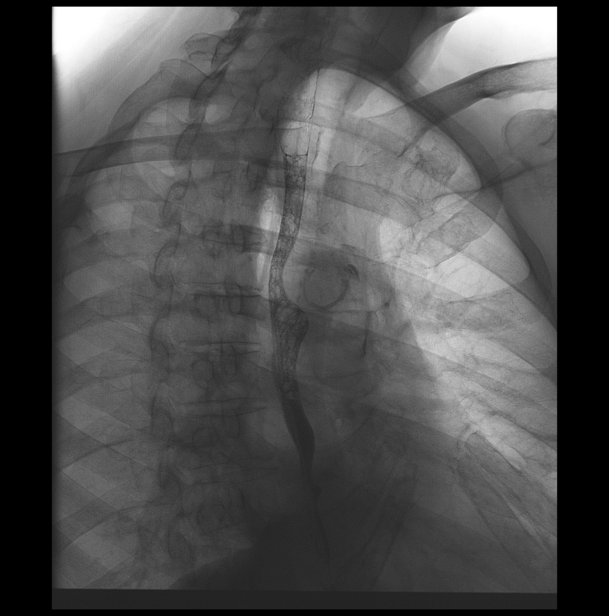

[Series 6: fluoro_barium 2fps_bb · 0.17mm/px · 1 of 2 frames shown (5 of 12)]
[frame 1/2]
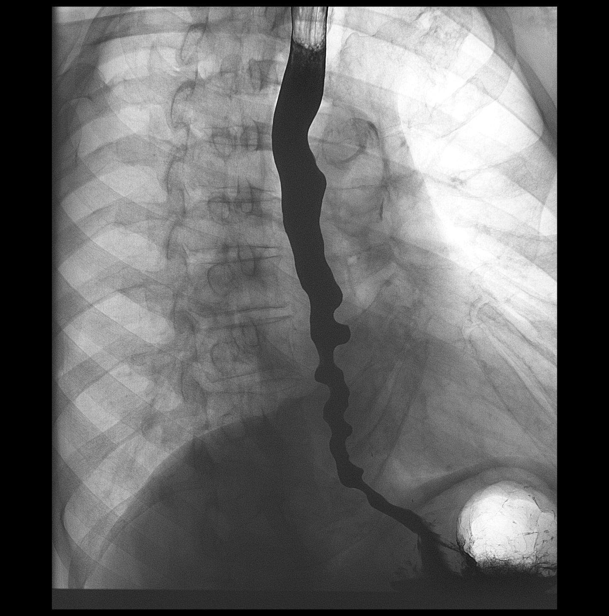

[Series 7: fluoro_barium 2fps_bb · 0.17mm/px · 1 of 1 slices shown (6 of 12)]
[im 1/1]
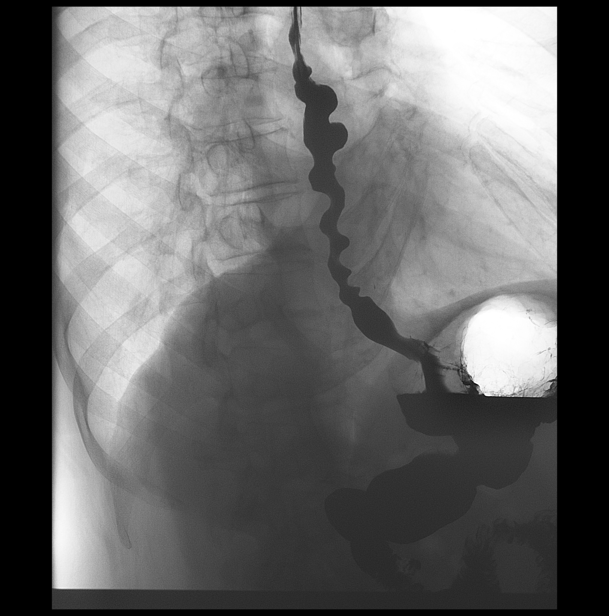

[Series 8: fluoro_barium 2fps_bb · 0.17mm/px · 1 of 2 frames shown (7 of 12)]
[frame 2/2]
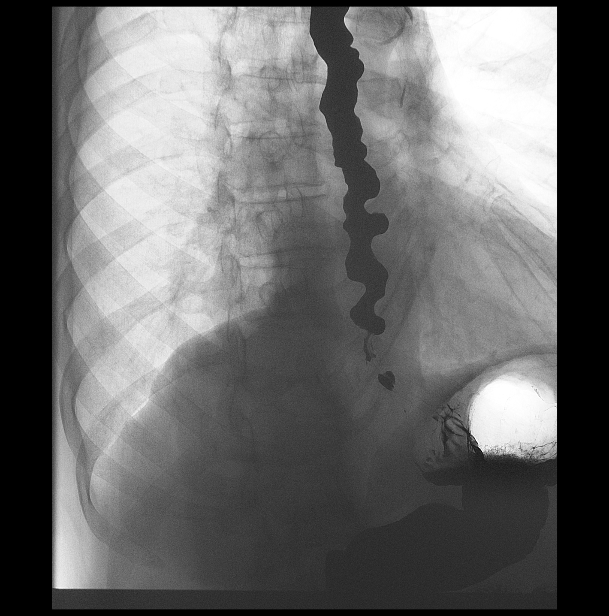

[Series 9: fluoro_barium 2fps_bb · 0.17mm/px · 1 of 2 frames shown (8 of 12)]
[frame 2/2]
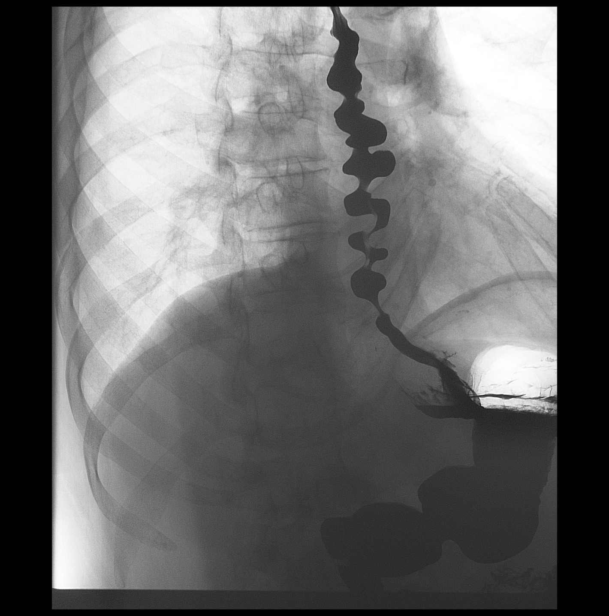

[Series 10: fluoro_barium 2fps_bb · 0.17mm/px · 1 of 2 frames shown (9 of 12)]
[frame 2/2]
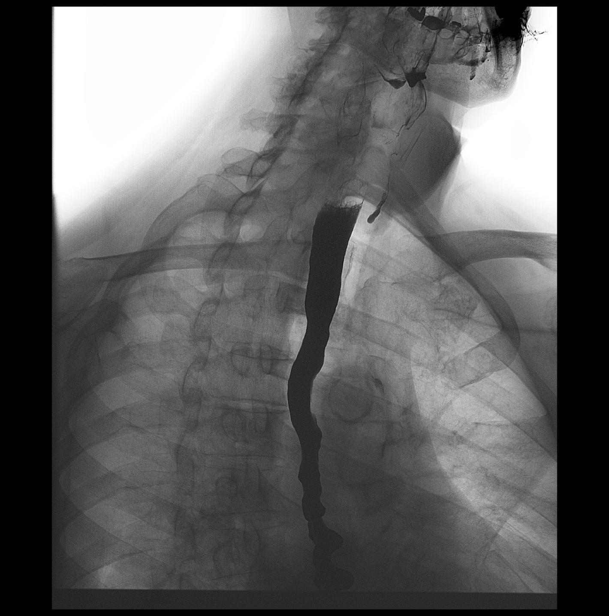

[Series 12: fluoro_barium 2fps_bb · 0.18mm/px · 2 of 2 frames shown (10 of 12)]
[frame 1/2]
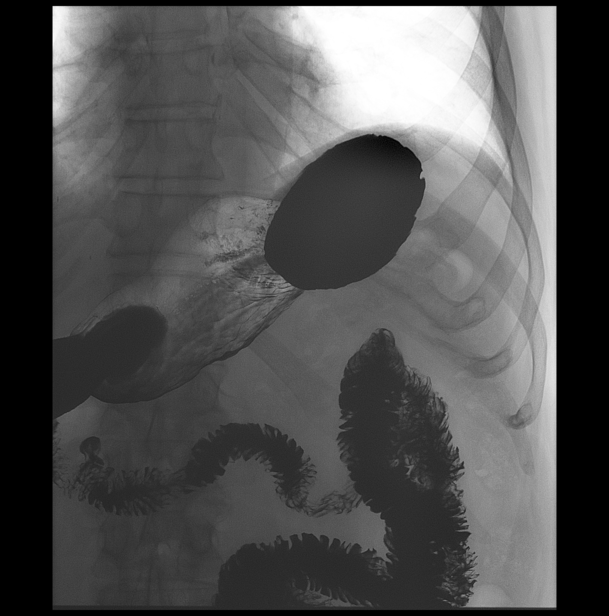
[frame 2/2]
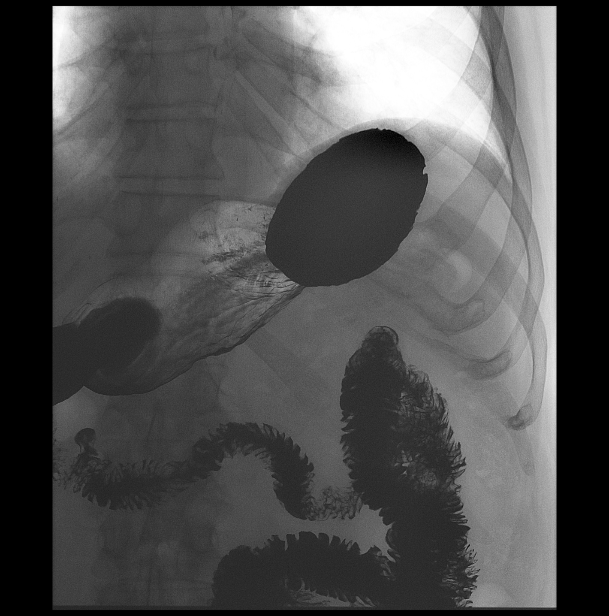

[Series 14: fluoro_barium 2fps_bb · 0.18mm/px · 1 of 2 frames shown (11 of 12)]
[frame 1/2]
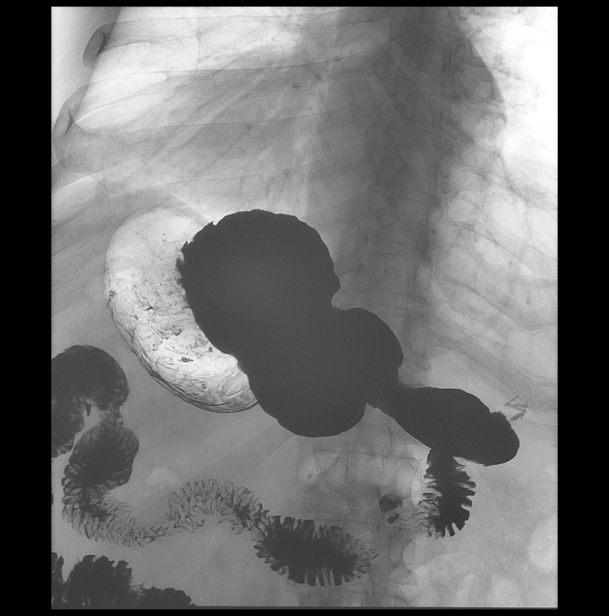

[Series 15: fluoro_barium 2fps_bb · 0.18mm/px · 1 of 1 slices shown (12 of 12)]
[im 1/1]
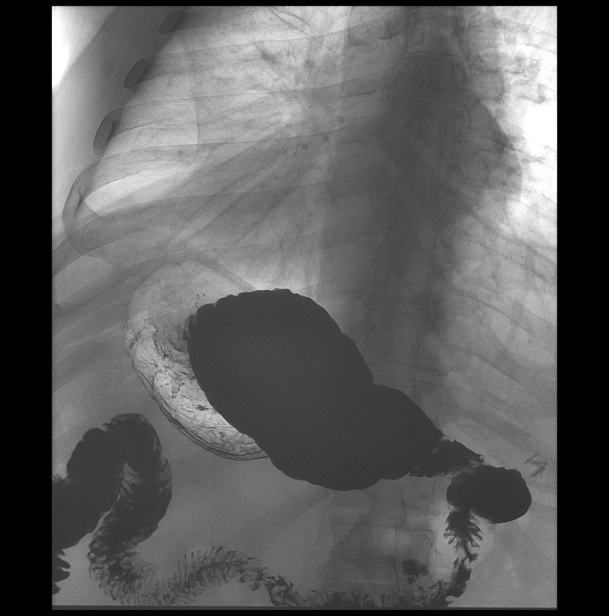

[14 of 24 positions shown; findings below may reference images not displayed]

FINDINGS: The initial oropharyngeal swallows are normal, however following
multiple swallows of thick barium, small volume silent aspiration is
noted and no further barium was administered.

Severe, persistent esophageal dysmotility noted with all swallows.
The gastroesophageal sphincter is not clearly observed to relax,
however a swallowed barium tablet passes readily into the stomach.

No evidence of spontaneous or provoked gastroesophageal reflux.

Normal partial double contrast appearance of the stomach. Incidental
note of a small duodenal diverticulum.
IMPRESSION: 1. The initial oropharyngeal swallows are normal, however following
multiple swallows of thick barium, small volume silent aspiration is
noted and no further barium was administered.

2. Severe, persistent esophageal dysmotility noted with all
swallows. The gastroesophageal sphincter is not clearly observed to
relax, however a swallowed barium tablet passes readily into the
stomach. Lower esophageal mass is not excluded. Consider endoscopic
evaluation if not already performed.

## 2019-07-07 ENCOUNTER — Other Ambulatory Visit: Payer: Self-pay

## 2019-07-07 ENCOUNTER — Emergency Department
Admission: EM | Admit: 2019-07-07 | Discharge: 2019-07-07 | Disposition: A | Discharge status: Home / Self Care | Payer: Medicare Other | Attending: Emergency Medicine | Admitting: Emergency Medicine

## 2019-07-07 ENCOUNTER — Emergency Department: Payer: Medicare Other

## 2019-07-07 DIAGNOSIS — Y929 Unspecified place or not applicable: Secondary | ICD-10-CM | POA: Diagnosis not present

## 2019-07-07 DIAGNOSIS — W19XXXA Unspecified fall, initial encounter: Secondary | ICD-10-CM

## 2019-07-07 DIAGNOSIS — J449 Chronic obstructive pulmonary disease, unspecified: Secondary | ICD-10-CM | POA: Insufficient documentation

## 2019-07-07 DIAGNOSIS — Z87891 Personal history of nicotine dependence: Secondary | ICD-10-CM | POA: Diagnosis not present

## 2019-07-07 DIAGNOSIS — Z7982 Long term (current) use of aspirin: Secondary | ICD-10-CM | POA: Insufficient documentation

## 2019-07-07 DIAGNOSIS — T148XXA Other injury of unspecified body region, initial encounter: Secondary | ICD-10-CM

## 2019-07-07 DIAGNOSIS — W1839XA Other fall on same level, initial encounter: Secondary | ICD-10-CM | POA: Insufficient documentation

## 2019-07-07 DIAGNOSIS — I10 Essential (primary) hypertension: Secondary | ICD-10-CM | POA: Insufficient documentation

## 2019-07-07 DIAGNOSIS — E039 Hypothyroidism, unspecified: Secondary | ICD-10-CM | POA: Diagnosis not present

## 2019-07-07 DIAGNOSIS — S0990XA Unspecified injury of head, initial encounter: Secondary | ICD-10-CM | POA: Diagnosis present

## 2019-07-07 DIAGNOSIS — S0001XA Abrasion of scalp, initial encounter: Secondary | ICD-10-CM | POA: Insufficient documentation

## 2019-07-07 DIAGNOSIS — Z79899 Other long term (current) drug therapy: Secondary | ICD-10-CM | POA: Insufficient documentation

## 2019-07-07 DIAGNOSIS — Y9389 Activity, other specified: Secondary | ICD-10-CM | POA: Diagnosis not present

## 2019-07-07 DIAGNOSIS — Y999 Unspecified external cause status: Secondary | ICD-10-CM | POA: Insufficient documentation

## 2019-07-07 NOTE — Discharge Instructions (Addendum)
I put some skin glue on the back of the head.  Please pat this area dry when you wash your hair.  Please be careful not to comb right in that area because the comb can get stuck in the skin glue and pull it off.  Please return for increasing headache ,nausea, vomiting or grogginess or any other problems.  This could be a sign of a worsening head injury but this is very unlikely as the CAT scan was negative and you have been here for some time without any problems.  Also return for any increasing pain in the area of the abrasion.  That, redness and pus could be signs of infection and should be checked on.Marland Kitchen

## 2019-07-07 NOTE — ED Triage Notes (Signed)
Pt arrives via ems from home, pt was placing items in his backseat of his car which was against the curb and misplaced his foot at the gutter causing him to fall backwards and hit his head on the concrete. Pt denies loc, reports some soreness to the back of his head, denies neck pain. Pt has an abrasion to the back right side of his head, no distress noted, cont to monitor

## 2019-07-07 NOTE — ED Provider Notes (Signed)
[facility] Emergency Department Provider Note   ____________________________________________   First MD Initiated Contact with Patient 07/07/19 1740     (approximate)  I have reviewed the triage vital signs and the nursing notes.   HISTORY  Chief Complaint Fall and Head Injury    HPI Eric Torres is a 85 y.o. male who reports he was putting something in his car stepped backwards and stepped wrong and hit his head when he fell.  He did not pass out.  He does have an abrasion on the back of his head on the right side and in the middle of the abrasion is about a 1 cm laceration.  He is not having any nausea or vomiting or any other complaints at all.        Past Medical History:  Diagnosis Date  . Bilateral foot pain    LIKELY NEUROPATHY  . BPH (benign prostatic hyperplasia)   . Cancer (HCC)    ,HX OF PROSTATE CANCER AND BLADDER CANCER  . Collagenous colitis   . COPD (chronic obstructive pulmonary disease) (HCC)    MILD-NO INHALERS PER PT  . Essential tremor   . Frequent falls   . Heart murmur   . History of brachytherapy   . History of TIA (transient ischemic attack) 2015   LEFT THUMB AND INDEX FINGER-SOMETIMES I DROP THINGS  . Hypercholesteremia   . Hypertension   . Hypothyroidism   . PVD (peripheral vascular disease) ([place])   . Stroke ([place])   . Vitamin D deficiency     There are no active problems to display for this patient.   Past Surgical History:  Procedure Laterality Date  . CHOLECYSTECTOMY    . CYSTOSCOPY WITH BIOPSY N/A 12/29/2015   Procedure: CYSTOSCOPY WITH BIOPSY;  Surgeon: Royston Cowper, MD;  Location: [facility] ORS;  Service: Urology;  Laterality: N/A;  . DIAGNOSTIC LAPAROSCOPY    . ESOPHAGOGASTRODUODENOSCOPY (EGD) WITH PROPOFOL N/A 02/15/2019   Procedure: ESOPHAGOGASTRODUODENOSCOPY (EGD) WITH PROPOFOL;  Surgeon: Lollie Sails, MD;  Location: [facility] ENDOSCOPY;  Service: Endoscopy;  Laterality: N/A;  . EXPLORATORY  LAPAROTOMY  1991   VOLVULUS AND BOWEL OBSTRUCTION  . HERNIA REPAIR    . ILIAC ARTERY STENT Bilateral   . TONSILLECTOMY    . TRANSURETHRAL RESECTION OF BLADDER TUMOR N/A 12/29/2015   Procedure: TRANSURETHRAL RESECTION OF BLADDER TUMOR (TURBT)/MITOMYCIN INSTILLATION;  Surgeon: Royston Cowper, MD;  Location: [facility] ORS;  Service: Urology;  Laterality: N/A;  . TRANSURETHRAL RESECTION OF BLADDER TUMOR WITH GYRUS (TURBT-GYRUS)  2013  . VASECTOMY      Prior to Admission medications   Medication Sig Start Date End Date Taking? Authorizing Provider  aspirin 81 MG tablet Take 81 mg by mouth daily.    [provider]  atenolol (TENORMIN) 50 MG tablet Take 50 mg by mouth every morning.    [provider]  atorvastatin (LIPITOR) 80 MG tablet Take 80 mg by mouth daily at 6 PM.    [provider]  finasteride (PROSCAR) 5 MG tablet Take 5 mg by mouth at bedtime.    [provider]  gabapentin (NEURONTIN) 100 MG capsule Take 100 mg by mouth 2 (two) times daily.    [provider]  levothyroxine (SYNTHROID, LEVOTHROID) 112 MCG tablet Take 112 mcg by mouth at bedtime. MIDNIGHT    [provider]  meloxicam (MOBIC) 7.5 MG tablet Take 7.5 mg by mouth daily.    [provider]  Methen-Hyosc-Meth Ree Kida  Phos (UROGESIC-BLUE) 81.6 MG TABS Take 1 tablet (81.6 mg total) by mouth every 6 (six) hours as needed (dysuria). Patient not taking: Reported on 02/15/2019 12/29/15   Royston Cowper, MD  Nutritional Supplements (BLADDER 2.2) TABS Take 1 tablet by mouth daily.    [provider]  primidone (MYSOLINE) 250 MG tablet Take 250 mg by mouth 2 (two) times daily with a meal.    [provider]  propranolol (INDERAL) 40 MG tablet Take 40 mg by mouth 2 (two) times daily.    [provider]  tamsulosin (FLOMAX) 0.4 MG CAPS capsule Take 0.4 mg by mouth daily after supper.    [provider]  VITAMIN D, CHOLECALCIFEROL, PO Take 1  tablet by mouth daily.    [provider]    Allergies Patient has no known allergies.  No family history on file.  Social History Social History   Tobacco Use  . Smoking status: Former Smoker    Packs/day: 0.50    Years: 35.00    Pack years: 17.50    Types: Cigarettes    Quit date: 12/23/2011    Years since quitting: 7.5  . Smokeless tobacco: Never Used  Substance Use Topics  . Alcohol use: Yes    Comment: OCC  . Drug use: No    Review of Systems  Constitutional: No fever/chills Eyes: No visual changes. ENT: No sore throat. Cardiovascular: Denies chest pain. Respiratory: Denies shortness of breath. Gastrointestinal: No abdominal pain.  No nausea, no vomiting.  No diarrhea.  No constipation. Genitourinary: Negative for dysuria. Musculoskeletal: Negative for back pain. Skin: Negative for rash. Neurological: Negative for headaches, focal weakness  ____________________________________________   PHYSICAL EXAM:  VITAL SIGNS: ED Triage Vitals  Enc Vitals Group     BP 07/07/19 1749 (!) 182/77     Pulse Rate 07/07/19 1749 72     Resp 07/07/19 1749 16     Temp 07/07/19 1749 98.2 F (36.8 C)     Temp Source 07/07/19 1749 Oral     SpO2 07/07/19 1749 99 %     Weight 07/07/19 1750 194 lb (88 kg)     Height 07/07/19 1750 5\' 8"  (1.727 m)     Head Circumference --      Peak Flow --      Pain Score 07/07/19 1749 3     Pain Loc --      Pain Edu? --      Excl. in [place]? --     Constitutional: Alert and oriented. Well appearing and in no acute distress. Eyes: Conjunctivae are normal.  Head: Atraumatic except for 1-1/2 cm diameter abrasion on the right side of the occiput and the laceration which I described in HPI. Nose: No congestion/rhinnorhea. Mouth/Throat: Mucous membranes are moist.  Oropharynx non-erythematous. Neck: No stridor.  Cardiovascular: Normal rate, regular rhythm. Grossly normal heart sounds.  Good peripheral circulation. Respiratory: Normal  respiratory effort.  No retractions. Lungs CTAB. Gastrointestinal: Soft and nontender. No distention. No abdominal bruits.  Musculoskeletal: No lower extremity tenderness nor edema.   Neurologic:  Normal speech and language. No gross focal neurologic deficits are appreciated. Skin:  Skin is warm, dry and intact. No rash noted.   ____________________________________________   LABS (all labs ordered are listed, but only abnormal results are displayed)  Labs Reviewed - No data to display ____________________________________________  EKG   ____________________________________________  RADIOLOGY  ED MD interpretation: CT reviewed by me read by radiology.  Radiology report is not  in epic however it was read to me and it is not showing any acute changes.  Official radiology report(s): No results found.  ____________________________________________   PROCEDURES  Procedure(s) performed (including Critical Care):  Procedures   ____________________________________________   INITIAL IMPRESSION / ASSESSMENT AND PLAN / ED COURSE     Eric Torres was evaluated in Emergency Department on 07/07/2019 for the symptoms described in the history of present illness. He was evaluated in the context of the global COVID-19 pandemic, which necessitated consideration that the patient might be at risk for infection with the SARS-CoV-2 virus that causes COVID-19. Institutional protocols and algorithms that pertain to the evaluation of patients at risk for COVID-19 are in a state of rapid change based on information released by regulatory bodies including the CDC and federal and state organizations. These policies and algorithms were followed during the patient's care in the ED.    Once his CT report returns is negative we clean off the area the laceration is really just a scratch which is been oozing.  This area was cleaned and dried and then I put some Dermabond on it as it has been oozing enough  to ruin the sheet in several spots. Patient tolerated this well.     ____________________________________________   FINAL CLINICAL IMPRESSION(S) / ED DIAGNOSES  Final diagnoses:  Fall, initial encounter  Abrasion     ED Discharge Orders    None       Note:  This document was prepared using Dragon voice recognition software and may include unintentional dictation errors.    Nena Polio, MD 07/07/19 2024

## 2019-07-18 ENCOUNTER — Other Ambulatory Visit: Payer: Self-pay | Admitting: Neurology

## 2019-07-18 DIAGNOSIS — R2689 Other abnormalities of gait and mobility: Secondary | ICD-10-CM

## 2019-07-30 ENCOUNTER — Ambulatory Visit
Admission: RE | Admit: 2019-07-30 | Discharge: 2019-07-30 | Disposition: A | Discharge status: Home / Self Care | Payer: Medicare Other | Source: Ambulatory Visit | Attending: Neurology | Admitting: Neurology

## 2019-07-30 ENCOUNTER — Other Ambulatory Visit: Payer: Self-pay

## 2019-07-30 DIAGNOSIS — R2689 Other abnormalities of gait and mobility: Secondary | ICD-10-CM | POA: Diagnosis not present

## 2019-08-28 ENCOUNTER — Emergency Department: Payer: Medicare Other

## 2019-08-28 ENCOUNTER — Encounter: Payer: Self-pay | Admitting: Emergency Medicine

## 2019-08-28 ENCOUNTER — Other Ambulatory Visit: Payer: Self-pay

## 2019-08-28 ENCOUNTER — Inpatient Hospital Stay
Admission: EM | Admit: 2019-08-28 | Discharge: 2019-09-02 | DRG: 091 | Disposition: A | Discharge status: Home Health | Payer: Medicare Other | Attending: Internal Medicine | Admitting: Internal Medicine

## 2019-08-28 DIAGNOSIS — N400 Enlarged prostate without lower urinary tract symptoms: Secondary | ICD-10-CM | POA: Diagnosis present

## 2019-08-28 DIAGNOSIS — R410 Disorientation, unspecified: Secondary | ICD-10-CM | POA: Diagnosis not present

## 2019-08-28 DIAGNOSIS — R296 Repeated falls: Secondary | ICD-10-CM | POA: Diagnosis present

## 2019-08-28 DIAGNOSIS — Z8673 Personal history of transient ischemic attack (TIA), and cerebral infarction without residual deficits: Secondary | ICD-10-CM

## 2019-08-28 DIAGNOSIS — J189 Pneumonia, unspecified organism: Secondary | ICD-10-CM | POA: Diagnosis not present

## 2019-08-28 DIAGNOSIS — R262 Difficulty in walking, not elsewhere classified: Secondary | ICD-10-CM | POA: Diagnosis present

## 2019-08-28 DIAGNOSIS — Z8546 Personal history of malignant neoplasm of prostate: Secondary | ICD-10-CM

## 2019-08-28 DIAGNOSIS — J440 Chronic obstructive pulmonary disease with acute lower respiratory infection: Secondary | ICD-10-CM | POA: Diagnosis present

## 2019-08-28 DIAGNOSIS — I739 Peripheral vascular disease, unspecified: Secondary | ICD-10-CM | POA: Diagnosis present

## 2019-08-28 DIAGNOSIS — Z87891 Personal history of nicotine dependence: Secondary | ICD-10-CM

## 2019-08-28 DIAGNOSIS — R1310 Dysphagia, unspecified: Secondary | ICD-10-CM | POA: Diagnosis present

## 2019-08-28 DIAGNOSIS — G250 Essential tremor: Secondary | ICD-10-CM | POA: Diagnosis not present

## 2019-08-28 DIAGNOSIS — Z8551 Personal history of malignant neoplasm of bladder: Secondary | ICD-10-CM

## 2019-08-28 DIAGNOSIS — R251 Tremor, unspecified: Secondary | ICD-10-CM | POA: Diagnosis present

## 2019-08-28 DIAGNOSIS — R531 Weakness: Secondary | ICD-10-CM

## 2019-08-28 DIAGNOSIS — Z20828 Contact with and (suspected) exposure to other viral communicable diseases: Secondary | ICD-10-CM | POA: Diagnosis present

## 2019-08-28 DIAGNOSIS — E039 Hypothyroidism, unspecified: Secondary | ICD-10-CM | POA: Diagnosis present

## 2019-08-28 DIAGNOSIS — E785 Hyperlipidemia, unspecified: Secondary | ICD-10-CM | POA: Diagnosis present

## 2019-08-28 DIAGNOSIS — Z66 Do not resuscitate: Secondary | ICD-10-CM | POA: Diagnosis present

## 2019-08-28 DIAGNOSIS — I10 Essential (primary) hypertension: Secondary | ICD-10-CM | POA: Diagnosis present

## 2019-08-28 LAB — CBC
HCT: 39.3 % (ref 39.0–52.0)
Hemoglobin: 14.3 g/dL (ref 13.0–17.0)
MCH: 33.6 pg (ref 26.0–34.0)
MCHC: 36.4 g/dL — ABNORMAL HIGH (ref 30.0–36.0)
MCV: 92.3 fL (ref 80.0–100.0)
Platelets: 226 10*3/uL (ref 150–400)
RBC: 4.26 MIL/uL (ref 4.22–5.81)
RDW: 12.9 % (ref 11.5–15.5)
WBC: 15.7 10*3/uL — ABNORMAL HIGH (ref 4.0–10.5)
nRBC: 0 % (ref 0.0–0.2)

## 2019-08-28 LAB — BASIC METABOLIC PANEL
Anion gap: 12 (ref 5–15)
BUN: 13 mg/dL (ref 8–23)
CO2: 24 mmol/L (ref 22–32)
Calcium: 8.2 mg/dL — ABNORMAL LOW (ref 8.9–10.3)
Chloride: 97 mmol/L — ABNORMAL LOW (ref 98–111)
Creatinine, Ser: 1.09 mg/dL (ref 0.61–1.24)
GFR calc Af Amer: >60 mL/min (ref >60)
GFR calc non Af Amer: >60 mL/min (ref >60)
Glucose, Bld: 126 mg/dL — ABNORMAL HIGH (ref 70–99)
Potassium: 3.7 mmol/L (ref 3.5–5.1)
Sodium: 133 mmol/L — ABNORMAL LOW (ref 135–145)

## 2019-08-28 LAB — PROCALCITONIN: Procalcitonin: <0.1 ng/mL

## 2019-08-28 MED ORDER — ACETAMINOPHEN 650 MG RE SUPP: 650.0000 mg | Freq: Four times a day (QID) | RECTAL | Status: DC | PRN

## 2019-08-28 MED ORDER — CARBIDOPA-LEVODOPA 25-100 MG PO TABS
1.0000 | ORAL_TABLET | Freq: Three times a day (TID) | ORAL | Status: DC
  Administered 2019-08-29: 1 via ORAL
  Filled 2019-08-28 (×3): qty 1

## 2019-08-28 MED ORDER — PANTOPRAZOLE SODIUM 40 MG PO TBEC
40.0000 mg | DELAYED_RELEASE_TABLET | Freq: Every day | ORAL | Status: DC
  Administered 2019-08-29 – 2019-09-02 (×5): 40 mg via ORAL
  Filled 2019-08-28 (×5): qty 1

## 2019-08-28 MED ORDER — ONDANSETRON HCL 4 MG PO TABS: 4.0000 mg | ORAL_TABLET | Freq: Four times a day (QID) | ORAL | Status: DC | PRN

## 2019-08-28 MED ORDER — TAMSULOSIN HCL 0.4 MG PO CAPS
0.4000 mg | ORAL_CAPSULE | Freq: Every day | ORAL | Status: DC
  Administered 2019-08-29 – 2019-09-01 (×4): 0.4 mg via ORAL
  Filled 2019-08-28 (×4): qty 1

## 2019-08-28 MED ORDER — SODIUM CHLORIDE 0.9 % IV SOLN
2.0000 g | INTRAVENOUS | Status: DC
  Administered 2019-08-28 – 2019-09-01 (×5): 2 g via INTRAVENOUS
  Filled 2019-08-28: qty 2
  Filled 2019-08-28 (×4): qty 20
  Filled 2019-08-28 (×2): qty 2

## 2019-08-28 MED ORDER — ONDANSETRON HCL 4 MG/2ML IJ SOLN: 4.0000 mg | Freq: Four times a day (QID) | INTRAMUSCULAR | Status: DC | PRN

## 2019-08-28 MED ORDER — ASPIRIN EC 81 MG PO TBEC
81.0000 mg | DELAYED_RELEASE_TABLET | Freq: Every day | ORAL | Status: DC
  Administered 2019-08-29 – 2019-09-02 (×5): 81 mg via ORAL
  Filled 2019-08-28 (×5): qty 1

## 2019-08-28 MED ORDER — SODIUM CHLORIDE 0.9 % IV SOLN
500.0000 mg | INTRAVENOUS | Status: DC
  Administered 2019-08-28 – 2019-09-01 (×5): 500 mg via INTRAVENOUS
  Filled 2019-08-28 (×6): qty 500

## 2019-08-28 MED ORDER — ACETAMINOPHEN 325 MG PO TABS: 650.0000 mg | ORAL_TABLET | Freq: Four times a day (QID) | ORAL | Status: DC | PRN

## 2019-08-28 MED ORDER — PRIMIDONE 250 MG PO TABS
250.0000 mg | ORAL_TABLET | Freq: Two times a day (BID) | ORAL | Status: DC
  Administered 2019-08-29 – 2019-09-02 (×6): 250 mg via ORAL
  Filled 2019-08-28 (×11): qty 1

## 2019-08-28 MED ORDER — ATORVASTATIN CALCIUM 20 MG PO TABS
80.0000 mg | ORAL_TABLET | Freq: Every day | ORAL | Status: DC
  Administered 2019-08-29 – 2019-09-01 (×4): 80 mg via ORAL
  Filled 2019-08-28 (×4): qty 4

## 2019-08-28 MED ORDER — BLADDER 2.2 PO TABS: 1.0000 | ORAL_TABLET | Freq: Two times a day (BID) | ORAL | Status: DC

## 2019-08-28 MED ORDER — GABAPENTIN 100 MG PO CAPS
100.0000 mg | ORAL_CAPSULE | Freq: Two times a day (BID) | ORAL | Status: DC
  Administered 2019-08-29 – 2019-09-02 (×8): 100 mg via ORAL
  Filled 2019-08-28 (×9): qty 1

## 2019-08-28 MED ORDER — FINASTERIDE 5 MG PO TABS
5.0000 mg | ORAL_TABLET | Freq: Every day | ORAL | Status: DC
  Administered 2019-08-29 – 2019-09-01 (×4): 5 mg via ORAL
  Filled 2019-08-28 (×5): qty 1

## 2019-08-28 MED ORDER — LEVOTHYROXINE SODIUM 112 MCG PO TABS
112.0000 ug | ORAL_TABLET | Freq: Every day | ORAL | Status: DC
  Administered 2019-08-29 – 2019-09-01 (×4): 112 ug via ORAL
  Filled 2019-08-28 (×5): qty 1

## 2019-08-28 MED ORDER — PROPRANOLOL HCL 20 MG PO TABS
20.0000 mg | ORAL_TABLET | Freq: Two times a day (BID) | ORAL | Status: DC
  Administered 2019-08-29 – 2019-09-02 (×9): 20 mg via ORAL
  Filled 2019-08-28 (×10): qty 1

## 2019-08-28 MED ORDER — ENOXAPARIN SODIUM 40 MG/0.4ML ~~LOC~~ SOLN
40.0000 mg | SUBCUTANEOUS | Status: DC
  Administered 2019-08-29 – 2019-09-01 (×5): 40 mg via SUBCUTANEOUS
  Filled 2019-08-28 (×5): qty 0.4

## 2019-08-28 NOTE — ED Provider Notes (Signed)
[facility] Emergency Department Provider Note    First MD Initiated Contact with Patient 08/28/19 1624     (approximate)  I have reviewed the triage vital signs and the nursing notes.   HISTORY  Chief Complaint Weakness    HPI Eric Torres is a 85 y.o. male presents the ER for evaluation of generalized weakness and frequent falls for the past several days.  Did fall and hit his head.  States he has a history of benign essential tremor and family member at bedside feels that his symptoms may have worsened since he recently started [place] levodopa medication.  Does not feel any improvement in the central tremors and actually thinks that the medication may have worsened them.  He denies any numbness or tingling.  No recent fevers.  Does have a cough.    Past Medical History:  Diagnosis Date  . Bilateral foot pain    LIKELY NEUROPATHY  . BPH (benign prostatic hyperplasia)   . Cancer (HCC)    ,HX OF PROSTATE CANCER AND BLADDER CANCER  . Collagenous colitis   . COPD (chronic obstructive pulmonary disease) (HCC)    MILD-NO INHALERS PER PT  . Essential tremor   . Frequent falls   . Heart murmur   . History of brachytherapy   . History of TIA (transient ischemic attack) 2015   LEFT THUMB AND INDEX FINGER-SOMETIMES I DROP THINGS  . Hypercholesteremia   . Hypertension   . Hypothyroidism   . PVD (peripheral vascular disease) ([place])   . Stroke ([place])   . Vitamin D deficiency    No family history on file. Past Surgical History:  Procedure Laterality Date  . CHOLECYSTECTOMY    . CYSTOSCOPY WITH BIOPSY N/A 12/29/2015   Procedure: CYSTOSCOPY WITH BIOPSY;  Surgeon: Royston Cowper, MD;  Location: [facility] ORS;  Service: Urology;  Laterality: N/A;  . DIAGNOSTIC LAPAROSCOPY    . ESOPHAGOGASTRODUODENOSCOPY (EGD) WITH PROPOFOL N/A 02/15/2019   Procedure: ESOPHAGOGASTRODUODENOSCOPY (EGD) WITH PROPOFOL;  Surgeon: Lollie Sails, MD;  Location: [facility] ENDOSCOPY;   Service: Endoscopy;  Laterality: N/A;  . EXPLORATORY LAPAROTOMY  1991   VOLVULUS AND BOWEL OBSTRUCTION  . HERNIA REPAIR    . ILIAC ARTERY STENT Bilateral   . TONSILLECTOMY    . TRANSURETHRAL RESECTION OF BLADDER TUMOR N/A 12/29/2015   Procedure: TRANSURETHRAL RESECTION OF BLADDER TUMOR (TURBT)/MITOMYCIN INSTILLATION;  Surgeon: Royston Cowper, MD;  Location: [facility] ORS;  Service: Urology;  Laterality: N/A;  . TRANSURETHRAL RESECTION OF BLADDER TUMOR WITH GYRUS (TURBT-GYRUS)  2013  . VASECTOMY     Patient Active Problem List   Diagnosis Date Noted  . CAP (community acquired pneumonia) 08/28/2019      Prior to Admission medications   Medication Sig Start Date End Date Taking? Authorizing Provider  aspirin 81 MG tablet Take 81 mg by mouth daily.   Yes [provider]  atorvastatin (LIPITOR) 80 MG tablet Take 80 mg by mouth daily at 6 PM.   Yes [provider]  carbidopa-levodopa (SINEMET IR) 25-100 MG tablet Take 1 tablet by mouth 3 (three) times daily. 08/21/19  Yes [provider]  finasteride (PROSCAR) 5 MG tablet Take 5 mg by mouth at bedtime.   Yes [provider]  gabapentin (NEURONTIN) 100 MG capsule Take 100 mg by mouth 2 (two) times daily.   Yes [provider]  levothyroxine (SYNTHROID, LEVOTHROID) 112 MCG tablet Take 112 mcg by mouth at bedtime.    Yes [provider]  Nutritional Supplements (BLADDER 2.2) TABS Take 1 tablet by mouth 2 (two) times daily.    Yes [provider]  pantoprazole (PROTONIX) 40 MG tablet Take 40 mg by mouth daily with lunch.    Yes [provider]  primidone (MYSOLINE) 250 MG tablet Take 250 mg by mouth 2 (two) times daily with a meal.   Yes [provider]  propranolol (INDERAL) 20 MG tablet Take 20 mg by mouth 2 (two) times daily.    Yes [provider]  tamsulosin (FLOMAX) 0.4 MG CAPS capsule Take 0.4 mg by mouth daily after supper.   Yes [provider]     Allergies Patient has no known allergies.    Social History Social History   Tobacco Use  . Smoking status: Former Smoker    Packs/day: 0.50    Years: 35.00    Pack years: 17.50    Types: Cigarettes    Quit date: 12/23/2011    Years since quitting: 7.6  . Smokeless tobacco: Never Used  Substance Use Topics  . Alcohol use: Yes    Comment: OCC  . Drug use: No    Review of Systems Patient denies headaches, rhinorrhea, blurry vision, numbness, shortness of breath, chest pain, edema, cough, abdominal pain, nausea, vomiting, diarrhea, dysuria, fevers, rashes or hallucinations unless otherwise stated above in HPI. ____________________________________________   PHYSICAL EXAM:  VITAL SIGNS: Vitals:   08/28/19 1810 08/28/19 2106  BP: (!) 144/64 (!) 175/89  Pulse: 72 80  Resp: 18 19  Temp:  98.2 F (36.8 C)  SpO2: 98% 100%    Constitutional: Alert and oriented.  Eyes: Conjunctivae are normal.  Head: Atraumatic. Nose: No congestion/rhinnorhea. Mouth/Throat: Mucous membranes are moist.   Neck: No stridor. Painless ROM.  Cardiovascular: Normal rate, regular rhythm. Grossly normal heart sounds.  Good peripheral circulation. Respiratory: Normal respiratory effort.  No retractions. Lungs CTAB. Gastrointestinal: Soft and nontender. No distention. No abdominal bruits. No CVA tenderness. Genitourinary:  Musculoskeletal: No lower extremity tenderness nor edema.  No joint effusions. Neurologic:  Normal speech and language. No gross focal neurologic deficits are appreciated. No facial droop  BUE resting tremor Skin:  Skin is warm, dry and intact. No rash noted. Psychiatric: Mood and affect are normal. Speech and behavior are normal.  ____________________________________________   LABS (all labs ordered are listed, but only abnormal results are displayed)  Results for orders placed or performed during the hospital encounter of 08/28/19 (from the past 24 hour(s))  Basic  metabolic panel     Status: Abnormal   Collection Time: 08/28/19  1:32 PM  Result Value Ref Range   Sodium 133 (L) 135 - 145 mmol/L   Potassium 3.7 3.5 - 5.1 mmol/L   Chloride 97 (L) 98 - 111 mmol/L   CO2 24 22 - 32 mmol/L   Glucose, Bld 126 (H) 70 - 99 mg/dL   BUN 13 8 - 23 mg/dL   Creatinine, Ser 1.09 0.61 - 1.24 mg/dL   Calcium 8.2 (L) 8.9 - 10.3 mg/dL   GFR calc non Af Amer >60 >60 mL/min   GFR calc Af Amer >60 >60 mL/min   Anion gap 12 5 - 15  CBC     Status: Abnormal   Collection Time: 08/28/19  1:32 PM  Result Value Ref Range   WBC 15.7 (H) 4.0 - 10.5 K/uL   RBC 4.26 4.22 - 5.81 MIL/uL   Hemoglobin 14.3 13.0 - 17.0 g/dL   HCT 39.3 39.0 -  52.0 %   MCV 92.3 80.0 - 100.0 fL   MCH 33.6 26.0 - 34.0 pg   MCHC 36.4 (H) 30.0 - 36.0 g/dL   RDW 12.9 11.5 - 15.5 %   Platelets 226 150 - 400 K/uL   nRBC 0.0 0.0 - 0.2 %  Procalcitonin     Status: None   Collection Time: 08/28/19  7:41 PM  Result Value Ref Range   Procalcitonin <0.10 ng/mL   ____________________________________________  EKG My review and personal interpretation at Time: 13:35   Indication: weakness  Rate: 80  Rhythm: sinus Axis: normal Other: normal intervals, no stemi ____________________________________________  RADIOLOGY  I personally reviewed all radiographic images ordered to evaluate for the above acute complaints and reviewed radiology reports and findings.  These findings were personally discussed with the patient.  Please see medical record for radiology report.  ____________________________________________   PROCEDURES  Procedure(s) performed:  Procedures    Critical Care performed: no ____________________________________________   INITIAL IMPRESSION / ASSESSMENT AND PLAN / ED COURSE  Pertinent labs & imaging results that were available during my care of the patient were reviewed by me and considered in my medical decision making (see chart for details).   DDX: Deconditioning,  dehydration, orthostasis, subdural, IPH, syncope, dysrhythmia, anemia, UTI, pneumonia  AEVIN BERNHEIM is a 85 y.o. who presents to the ED with weakness confusion frequent falls as well as cough.  Not febrile meeting septic criteria no CT imaging ordered for the differential.  Blood work does show leukocytosis.  Chest x-ray shows evidence of right middle lobe infiltrate consistent with community-acquired pneumonia that I suspect is causing the patient's weakness and may be having intermittent hypoxia therefore discussed with hospitalist for admission and medical management.  Have discussed with the patient and available family all diagnostics and treatments performed thus far and all questions were answered to the best of my ability. The patient demonstrates understanding and agreement with plan.      The patient was evaluated in Emergency Department today for the symptoms described in the history of present illness. He/she was evaluated in the context of the global COVID-19 pandemic, which necessitated consideration that the patient might be at risk for infection with the SARS-CoV-2 virus that causes COVID-19. Institutional protocols and algorithms that pertain to the evaluation of patients at risk for COVID-19 are in a state of rapid change based on information released by regulatory bodies including the CDC and federal and state organizations. These policies and algorithms were followed during the patient's care in the ED.  As part of my medical decision making, I reviewed the following data within the [facility] notes reviewed and incorporated, Labs reviewed, notes from prior ED visits and [place] Controlled Substance Database   ____________________________________________   FINAL CLINICAL IMPRESSION(S) / ED DIAGNOSES  Final diagnoses:  Community acquired pneumonia of right middle lobe of lung  Weakness  Confusion      NEW MEDICATIONS STARTED DURING THIS VISIT:  New  Prescriptions   No medications on file     Note:  This document was prepared using Dragon voice recognition software and may include unintentional dictation errors.    Merlyn Lot, MD 08/28/19 2235

## 2019-08-28 NOTE — ED Triage Notes (Signed)
PT from home via EMS with c/o weakness and " problem standing up" states has been going on for a couple of years but worsening xfew days. PT is A&OX4, speech clear.

## 2019-08-28 NOTE — H&P (Signed)
History and Physical    Eric Torres [id number] DOB: 1932-02-14 DOA: 08/28/2019  PCP: Kirk Ruths, MD  Patient coming from: Home.  Chief Complaint: Weakness.  HPI: Eric Torres is a 85 y.o. male with history of essential tremor, hypertension, hyperlipidemia, peripheral vascular disease, hypothyroidism who was recently placed on Sinemet by a neurologist also had an MRI of the brain as outpatient on July 30, 2019 which did not show anything acute has been getting increasingly weak with difficulty to ambulate over the last 1 week.  Patient states he has been chronically having difficulty ambulate with tremors for which patient is on propranolol and primidone and because of the gait difficulty had seen neurologist and was placed on Sinemet.  Patient states since Sinemet was placed patient is feeling more difficulty ambulating last 1 week has found it increasingly difficult and even to walk short distances.  Denies any headache fever chills.  Over the last 10 days patient has been having increasing productive cough.  Also has some difficulty swallowing for the last 1 year.  Has not been evaluated for this.  ED Course: In the ER patient is able to move all extremities 5 x 5 but patient states on walking he finds it difficult to walk because of imbalance and strength.  Has had a fall on September 27 was brought to the ER.  In the ER patient is afebrile labs show sodium 133 creatinine 1 WBC 15.7 platelets 226 hemoglobin 14.3 procalcitonin less than 0.1 EKG shows normal sinus rhythm chest x-ray shows right upper lobe pneumonia.  Patient was started on empiric antibiotics for community-acquired pneumonia admitted for further management.  COVID-19 test is pending.  Review of Systems: As per HPI, rest all negative.   Past Medical History:  Diagnosis Date  . Bilateral foot pain    LIKELY NEUROPATHY  . BPH (benign prostatic hyperplasia)   . Cancer (HCC)    ,HX OF PROSTATE CANCER AND  BLADDER CANCER  . Collagenous colitis   . COPD (chronic obstructive pulmonary disease) (HCC)    MILD-NO INHALERS PER PT  . Essential tremor   . Frequent falls   . Heart murmur   . History of brachytherapy   . History of TIA (transient ischemic attack) 2015   LEFT THUMB AND INDEX FINGER-SOMETIMES I DROP THINGS  . Hypercholesteremia   . Hypertension   . Hypothyroidism   . PVD (peripheral vascular disease) ([place])   . Stroke ([place])   . Vitamin D deficiency     Past Surgical History:  Procedure Laterality Date  . CHOLECYSTECTOMY    . CYSTOSCOPY WITH BIOPSY N/A 12/29/2015   Procedure: CYSTOSCOPY WITH BIOPSY;  Surgeon: Royston Cowper, MD;  Location: [facility] ORS;  Service: Urology;  Laterality: N/A;  . DIAGNOSTIC LAPAROSCOPY    . ESOPHAGOGASTRODUODENOSCOPY (EGD) WITH PROPOFOL N/A 02/15/2019   Procedure: ESOPHAGOGASTRODUODENOSCOPY (EGD) WITH PROPOFOL;  Surgeon: Lollie Sails, MD;  Location[facility] ENDOSCOPY;  Service: Endoscopy;  Laterality: N/A;  . EXPLORATORY LAPAROTOMY  1991   VOLVULUS AND BOWEL OBSTRUCTION  . HERNIA REPAIR    . ILIAC ARTERY STENT Bilateral   . TONSILLECTOMY    . TRANSURETHRAL RESECTION OF BLADDER TUMOR N/A 12/29/2015   Procedure: TRANSURETHRAL RESECTION OF BLADDER TUMOR (TURBT)/MITOMYCIN INSTILLATION;  Surgeon: Royston Cowper, MD;  Location: [facility] ORS;  Service: Urology;  Laterality: N/A;  . TRANSURETHRAL RESECTION OF BLADDER TUMOR WITH GYRUS (TURBT-GYRUS)  2013  . VASECTOMY       reports that  he quit smoking about 7 years ago. His smoking use included cigarettes. He has a 17.50 pack-year smoking history. He has never used smokeless tobacco. He reports current alcohol use. He reports that he does not use drugs.  No Known Allergies  Family History  Family history unknown: Yes    Prior to Admission medications   Medication Sig Start Date End Date Taking? Authorizing Provider  aspirin 81 MG tablet Take 81 mg by mouth daily.   Yes [provider]   atorvastatin (LIPITOR) 80 MG tablet Take 80 mg by mouth daily at 6 PM.   Yes [provider]  carbidopa-levodopa (SINEMET IR) 25-100 MG tablet Take 1 tablet by mouth 3 (three) times daily. 08/21/19  Yes [provider]  finasteride (PROSCAR) 5 MG tablet Take 5 mg by mouth at bedtime.   Yes [provider]  gabapentin (NEURONTIN) 100 MG capsule Take 100 mg by mouth 2 (two) times daily.   Yes [provider]  levothyroxine (SYNTHROID, LEVOTHROID) 112 MCG tablet Take 112 mcg by mouth at bedtime.    Yes [provider]  Nutritional Supplements (BLADDER 2.2) TABS Take 1 tablet by mouth 2 (two) times daily.    Yes [provider]  pantoprazole (PROTONIX) 40 MG tablet Take 40 mg by mouth daily with lunch.    Yes [provider]  primidone (MYSOLINE) 250 MG tablet Take 250 mg by mouth 2 (two) times daily with a meal.   Yes [provider]  propranolol (INDERAL) 20 MG tablet Take 20 mg by mouth 2 (two) times daily.    Yes [provider]  tamsulosin (FLOMAX) 0.4 MG CAPS capsule Take 0.4 mg by mouth daily after supper.   Yes [provider]    Physical Exam: Constitutional: Moderately built and nourished. Vitals:   08/28/19 1327 08/28/19 1329 08/28/19 1810 08/28/19 2106  BP:  (!) 113/50 (!) 144/64 (!) 175/89  Pulse: 91  72 80  Resp: 16  18 19   Temp: 98.3 F (36.8 C)   98.2 F (36.8 C)  TempSrc: Oral   Oral  SpO2: 93%  98% 100%   Eyes: Anicteric no pallor. ENMT: No discharge from the ears eyes nose or mouth. Neck: No mass felt.  No neck rigidity. Respiratory: No rhonchi or crepitations. Cardiovascular: S1-S2 heard. Abdomen: Soft nontender bowel sounds present. Musculoskeletal: No edema. Skin: No rash. Neurologic: Alert awake oriented to time place and person.  Moves all extremities 5 x 5 has upper extremity tremors. Psychiatric: Appears normal but normal affect.   Labs on Admission: I have personally  reviewed following labs and imaging studies  CBC: Recent Labs  Lab 08/28/19 1332  WBC 15.7*  HGB 14.3  HCT 39.3  MCV 92.3  PLT [id number]   Basic Metabolic Panel: Recent Labs  Lab 08/28/19 1332  NA 133*  K 3.7  CL 97*  CO2 24  GLUCOSE 126*  BUN 13  CREATININE 1.09  CALCIUM 8.2*   GFR: CrCl cannot be calculated (Unknown ideal weight.). Liver Function Tests: No results for input(s): AST, ALT, ALKPHOS, BILITOT, PROT, ALBUMIN in the last 168 hours. No results for input(s): LIPASE, AMYLASE in the last 168 hours. No results for input(s): AMMONIA in the last 168 hours. Coagulation Profile: No results for input(s): INR, PROTIME in the last 168 hours. Cardiac Enzymes: No results for input(s): CKTOTAL, CKMB, CKMBINDEX, TROPONINI in the last 168 hours. BNP (last 3 results) No results for input(s): PROBNP in the last 8760 hours.  HbA1C: No results for input(s): HGBA1C in the last 72 hours. CBG: No results for input(s): GLUCAP in the last 168 hours. Lipid Profile: No results for input(s): CHOL, HDL, LDLCALC, TRIG, CHOLHDL, LDLDIRECT in the last 72 hours. Thyroid Function Tests: No results for input(s): TSH, T4TOTAL, FREET4, T3FREE, THYROIDAB in the last 72 hours. Anemia Panel: No results for input(s): VITAMINB12, FOLATE, FERRITIN, TIBC, IRON, RETICCTPCT in the last 72 hours. Urine analysis: No results found for: COLORURINE, APPEARANCEUR, LABSPEC, PHURINE, GLUCOSEU, HGBUR, BILIRUBINUR, KETONESUR, PROTEINUR, UROBILINOGEN, NITRITE, LEUKOCYTESUR Sepsis Labs: @LABRCNTIP (procalcitonin:4,lacticidven:4) )No results found for this or any previous visit (from the past 240 hour(s)).   Radiological Exams on Admission: Dg Chest 2 View  Result Date: 08/28/2019 CLINICAL DATA:  85 year old male with weakness. Evaluate for pneumonia. EXAM: CHEST - 2 VIEW COMPARISON:  Chest CT dated 08/07/2015. FINDINGS: Focal area of opacity in the right upper lobe most consistent with pneumonia. Clinical  correlation and follow-up to resolution recommended. The left lung is clear. There is no pleural effusion or pneumothorax. The cardiac silhouette is within normal limits. Atherosclerotic calcification of the aortic arch. No acute osseous pathology. IMPRESSION: Right upper lobe pneumonia. Clinical correlation and follow-up to resolution recommended. Electronically Signed   By: Anner Crete M.D.   On: 08/28/2019 17:25   Ct Head Wo Contrast  Result Date: 08/28/2019 CLINICAL DATA:  Multiple falls, weakness EXAM: CT HEAD WITHOUT CONTRAST TECHNIQUE: Contiguous axial images were obtained from the base of the skull through the vertex without intravenous contrast. COMPARISON:  07/07/2019 FINDINGS: Brain: No evidence of acute infarction, hemorrhage, hydrocephalus, extra-axial collection or mass lesion/mass effect. There is extensive periventricular and deep white matter hypodensity with dilatation of the lateral and third ventricles ex vacuo. Vascular: No hyperdense vessel or unexpected calcification. Skull: Normal. Negative for fracture or focal lesion. Sinuses/Orbits: No acute finding. Chronic opacification and sinus wall thickening of the left maxillary sinus, status post maxillary antrostomy. Other: None. IMPRESSION: 1.  No acute intracranial pathology. 2. There is extensive small-vessel white matter disease and global volume loss with dilatation of the lateral and third ventricles ex vacuo. 3.  Chronic left maxillary sinus disease. Electronically Signed   By: Eddie Candle M.D.   On: 08/28/2019 17:16    EKG: Independently reviewed.  Normal sinus rhythm.  Assessment/Plan Principal Problem:   CAP (community acquired pneumonia) Active Problems:   Weakness generalized   Tremor    1. Difficulty ambulating with tremors -was recently placed on Sinemet and patient states symptoms worsened after that.  CT head is unremarkable appears nonfocal.  Will get neurology evaluation in the morning. 2. Possible  pneumonia for which patient is on antibiotics.  Will get speech therapy evaluation in the morning since patient states he also has swallowing difficulties.  COVID-19 test is pending. 3. History of tremors on primidone and propranolol. 4. History of hypertension on propanol. 5. History of BPH on tamsulosin. 6. History of peripheral vascular disease on statins. 7. History of prostate and bladder cancer per the chart.  COVID-19 test is pending.   DVT prophylaxis: Lovenox. Code Status: DNR. Family Communication: We will need to discuss with family. Disposition Plan: To be determined. Consults called: Physical therapy. Admission status: Observation.   Rise Patience MD [facility] Pager [phone].  If 7PM-7AM, please contact night-coverage [email or web address] Password [email or web address]  08/28/2019, 11:00 PM

## 2019-08-28 NOTE — ED Triage Notes (Signed)
Pt from home via ems- per family pt has fallen about 5 times in the last 24hrs and family wants him evaluated for weakness. EMS- 122/42, 80, 95RA.

## 2019-08-29 ENCOUNTER — Other Ambulatory Visit: Payer: Self-pay

## 2019-08-29 DIAGNOSIS — R251 Tremor, unspecified: Secondary | ICD-10-CM | POA: Diagnosis present

## 2019-08-29 DIAGNOSIS — J189 Pneumonia, unspecified organism: Secondary | ICD-10-CM

## 2019-08-29 DIAGNOSIS — Z66 Do not resuscitate: Secondary | ICD-10-CM | POA: Diagnosis present

## 2019-08-29 DIAGNOSIS — J181 Lobar pneumonia, unspecified organism: Secondary | ICD-10-CM | POA: Diagnosis not present

## 2019-08-29 DIAGNOSIS — R410 Disorientation, unspecified: Secondary | ICD-10-CM | POA: Diagnosis present

## 2019-08-29 DIAGNOSIS — Z8546 Personal history of malignant neoplasm of prostate: Secondary | ICD-10-CM | POA: Diagnosis not present

## 2019-08-29 DIAGNOSIS — R1310 Dysphagia, unspecified: Secondary | ICD-10-CM | POA: Diagnosis present

## 2019-08-29 DIAGNOSIS — J440 Chronic obstructive pulmonary disease with acute lower respiratory infection: Secondary | ICD-10-CM | POA: Diagnosis present

## 2019-08-29 DIAGNOSIS — Z8551 Personal history of malignant neoplasm of bladder: Secondary | ICD-10-CM | POA: Diagnosis not present

## 2019-08-29 DIAGNOSIS — E039 Hypothyroidism, unspecified: Secondary | ICD-10-CM | POA: Diagnosis present

## 2019-08-29 DIAGNOSIS — R262 Difficulty in walking, not elsewhere classified: Secondary | ICD-10-CM | POA: Diagnosis present

## 2019-08-29 DIAGNOSIS — I739 Peripheral vascular disease, unspecified: Secondary | ICD-10-CM | POA: Diagnosis present

## 2019-08-29 DIAGNOSIS — E785 Hyperlipidemia, unspecified: Secondary | ICD-10-CM | POA: Diagnosis present

## 2019-08-29 DIAGNOSIS — R296 Repeated falls: Secondary | ICD-10-CM | POA: Diagnosis present

## 2019-08-29 DIAGNOSIS — Z8673 Personal history of transient ischemic attack (TIA), and cerebral infarction without residual deficits: Secondary | ICD-10-CM | POA: Diagnosis not present

## 2019-08-29 DIAGNOSIS — I10 Essential (primary) hypertension: Secondary | ICD-10-CM | POA: Diagnosis present

## 2019-08-29 DIAGNOSIS — G250 Essential tremor: Secondary | ICD-10-CM | POA: Diagnosis present

## 2019-08-29 DIAGNOSIS — R531 Weakness: Secondary | ICD-10-CM | POA: Diagnosis not present

## 2019-08-29 DIAGNOSIS — Z20828 Contact with and (suspected) exposure to other viral communicable diseases: Secondary | ICD-10-CM | POA: Diagnosis present

## 2019-08-29 DIAGNOSIS — Z87891 Personal history of nicotine dependence: Secondary | ICD-10-CM | POA: Diagnosis not present

## 2019-08-29 DIAGNOSIS — N400 Enlarged prostate without lower urinary tract symptoms: Secondary | ICD-10-CM | POA: Diagnosis present

## 2019-08-29 LAB — BASIC METABOLIC PANEL
Anion gap: 12 (ref 5–15)
BUN: 13 mg/dL (ref 8–23)
CO2: 26 mmol/L (ref 22–32)
Calcium: 8.1 mg/dL — ABNORMAL LOW (ref 8.9–10.3)
Chloride: 97 mmol/L — ABNORMAL LOW (ref 98–111)
Creatinine, Ser: 1.09 mg/dL (ref 0.61–1.24)
GFR calc Af Amer: >60 mL/min (ref >60)
GFR calc non Af Amer: >60 mL/min (ref >60)
Glucose, Bld: 121 mg/dL — ABNORMAL HIGH (ref 70–99)
Potassium: 3.4 mmol/L — ABNORMAL LOW (ref 3.5–5.1)
Sodium: 135 mmol/L (ref 135–145)

## 2019-08-29 LAB — CBC
HCT: 39.5 % (ref 39.0–52.0)
Hemoglobin: 13.8 g/dL (ref 13.0–17.0)
MCH: 33.1 pg (ref 26.0–34.0)
MCHC: 34.9 g/dL (ref 30.0–36.0)
MCV: 94.7 fL (ref 80.0–100.0)
Platelets: 208 10*3/uL (ref 150–400)
RBC: 4.17 MIL/uL — ABNORMAL LOW (ref 4.22–5.81)
RDW: 13 % (ref 11.5–15.5)
WBC: 12.6 10*3/uL — ABNORMAL HIGH (ref 4.0–10.5)
nRBC: 0 % (ref 0.0–0.2)

## 2019-08-29 LAB — STREP PNEUMONIAE URINARY ANTIGEN: Strep Pneumo Urinary Antigen: NEGATIVE

## 2019-08-29 LAB — SARS CORONAVIRUS 2 (TAT 6-24 HRS): SARS Coronavirus 2: NEGATIVE

## 2019-08-29 LAB — TSH: TSH: 3.552 u[IU]/mL (ref 0.350–4.500)

## 2019-08-29 NOTE — TOC Initial Note (Signed)
Transition of Care [facility]) - Initial/Assessment Note    Patient Details  Name: Eric Torres MRN: [id number] Date of Birth: [birth year]  [facility]) CM/SW Contact:    Anselm Pancoast, RN Phone Number: 08/29/2019, 10:11 AM  Clinical Narrative:                 85 year old male admitted with increased weakness and frequent falls. States he was driving short distances and independent with activities of daily living until a couple weeks ago when he developed increased weakness and tremors. Lives with spouse and adult daughter who assist with care as needed. Patient is very pleasant and eager to return home. Agreeable to home health for PT and nursing stating he has never used home health in the past.   Expected Discharge Plan: [place]     Patient Goals and CMS Choice Patient states their goals for this hospitalization and ongoing recovery are:: Get home as soon as possible      Expected Discharge Plan and Services Expected Discharge Plan: [place]       Living arrangements for the past 2 months: Single Family Home                                      Prior Living Arrangements/Services Living arrangements for the past 2 months: Single Family Home Lives with:: Spouse, Adult Children Patient language and need for interpreter reviewed:: Yes Do you feel safe going back to the place where you live?: Yes      Need for Family Participation in Patient Care: Yes (Comment) Care giver support system in place?: Yes (comment) Current home services: DME(walker and cane) Criminal Activity/Legal Involvement Pertinent to Current Situation/Hospitalization: No - Comment as needed  Activities of Daily Living      Permission Sought/Granted Permission sought to share information with : Case Manager, Customer service manager Permission granted to share information with : Yes, Verbal Permission Granted  Share Information with NAME:  Hanley Seamen     Permission granted to share info w Relationship: daughter     Emotional Assessment Appearance:: Appears stated age Attitude/Demeanor/Rapport: Ambitious Affect (typically observed): Accepting Orientation: : Oriented to Self, Oriented to Place, Oriented to  Time, Oriented to Situation   Psych Involvement: No (comment)  Admission diagnosis:  weakness Patient Active Problem List   Diagnosis Date Noted  . Tremors of nervous system 08/29/2019  . CAP (community acquired pneumonia) 08/28/2019  . Weakness generalized 08/28/2019  . Tremor 08/28/2019   PCP:  Kirk Ruths, MD Pharmacy:   [facility] [address], [place] [address] Phone: [phone] Fax: [phone]     Social Determinants of Health (SDOH) Interventions    Readmission Risk Interventions No flowsheet data found.

## 2019-08-29 NOTE — Evaluation (Signed)
Physical Therapy Evaluation Patient Details Name: Eric Torres MRN: [id number] DOB: [birth year] Today's Date: 08/29/2019   History of Present Illness  Per MD note: Pt is an 85 y.o. male with history of essential tremor, hypertension, hyperlipidemia, peripheral vascular disease, hypothyroidism who was recently placed on Sinemet and had a work up including an MRI of the brain as outpatient that has been getting increasingly weak with difficulty ambulating and increased over the last 1 week.  Patient states he has been chronically having difficulty ambulate with tremors (since 1998) for which patient is on propranolol and primidone and because of the gait difficulty (since 2015) had seen multiple neurologists.  Initially was told that he did not have PD but with progressive symptoms was given a trial of Sinemet on 10/5.  Patient reports gait, ability to rise and tremor have worsened since the start of the Sinemet.  Patient now with diagnosis of PNA, generalized weakness, and tremor.    Clinical Impression  Pt presented with deficits in strength, transfers, mobility, gait, balance, and activity tolerance.  Pt presented with posterior instability in both sitting and standing requiring min A to prevent posterior LOB. After significant time spent performing anterior weight shifting activities in both sitting and standing the pt's stability grossly improved to where he was able to maintain static balance without physical assistance.  Overall the pt remains at a very high risk for falls putting both himself and his caregivers at risk of injury.  Pt will benefit from PT services in a SNF setting upon discharge to safely address above deficits for decreased caregiver assistance and eventual return to PLOF.      Follow Up Recommendations SNF    Equipment Recommendations  None recommended by PT    Recommendations for Other Services       Precautions / Restrictions Precautions Precautions:  Fall Restrictions Weight Bearing Restrictions: No      Mobility  Bed Mobility Overal bed mobility: Needs Assistance Bed Mobility: Supine to Sit     Supine to sit: Min assist     General bed mobility comments: Min A to prevent posterior LOB during sup to sit  Transfers Overall transfer level: Needs assistance Equipment used: Rolling walker (2 wheeled) Transfers: Sit to/from Stand Sit to Stand: Min assist         General transfer comment: Min A to prevent posterior LOB upon standing with mod verbal and tactile cues for proper sequencing  Ambulation/Gait Ambulation/Gait assistance: Min assist Gait Distance (Feet): 20 Feet Assistive device: Rolling walker (2 wheeled) Gait Pattern/deviations: Step-through pattern;Decreased step length - right;Decreased step length - left;Trunk flexed Gait velocity: decreased   General Gait Details: Min A for stability during amb with mod verbal cues for amb closer to the [employer]    Modified Rankin (Stroke Patients Only)       Balance Overall balance assessment: Needs assistance Sitting-balance support: Bilateral upper extremity supported;Feet supported Sitting balance-Leahy Scale: Poor   Postural control: Posterior lean Standing balance support: Bilateral upper extremity supported Standing balance-Leahy Scale: Poor Standing balance comment: Posterior instability in standing                             Pertinent Vitals/Pain Pain Assessment: No/denies pain    Home Living Family/patient expects to be discharged to:: Private residence Living Arrangements: Spouse/significant other;Children Available Help at Discharge: Family;Available 24  hours/day Type of Home: House Home Access: Stairs to enter Entrance Stairs-Rails: None Entrance Stairs-Number of Steps: 1 Home Layout: Two level;Able to live on main level with bedroom/bathroom Home Equipment: Gilford Rile - 2 wheels;Walker - 4  wheels Additional Comments: Owns walking stick    Prior Function Level of Independence: Independent with assistive device(s)         Comments: Mod Ind amb with a "walking stick" limited community distances, averaging around one fall per week secondary to "legs giving out"     Hand Dominance        Extremity/Trunk Assessment   Upper Extremity Assessment Upper Extremity Assessment: Generalized weakness    Lower Extremity Assessment Lower Extremity Assessment: Generalized weakness;RLE deficits/detail;LLE deficits/detail RLE Deficits / Details: RLE strength grossly 4/5 LLE Deficits / Details: LLE strength grossly 4/5       Communication   Communication: HOH  Cognition Arousal/Alertness: Awake/alert Behavior During Therapy: WFL for tasks assessed/performed Overall Cognitive Status: Within Functional Limits for tasks assessed                                        General Comments      Exercises Total Joint Exercises Ankle Circles/Pumps: Strengthening;Both;5 reps;10 reps Quad Sets: Strengthening;Both;10 reps Gluteal Sets: Strengthening;Both;10 reps Heel Slides: AROM;Both;5 reps Hip ABduction/ADduction: AROM;Both;10 reps;Strengthening Long Arc Quad: Strengthening;Both;10 reps Knee Flexion: Strengthening;Both;10 reps Other Exercises Other Exercises: Anterior weight shifting activities in sitting at EOB and standing at sink counter to reduce posterior instability   Assessment/Plan    PT Assessment Patient needs continued PT services  PT Problem List Decreased strength;Decreased activity tolerance;Decreased balance;Decreased mobility;Decreased knowledge of use of DME       PT Treatment Interventions DME instruction;Gait training;Stair training;Functional mobility training;Therapeutic activities;Therapeutic exercise;Balance training;Patient/family education;Neuromuscular re-education    PT Goals (Current goals can be found in the Care Plan section)   Acute Rehab PT Goals Patient Stated Goal: To get stronger PT Goal Formulation: With patient Time For Goal Achievement: 09/11/19 Potential to Achieve Goals: Good    Frequency Min 2X/week   Barriers to discharge Inaccessible home environment;Decreased caregiver support      Co-evaluation               AM-PAC PT "6 Clicks" Mobility  Outcome Measure Help needed turning from your back to your side while in a flat bed without using bedrails?: A Little Help needed moving from lying on your back to sitting on the side of a flat bed without using bedrails?: A Little Help needed moving to and from a bed to a chair (including a wheelchair)?: A Little Help needed standing up from a chair using your arms (e.g., wheelchair or bedside chair)?: A Little Help needed to walk in hospital room?: A Little Help needed climbing 3-5 steps with a railing? : A Lot 6 Click Score: 17    End of Session Equipment Utilized During Treatment: Gait belt Activity Tolerance: Patient tolerated treatment well Patient left: Other (comment)(Pt left in BR with spouse in room with pt educated to call nsg prior to getting up) Nurse Communication: Mobility status;Other (comment)(Nsg notified that pt in BR requiring time for BM) PT Visit Diagnosis: Unsteadiness on feet (R26.81);History of falling (Z91.81);Repeated falls (R29.6);Difficulty in walking, not elsewhere classified (R26.2);Muscle weakness (generalized) (M62.81)    Time: [id number] PT Time Calculation (min) (ACUTE ONLY): 52 min   Charges:   PT Evaluation $PT Eval  Moderate Complexity: 1 Mod PT Treatments $Therapeutic Exercise: 8-22 mins $Therapeutic Activity: 8-22 mins        D. Royetta Asal PT, DPT 08/29/19, 4:41 PM

## 2019-08-29 NOTE — Consult Note (Signed)
Reason for Consult:Tremor, weakness Referring Physician: Swayze  CC: Tremor, weakness  HPI: Eric Torres is an 85 y.o. male with history of essential tremor, hypertension, hyperlipidemia, peripheral vascular disease, hypothyroidism who was recently placed on Sinemet by Dr. Manuella Ghazi and had a work up including an MRI of the brain as outpatient that has been getting increasingly weak with difficulty ambulating and increased over the last 1 week.  Patient states he has been chronically having difficulty ambulate with tremors (since 1998) for which patient is on propranolol and primidone and because of the gait difficulty (since 2015) had seen multiple neurologists.  Initially was told that he did not have PD but with progressive symptoms was given a trial of Sinemet on 10/5.  Patient reports gait, ability to rise and tremor have worsened since the start of the Sinemet.    Patient now with diagnosis of PNA.  Past Medical History:  Diagnosis Date  . Bilateral foot pain    LIKELY NEUROPATHY  . BPH (benign prostatic hyperplasia)   . Cancer (HCC)    ,HX OF PROSTATE CANCER AND BLADDER CANCER  . Collagenous colitis   . COPD (chronic obstructive pulmonary disease) (HCC)    MILD-NO INHALERS PER PT  . Essential tremor   . Frequent falls   . Heart murmur   . History of brachytherapy   . History of TIA (transient ischemic attack) 2015   LEFT THUMB AND INDEX FINGER-SOMETIMES I DROP THINGS  . Hypercholesteremia   . Hypertension   . Hypothyroidism   . PVD (peripheral vascular disease) ([place])   . Stroke ([place])   . Vitamin D deficiency     Past Surgical History:  Procedure Laterality Date  . CHOLECYSTECTOMY    . CYSTOSCOPY WITH BIOPSY N/A 12/29/2015   Procedure: CYSTOSCOPY WITH BIOPSY;  Surgeon: Royston Cowper, MD;  Location: [facility] ORS;  Service: Urology;  Laterality: N/A;  . DIAGNOSTIC LAPAROSCOPY    . ESOPHAGOGASTRODUODENOSCOPY (EGD) WITH PROPOFOL N/A 02/15/2019   Procedure: ESOPHAGOGASTRODUODENOSCOPY  (EGD) WITH PROPOFOL;  Surgeon: Lollie Sails, MD;  Location: [facility] ENDOSCOPY;  Service: Endoscopy;  Laterality: N/A;  . EXPLORATORY LAPAROTOMY  1991   VOLVULUS AND BOWEL OBSTRUCTION  . HERNIA REPAIR    . ILIAC ARTERY STENT Bilateral   . TONSILLECTOMY    . TRANSURETHRAL RESECTION OF BLADDER TUMOR N/A 12/29/2015   Procedure: TRANSURETHRAL RESECTION OF BLADDER TUMOR (TURBT)/MITOMYCIN INSTILLATION;  Surgeon: Royston Cowper, MD;  Location: [facility] ORS;  Service: Urology;  Laterality: N/A;  . TRANSURETHRAL RESECTION OF BLADDER TUMOR WITH GYRUS (TURBT-GYRUS)  2013  . VASECTOMY      Family History  Family history unknown: Yes    Social History:  reports that he quit smoking about 7 years ago. His smoking use included cigarettes. He has a 17.50 pack-year smoking history. He has never used smokeless tobacco. He reports current alcohol use. He reports that he does not use drugs.  No Known Allergies  Medications:  I have reviewed the patient's current medications. Prior to Admission:  Prior to Admission medications   Medication Sig Start Date End Date Taking? Authorizing Provider  aspirin 81 MG tablet Take 81 mg by mouth daily.   Yes [provider]  atorvastatin (LIPITOR) 80 MG tablet Take 80 mg by mouth daily at 6 PM.   Yes [provider]  carbidopa-levodopa (SINEMET IR) 25-100 MG tablet Take 1 tablet by mouth 3 (three) times daily. 08/21/19  Yes [provider]  finasteride (PROSCAR) 5 MG tablet  Take 5 mg by mouth at bedtime.   Yes [provider]  gabapentin (NEURONTIN) 100 MG capsule Take 100 mg by mouth 2 (two) times daily.   Yes [provider]  levothyroxine (SYNTHROID, LEVOTHROID) 112 MCG tablet Take 112 mcg by mouth at bedtime.    Yes [provider]  Nutritional Supplements (BLADDER 2.2) TABS Take 1 tablet by mouth 2 (two) times daily.    Yes [provider]  pantoprazole (PROTONIX) 40 MG tablet Take 40 mg by mouth daily  with lunch.    Yes [provider]  primidone (MYSOLINE) 250 MG tablet Take 250 mg by mouth 2 (two) times daily with a meal.   Yes [provider]  propranolol (INDERAL) 20 MG tablet Take 20 mg by mouth 2 (two) times daily.    Yes [provider]  tamsulosin (FLOMAX) 0.4 MG CAPS capsule Take 0.4 mg by mouth daily after supper.   Yes [provider]     Scheduled: . aspirin EC  81 mg Oral Daily  . atorvastatin  80 mg Oral q1800  . carbidopa-levodopa  1 tablet Oral TID  . enoxaparin (LOVENOX) injection  40 mg Subcutaneous Q24H  . finasteride  5 mg Oral QHS  . gabapentin  100 mg Oral BID  . levothyroxine  112 mcg Oral QHS  . pantoprazole  40 mg Oral Q lunch  . primidone  250 mg Oral BID WC  . propranolol  20 mg Oral BID  . tamsulosin  0.4 mg Oral QPC supper    ROS: History obtained from the patient  General ROS: negative for - chills, fatigue, fever, night sweats, weight gain or weight loss Psychological ROS: negative for - behavioral disorder, hallucinations, memory difficulties, mood swings or suicidal ideation Ophthalmic ROS: negative for - blurry vision, double vision, eye pain or loss of vision ENT ROS: difficulty swallowing Allergy and Immunology ROS: negative for - hives or itchy/watery eyes Hematological and Lymphatic ROS: negative for - bleeding problems, bruising or swollen lymph nodes Endocrine ROS: negative for - galactorrhea, hair pattern changes, polydipsia/polyuria or temperature intolerance Respiratory ROS: cough Cardiovascular ROS: negative for - chest pain, dyspnea on exertion, edema or irregular heartbeat Gastrointestinal ROS: negative for - abdominal pain, diarrhea, hematemesis, nausea/vomiting or stool incontinence Genito-Urinary ROS: urinary urgency Musculoskeletal ROS: back pain Neurological ROS: as noted in HPI Dermatological ROS: negative for rash and skin lesion changes  Physical Examination: Blood pressure (!) 141/74,  pulse 83, temperature 98.1 F (36.7 C), temperature source Oral, resp. rate 19, SpO2 95 %.  HEENT-  Normocephalic, no lesions, without obvious abnormality.  Normal external eye and conjunctiva.  Normal TM's bilaterally.  Normal auditory canals and external ears. Normal external nose, mucus membranes and septum.  Normal pharynx. Cardiovascular- S1, S2 normal, pulses palpable throughout   Lungs- decreased breath sounds Abdomen- soft, non-tender; bowel sounds normal; no masses,  no organomegaly Extremities- no edema Lymph-no adenopathy palpable Musculoskeletal-no joint tenderness, deformity or swelling Skin-warm and dry, no hyperpigmentation, vitiligo, or suspicious lesions  Neurological Examination   Mental Status: Alert, oriented, thought content appropriate.  Speech fluent without evidence of aphasia.  Able to follow 3 step commands without difficulty. Cranial Nerves: II: Visual fields grossly normal, pupils equal, round, reactive to light and accommodation III,IV, VI: ptosis not present, extra-ocular motions intact bilaterally V,VII: smile symmetric, facial light touch sensation normal bilaterally VIII: hearing normal bilaterally IX,X: gag reflex present XI: bilateral shoulder shrug XII: midline tongue extension Motor: Right : Upper  extremity   5/5    Left:     Upper extremity   5/5  Lower extremity   5/5     Lower extremity   5/5 Tone and bulk:normal tone throughout; no atrophy noted Sensory: Pinprick and light touch intact throughout, bilaterally Deep Tendon Reflexes: Symmetric throughout Plantars: Right: mute   Left: mute Cerebellar: Normal finger-to-nose testing with mild tremor on the left and increased tremor on the right.  Normal heel-to-shin testing bilaterally Gait: not tested due to safety concerns    Laboratory Studies:   Basic Metabolic Panel: Recent Labs  Lab 08/28/19 1332 08/29/19 0632  NA 133* 135  K 3.7 3.4*  CL 97* 97*  CO2 24 26  GLUCOSE 126* 121*   BUN 13 13  CREATININE 1.09 1.09  CALCIUM 8.2* 8.1*    Liver Function Tests: No results for input(s): AST, ALT, ALKPHOS, BILITOT, PROT, ALBUMIN in the last 168 hours. No results for input(s): LIPASE, AMYLASE in the last 168 hours. No results for input(s): AMMONIA in the last 168 hours.  CBC: Recent Labs  Lab 08/28/19 1332 08/29/19 0632  WBC 15.7* 12.6*  HGB 14.3 13.8  HCT 39.3 39.5  MCV 92.3 94.7  PLT 226 208    Cardiac Enzymes: No results for input(s): CKTOTAL, CKMB, CKMBINDEX, TROPONINI in the last 168 hours.  BNP: Invalid input(s): POCBNP  CBG: No results for input(s): GLUCAP in the last 168 hours.  Microbiology: Results for orders placed or performed during the hospital encounter of 08/28/19  Blood culture (routine x 2)     Status: None (Preliminary result)   Collection Time: 08/28/19  7:41 PM   Specimen: BLOOD  Result Value Ref Range Status   Specimen Description BLOOD RIGHT ANTECUBITAL  Final   Special Requests   Final    BOTTLES DRAWN AEROBIC AND ANAEROBIC Blood Culture results may not be optimal due to an inadequate volume of blood received in culture bottles   Culture   Final    NO GROWTH < 12 HOURS Performed at [facility], [address], [place], [place] [address]    Report Status PENDING  Incomplete  Blood culture (routine x 2)     Status: None (Preliminary result)   Collection Time: 08/28/19  7:41 PM   Specimen: BLOOD  Result Value Ref Range Status   Specimen Description BLOOD  Final   Special Requests NONE  Final   Culture   Final    NO GROWTH < 12 HOURS Performed at [facility], [address] [place],  [address]    Report Status PENDING  Incomplete  SARS CORONAVIRUS 2 (TAT 6-24 HRS) Nasopharyngeal Nasopharyngeal Swab     Status: None   Collection Time: 08/28/19  7:41 PM   Specimen: Nasopharyngeal Swab  Result Value Ref Range Status   SARS Coronavirus 2 NEGATIVE NEGATIVE Final    Comment: (NOTE) SARS-CoV-2  target nucleic acids are NOT DETECTED. The SARS-CoV-2 RNA is generally detectable in upper and lower respiratory specimens during the acute phase of infection. Negative results do not preclude SARS-CoV-2 infection, do not rule out co-infections with other pathogens, and should not be used as the sole basis for treatment or other patient management decisions. Negative results must be combined with clinical observations, patient history, and epidemiological information. The expected result is Negative. Fact Sheet for Patients: SugarRoll.be Fact Sheet for Healthcare Providers: https://www.woods-mathews.com/ This test is not yet approved or cleared by the [place] and  has been authorized  for detection and/or diagnosis of SARS-CoV-2 by FDA under an Emergency Use Authorization (EUA). This EUA will remain  in effect (meaning this test can be used) for the duration of the COVID-19 declaration under Section 56 4(b)(1) of the Act, 21 U.S.C. section 360bbb-3(b)(1), unless the authorization is terminated or revoked sooner. Performed at [facility], [place] [address] [place], [place] [address]     Coagulation Studies: No results for input(s): LABPROT, INR in the last 72 hours.  Urinalysis: No results for input(s): COLORURINE, LABSPEC, PHURINE, GLUCOSEU, HGBUR, BILIRUBINUR, KETONESUR, PROTEINUR, UROBILINOGEN, NITRITE, LEUKOCYTESUR in the last 168 hours.  Invalid input(s): APPERANCEUR  Lipid Panel:  No results found for: CHOL, TRIG, HDL, CHOLHDL, VLDL, LDLCALC  HgbA1C: No results found for: HGBA1C  Urine Drug Screen:  No results found for: LABOPIA, COCAINSCRNUR, LABBENZ, AMPHETMU, THCU, LABBARB  Alcohol Level: No results for input(s): ETH in the last 168 hours.  Other results: EKG: normal sinus rhythm at 81 bpm.  Imaging: Dg Chest 2 View  Result Date: 08/28/2019 CLINICAL DATA:  85 year old male with weakness. Evaluate for  pneumonia. EXAM: CHEST - 2 VIEW COMPARISON:  Chest CT dated 08/07/2015. FINDINGS: Focal area of opacity in the right upper lobe most consistent with pneumonia. Clinical correlation and follow-up to resolution recommended. The left lung is clear. There is no pleural effusion or pneumothorax. The cardiac silhouette is within normal limits. Atherosclerotic calcification of the aortic arch. No acute osseous pathology. IMPRESSION: Right upper lobe pneumonia. Clinical correlation and follow-up to resolution recommended. Electronically Signed   By: Anner Crete M.D.   On: 08/28/2019 17:25   Ct Head Wo Contrast  Result Date: 08/28/2019 CLINICAL DATA:  Multiple falls, weakness EXAM: CT HEAD WITHOUT CONTRAST TECHNIQUE: Contiguous axial images were obtained from the base of the skull through the vertex without intravenous contrast. COMPARISON:  07/07/2019 FINDINGS: Brain: No evidence of acute infarction, hemorrhage, hydrocephalus, extra-axial collection or mass lesion/mass effect. There is extensive periventricular and deep white matter hypodensity with dilatation of the lateral and third ventricles ex vacuo. Vascular: No hyperdense vessel or unexpected calcification. Skull: Normal. Negative for fracture or focal lesion. Sinuses/Orbits: No acute finding. Chronic opacification and sinus wall thickening of the left maxillary sinus, status post maxillary antrostomy. Other: None. IMPRESSION: 1.  No acute intracranial pathology. 2. There is extensive small-vessel white matter disease and global volume loss with dilatation of the lateral and third ventricles ex vacuo. 3.  Chronic left maxillary sinus disease. Electronically Signed   By: Eddie Candle M.D.   On: 08/28/2019 17:16     Assessment/Plan: 85 year old male with history of essential tremor, hypertension, hyperlipidemia, peripheral vascular disease, hypothyroidism who reports worsening of his symptoms since the initiation of Sinemet.  Patient's symptoms of tremor  and gait difficulties have been chronic but progressive.  Has seen multiple neurologists on an outpatient basis and currently seeing Dr. Manuella Ghazi with [facility].  Patient's work up has included a MRI of the brain that was significant for small vessel disease.  Patient has also had a MRI of the lumbar spine that showed multiple areas of mild neuro-foraminal stenosis but no evidence of spinal stenosis.  Since symptoms have worsened on the Sinemet would not continue with this trial at this time.  Currently with infection which may also be contributing to worsening of symptoms.  TSH and UPEP are normal    Recommendations: 1. D/C Sinemet 2. Follow up with Dr. Manuella Ghazi on an outpatient basis   Alexis Goodell, MD  Neurology [phone] 08/29/2019, 10:32 AM

## 2019-08-30 LAB — BASIC METABOLIC PANEL
Anion gap: 12 (ref 5–15)
BUN: 14 mg/dL (ref 8–23)
CO2: 25 mmol/L (ref 22–32)
Calcium: 7.8 mg/dL — ABNORMAL LOW (ref 8.9–10.3)
Chloride: 100 mmol/L (ref 98–111)
Creatinine, Ser: 1.02 mg/dL (ref 0.61–1.24)
GFR calc Af Amer: >60 mL/min (ref >60)
GFR calc non Af Amer: >60 mL/min (ref >60)
Glucose, Bld: 108 mg/dL — ABNORMAL HIGH (ref 70–99)
Potassium: 3.3 mmol/L — ABNORMAL LOW (ref 3.5–5.1)
Sodium: 137 mmol/L (ref 135–145)

## 2019-08-30 LAB — CBC
HCT: 35.2 % — ABNORMAL LOW (ref 39.0–52.0)
Hemoglobin: 12.5 g/dL — ABNORMAL LOW (ref 13.0–17.0)
MCH: 33.3 pg (ref 26.0–34.0)
MCHC: 35.5 g/dL (ref 30.0–36.0)
MCV: 93.9 fL (ref 80.0–100.0)
Platelets: 213 10*3/uL (ref 150–400)
RBC: 3.75 MIL/uL — ABNORMAL LOW (ref 4.22–5.81)
RDW: 13.1 % (ref 11.5–15.5)
WBC: 10.9 10*3/uL — ABNORMAL HIGH (ref 4.0–10.5)
nRBC: 0 % (ref 0.0–0.2)

## 2019-08-30 LAB — LEGIONELLA PNEUMOPHILA SEROGP 1 UR AG: L. pneumophila Serogp 1 Ur Ag: NEGATIVE

## 2019-08-30 MED ORDER — POTASSIUM CHLORIDE CRYS ER 20 MEQ PO TBCR
40.0000 meq | EXTENDED_RELEASE_TABLET | Freq: Once | ORAL | Status: AC
  Administered 2019-08-30: 40 meq via ORAL
  Filled 2019-08-30: qty 2

## 2019-08-30 MED ORDER — SODIUM CHLORIDE 0.9 % IV SOLN
INTRAVENOUS | Status: DC | PRN
  Administered 2019-08-30: 250 mL via INTRAVENOUS

## 2019-08-30 NOTE — TOC Progression Note (Signed)
Transition of Care [facility]) - Progression Note    Patient Details  Name: Eric Torres MRN: [id number] Date of Birth: June 17, 1932  Transition of Care [facility]) CM/SW Contact  Beverly Sessions, RN Phone Number: 08/30/2019, 2:40 PM  Clinical Narrative:     PT has assessed patient and recommends SNF RNCM met with patient.  He states he will be declining SNF.  RNCM discussed the option of home health  Patient states that he would like RNCM to explain this to both him and his wife at the same time.  Wife has gone home and will not be back till later this afternoon.  RNCM offered to call and explain to wife the options.  Patient declines and states "I want her to hear it at the same time as me"  RNCM offered to call wife at bedside on speaker phone.  Patient declined  RNCM offered for weekend TOC to follow up, he wanted a specific time, I told him I was unable to give him a specific time, however I would notify my coworker   Expected Discharge Plan: [place]    Expected Discharge Plan and Services Expected Discharge Plan: [place] arrangements for the past 2 months: Single Family Home                                       Social Determinants of Health (SDOH) Interventions    Readmission Risk Interventions No flowsheet data found.

## 2019-08-30 NOTE — Progress Notes (Signed)
PROGRESS NOTE  Eric Torres [id number] DOB: January 06, 1932 DOA: 08/28/2019 PCP: Kirk Ruths, MD  Brief History   Eric Torres is a 85 y.o. male with history of essential tremor, hypertension, hyperlipidemia, peripheral vascular disease, hypothyroidism who was recently placed on Sinemet by a neurologist also had an MRI of the brain as outpatient on July 30, 2019 which did not show anything acute has been getting increasingly weak with difficulty to ambulate over the last 1 week.  Patient states he has been chronically having difficulty ambulate with tremors for which patient is on propranolol and primidone and because of the gait difficulty had seen neurologist and was placed on Sinemet.  Patient states since Sinemet was placed patient is feeling more difficulty ambulating last 1 week has found it increasingly difficult and even to walk short distances.  Denies any headache fever chills.  Over the last 10 days patient has been having increasing productive cough.  Also has some difficulty swallowing for the last 1 year.  Has not been evaluated for this.  ED Course: In the ER patient is able to move all extremities 5 x 5 but patient states on walking he finds it difficult to walk because of imbalance and strength.  Has had a fall on September 27 was brought to the ER.  In the ER patient is afebrile labs show sodium 133 creatinine 1 WBC 15.7 platelets 226 hemoglobin 14.3 procalcitonin less than 0.1 EKG shows normal sinus rhythm chest x-ray shows right upper lobe pneumonia.  Patient was started on empiric antibiotics for community-acquired pneumonia admitted for further management.  COVID-19 test is negative.  The patient has been admitted to a medical bed. Neurology has been consulted. They have recommended stopping sinemet.  PT/OT has also evaluated the patient and recommends SNF. Family and patient are resistant.  Consultants  . Neurology  Procedures  . None  Antibiotics   . Antibiotics  Subjective  The patient is resting comfortably. No new complaints.  Objective   Vitals:  Vitals:   08/30/19 0422 08/30/19 1128  BP: (!) 128/53 (!) 130/55  Pulse: 66 65  Resp: 20   Temp: 97.9 F (36.6 C) 98.6 F (37 C)  SpO2: 97% 95%   Exam:  Constitutional:  . The patient is awake, alert, and oriented x 3. No acute distress. Respiratory:  . No increased work of breathing. . No wheezes, rales, or rhonchi . No tactile fremitus Cardiovascular:  . Regular rate and rhythm . No murmurs, ectopy, or gallups. . No lateral PMI. No thrills. Abdomen:  . Abdomen is soft, non-tender, non-distended . No hernias, masses, or organomegaly . Normoactive bowel sounds.  Musculoskeletal:  . No cyanosis, clubbing, or edema Skin:  . No rashes, lesions, ulcers . palpation of skin: no induration or nodules Neurologic:  . CN 2-12 intact . Sensation all 4 extremities intact Psychiatric:  . Mental status o Mood, affect appropriate o Orientation to person, place, time  . judgment and insight appear intact  I have personally reviewed the following:   Today's Data  . Vitals, BMP, CBC, TSH  Micro Data  . Strep pneumoniae urinary antigen.  Imaging  . CXR: Right upper lobe infiltrate. . CT head: Chronic left maxillary sinusitis . CT head: No acute pathology  Cardiology Data  . EKG: NSR  Scheduled Meds: . aspirin EC  81 mg Oral Daily  . atorvastatin  80 mg Oral q1800  . enoxaparin (LOVENOX) injection  40 mg Subcutaneous Q24H  . finasteride  5 mg Oral QHS  . gabapentin  100 mg Oral BID  . levothyroxine  112 mcg Oral QHS  . pantoprazole  40 mg Oral Q lunch  . primidone  250 mg Oral BID WC  . propranolol  20 mg Oral BID  . tamsulosin  0.4 mg Oral QPC supper   Continuous Infusions: . azithromycin 500 mg (08/29/19 2254)  . cefTRIAXone (ROCEPHIN)  IV Stopped (08/29/19 2146)    Principal Problem:   CAP (community acquired pneumonia) Active Problems:    Weakness generalized   Tremor   Tremors of nervous system   LOS: 1 day   A & P  Difficulty ambulating with tremors -was recently placed on Sinemet and patient states symptoms worsened after that.  CT head is unremarkable appears nonfocal. Neurology has been consulted. They have recommended stopping Sinemet.   Pneumonia in the right upper lobe for which patient is on IV Rocephin and azithromycin.Marland Kitchen Speech therapy evaluation in the morning since patient states he also has swallowing difficulties.  COVID-19 test is negative.  Tremors on primidone and propranolol. Worse since started on sinemet. Neurology has recommended stopping the Sinemet.  Essential hypertension: Blood pressures well controlled on propanol.  BPH: Continue tamsulosin as at home.  Peripheral vascular disease: Continue statins as at home.  History of prostate and bladder cancer: Noted.    I have seen and examined this patient myself. I have spent 30 minutes in his evaluation and care.  DVT prophylaxis: Lovenox. Code Status: DNR. Family Communication: We will need to discuss with family. Disposition Plan: To be determined.  Eric Rafuse, DO [facility] Direct contact: see [email or web address]  7PM-7AM contact night coverage as above 08/30/2019, 3:20 PM  LOS: 1 day

## 2019-08-30 NOTE — Progress Notes (Signed)
PROGRESS NOTE  Eric Torres [id number] DOB: 1932/03/22 DOA: 08/28/2019 PCP: Kirk Ruths, MD  Brief History   Eric Torres is a 85 y.o. male with history of essential tremor, hypertension, hyperlipidemia, peripheral vascular disease, hypothyroidism who was recently placed on Sinemet by a neurologist also had an MRI of the brain as outpatient on July 30, 2019 which did not show anything acute has been getting increasingly weak with difficulty to ambulate over the last 1 week.  Patient states he has been chronically having difficulty ambulate with tremors for which patient is on propranolol and primidone and because of the gait difficulty had seen neurologist and was placed on Sinemet.  Patient states since Sinemet was placed patient is feeling more difficulty ambulating last 1 week has found it increasingly difficult and even to walk short distances.  Denies any headache fever chills.  Over the last 10 days patient has been having increasing productive cough.  Also has some difficulty swallowing for the last 1 year.  Has not been evaluated for this.  ED Course: In the ER patient is able to move all extremities 5 x 5 but patient states on walking he finds it difficult to walk because of imbalance and strength.  Has had a fall on September 27 was brought to the ER.  In the ER patient is afebrile labs show sodium 133 creatinine 1 WBC 15.7 platelets 226 hemoglobin 14.3 procalcitonin less than 0.1 EKG shows normal sinus rhythm chest x-ray shows right upper lobe pneumonia.  Patient was started on empiric antibiotics for community-acquired pneumonia admitted for further management.  COVID-19 test is pending.  The patient has been admitted to a medical bed. Neurology has been consulted as has PT/OT.  Consultants  . Neurology  Procedures  . None  Antibiotics  . Antibiotics  Subjective  The patient is resting comfortably. No new complaints.  Objective   Vitals:  Vitals:    08/30/19 0422 08/30/19 1128  BP: (!) 128/53 (!) 130/55  Pulse: 66 65  Resp: 20   Temp: 97.9 F (36.6 C) 98.6 F (37 C)  SpO2: 97% 95%   Exam:  Constitutional:  . The patient is awake, alert, and oriented x 3. No acute distress. Respiratory:  . No increased work of breathing. . No wheezes, rales, or rhonchi . No tactile fremitus Cardiovascular:  . Regular rate and rhythm . No murmurs, ectopy, or gallups. . No lateral PMI. No thrills. Abdomen:  . Abdomen is soft, non-tender, non-distended . No hernias, masses, or organomegaly . Normoactive bowel sounds.  Musculoskeletal:  . No cyanosis, clubbing, or edema Skin:  . No rashes, lesions, ulcers . palpation of skin: no induration or nodules Neurologic:  . CN 2-12 intact . Sensation all 4 extremities intact Psychiatric:  . Mental status o Mood, affect appropriate o Orientation to person, place, time  . judgment and insight appear intact  I have personally reviewed the following:   Today's Data  . Vitals, BMP, CBC, TSH  Micro Data  . Strep pneumoniae urinary antigen.  Imaging  . CXR: Right upper lobe infiltrate. . CT head: Chronic left maxillary sinusitis . CT head: No acute pathology  Cardiology Data  . EKG: NSR  Scheduled Meds: . aspirin EC  81 mg Oral Daily  . atorvastatin  80 mg Oral q1800  . enoxaparin (LOVENOX) injection  40 mg Subcutaneous Q24H  . finasteride  5 mg Oral QHS  . gabapentin  100 mg Oral BID  . levothyroxine  112 mcg Oral QHS  . pantoprazole  40 mg Oral Q lunch  . primidone  250 mg Oral BID WC  . propranolol  20 mg Oral BID  . tamsulosin  0.4 mg Oral QPC supper   Continuous Infusions: . azithromycin 500 mg (08/29/19 2254)  . cefTRIAXone (ROCEPHIN)  IV Stopped (08/29/19 2146)    Principal Problem:   CAP (community acquired pneumonia) Active Problems:   Weakness generalized   Tremor   Tremors of nervous system   LOS: 1 day   A & P  Difficulty ambulating with tremors -was  recently placed on Sinemet and patient states symptoms worsened after that.  CT head is unremarkable appears nonfocal. Neurology has been consulted.  Pneumonia in the right upper lobe for which patient is on IV Rocephin and azithromycin.Marland Kitchen Speech therapy evaluation in the morning since patient states he also has swallowing difficulties.  COVID-19 test is negative.  Tremors on primidone and propranolol. Worse since started on sinemet.  Essential hypertension: Blood pressures well controlled on propanol.  BPH: Continue tamsulosin as at home.  Peripheral vascular disease: Continue statins as at home.  History of prostate and bladder cancer: Noted.    I have seen and examined this patient myself. I have spent 35 minutes in his evaluation and care.  DVT prophylaxis: Lovenox. Code Status: DNR. Family Communication: We will need to discuss with family. Disposition Plan: To be determined.  [name], DO [facility] Direct contact: see [email or web address]  7PM-7AM contact night coverage as above 08/29/2019, 5:45 PM  LOS: 1 day

## 2019-08-31 LAB — BASIC METABOLIC PANEL
Anion gap: 10 (ref 5–15)
BUN: 15 mg/dL (ref 8–23)
CO2: 24 mmol/L (ref 22–32)
Calcium: 7.8 mg/dL — ABNORMAL LOW (ref 8.9–10.3)
Chloride: 101 mmol/L (ref 98–111)
Creatinine, Ser: 1.03 mg/dL (ref 0.61–1.24)
GFR calc Af Amer: >60 mL/min (ref >60)
GFR calc non Af Amer: >60 mL/min (ref >60)
Glucose, Bld: 99 mg/dL (ref 70–99)
Potassium: 3.2 mmol/L — ABNORMAL LOW (ref 3.5–5.1)
Sodium: 135 mmol/L (ref 135–145)

## 2019-08-31 MED ORDER — POTASSIUM CHLORIDE CRYS ER 20 MEQ PO TBCR
40.0000 meq | EXTENDED_RELEASE_TABLET | Freq: Once | ORAL | Status: AC
  Administered 2019-08-31: 40 meq via ORAL
  Filled 2019-08-31: qty 2

## 2019-08-31 NOTE — TOC Initial Note (Signed)
Transition of Care [facility]) - Initial/Assessment Note    Patient Details  Name: Eric Torres MRN: [id number] Date of Birth: 1932/03/06  Transition of Care [facility]) CM/SW Contact:    Marshell Garfinkel, RN Phone Number: 08/31/2019, 3:05 PM  Clinical Narrative:                   Expected Discharge Plan: [place] met with patient and wife to offer transition of care services. He and she have declined SNF. They say he was able to walk to the bathroom today. He is now sitting in recliner. [facility] has offered their services and patient has agreed. He has 2 walkers and "a stick" to walk with at home. He may need a bedside commode however they are concerned with cost as it may not be covered.   Patient Goals and CMS Choice Patient states their goals for this hospitalization and ongoing recovery are:: "to return to home" CMS Medicare.gov Compare Post Acute Care list provided to:: Patient Choice offered to / list presented to : Patient, Spouse  Expected Discharge Plan and Services Expected Discharge Plan: [place] Acute Care Choice: [place] arrangements for the past 2 months: [place]: PT [place] Agency: [facility]) Date [place]: 08/31/19 Time [place]: 22 Representative spoke with at [place]: Drue Novel  Prior Living Arrangements/Services Living arrangements for the past 2 months: [place] with:: Spouse Patient language and need for interpreter reviewed:: Yes Do you feel safe going back to the place where you live?: Yes      Need for Family Participation in Patient Care: Yes (Comment) Care giver support system in place?: Yes (comment) Current home services: DME Criminal Activity/Legal Involvement Pertinent to Current Situation/Hospitalization: No - Comment as needed  Activities of Daily  Living Home Assistive Devices/Equipment: Eyeglasses, Grab bars in shower, Grab bars around toilet, Walker (specify type) ADL Screening (condition at time of admission) Patient's cognitive ability adequate to safely complete daily activities?: Yes Is the patient deaf or have difficulty hearing?: Yes Does the patient have difficulty seeing, even when wearing glasses/contacts?: No Does the patient have difficulty concentrating, remembering, or making decisions?: Yes Patient able to express need for assistance with ADLs?: Yes Does the patient have difficulty dressing or bathing?: No Independently performs ADLs?: Yes (appropriate for developmental age) Does the patient have difficulty walking or climbing stairs?: Yes Weakness of Legs: Both Weakness of Arms/Hands: None  Permission Sought/Granted Permission sought to share information with : Case Manager, Customer service manager Permission granted to share information with : Yes, Verbal Permission Granted  Share Information with NAME: Hanley Seamen     Permission granted to share info w Relationship: daughter     Emotional Assessment Appearance:: Appears stated age Attitude/Demeanor/Rapport: Ambitious Affect (typically observed): Accepting Orientation: : Oriented to Self, Oriented to Place, Oriented to  Time, Oriented to Situation   Psych Involvement: No (comment)  Admission diagnosis:  Confusion [R41.0] Weakness [R53.1] Community acquired pneumonia of right middle lobe of lung [J18.9] Tremors of nervous system [R25.1] Patient Active Problem List   Diagnosis Date Noted  . Tremors of nervous system 08/29/2019  . CAP (community acquired pneumonia) 08/28/2019  .  Weakness generalized 08/28/2019  . Tremor 08/28/2019   PCP:  Kirk Ruths, MD Pharmacy:   [facility] [address] [place] [address] Phone: [phone] Fax: [phone]     Social Determinants of  Health (SDOH) Interventions    Readmission Risk Interventions No flowsheet data found.

## 2019-08-31 NOTE — Progress Notes (Signed)
PROGRESS NOTE  Eric Torres [id number] DOB: [birth year] DOA: 08/28/2019 PCP: Kirk Ruths, MD  Brief History   Eric Torres is a 85 y.o. male with history of essential tremor, hypertension, hyperlipidemia, peripheral vascular disease, hypothyroidism who was recently placed on Sinemet by a neurologist also had an MRI of the brain as outpatient on July 30, 2019 which did not show anything acute has been getting increasingly weak with difficulty to ambulate over the last 1 week.  Patient states he has been chronically having difficulty ambulate with tremors for which patient is on propranolol and primidone and because of the gait difficulty had seen neurologist and was placed on Sinemet.  Patient states since Sinemet was placed patient is feeling more difficulty ambulating last 1 week has found it increasingly difficult and even to walk short distances.  Denies any headache fever chills.  Over the last 10 days patient has been having increasing productive cough.  Also has some difficulty swallowing for the last 1 year.  Has not been evaluated for this.  ED Course: In the ER patient is able to move all extremities 5 x 5 but patient states on walking he finds it difficult to walk because of imbalance and strength.  Has had a fall on September 27 was brought to the ER.  In the ER patient is afebrile labs show sodium 133 creatinine 1 WBC 15.7 platelets 226 hemoglobin 14.3 procalcitonin less than 0.1 EKG shows normal sinus rhythm chest x-ray shows right upper lobe pneumonia.  Patient was started on empiric antibiotics for community-acquired pneumonia admitted for further management.  COVID-19 test is negative.  The patient has been admitted to a medical bed. Neurology has been consulted. They have recommended stopping sinemet.  PT/OT has also evaluated the patient and recommends SNF. Family and patient are resistant.  Consultants  . Neurology  Procedures  . None  Antibiotics   . Antibiotics  Subjective  The patient is resting comfortably. No new complaints.  Objective   Vitals:  Vitals:   08/31/19 0933 08/31/19 1220  BP: (!) 146/67 (!) 115/58  Pulse: 74 63  Resp:    Temp:  98.7 F (37.1 C)  SpO2:  94%   Exam:  Constitutional:  . The patient is awake, alert, and oriented x 3. No acute distress. Respiratory:  . No increased work of breathing. . No wheezes, rales, or rhonchi . No tactile fremitus Cardiovascular:  . Regular rate and rhythm . No murmurs, ectopy, or gallups. . No lateral PMI. No thrills. Abdomen:  . Abdomen is soft, non-tender, non-distended . No hernias, masses, or organomegaly . Normoactive bowel sounds.  Musculoskeletal:  . No cyanosis, clubbing, or edema Skin:  . No rashes, lesions, ulcers . palpation of skin: no induration or nodules Neurologic:  . CN 2-12 intact . Sensation all 4 extremities intact Psychiatric:  . Mental status o Mood, affect appropriate o Orientation to person, place, time  . judgment and insight appear intact  I have personally reviewed the following:   Today's Data  . Vitals, BMP  Micro Data  . Strep pneumoniae urinary antigen negative  Imaging  . CXR: Right upper lobe infiltrate. . CT head: Chronic left maxillary sinusitis . CT head: No acute pathology  Cardiology Data  . EKG: NSR  Scheduled Meds: . aspirin EC  81 mg Oral Daily  . atorvastatin  80 mg Oral q1800  . enoxaparin (LOVENOX) injection  40 mg Subcutaneous Q24H  . finasteride  5 mg Oral  QHS  . gabapentin  100 mg Oral BID  . levothyroxine  112 mcg Oral QHS  . pantoprazole  40 mg Oral Q lunch  . primidone  250 mg Oral BID WC  . propranolol  20 mg Oral BID  . tamsulosin  0.4 mg Oral QPC supper   Continuous Infusions: . sodium chloride 250 mL (08/30/19 2222)  . azithromycin 500 mg (08/30/19 2325)  . cefTRIAXone (ROCEPHIN)  IV 2 g (08/30/19 2223)    Principal Problem:   CAP (community acquired pneumonia) Active  Problems:   Weakness generalized   Tremor   Tremors of nervous system   LOS: 2 days   A & P  Difficulty ambulating with tremors: The patient has had multiple fallls recently. He was recently placed on Sinemet and patient states symptoms worsened after that. CT head is unremarkable appears nonfocal. Neurology has been consulted. They have recommended stopping Sinemet. PT/OT has evaluated the patient and has recommended SNF. Family and patient are resistent.   Pneumonia in the right upper lobe for which patient is on IV Rocephin and azithromycin.Marland Kitchen Speech therapy evaluation in the morning since patient states he also has swallowing difficulties.  COVID-19 test is negative. Lung sounds are improving. The patient is saturating at94% on room air.  Tremors on primidone and propranolol. Worse since started on sinemet. Neurology has recommended stopping the Sinemet.  Essential hypertension: Blood pressures well controlled on propanol.  BPH: Continue tamsulosin as at home.  Peripheral vascular disease: Continue statins as at home.  History of prostate and bladder cancer: Noted.    I have seen and examined this patient myself. I have spent 32 minutes in his evaluation and care.  DVT prophylaxis: Lovenox. Code Status: DNR. Family Communication: We will need to discuss with family. Disposition Plan: To be determined. SNf is recommended, but family is resistant.  Landry Lookingbill, DO [facility] Direct contact: see [email or web address]  7PM-7AM contact night coverage as above 08/31/2019, 12:46 PM  LOS: 1 day

## 2019-09-01 LAB — BASIC METABOLIC PANEL
Anion gap: 11 (ref 5–15)
BUN: 16 mg/dL (ref 8–23)
CO2: 23 mmol/L (ref 22–32)
Calcium: 7.9 mg/dL — ABNORMAL LOW (ref 8.9–10.3)
Chloride: 102 mmol/L (ref 98–111)
Creatinine, Ser: 0.95 mg/dL (ref 0.61–1.24)
GFR calc Af Amer: >60 mL/min (ref >60)
GFR calc non Af Amer: >60 mL/min (ref >60)
Glucose, Bld: 101 mg/dL — ABNORMAL HIGH (ref 70–99)
Potassium: 3.2 mmol/L — ABNORMAL LOW (ref 3.5–5.1)
Sodium: 136 mmol/L (ref 135–145)

## 2019-09-01 MED ORDER — POTASSIUM CHLORIDE CRYS ER 20 MEQ PO TBCR
40.0000 meq | EXTENDED_RELEASE_TABLET | Freq: Once | ORAL | Status: AC
  Administered 2019-09-01: 40 meq via ORAL
  Filled 2019-09-01: qty 2

## 2019-09-01 NOTE — Progress Notes (Signed)
PROGRESS NOTE  Eric Torres [id number] DOB: Jan 24, 1932 DOA: 08/28/2019 PCP: Kirk Ruths, MD  Brief History   Eric Torres is a 85 y.o. male with history of essential tremor, hypertension, hyperlipidemia, peripheral vascular disease, hypothyroidism who was recently placed on Sinemet by a neurologist also had an MRI of the brain as outpatient on July 30, 2019 which did not show anything acute has been getting increasingly weak with difficulty to ambulate over the last 1 week.  Patient states he has been chronically having difficulty ambulate with tremors for which patient is on propranolol and primidone and because of the gait difficulty had seen neurologist and was placed on Sinemet.  Patient states since Sinemet was placed patient is feeling more difficulty ambulating last 1 week has found it increasingly difficult and even to walk short distances.  Denies any headache fever chills.  Over the last 10 days patient has been having increasing productive cough.  Also has some difficulty swallowing for the last 1 year.  Has not been evaluated for this.  ED Course: In the ER patient is able to move all extremities 5 x 5 but patient states on walking he finds it difficult to walk because of imbalance and strength.  Has had a fall on September 27 was brought to the ER.  In the ER patient is afebrile labs show sodium 133 creatinine 1 WBC 15.7 platelets 226 hemoglobin 14.3 procalcitonin less than 0.1 EKG shows normal sinus rhythm chest x-ray shows right upper lobe pneumonia.  Patient was started on empiric antibiotics for community-acquired pneumonia admitted for further management.  COVID-19 test is negative.  The patient has been admitted to a medical bed. Neurology has been consulted. They have recommended stopping sinemet.  PT/OT has also evaluated the patient and recommends SNF. Family and patient are resistant.  Consultants  . Neurology  Procedures  . None  Antibiotics   . Antibiotics  Subjective  The patient is resting comfortably. No new complaints. He states that he is feeling well. Has eaten very little of his breakfast.  Objective   Vitals:  Vitals:   08/31/19 1947 09/01/19 0601  BP: (!) 138/99 (!) 146/63  Pulse: 71 68  Resp: 18 18  Temp: 99.4 F (37.4 C) 98.7 F (37.1 C)  SpO2: 97% 94%   Exam:  Constitutional:  . The patient is awake, alert, and oriented x 3. No acute distress. Respiratory:  . No increased work of breathing. . No wheezes, rales, or rhonchi . No tactile fremitus Cardiovascular:  . Regular rate and rhythm . No murmurs, ectopy, or gallups. . No lateral PMI. No thrills. Abdomen:  . Abdomen is soft, non-tender, non-distended . No hernias, masses, or organomegaly . Normoactive bowel sounds.  Musculoskeletal:  . No cyanosis, clubbing, or edema Skin:  . No rashes, lesions, ulcers . palpation of skin: no induration or nodules Neurologic:  . CN 2-12 intact . Sensation all 4 extremities intact Psychiatric:  . Mental status o Mood, affect appropriate o Orientation to person, place, time  . judgment and insight appear intact  I have personally reviewed the following:   Today's Data  . Vitals, BMP  Micro Data  . Strep pneumoniae urinary antigen negative  Imaging  . CXR: Right upper lobe infiltrate. . CT head: Chronic left maxillary sinusitis . CT head: No acute pathology  Cardiology Data  . EKG: NSR  Scheduled Meds: . aspirin EC  81 mg Oral Daily  . atorvastatin  80 mg Oral q1800  .  enoxaparin (LOVENOX) injection  40 mg Subcutaneous Q24H  . finasteride  5 mg Oral QHS  . gabapentin  100 mg Oral BID  . levothyroxine  112 mcg Oral QHS  . pantoprazole  40 mg Oral Q lunch  . potassium chloride  40 mEq Oral Once  . primidone  250 mg Oral BID WC  . propranolol  20 mg Oral BID  . tamsulosin  0.4 mg Oral QPC supper   Continuous Infusions: . sodium chloride Stopped (08/31/19 0039)  . azithromycin 500 mg  (08/31/19 1949)  . cefTRIAXone (ROCEPHIN)  IV 2 g (08/31/19 1808)    Principal Problem:   CAP (community acquired pneumonia) Active Problems:   Weakness generalized   Tremor   Tremors of nervous system   LOS: 3 days   A & P  Difficulty ambulating with tremors: The patient has had multiple fallls recently. He was recently placed on Sinemet and patient states symptoms worsened after that. CT head is unremarkable appears nonfocal. Neurology has been consulted. They have recommended stopping Sinemet. PT/OT has evaluated the patient and has recommended SNF. Family and patient are resistent.   Pneumonia in the right upper lobe for which patient is on IV Rocephin and azithromycin.Marland Kitchen Speech therapy evaluation in the morning since patient states he also has swallowing difficulties.  COVID-19 test is negative. Lung sounds are improving. The patient is saturating at 94% on room air.  Tremors on primidone and propranolol. Worse since started on sinemet. Neurology has recommended stopping the Sinemet.  Essential hypertension: Blood pressures well controlled on propanol.  BPH: Continue tamsulosin as at home.  Peripheral vascular disease: Continue statins as at home.  History of prostate and bladder cancer: Noted.    I have seen and examined this patient myself. I have spent 30 minutes in his evaluation and care.  DVT prophylaxis: Lovenox. Code Status: DNR. Family Communication: We will need to discuss with family. Disposition Plan: To be determined. SNf is recommended, but family is resistant. Possibly home with home health PT, although patient has not had great success with PT in the past.  Eric Scheiderer, DO [facility] Direct contact: see [email or web address]  7PM-7AM contact night coverage as above 09/01/2019, 12:01 PM  LOS: 1 day

## 2019-09-02 LAB — CULTURE, BLOOD (ROUTINE X 2)
Culture: NO GROWTH
Culture: NO GROWTH

## 2019-09-02 LAB — BASIC METABOLIC PANEL
Anion gap: 8 (ref 5–15)
BUN: 16 mg/dL (ref 8–23)
CO2: 24 mmol/L (ref 22–32)
Calcium: 8.2 mg/dL — ABNORMAL LOW (ref 8.9–10.3)
Chloride: 103 mmol/L (ref 98–111)
Creatinine, Ser: 0.87 mg/dL (ref 0.61–1.24)
GFR calc Af Amer: >60 mL/min (ref >60)
GFR calc non Af Amer: >60 mL/min (ref >60)
Glucose, Bld: 105 mg/dL — ABNORMAL HIGH (ref 70–99)
Potassium: 3.8 mmol/L (ref 3.5–5.1)
Sodium: 135 mmol/L (ref 135–145)

## 2019-09-02 MED ORDER — CEPHALEXIN 500 MG PO CAPS: 500.0000 mg | ORAL_CAPSULE | Freq: Four times a day (QID) | ORAL | 0 refills | Status: AC

## 2019-09-02 NOTE — Care Management Important Message (Signed)
Important Message  Patient Details  Name: Eric Torres MRN: [id number] Date of Birth: [birth year]   Medicare Important Message Given:  Yes     Dannette Barbara 09/02/2019, 10:45 AM

## 2019-09-02 NOTE — TOC Transition Note (Signed)
Transition of Care [facility]) - CM/SW Discharge Note   Patient Details  Name: Eric Torres MRN: [id number] Date of Birth: November 07, 1931  Transition of Care [facility]) CM/SW Contact:  Wende Neighbors, LCSW Phone Number: 09/02/2019, 10:14 AM   Clinical Narrative:   CSW met patient at bedside and spoke to him about discharge plan. Patient stated he is still agreeable to go home with [facility] following. Patient stated his wife and daughter will pick him up at discharge. CSW spoke with Helene Kelp ([facility]) via phone to make her aware of discharge order on patients chart. Helene Kelp aware of patients orders for PT, OT, Social Work and [employer]. CSW signing off as patients needs have been met. Please consult CSW again if any new needs arise            Patient Goals and CMS Choice Patient states their goals for this hospitalization and ongoing recovery are:: "to return to home" CMS Medicare.gov Compare Post Acute Care list provided to:: Patient Choice offered to / list presented to : Patient, Spouse  Discharge Placement                       Discharge Plan and Services     Post Acute Care Choice: [place]: PT [facility] Agency: [facility]) Date [place]: 08/31/19 Time [place]: [phone] Representative spoke with at [place]: [place] (SDOH) Interventions     Readmission Risk Interventions No flowsheet data found.

## 2019-09-02 NOTE — Progress Notes (Signed)
Eric Torres to be D/C'd homes per MD order.  Discussed prescriptions and follow up appointments with the patient. Prescriptions given to patient, medication list explained in detail. Pt verbalized understanding.  Allergies as of 09/02/2019   No Known Allergies     Medication List    STOP taking these medications   carbidopa-levodopa 25-100 MG tablet Commonly known as: SINEMET IR     TAKE these medications   aspirin 81 MG tablet Take 81 mg by mouth daily.   atorvastatin 80 MG tablet Commonly known as: LIPITOR Take 80 mg by mouth daily at 6 PM.   Bladder 2.2 Tabs Take 1 tablet by mouth 2 (two) times daily.   cephALEXin 500 MG capsule Commonly known as: KEFLEX Take 1 capsule (500 mg total) by mouth 4 (four) times daily for 3 days.   finasteride 5 MG tablet Commonly known as: PROSCAR Take 5 mg by mouth at bedtime.   gabapentin 100 MG capsule Commonly known as: NEURONTIN Take 100 mg by mouth 2 (two) times daily.   levothyroxine 112 MCG tablet Commonly known as: SYNTHROID Take 112 mcg by mouth at bedtime.   pantoprazole 40 MG tablet Commonly known as: PROTONIX Take 40 mg by mouth daily with lunch.   primidone 250 MG tablet Commonly known as: MYSOLINE Take 250 mg by mouth 2 (two) times daily with a meal.   propranolol 20 MG tablet Commonly known as: INDERAL Take 20 mg by mouth 2 (two) times daily.   tamsulosin 0.4 MG Caps capsule Commonly known as: FLOMAX Take 0.4 mg by mouth daily after supper.       Vitals:   09/02/19 0943 09/02/19 1134  BP: 139/62 133/64  Pulse: 66 (!) 58  Resp:    Temp:  98.4 F (36.9 C)  SpO2: 93% 94%    Skin clean, dry and intact without evidence of skin break down, no evidence of skin tears noted. IV catheter discontinued intact. Site without signs and symptoms of complications. Dressing and pressure applied. Pt denies pain at this time. No complaints noted.  An After Visit Summary was printed and given to the  patient. Patient escorted via [place], and D/C home via private auto.  Fuller Mandril, RN

## 2019-09-03 NOTE — Discharge Summary (Signed)
Physician Discharge Summary  Eric Torres DOB: 07/29/1932 DOA: 08/28/2019  PCP: Kirk Ruths, MD  Admit date: 08/28/2019 Discharge date: 09/03/2019  Recommendations for Outpatient Follow-up:  1. Follow up with PCP in 7-10 days. 2. Chemistry to be drawn on 09/04/2019 and reported to PCP.  Follow-up Information    Home[facility] Follow up.   Specialty: [place] Why: Please follow up for physical therapy, occupational therapy, aide and social work  Sport and exercise psychologist information: [place] [address] [phone]          Discharge Diagnoses: Principal diagnosis is #1 1. Ambulatory dysfunction 2. Tremors 3. Right upper lobe pneumonia 4. Essential hypertension 5. BPH 6. Peripheral vascular disease 7. History of bladder and prostate cancer  Discharge Condition: Stable  Disposition: Home  Diet recommendation: Heart healthy  Filed Weights   08/30/19 0500  Weight: 79.4 kg    History of present illness:  Eric Torres a 85 y.o.malewithhistory of essential tremor, hypertension, hyperlipidemia, peripheral vascular disease, hypothyroidism who was recently placed on Sinemet by a neurologist also had an MRI of the brain as outpatient on July 30, 2019 which did not show anything acute has been getting increasingly weak with difficulty to ambulate over the last 1 week. Patient states he has been chronically having difficulty ambulate with tremors for which patient is on propranolol and primidone and because of the gait difficulty had seen neurologist and was placed on Sinemet. Patient states since Sinemet was placed patient is feeling more difficulty ambulating last 1 week has found it increasingly difficult and even to walk short distances. Denies any headache fever chills. Over the last 10 days patient has been having increasing productive cough. Also has some difficulty swallowing for the last 1 year. Has not been  evaluated for this.  ED Course:In the ER patient is able to move all extremities 5 x 5 but patient states on walking he finds it difficult to walk because of imbalance and strength. Has had a fall on September 27 was brought to the ER. In the ER patient is afebrile labs show sodium 133 creatinine 1 WBC 15.7 platelets 226 hemoglobin 14.3 procalcitonin less than 0.1 EKG shows normal sinus rhythm chest x-ray shows right upper lobe pneumonia. Patient was started on empiric antibiotics for community-acquired pneumonia admitted for further management. COVID-19 test is negative.  Hospital Course:  The patient has been admitted to a medical bed. Neurology has been consulted. They have recommended stopping sinemet.  PT/OT has also evaluated the patient and recommends SNF. Family and patient are resistant to SNF, but the patient states that he has had PT for 11 of the last 12 months, and that he has only gotten weaker. Family states that he does not continue excercises on days when PT is not there. He will be discharged to home to the care of his family. They are aware that he is likely to continue to get weaker.  Today's assessment: S: The patient states that he is feeling better. No new complaints. O: Vitals:  Vitals:   09/02/19 0943 09/02/19 1134  BP: 139/62 133/64  Pulse: 66 (!) 58  Resp:    Temp:  98.4 F (36.9 C)  SpO2: 93% 94%   Constitutional:   The patient is awake, alert, and oriented x 3. No acute distress. Respiratory:   No increased work of breathing.  No wheezes, rales, or rhonchi  No tactile fremitus Cardiovascular:   Regular rate and  rhythm  No murmurs, ectopy, or gallups.  No lateral PMI. No thrills. Abdomen:   Abdomen is soft, non-tender, non-distended  No hernias, masses, or organomegaly  Normoactive bowel sounds.  Musculoskeletal:   No cyanosis, clubbing, or edema Skin:   No rashes, lesions, ulcers  palpation of skin: no induration or  nodules Neurologic:   CN 2-12 intact  Sensation all 4 extremities intact Psychiatric:   Mental status ? Mood, affect appropriate ? Orientation to person, place, time   judgment and insight appear intact  Discharge Instructions  Discharge Instructions    Activity as tolerated - No restrictions   Complete by: As directed    Call MD for:  persistant nausea and vomiting   Complete by: As directed    Call MD for:  temperature >100.4   Complete by: As directed    Diet general   Complete by: As directed    Discharge instructions   Complete by: As directed    Follow up with PCP in 7-10 days. Chemistry to be drawn on 09/04/2019 and reported to PCP.   Increase activity slowly   Complete by: As directed      Allergies as of 09/02/2019   No Known Allergies     Medication List    STOP taking these medications   carbidopa-levodopa 25-100 MG tablet Commonly known as: SINEMET IR     TAKE these medications   aspirin 81 MG tablet Take 81 mg by mouth daily.   atorvastatin 80 MG tablet Commonly known as: LIPITOR Take 80 mg by mouth daily at 6 PM.   Bladder 2.2 Tabs Take 1 tablet by mouth 2 (two) times daily.   cephALEXin 500 MG capsule Commonly known as: KEFLEX Take 1 capsule (500 mg total) by mouth 4 (four) times daily for 3 days.   finasteride 5 MG tablet Commonly known as: PROSCAR Take 5 mg by mouth at bedtime.   gabapentin 100 MG capsule Commonly known as: NEURONTIN Take 100 mg by mouth 2 (two) times daily.   levothyroxine 112 MCG tablet Commonly known as: SYNTHROID Take 112 mcg by mouth at bedtime.   pantoprazole 40 MG tablet Commonly known as: PROTONIX Take 40 mg by mouth daily with lunch.   primidone 250 MG tablet Commonly known as: MYSOLINE Take 250 mg by mouth 2 (two) times daily with a meal.   propranolol 20 MG tablet Commonly known as: INDERAL Take 20 mg by mouth 2 (two) times daily.   tamsulosin 0.4 MG Caps capsule Commonly known as:  FLOMAX Take 0.4 mg by mouth daily after supper.      No Known Allergies  The results of significant diagnostics from this hospitalization (including imaging, microbiology, ancillary and laboratory) are listed below for reference.    Significant Diagnostic Studies: Dg Chest 2 View  Result Date: 08/28/2019 CLINICAL DATA:  85 year old male with weakness. Evaluate for pneumonia. EXAM: CHEST - 2 VIEW COMPARISON:  Chest CT dated 08/07/2015. FINDINGS: Focal area of opacity in the right upper lobe most consistent with pneumonia. Clinical correlation and follow-up to resolution recommended. The left lung is clear. There is no pleural effusion or pneumothorax. The cardiac silhouette is within normal limits. Atherosclerotic calcification of the aortic arch. No acute osseous pathology. IMPRESSION: Right upper lobe pneumonia. Clinical correlation and follow-up to resolution recommended. Electronically Signed   By: Anner Crete M.D.   On: 08/28/2019 17:25   Ct Head Wo Contrast  Result Date: 08/28/2019 CLINICAL DATA:  Multiple falls, weakness EXAM: CT  HEAD WITHOUT CONTRAST TECHNIQUE: Contiguous axial images were obtained from the base of the skull through the vertex without intravenous contrast. COMPARISON:  07/07/2019 FINDINGS: Brain: No evidence of acute infarction, hemorrhage, hydrocephalus, extra-axial collection or mass lesion/mass effect. There is extensive periventricular and deep white matter hypodensity with dilatation of the lateral and third ventricles ex vacuo. Vascular: No hyperdense vessel or unexpected calcification. Skull: Normal. Negative for fracture or focal lesion. Sinuses/Orbits: No acute finding. Chronic opacification and sinus wall thickening of the left maxillary sinus, status post maxillary antrostomy. Other: None. IMPRESSION: 1.  No acute intracranial pathology. 2. There is extensive small-vessel white matter disease and global volume loss with dilatation of the lateral and third  ventricles ex vacuo. 3.  Chronic left maxillary sinus disease. Electronically Signed   By: Eddie Candle M.D.   On: 08/28/2019 17:16    Microbiology: Recent Results (from the past 240 hour(s))  Blood culture (routine x 2)     Status: None   Collection Time: 08/28/19  7:41 PM   Specimen: BLOOD  Result Value Ref Range Status   Specimen Description BLOOD RIGHT ANTECUBITAL  Final   Special Requests   Final    BOTTLES DRAWN AEROBIC AND ANAEROBIC Blood Culture results may not be optimal due to an inadequate volume of blood received in culture bottles   Culture   Final    NO GROWTH 5 DAYS Performed at [facility], [address], [place], [place] [address]    Report Status 09/02/2019 FINAL  Final  Blood culture (routine x 2)     Status: None   Collection Time: 08/28/19  7:41 PM   Specimen: BLOOD  Result Value Ref Range Status   Specimen Description BLOOD  Final   Special Requests NONE  Final   Culture   Final    NO GROWTH 5 DAYS Performed at [facility], [address], [place], [place] [address]    Report Status 09/02/2019 FINAL  Final  SARS CORONAVIRUS 2 (TAT 6-24 HRS) Nasopharyngeal Nasopharyngeal Swab     Status: None   Collection Time: 08/28/19  7:41 PM   Specimen: Nasopharyngeal Swab  Result Value Ref Range Status   SARS Coronavirus 2 NEGATIVE NEGATIVE Final    Comment: (NOTE) SARS-CoV-2 target nucleic acids are NOT DETECTED. The SARS-CoV-2 RNA is generally detectable in upper and lower respiratory specimens during the acute phase of infection. Negative results do not preclude SARS-CoV-2 infection, do not rule out co-infections with other pathogens, and should not be used as the sole basis for treatment or other patient management decisions. Negative results must be combined with clinical observations, patient history, and epidemiological information. The expected result is Negative. Fact Sheet for  Patients: SugarRoll.be Fact Sheet for Healthcare Providers: https://www.woods-mathews.com/ This test is not yet approved or cleared by the [place] FDA and  has been authorized for detection and/or diagnosis of SARS-CoV-2 by FDA under an Emergency Use Authorization (EUA). This EUA will remain  in effect (meaning this test can be used) for the duration of the COVID-19 declaration under Section 56 4(b)(1) of the Act, 21 U.S.C. section 360bbb-3(b)(1), unless the authorization is terminated or revoked sooner. Performed at [facility], [place] [address], [place], [place] [address]      Labs: Basic Metabolic Panel: Recent Labs  Lab 08/29/19 [id number] 08/30/19 0634 08/31/19 0704 09/01/19 0455 09/02/19 0351  NA 135 137 135 136 135  K 3.4* 3.3* 3.2* 3.2* 3.8  CL 97* 100 101  102 103  CO2 26 25 24 23 24   GLUCOSE 121* 108* 99 101* 105*  BUN 13 14 15 16 16   CREATININE 1.09 1.02 1.03 0.95 0.87  CALCIUM 8.1* 7.8* 7.8* 7.9* 8.2*   Liver Function Tests: No results for input(s): AST, ALT, ALKPHOS, BILITOT, PROT, ALBUMIN in the last 168 hours. No results for input(s): LIPASE, AMYLASE in the last 168 hours. No results for input(s): AMMONIA in the last 168 hours. CBC: Recent Labs  Lab 08/28/19 1332 08/29/19 0632 08/30/19 0634  WBC 15.7* 12.6* 10.9*  HGB 14.3 13.8 12.5*  HCT 39.3 39.5 35.2*  MCV 92.3 94.7 93.9  PLT 226 208 213   Cardiac Enzymes: No results for input(s): CKTOTAL, CKMB, CKMBINDEX, TROPONINI in the last 168 hours. BNP: BNP (last 3 results) No results for input(s): BNP in the last 8760 hours.  ProBNP (last 3 results) No results for input(s): PROBNP in the last 8760 hours.  CBG: No results for input(s): GLUCAP in the last 168 hours.  Principal Problem:   CAP (community acquired pneumonia) Active Problems:   Weakness generalized   Tremor   Tremors of nervous system   Time coordinating discharge: 38  minutes.  Signed:        Iyad Deroo, DO [facility]  09/03/2019, 6:29 PM

## 2020-06-06 ENCOUNTER — Emergency Department: Payer: Medicare Other

## 2020-06-06 ENCOUNTER — Inpatient Hospital Stay
Admission: EM | Admit: 2020-06-06 | Discharge: 2020-06-09 | DRG: 177 | Disposition: A | Discharge status: Home Health | Payer: Medicare Other | Attending: Internal Medicine | Admitting: Internal Medicine

## 2020-06-06 DIAGNOSIS — E7800 Pure hypercholesterolemia, unspecified: Secondary | ICD-10-CM | POA: Diagnosis present

## 2020-06-06 DIAGNOSIS — Z8551 Personal history of malignant neoplasm of bladder: Secondary | ICD-10-CM

## 2020-06-06 DIAGNOSIS — I10 Essential (primary) hypertension: Secondary | ICD-10-CM | POA: Diagnosis present

## 2020-06-06 DIAGNOSIS — E785 Hyperlipidemia, unspecified: Secondary | ICD-10-CM | POA: Diagnosis present

## 2020-06-06 DIAGNOSIS — K219 Gastro-esophageal reflux disease without esophagitis: Secondary | ICD-10-CM | POA: Diagnosis present

## 2020-06-06 DIAGNOSIS — Z8673 Personal history of transient ischemic attack (TIA), and cerebral infarction without residual deficits: Secondary | ICD-10-CM | POA: Diagnosis not present

## 2020-06-06 DIAGNOSIS — R251 Tremor, unspecified: Secondary | ICD-10-CM | POA: Diagnosis not present

## 2020-06-06 DIAGNOSIS — Z66 Do not resuscitate: Secondary | ICD-10-CM | POA: Diagnosis present

## 2020-06-06 DIAGNOSIS — J449 Chronic obstructive pulmonary disease, unspecified: Secondary | ICD-10-CM | POA: Diagnosis present

## 2020-06-06 DIAGNOSIS — G20 Parkinson's disease: Secondary | ICD-10-CM | POA: Diagnosis present

## 2020-06-06 DIAGNOSIS — Z20822 Contact with and (suspected) exposure to covid-19: Secondary | ICD-10-CM | POA: Diagnosis present

## 2020-06-06 DIAGNOSIS — G250 Essential tremor: Secondary | ICD-10-CM | POA: Diagnosis present

## 2020-06-06 DIAGNOSIS — Z9049 Acquired absence of other specified parts of digestive tract: Secondary | ICD-10-CM

## 2020-06-06 DIAGNOSIS — Z7982 Long term (current) use of aspirin: Secondary | ICD-10-CM

## 2020-06-06 DIAGNOSIS — J9601 Acute respiratory failure with hypoxia: Secondary | ICD-10-CM | POA: Diagnosis not present

## 2020-06-06 DIAGNOSIS — Z6829 Body mass index (BMI) 29.0-29.9, adult: Secondary | ICD-10-CM

## 2020-06-06 DIAGNOSIS — Z8546 Personal history of malignant neoplasm of prostate: Secondary | ICD-10-CM | POA: Diagnosis not present

## 2020-06-06 DIAGNOSIS — R001 Bradycardia, unspecified: Secondary | ICD-10-CM | POA: Diagnosis present

## 2020-06-06 DIAGNOSIS — E039 Hypothyroidism, unspecified: Secondary | ICD-10-CM | POA: Diagnosis present

## 2020-06-06 DIAGNOSIS — K2270 Barrett's esophagus without dysplasia: Secondary | ICD-10-CM | POA: Diagnosis present

## 2020-06-06 DIAGNOSIS — I739 Peripheral vascular disease, unspecified: Secondary | ICD-10-CM | POA: Diagnosis present

## 2020-06-06 DIAGNOSIS — K228 Other specified diseases of esophagus: Secondary | ICD-10-CM | POA: Diagnosis present

## 2020-06-06 DIAGNOSIS — Z79899 Other long term (current) drug therapy: Secondary | ICD-10-CM | POA: Diagnosis not present

## 2020-06-06 DIAGNOSIS — E663 Overweight: Secondary | ICD-10-CM | POA: Diagnosis present

## 2020-06-06 DIAGNOSIS — Z87891 Personal history of nicotine dependence: Secondary | ICD-10-CM | POA: Diagnosis not present

## 2020-06-06 DIAGNOSIS — G629 Polyneuropathy, unspecified: Secondary | ICD-10-CM | POA: Diagnosis present

## 2020-06-06 DIAGNOSIS — J690 Pneumonitis due to inhalation of food and vomit: Secondary | ICD-10-CM | POA: Diagnosis not present

## 2020-06-06 DIAGNOSIS — N400 Enlarged prostate without lower urinary tract symptoms: Secondary | ICD-10-CM | POA: Diagnosis present

## 2020-06-06 DIAGNOSIS — I959 Hypotension, unspecified: Secondary | ICD-10-CM | POA: Diagnosis present

## 2020-06-06 DIAGNOSIS — R296 Repeated falls: Secondary | ICD-10-CM | POA: Diagnosis present

## 2020-06-06 DIAGNOSIS — I9589 Other hypotension: Secondary | ICD-10-CM | POA: Diagnosis not present

## 2020-06-06 DIAGNOSIS — R1310 Dysphagia, unspecified: Secondary | ICD-10-CM | POA: Diagnosis not present

## 2020-06-06 DIAGNOSIS — G20A1 Parkinson's disease without dyskinesia, without mention of fluctuations: Secondary | ICD-10-CM | POA: Diagnosis present

## 2020-06-06 LAB — CBC WITH DIFFERENTIAL/PLATELET
Abs Immature Granulocytes: 0.06 10*3/uL (ref 0.00–0.07)
Basophils Absolute: 0.1 10*3/uL (ref 0.0–0.1)
Basophils Relative: 0 %
Eosinophils Absolute: 0.3 10*3/uL (ref 0.0–0.5)
Eosinophils Relative: 3 %
HCT: 49.5 % (ref 39.0–52.0)
Hemoglobin: 17.3 g/dL — ABNORMAL HIGH (ref 13.0–17.0)
Immature Granulocytes: 1 %
Lymphocytes Relative: 27 %
Lymphs Abs: 3.5 10*3/uL (ref 0.7–4.0)
MCH: 33.9 pg (ref 26.0–34.0)
MCHC: 34.9 g/dL (ref 30.0–36.0)
MCV: 96.9 fL (ref 80.0–100.0)
Monocytes Absolute: 0.7 10*3/uL (ref 0.1–1.0)
Monocytes Relative: 5 %
Neutro Abs: 8.3 10*3/uL — ABNORMAL HIGH (ref 1.7–7.7)
Neutrophils Relative %: 64 %
Platelets: 257 10*3/uL (ref 150–400)
RBC: 5.11 MIL/uL (ref 4.22–5.81)
RDW: 12.9 % (ref 11.5–15.5)
WBC: 12.8 10*3/uL — ABNORMAL HIGH (ref 4.0–10.5)
nRBC: 0 % (ref 0.0–0.2)

## 2020-06-06 LAB — COMPREHENSIVE METABOLIC PANEL
ALT: 30 U/L (ref 0–44)
AST: 37 U/L (ref 15–41)
Albumin: 3.2 g/dL — ABNORMAL LOW (ref 3.5–5.0)
Alkaline Phosphatase: 58 U/L (ref 38–126)
Anion gap: 13 (ref 5–15)
BUN: 16 mg/dL (ref 8–23)
CO2: 21 mmol/L — ABNORMAL LOW (ref 22–32)
Calcium: 8.2 mg/dL — ABNORMAL LOW (ref 8.9–10.3)
Chloride: 101 mmol/L (ref 98–111)
Creatinine, Ser: 1.17 mg/dL (ref 0.61–1.24)
GFR calc Af Amer: >60 mL/min (ref >60)
GFR calc non Af Amer: 56 mL/min — ABNORMAL LOW (ref >60)
Glucose, Bld: 152 mg/dL — ABNORMAL HIGH (ref 70–99)
Potassium: 4.1 mmol/L (ref 3.5–5.1)
Sodium: 135 mmol/L (ref 135–145)
Total Bilirubin: 0.8 mg/dL (ref 0.3–1.2)
Total Protein: 6 g/dL — ABNORMAL LOW (ref 6.5–8.1)

## 2020-06-06 LAB — TROPONIN I (HIGH SENSITIVITY)
Troponin I (High Sensitivity): 9 ng/L (ref <18)
Troponin I (High Sensitivity): 40 ng/L — ABNORMAL HIGH (ref <18)

## 2020-06-06 LAB — SARS CORONAVIRUS 2 BY RT PCR (HOSPITAL ORDER, PERFORMED IN ~~LOC~~ HOSPITAL LAB): SARS Coronavirus 2: NEGATIVE

## 2020-06-06 MED ORDER — LEVOTHYROXINE SODIUM 112 MCG PO TABS
112.0000 ug | ORAL_TABLET | Freq: Every day | ORAL | Status: DC
  Administered 2020-06-07 – 2020-06-08 (×2): 112 ug via ORAL
  Filled 2020-06-06 (×5): qty 1

## 2020-06-06 MED ORDER — ENOXAPARIN SODIUM 40 MG/0.4ML ~~LOC~~ SOLN
40.0000 mg | SUBCUTANEOUS | Status: DC
  Administered 2020-06-06 – 2020-06-08 (×3): 40 mg via SUBCUTANEOUS
  Filled 2020-06-06 (×3): qty 0.4

## 2020-06-06 MED ORDER — GABAPENTIN 100 MG PO CAPS
100.0000 mg | ORAL_CAPSULE | Freq: Two times a day (BID) | ORAL | Status: DC
  Administered 2020-06-07 – 2020-06-09 (×4): 100 mg via ORAL
  Filled 2020-06-06 (×5): qty 1

## 2020-06-06 MED ORDER — ATORVASTATIN CALCIUM 20 MG PO TABS
80.0000 mg | ORAL_TABLET | Freq: Every day | ORAL | Status: DC
  Administered 2020-06-08: 80 mg via ORAL
  Filled 2020-06-06: qty 4

## 2020-06-06 MED ORDER — LACTATED RINGERS IV BOLUS
1000.0000 mL | Freq: Once | INTRAVENOUS | Status: AC
  Administered 2020-06-06: 1000 mL via INTRAVENOUS

## 2020-06-06 MED ORDER — ASPIRIN EC 81 MG PO TBEC
81.0000 mg | DELAYED_RELEASE_TABLET | Freq: Every day | ORAL | Status: DC
  Administered 2020-06-08 – 2020-06-09 (×2): 81 mg via ORAL
  Filled 2020-06-06 (×2): qty 1

## 2020-06-06 MED ORDER — PANTOPRAZOLE SODIUM 40 MG PO TBEC
40.0000 mg | DELAYED_RELEASE_TABLET | Freq: Every day | ORAL | Status: DC
  Administered 2020-06-08 – 2020-06-09 (×2): 40 mg via ORAL
  Filled 2020-06-06 (×2): qty 1

## 2020-06-06 MED ORDER — TAMSULOSIN HCL 0.4 MG PO CAPS
0.4000 mg | ORAL_CAPSULE | Freq: Every day | ORAL | Status: DC
  Administered 2020-06-08: 0.4 mg via ORAL
  Filled 2020-06-06: qty 1

## 2020-06-06 MED ORDER — FINASTERIDE 5 MG PO TABS
5.0000 mg | ORAL_TABLET | Freq: Every day | ORAL | Status: DC
  Administered 2020-06-07 – 2020-06-08 (×2): 5 mg via ORAL
  Filled 2020-06-06 (×4): qty 1

## 2020-06-06 MED ORDER — ACETAMINOPHEN 500 MG PO TABS: 500.0000 mg | ORAL_TABLET | Freq: Four times a day (QID) | ORAL | Status: DC | PRN

## 2020-06-06 MED ORDER — SODIUM CHLORIDE 0.9 % IV SOLN
3.0000 g | Freq: Once | INTRAVENOUS | Status: AC
  Administered 2020-06-06: 3 g via INTRAVENOUS
  Filled 2020-06-06: qty 8

## 2020-06-06 MED ORDER — PRIMIDONE 250 MG PO TABS
250.0000 mg | ORAL_TABLET | Freq: Two times a day (BID) | ORAL | Status: DC
  Administered 2020-06-08 – 2020-06-09 (×3): 250 mg via ORAL
  Filled 2020-06-06 (×6): qty 1

## 2020-06-06 MED ORDER — SODIUM CHLORIDE 0.9 % IV SOLN
3.0000 g | Freq: Four times a day (QID) | INTRAVENOUS | Status: DC
  Administered 2020-06-07 – 2020-06-08 (×6): 3 g via INTRAVENOUS
  Filled 2020-06-06 (×4): qty 8
  Filled 2020-06-06: qty 3
  Filled 2020-06-06: qty 8
  Filled 2020-06-06: qty 3
  Filled 2020-06-06: qty 8

## 2020-06-06 NOTE — ED Notes (Addendum)
Pt is on venti at 50% on 12lpm.  EMS stated as per wife, pt choaks on a daily basis.

## 2020-06-06 NOTE — ED Notes (Signed)
Wife at bedside.  Pts bilateral upper extremities noted to be red in color along with chest.

## 2020-06-06 NOTE — ED Notes (Signed)
Date and time results received: 06/06/20 2158 (use smartphrase ".now" to insert current time)  Test: Troponin Critical Value: 40  Name of Provider Notified: Dr. Flossie Buffy  Orders Received? Or Actions Taken?: provider stated she will place order for repeat troponin in 3 hours.

## 2020-06-06 NOTE — ED Notes (Signed)
ER provider stated he took mask off pt and is now of 4lpm via [place]

## 2020-06-06 NOTE — ED Notes (Signed)
Unit secretary paging provider to inform of elevated troponin of 40. Provider also messaged in secure chat.

## 2020-06-06 NOTE — ED Notes (Signed)
As per provider, goal oxygen is above 88%. Provide suction as needed. Pt is on 14lpm at 55% on mask

## 2020-06-06 NOTE — Progress Notes (Signed)
Pharmacy Antibiotic Note  Eric Torres is a 86 y.o. male admitted on 06/06/2020 with aspiration PNA.  Pharmacy has been consulted for Unasyn dosing. CrCl = 48.7 ml/min   Plan: Unasyn 3 gm IV Q6H to start on 8/29 @ 0300.  Height: 5' 7.99" (172.7 cm) Weight: 87.1 kg (192 lb 0.3 oz) IBW/kg (Calculated) : 68.38  Temp (24hrs), Avg:96.5 F (35.8 C), Min:96.5 F (35.8 C), Max:96.5 F (35.8 C)  Recent Labs  Lab 06/06/20 1929  WBC 12.8*  CREATININE 1.17    Estimated Creatinine Clearance: 48.7 mL/min (by C-G formula based on SCr of 1.17 mg/dL).    No Known Allergies  Antimicrobials this admission:   >>    >>   Dose adjustments this admission:   Microbiology results:  BCx:   UCx:    Sputum:    MRSA PCR:   Thank you for allowing pharmacy to be a part of this patient's care.  Rohail Klees D 06/06/2020 9:51 PM

## 2020-06-06 NOTE — ED Notes (Signed)
Pt venti upped to 55%. ER provider at bedside and is suctioning pt.

## 2020-06-06 NOTE — ED Triage Notes (Signed)
Pt came in via emergency traffic after choking on spaghetti and went unresponsive, blue and cyanotic. Pt was 70s%. EMS suctioned and got meat and spaghetti out. ER provider providing suction in room.  Wife states pt choaks every night at home.

## 2020-06-06 NOTE — ED Notes (Signed)
pts wife called and she stated she is here and going to come in. X-ray at bedside. Pt has been suctioned several more times

## 2020-06-06 NOTE — ED Notes (Signed)
Pt on 4LPM via [place] at 87%. Pt is laying in bed

## 2020-06-06 NOTE — H&P (Signed)
History and Physical    Eric Torres YQI:[id number] DOB: [birth year] DOA: 06/06/2020  PCP: Kirk Ruths, MD  Patient coming from: Home  I have personally briefly reviewed patient's old medical records in [place]  Chief Complaint: choking and unresponsivness  HPI: Eric Torres is a 86 y.o. male with medical history significant for Parkinsonism with recent dysphagia, essential tremor, hypertension, PVD, hypothyroidism who presented with hypoxia following a choking episode.  Patient only remembers eating about 3 bites of his spaghetti tonight when wife noticed he started choking and slumped over in his chair. He was noted to be unresponsive and cyanotic with oxygen saturation of 70% on EMS arrival.  He was able to regain consciousness after suction of food particle.  On arrival to the ED was initially hypotensive with systolic of 38V/56E and had hypoxia of 86% and was placed on 6 L via nasal cannula.  Lab work notable for mild leukocytosis of 12.8 and chest x-ray showing likely aspiration pneumonia.  He has been having chronic dysphagia for at least a year.  He has seen neurology who thinks that it is secondary to his possible parkinsonism and recently had his ropinirole increased.  He has seen gastroenterology for barium swallow back in 06/2019 and was diagnosed with Barrett's and presbyesophagus.  He was advised to take pantoprazole and started on mechanical soft diet.     Review of Systems:  Constitutional: No Weight Change, No Fever ENT/Mouth: No sore throat, No Rhinorrhea Eyes: No Eye Pain, No Vision Changes Cardiovascular: No Chest Pain, no SOB Respiratory: No Cough, No Sputum, No Wheezing, no Dyspnea  Gastrointestinal: No Nausea, No Vomiting, , No Pain Genitourinary: no Urinary Incontinence, Musculoskeletal: No Arthralgias, No Myalgias Skin: No Skin Lesions, No Pruritus, Neuro: no Weakness, No Numbness,  + Loss of Consciousness, No Syncope Psych: No  Anxiety/Panic, No Depression, no decrease appetite Heme/Lymph: No Bruising, No Bleeding  Past Medical History:  Diagnosis Date  . Bilateral foot pain    LIKELY NEUROPATHY  . BPH (benign prostatic hyperplasia)   . Cancer (HCC)    ,HX OF PROSTATE CANCER AND BLADDER CANCER  . Collagenous colitis   . COPD (chronic obstructive pulmonary disease) (HCC)    MILD-NO INHALERS PER PT  . Essential tremor   . Frequent falls   . Heart murmur   . History of brachytherapy   . History of TIA (transient ischemic attack) 2015   LEFT THUMB AND INDEX FINGER-SOMETIMES I DROP THINGS  . Hypercholesteremia   . Hypertension   . Hypothyroidism   . PVD (peripheral vascular disease) ([place])   . Stroke ([place])   . Vitamin D deficiency     Past Surgical History:  Procedure Laterality Date  . CHOLECYSTECTOMY    . CYSTOSCOPY WITH BIOPSY N/A 12/29/2015   Procedure: CYSTOSCOPY WITH BIOPSY;  Surgeon: Royston Cowper, MD;  Location: [facility] ORS;  Service: Urology;  Laterality: N/A;  . DIAGNOSTIC LAPAROSCOPY    . ESOPHAGOGASTRODUODENOSCOPY (EGD) WITH PROPOFOL N/A 02/15/2019   Procedure: ESOPHAGOGASTRODUODENOSCOPY (EGD) WITH PROPOFOL;  Surgeon: Lollie Sails, MD;  Location: [facility] ENDOSCOPY;  Service: Endoscopy;  Laterality: N/A;  . EXPLORATORY LAPAROTOMY  1991   VOLVULUS AND BOWEL OBSTRUCTION  . HERNIA REPAIR    . ILIAC ARTERY STENT Bilateral   . TONSILLECTOMY    . TRANSURETHRAL RESECTION OF BLADDER TUMOR N/A 12/29/2015   Procedure: TRANSURETHRAL RESECTION OF BLADDER TUMOR (TURBT)/MITOMYCIN INSTILLATION;  Surgeon: Royston Cowper, MD;  Location: [facility] ORS;  Service: Urology;  Laterality: N/A;  . TRANSURETHRAL RESECTION OF BLADDER TUMOR WITH GYRUS (TURBT-GYRUS)  2013  . VASECTOMY       reports that he quit smoking about 8 years ago. His smoking use included cigarettes. He has a 17.50 pack-year smoking history. He has never used smokeless tobacco. He reports current alcohol use. He reports that he does not use  drugs. Social History  No Known Allergies  Family History  Family history unknown: Yes     Prior to Admission medications   Medication Sig Start Date End Date Taking? Authorizing Provider  aspirin 81 MG tablet Take 81 mg by mouth daily.    [provider]  atorvastatin (LIPITOR) 80 MG tablet Take 80 mg by mouth daily at 6 PM.    [provider]  finasteride (PROSCAR) 5 MG tablet Take 5 mg by mouth at bedtime.    [provider]  gabapentin (NEURONTIN) 100 MG capsule Take 100 mg by mouth 2 (two) times daily.    [provider]  levothyroxine (SYNTHROID, LEVOTHROID) 112 MCG tablet Take 112 mcg by mouth at bedtime.     [provider]  Nutritional Supplements (BLADDER 2.2) TABS Take 1 tablet by mouth 2 (two) times daily.     [provider]  pantoprazole (PROTONIX) 40 MG tablet Take 40 mg by mouth daily with lunch.     [provider]  primidone (MYSOLINE) 250 MG tablet Take 250 mg by mouth 2 (two) times daily with a meal.    [provider]  propranolol (INDERAL) 20 MG tablet Take 20 mg by mouth 2 (two) times daily.     [provider]  tamsulosin (FLOMAX) 0.4 MG CAPS capsule Take 0.4 mg by mouth daily after supper.    [provider]    Physical Exam: Vitals:   06/06/20 2030 06/06/20 2040 06/06/20 2100 06/06/20 2112  BP: 109/60 (!) 123/57 105/73   Pulse: (!) 57 60 61   Resp:  15 13   Temp:    (!) 96.5 F (35.8 C)  TempSrc:    Rectal  SpO2: 99% 100% 100%     Constitutional: NAD, calm, comfortable, elderly gentleman laying at 20 degree incline in bed Vitals:   06/06/20 2030 06/06/20 2040 06/06/20 2100 06/06/20 2112  BP: 109/60 (!) 123/57 105/73   Pulse: (!) 57 60 61   Resp:  15 13   Temp:    (!) 96.5 F (35.8 C)  TempSrc:    Rectal  SpO2: 99% 100% 100%    Eyes: PERRL, lids and conjunctivae normal ENMT: Mucous membranes are moist.  Neck: normal, supple, no masses, no  thyromegaly Respiratory: clear to auscultation bilaterally, no wheezing, no crackles. Normal respiratory effort 4 L of O2 via nasal cannula. No accessory muscle use.  Cardiovascular: Regular rate and rhythm, no murmurs / rubs / gallops. No extremity edema.   Abdomen: no tenderness, no masses palpated.  Bowel sounds positive.  Musculoskeletal: no clubbing / cyanosis. No joint deformity upper and lower extremities. Good ROM, no contractures. Normal muscle tone.  Skin: no rashes, lesions, ulcers. No induration Neurologic: CN 2-12 grossly intact. Sensation intact. Strength 5/5 in all 4.  Psychiatric: Normal judgment and insight. Alert and oriented x 3. Normal mood.     Labs on Admission: I have personally reviewed following labs and imaging studies  CBC: Recent Labs  Lab 06/06/20 1929  WBC 12.8*  NEUTROABS 8.3*  HGB 17.3*  HCT 49.5  MCV 96.9  PLT 257  Basic Metabolic Panel: Recent Labs  Lab 06/06/20 1929  NA 135  K 4.1  CL 101  CO2 21*  GLUCOSE 152*  BUN 16  CREATININE 1.17  CALCIUM 8.2*   GFR: CrCl cannot be calculated (Unknown ideal weight.). Liver Function Tests: Recent Labs  Lab 06/06/20 1929  AST 37  ALT 30  ALKPHOS 58  BILITOT 0.8  PROT 6.0*  ALBUMIN 3.2*   No results for input(s): LIPASE, AMYLASE in the last 168 hours. No results for input(s): AMMONIA in the last 168 hours. Coagulation Profile: No results for input(s): INR, PROTIME in the last 168 hours. Cardiac Enzymes: No results for input(s): CKTOTAL, CKMB, CKMBINDEX, TROPONINI in the last 168 hours. BNP (last 3 results) No results for input(s): PROBNP in the last 8760 hours. HbA1C: No results for input(s): HGBA1C in the last 72 hours. CBG: No results for input(s): GLUCAP in the last 168 hours. Lipid Profile: No results for input(s): CHOL, HDL, LDLCALC, TRIG, CHOLHDL, LDLDIRECT in the last 72 hours. Thyroid Function Tests: No results for input(s): TSH, T4TOTAL, FREET4, T3FREE, THYROIDAB in the  last 72 hours. Anemia Panel: No results for input(s): VITAMINB12, FOLATE, FERRITIN, TIBC, IRON, RETICCTPCT in the last 72 hours. Urine analysis: No results found for: COLORURINE, APPEARANCEUR, LABSPEC, [place], GLUCOSEU, [place], BILIRUBINUR, KETONESUR, PROTEINUR, UROBILINOGEN, NITRITE, LEUKOCYTESUR  Radiological Exams on Admission: DG Chest Portable 1 View  Result Date: 06/06/2020 CLINICAL DATA:  Aspiration EXAM: PORTABLE CHEST 1 VIEW COMPARISON:  Chest radiograph dated 08/28/2019 FINDINGS: The heart size and mediastinal contours are within normal limits. There is questionable mild left infrahilar airspace opacity. The right lung is clear. There is no pleural effusion or pneumothorax. The visualized skeletal structures are unremarkable. IMPRESSION: Questionable mild left infrahilar airspace opacity could represent early pneumonia. Electronically Signed   By: Zerita Boers M.D.   On: 06/06/2020 19:58      Assessment/Plan  Acute hypoxic respiratory failure secondary to aspiration pneumonia following choking event Continue IV Unasyn Maintain O2 saturation greater than 92% Continuous pulse oximetry Incentive spirometry/flutter valve  Chronic Dysphagia Being followed and treated by neurology outpatient and thinks likely secondary to parkinsonism Keep n.p.o. except for meds for now Have speech therapy evaluation in the morning to adjust diet Continue PPI  Elevated troponin 9-->40. No EKG changes. Likely from demand ischemia from initial hypotension Follow one more and will d/c if trend down  Hypotension Patient initially hypotensive on arrival likely due to acute hypoxic respiratory failure Improved after fluids in the ED.  Hold all antihypertensives for now.  Essential tremor Hold propanolol briefly overnight for hypotension and mild bradycardia  History of CVA Continue aspirin   DVT prophylaxis:.Lovenox Code Status: Full Family Communication: Plan discussed with patient at  bedside  disposition Plan: Home with at least 2 midnight stays  Consults called:  Admission status: inpatient    Status is: Inpatient  Remains inpatient appropriate because:Inpatient level of care appropriate due to severity of illness   Dispo: The patient is from: Home              Anticipated d/c is to: Home              Anticipated d/c date is: 3 days              Patient currently is not medically stable to d/c.         Orene Desanctis DO [facility]   If 7PM-7AM, please contact night-coverage [email or web address]   06/06/2020, 9:27 PM

## 2020-06-06 NOTE — ED Notes (Signed)
Provider messaged to inform of BP of 131/51

## 2020-06-06 NOTE — ED Provider Notes (Signed)
[facility] Emergency Department Provider Note   ____________________________________________   First MD Initiated Contact with Patient 06/06/20 1934     (approximate)  I have reviewed the triage vital signs and the nursing notes.   HISTORY  Chief Complaint Airway Obstruction    HPI Eric Torres is a 86 y.o. male with past medical history of hypertension, hyperlipidemia, peripheral vascular disease and tremor who presents to the ED for aspiration.  History is limited due to patient's altered mental status.  Per EMS, patient was eating spaghetti for dinner and seemed to start choking.  His wife told EMS that he will frequently choke with dinner, but tonight's episode was more prolonged.  He eventually became unresponsive and appeared cyanotic, initial O2 sat was 70% on room air.  EMS was able to suction out a significant amount of spaghetti during transport, after which patient became more responsive and awoke.  On arrival, patient is somnolent but arousable to voice, denies any complaints.        Past Medical History:  Diagnosis Date  . Bilateral foot pain    LIKELY NEUROPATHY  . BPH (benign prostatic hyperplasia)   . Cancer (HCC)    ,HX OF PROSTATE CANCER AND BLADDER CANCER  . Collagenous colitis   . COPD (chronic obstructive pulmonary disease) (HCC)    MILD-NO INHALERS PER PT  . Essential tremor   . Frequent falls   . Heart murmur   . History of brachytherapy   . History of TIA (transient ischemic attack) 2015   LEFT THUMB AND INDEX FINGER-SOMETIMES I DROP THINGS  . Hypercholesteremia   . Hypertension   . Hypothyroidism   . PVD (peripheral vascular disease) ([place])   . Stroke ([place])   . Vitamin D deficiency     Patient Active Problem List   Diagnosis Date Noted  . Aspiration pneumonia ([place]) 06/06/2020  . Acute respiratory failure with hypoxia ([place]) 06/06/2020  . HTN (hypertension) 06/06/2020  . History of CVA (cerebrovascular accident)  06/06/2020  . Tremors of nervous system 08/29/2019  . CAP (community acquired pneumonia) 08/28/2019  . Weakness generalized 08/28/2019  . Tremor 08/28/2019    Past Surgical History:  Procedure Laterality Date  . CHOLECYSTECTOMY    . CYSTOSCOPY WITH BIOPSY N/A 12/29/2015   Procedure: CYSTOSCOPY WITH BIOPSY;  Surgeon: Royston Cowper, MD;  Location: [facility] ORS;  Service: Urology;  Laterality: N/A;  . DIAGNOSTIC LAPAROSCOPY    . ESOPHAGOGASTRODUODENOSCOPY (EGD) WITH PROPOFOL N/A 02/15/2019   Procedure: ESOPHAGOGASTRODUODENOSCOPY (EGD) WITH PROPOFOL;  Surgeon: Lollie Sails, MD;  [facility]: [facility] ENDOSCOPY;  Service: Endoscopy;  Laterality: N/A;  . EXPLORATORY LAPAROTOMY  1991   VOLVULUS AND BOWEL OBSTRUCTION  . HERNIA REPAIR    . ILIAC ARTERY STENT Bilateral   . TONSILLECTOMY    . TRANSURETHRAL RESECTION OF BLADDER TUMOR N/A 12/29/2015   Procedure: TRANSURETHRAL RESECTION OF BLADDER TUMOR (TURBT)/MITOMYCIN INSTILLATION;  Surgeon: Royston Cowper, MD;  Location: [facility] ORS;  Service: Urology;  Laterality: N/A;  . TRANSURETHRAL RESECTION OF BLADDER TUMOR WITH GYRUS (TURBT-GYRUS)  2013  . VASECTOMY      Prior to Admission medications   Medication Sig Start Date End Date Taking? Authorizing Provider  rOPINIRole (REQUIP) 0.25 MG tablet Take 2 tablets by mouth in the morning, at noon, and at bedtime. 05/14/20 06/13/20 Yes [provider]  aspirin 81 MG tablet Take 81 mg by mouth daily.    [provider]  atorvastatin (LIPITOR) 80 MG tablet Take  80 mg by mouth daily at 6 PM.    [provider]  finasteride (PROSCAR) 5 MG tablet Take 5 mg by mouth at bedtime.    [provider]  gabapentin (NEURONTIN) 100 MG capsule Take 100 mg by mouth 2 (two) times daily.    [provider]  levothyroxine (SYNTHROID, LEVOTHROID) 112 MCG tablet Take 112 mcg by mouth at bedtime.     [provider]  Nutritional Supplements (BLADDER 2.2) TABS Take 1 tablet by mouth  2 (two) times daily.     [provider]  pantoprazole (PROTONIX) 40 MG tablet Take 40 mg by mouth daily with lunch.     [provider]  primidone (MYSOLINE) 250 MG tablet Take 250 mg by mouth 2 (two) times daily with a meal.    [provider]  propranolol (INDERAL) 20 MG tablet Take 20 mg by mouth 2 (two) times daily.     [provider]  tamsulosin (FLOMAX) 0.4 MG CAPS capsule Take 0.4 mg by mouth daily after supper.    [provider]    Allergies Patient has no known allergies.  Family History  Family history unknown: Yes    Social History Social History   Tobacco Use  . Smoking status: Former Smoker    Packs/day: 0.50    Years: 35.00    Pack years: 17.50    Types: Cigarettes    Quit date: 12/23/2011    Years since quitting: 8.4  . Smokeless tobacco: Never Used  Vaping Use  . Vaping Use: Never used  Substance Use Topics  . Alcohol use: Yes    Comment: OCC  . Drug use: No    Review of Systems Unable to obtain secondary to altered mental status. ____________________________________________   PHYSICAL EXAM:  VITAL SIGNS: ED Triage Vitals  Enc Vitals Group     BP 06/06/20 1931 (!) 81/52     Pulse Rate 06/06/20 1931 78     Resp 06/06/20 1931 (!) 21     Temp --      Temp src --      SpO2 06/06/20 1931 (!) 86 %     Weight --      Height --      Head Circumference --      Peak Flow --      Pain Score 06/06/20 1931 0     Pain Loc --      Pain Edu? --      Excl. in [place]? --     Constitutional: Somnolent but arousable to voice. Eyes: Conjunctivae are normal. Head: Atraumatic. Nose: No congestion/rhinnorhea. Mouth/Throat: Mucous membranes are moist.  Small amount of food particles in mouth. Neck: Normal ROM Cardiovascular: Normal rate, regular rhythm. Grossly normal heart sounds. Respiratory: Normal respiratory effort.  No retractions. Lungs with diffuse crackles. Gastrointestinal: Soft and nontender. No  distention. Genitourinary: deferred Musculoskeletal: No lower extremity tenderness nor edema. Neurologic:  Normal speech and language. No gross focal neurologic deficits are appreciated. Skin:  Skin is warm, dry and intact. No rash noted. Psychiatric: Mood and affect are normal. Speech and behavior are normal.  ____________________________________________   LABS (all labs ordered are listed, but only abnormal results are displayed)  Labs Reviewed  COMPREHENSIVE METABOLIC PANEL - Abnormal; Notable for the following components:      Result Value   CO2 21 (*)    Glucose, Bld 152 (*)    Calcium 8.2 (*)    Total Protein 6.0 (*)  Albumin 3.2 (*)    GFR calc non Af Amer 56 (*)    All other components within normal limits  CBC WITH DIFFERENTIAL/PLATELET - Abnormal; Notable for the following components:   WBC 12.8 (*)    Hemoglobin 17.3 (*)    Neutro Abs 8.3 (*)    All other components within normal limits  TROPONIN I (HIGH SENSITIVITY) - Abnormal; Notable for the following components:   Troponin I (High Sensitivity) 40 (*)    All other components within normal limits  SARS CORONAVIRUS 2 BY RT PCR (HOSPITAL ORDER, [place] [facility])  HIV ANTIBODY (ROUTINE TESTING W REFLEX)  TROPONIN I (HIGH SENSITIVITY)   ____________________________________________  EKG  ED ECG REPORT I, Blake Divine, the attending physician, personally viewed and interpreted this ECG.   Date: 06/06/2020  EKG Time: 21:24  Rate: 61  Rhythm: normal sinus rhythm  Axis: Normal  Intervals:none  ST&T Change: None   PROCEDURES  Procedure(s) performed (including Critical Care):  .Critical Care Performed by: Blake Divine, MD Authorized by: Blake Divine, MD   Critical care provider statement:    Critical care time (minutes):  45   Critical care time was exclusive of:  Separately billable procedures and treating other patients and teaching time   Critical care was necessary to  treat or prevent imminent or life-threatening deterioration of the following conditions:  Respiratory failure   Critical care was time spent personally by me on the following activities:  Discussions with consultants, evaluation of patient's response to treatment, examination of patient, ordering and performing treatments and interventions, ordering and review of laboratory studies, ordering and review of radiographic studies, pulse oximetry, re-evaluation of patient's condition, obtaining history from patient or surrogate and review of old charts   I assumed direction of critical care for this patient from another provider in my specialty: no       ____________________________________________   INITIAL IMPRESSION / ASSESSMENT AND PLAN / ED COURSE       86 year old male with possible history of hypertension, hyperlipidemia, peripheral vascular disease and tremor who presents to the ED after apparent aspiration event at home.  Wife had reported that he chokes almost every night with dinner, he was also recently evaluated by neurology for dysphagia, thought potentially related to Parkinson disease.  EMS found patient unresponsive with O2 sats in the 70s on room air, but he seems to have improved after suctioning of a significant amount of spaghetti from his throat.  He is arousable to voice on arrival and able to answer questions with one-word answers.  Additional spit and small amount of food particles were suctioned from his oropharynx.  I suspect significant aspiration event as his O2 sats were in the low 80s on room air, did not improve with 6 L nasal cannula but are now improving to 92% on Venturi mask.  Blood pressure also borderline low and we will hydrate with IV fluids.  EKG, chest x-ray, and lab work are pending.  Lab work reassuring, blood pressure improved following IV fluid bolus.  Patient has been increasingly alert during his ED stay, is now fully awake and answering questions  appropriately.  We will able to wean him off of the Venturi mask and onto 4 L nasal cannula, O2 sats remained stable at this time.  Chest x-ray does show evidence of aspiration and we will cover with IV Unasyn.  Case discussed with hospitalist for admission.      ____________________________________________   FINAL CLINICAL  IMPRESSION(S) / ED DIAGNOSES  Final diagnoses:  Aspiration pneumonia, unspecified aspiration pneumonia type, unspecified laterality, unspecified part of lung [facility])     ED Discharge Orders    None       Note:  This document was prepared using Dragon voice recognition software and may include unintentional dictation errors.   Blake Divine, MD 06/06/20 [phone]

## 2020-06-07 ENCOUNTER — Other Ambulatory Visit: Payer: Self-pay

## 2020-06-07 ENCOUNTER — Encounter: Payer: Self-pay | Admitting: Family Medicine

## 2020-06-07 DIAGNOSIS — G20A1 Parkinson's disease without dyskinesia, without mention of fluctuations: Secondary | ICD-10-CM | POA: Diagnosis present

## 2020-06-07 DIAGNOSIS — I10 Essential (primary) hypertension: Secondary | ICD-10-CM

## 2020-06-07 DIAGNOSIS — G20 Parkinson's disease: Secondary | ICD-10-CM | POA: Diagnosis present

## 2020-06-07 DIAGNOSIS — R1310 Dysphagia, unspecified: Secondary | ICD-10-CM

## 2020-06-07 LAB — CBC
HCT: 39.1 % (ref 39.0–52.0)
Hemoglobin: 14 g/dL (ref 13.0–17.0)
MCH: 34.3 pg — ABNORMAL HIGH (ref 26.0–34.0)
MCHC: 35.8 g/dL (ref 30.0–36.0)
MCV: 95.8 fL (ref 80.0–100.0)
Platelets: 170 10*3/uL (ref 150–400)
RBC: 4.08 MIL/uL — ABNORMAL LOW (ref 4.22–5.81)
RDW: 13 % (ref 11.5–15.5)
WBC: 13.7 10*3/uL — ABNORMAL HIGH (ref 4.0–10.5)
nRBC: 0 % (ref 0.0–0.2)

## 2020-06-07 LAB — GLUCOSE, CAPILLARY: Glucose-Capillary: 76 mg/dL (ref 70–99)

## 2020-06-07 LAB — TROPONIN I (HIGH SENSITIVITY)
Troponin I (High Sensitivity): 59 ng/L — ABNORMAL HIGH (ref <18)
Troponin I (High Sensitivity): 88 ng/L — ABNORMAL HIGH (ref <18)
Troponin I (High Sensitivity): 97 ng/L — ABNORMAL HIGH (ref <18)

## 2020-06-07 LAB — HIV ANTIBODY (ROUTINE TESTING W REFLEX): HIV Screen 4th Generation wRfx: NONREACTIVE

## 2020-06-07 MED ORDER — SODIUM CHLORIDE 0.9 % IV SOLN
INTRAVENOUS | Status: DC | PRN
  Administered 2020-06-07: 250 mL via INTRAVENOUS

## 2020-06-07 NOTE — ED Notes (Signed)
Dr. Flossie Buffy aware of troponin of 97 and stated she will continue to monitor it and trend it.

## 2020-06-07 NOTE — Progress Notes (Signed)
PROGRESS NOTE  Eric Torres AST:[id number] DOB: 06/15/1932 DOA: 06/06/2020 PCP: Kirk Ruths, MD  HPI/Recap of past 53 hours: 86 year old male with past medical history of Parkinson's disease, CVA and chronic dysphagia felt to be secondary to his Parkinson's who had an episode on 8/28 where apparently he choked and then became unresponsive and fell over.  Wife could not resuscitate him.  EMS was called and patient was noted to be hypotensive although it did not look like he had stopped breathing or heart had stopped during this event.  He was noted to be hypoxic and so was brought in.  Found to have evidence of aspiration pneumonia although no evidence of sepsis.  Started on antibiotics.  By time he was in the emergency room, much more awake and alert and oriented.  Admitted to the hospitalist service.  This morning, seen in the emergency room.  Feeling a little bit better, breathing a little bit easier.  Awaiting speech therapy for follow-up evaluation.  Assessment/Plan: Principal Problem:   Acute respiratory failure with hypoxia (HCC) secondary to aspiration pneumonia secondary to dysphagia from Parkinson's, slight dysphagia may be worsening.  Checking swallow eval.  Continue IV antibiotics.  Continue supplemental oxygen.  Hopefully can wean off soon. Active Problems:   Tremor    HTN (hypertension): Initially hypotensive due to episode of unresponsiveness.  Monitor blood pressure closely.   History of CVA (cerebrovascular accident)  Overweight: Patient is criteria BMI greater than 25     Code Status: DNR  Family Communication: Wife at the bedside  Disposition Plan: Potential discharge as early as tomorrow once swallowing formally evaluated and oxygen able to be weaned down   Consultants:  None  Procedures:  None  Antimicrobials:  IV Unasyn 8/28-present  DVT prophylaxis: Lovenox   Objective: Vitals:   06/07/20 1630 06/07/20 1749  BP: 138/62 (!) 156/75   Pulse: 65 69  Resp: 16 16  Temp:  98.1 F (36.7 C)  SpO2: 98% 100%   No intake or output data in the 24 hours ending 06/07/20 1754 Filed Weights   06/06/20 2120  Weight: 87.1 kg   Body mass index is 29.2 kg/m.  Exam:   General: Alert and oriented x3, no acute distress  HEENT: Cephalic atraumatic, mucous membranes are slightly dry  Cardiovascular: Regular rate and rhythm, S1-S2  Respiratory: Decreased breath sounds bibasilar  Abdomen: Soft, nontender, nondistended, positive bowel sounds  Musculoskeletal: No clubbing or cyanosis, trace pitting edema  Skin: No skin breaks, tears or lesions  Psychiatry: Appropriate, no evidence of psychoses   Data Reviewed: CBC: Recent Labs  Lab 06/06/20 1929 06/07/20 0935  WBC 12.8* 13.7*  NEUTROABS 8.3*  --   HGB 17.3* 14.0  HCT 49.5 39.1  MCV 96.9 95.8  PLT 257 989   Basic Metabolic Panel: Recent Labs  Lab 06/06/20 1929  NA 135  K 4.1  CL 101  CO2 21*  GLUCOSE 152*  BUN 16  CREATININE 1.17  CALCIUM 8.2*   GFR: Estimated Creatinine Clearance: 48.7 mL/min (by C-G formula based on SCr of 1.17 mg/dL). Liver Function Tests: Recent Labs  Lab 06/06/20 1929  AST 37  ALT 30  ALKPHOS 58  BILITOT 0.8  PROT 6.0*  ALBUMIN 3.2*   No results for input(s): LIPASE, AMYLASE in the last 168 hours. No results for input(s): AMMONIA in the last 168 hours. Coagulation Profile: No results for input(s): INR, PROTIME in the last 168 hours. Cardiac Enzymes: No results for input(s): CKTOTAL,  CKMB, CKMBINDEX, TROPONINI in the last 168 hours. BNP (last 3 results) No results for input(s): PROBNP in the last 8760 hours. HbA1C: No results for input(s): HGBA1C in the last 72 hours. CBG: No results for input(s): GLUCAP in the last 168 hours. Lipid Profile: No results for input(s): CHOL, HDL, LDLCALC, TRIG, CHOLHDL, LDLDIRECT in the last 72 hours. Thyroid Function Tests: No results for input(s): TSH, T4TOTAL, FREET4, T3FREE,  THYROIDAB in the last 72 hours. Anemia Panel: No results for input(s): VITAMINB12, FOLATE, FERRITIN, TIBC, IRON, RETICCTPCT in the last 72 hours. Urine analysis: No results found for: COLORURINE, APPEARANCEUR, LABSPEC, PHURINE, GLUCOSEU, HGBUR, BILIRUBINUR, KETONESUR, PROTEINUR, UROBILINOGEN, NITRITE, LEUKOCYTESUR Sepsis Labs: @LABRCNTIP (procalcitonin:4,lacticidven:4)  ) Recent Results (from the past 240 hour(s))  SARS Coronavirus 2 by RT PCR (hospital order, performed in [facility] hospital lab) Nasopharyngeal Nasopharyngeal Swab     Status: None   Collection Time: 06/06/20  7:29 PM   Specimen: Nasopharyngeal Swab  Result Value Ref Range Status   SARS Coronavirus 2 NEGATIVE NEGATIVE Final    Comment: (NOTE) SARS-CoV-2 target nucleic acids are NOT DETECTED.  The SARS-CoV-2 RNA is generally detectable in upper and lower respiratory specimens during the acute phase of infection. The lowest concentration of SARS-CoV-2 viral copies this assay can detect is 250 copies / mL. A negative result does not preclude SARS-CoV-2 infection and should not be used as the sole basis for treatment or other patient management decisions.  A negative result may occur with improper specimen collection / handling, submission of specimen other than nasopharyngeal swab, presence of viral mutation(s) within the areas targeted by this assay, and inadequate number of viral copies (<250 copies / mL). A negative result must be combined with clinical observations, patient history, and epidemiological information.  Fact Sheet for Patients:   StrictlyIdeas.no  Fact Sheet for Healthcare Providers: BankingDealers.co.za  This test is not yet approved or  cleared by the [place] FDA and has been authorized for detection and/or diagnosis of SARS-CoV-2 by FDA under an Emergency Use Authorization (EUA).  This EUA will remain in effect (meaning this test can be used)  for the duration of the COVID-19 declaration under Section 564(b)(1) of the Act, 21 U.S.C. section 360bbb-3(b)(1), unless the authorization is terminated or revoked sooner.  Performed at [facility], [address] [place], [place] [address]       Studies: DG Chest Portable 1 View  Result Date: 06/06/2020 CLINICAL DATA:  Aspiration EXAM: PORTABLE CHEST 1 VIEW COMPARISON:  Chest radiograph dated 08/28/2019 FINDINGS: The heart size and mediastinal contours are within normal limits. There is questionable mild left infrahilar airspace opacity. The right lung is clear. There is no pleural effusion or pneumothorax. The visualized skeletal structures are unremarkable. IMPRESSION: Questionable mild left infrahilar airspace opacity could represent early pneumonia. Electronically Signed   By: Zerita Boers M.D.   On: 06/06/2020 19:58    Scheduled Meds: . aspirin EC  81 mg Oral Daily  . atorvastatin  80 mg Oral q1800  . enoxaparin (LOVENOX) injection  40 mg Subcutaneous Q24H  . finasteride  5 mg Oral QHS  . gabapentin  100 mg Oral BID  . levothyroxine  112 mcg Oral QHS  . pantoprazole  40 mg Oral Q lunch  . primidone  250 mg Oral BID WC  . tamsulosin  0.4 mg Oral QPC supper    Continuous Infusions: . ampicillin-sulbactam (UNASYN) IV Stopped (06/07/20 1540)     LOS: 1 day  Annita Brod, MD [facility]   06/07/2020, 5:54 PM

## 2020-06-07 NOTE — ED Notes (Signed)
Pts troponin is 97. Unit secretary is not at desk to page provider. Provider messaged in secure chat.

## 2020-06-07 NOTE — Plan of Care (Addendum)
Pt to unit via stretcher at this time, patient was able to move from stretcher to bed with minimal assist. Able to move all extremities, denies numbness, tingling or pain. Pt is AxOx4. Pt states he wears hearing aids and has glasses, not present at bedside.  Patient purewick not in place and come to unit via wet in urine. Patient peri care completed with yeast noted to groin and redness in between groin folds. Pt condom cath medium applied and peri care provided with bed pad changed. Gown placed on patient as he was naked upon arrival. Pt oriented to room and how to call for help. Bed alarm placed on. Patient is concerned that he is taking too many medications and is concerned that it is contributing to his falls since he is having frequent falls. IV dressing bleeding upon arrival, site dressing changed and lines tightened. Patient on 2L [place] oxygen, states he is not on oxygen at home. Pt medication help at this time since he is NPO. Pt placed on continuous pulse ox  #80 and Elmo Putt called in tele with Barnetta Chapel as a witness. Skin assessment completed with [place]. This Probation officer completed admission and requested [facility] POA from patient. Pt has a black bag of belongings with him that includes clothing, cellphone, charger. Will continue to monitor closely.

## 2020-06-07 NOTE — ED Notes (Addendum)
Pt in room sleeping. RN talked with pts wife. As per pts wife, pt normally urinates in depends at night. RN offered to put an external urinary catheter on pt. As per wife, she does not want RN to wake pt up, but just wants him to rest. RN informed wife that if he wakes up to let RN know to put him on external device to keep output record

## 2020-06-07 NOTE — ED Notes (Signed)
Pt woke up. Pt noted to have a full, wet depends. Depends removed and male external suction catheter placed on pt.

## 2020-06-07 NOTE — ED Notes (Signed)
IV team at bedside 

## 2020-06-07 NOTE — ED Notes (Signed)
pts oxygen decreased from 4LPM via [place] to 2LPM via 

## 2020-06-08 ENCOUNTER — Encounter: Payer: Self-pay | Admitting: Family Medicine

## 2020-06-08 LAB — CBC
HCT: 36.4 % — ABNORMAL LOW (ref 39.0–52.0)
Hemoglobin: 13 g/dL (ref 13.0–17.0)
MCH: 34.5 pg — ABNORMAL HIGH (ref 26.0–34.0)
MCHC: 35.7 g/dL (ref 30.0–36.0)
MCV: 96.6 fL (ref 80.0–100.0)
Platelets: 147 10*3/uL — ABNORMAL LOW (ref 150–400)
RBC: 3.77 MIL/uL — ABNORMAL LOW (ref 4.22–5.81)
RDW: 12.8 % (ref 11.5–15.5)
WBC: 8.3 10*3/uL (ref 4.0–10.5)
nRBC: 0 % (ref 0.0–0.2)

## 2020-06-08 LAB — BRAIN NATRIURETIC PEPTIDE: B Natriuretic Peptide: 191.4 pg/mL — ABNORMAL HIGH (ref 0.0–100.0)

## 2020-06-08 LAB — BASIC METABOLIC PANEL
Anion gap: 7 (ref 5–15)
BUN: 16 mg/dL (ref 8–23)
CO2: 25 mmol/L (ref 22–32)
Calcium: 7.7 mg/dL — ABNORMAL LOW (ref 8.9–10.3)
Chloride: 107 mmol/L (ref 98–111)
Creatinine, Ser: 0.98 mg/dL (ref 0.61–1.24)
GFR calc Af Amer: >60 mL/min (ref >60)
GFR calc non Af Amer: >60 mL/min (ref >60)
Glucose, Bld: 84 mg/dL (ref 70–99)
Potassium: 3.9 mmol/L (ref 3.5–5.1)
Sodium: 139 mmol/L (ref 135–145)

## 2020-06-08 LAB — PROCALCITONIN: Procalcitonin: 0.12 ng/mL

## 2020-06-08 MED ORDER — NYSTATIN 100000 UNIT/GM EX POWD
Freq: Three times a day (TID) | CUTANEOUS | Status: DC
  Filled 2020-06-08: qty 15

## 2020-06-08 MED ORDER — SENNOSIDES-DOCUSATE SODIUM 8.6-50 MG PO TABS: 2.0000 | ORAL_TABLET | Freq: Every evening | ORAL | Status: DC | PRN

## 2020-06-08 MED ORDER — IPRATROPIUM-ALBUTEROL 0.5-2.5 (3) MG/3ML IN SOLN: 3.0000 mL | RESPIRATORY_TRACT | Status: DC | PRN

## 2020-06-08 MED ORDER — POLYETHYLENE GLYCOL 3350 17 G PO PACK: 17.0000 g | PACK | Freq: Every day | ORAL | Status: DC | PRN

## 2020-06-08 NOTE — Progress Notes (Signed)
PROGRESS NOTE    Eric Torres  CWC:[id number] DOB: September 26, 1932 DOA: 06/06/2020 PCP: Kirk Ruths, MD   Brief Narrative:  86 year old with history of Parkinson's disease, CVA, chronic dysphagia had an episode of choking on 8/28 followed by becoming unresponsive.  EMS was called, he was noted to be hypotensive.  He was hypoxic at that time.  Admitted for aspiration pneumonia/pneumonitis started on antibiotics.   Assessment & Plan:   Principal Problem:   Acute respiratory failure with hypoxia (HCC) Active Problems:   Tremor   Aspiration pneumonia (HCC)   HTN (hypertension)   History of CVA (cerebrovascular accident)   Hypotension   Dysphagia   Parkinson's disease ([place])  Acute hypoxic respiratory failure secondary to aspiration pneumonitis History of Parkinson's disease with dysphagia -Currently patient is n.p.o. -ProCal 0.12, will stop Abx. BNP barely elevated.  -Incentive spirometer/flutter valve, as needed bronchodilators -Aspiration precautions -Out of bed to chair -Speech and swallow evaluation- -PT/OT evaluation- HH  Essential hypertension -Currently on none due to episode of hypotension  History of CVA -Continue aspirin and statin  Hypothyroidism -Daily Synthroid  BPH -Flomax  GERD -PPI    DVT prophylaxis: enoxaparin (LOVENOX) injection 40 mg Start: 06/06/20 2200 Code Status: DNR Family Communication:  Wife at bedside   Status is: Inpatient  Remains inpatient appropriate because:Inpatient level of care appropriate due to severity of illness   Dispo: The patient is from: Home              Anticipated d/c is to: Home              Anticipated d/c date is: 1 day              Patient currently is not medically stable to d/c. Still pending speech and swallow eval. Keep him off O2.     Body mass index is 29.1 kg/m.     Subjective: Sitting up on the chair having some coughing. Desat's down to 88% with minimal movement.  No new  complaints. Feels little better.   Review of Systems Otherwise negative except as per HPI, including: General: Denies fever, chills, night sweats or unintended weight loss. Resp: Denies cough, wheezing, shortness of breath. Cardiac: Denies chest pain, palpitations, orthopnea, paroxysmal nocturnal dyspnea. GI: Denies abdominal pain, nausea, vomiting, diarrhea or constipation GU: Denies dysuria, frequency, hesitancy or incontinence MS: Denies muscle aches, joint pain or swelling Neuro: Denies headache, neurologic deficits (focal weakness, numbness, tingling), abnormal gait Psych: Denies anxiety, depression, SI/HI/AVH Skin: Denies new rashes or lesions ID: Denies sick contacts, exotic exposures, travel  Examination:  Constitutional: Not in acute distress; 2L [place] Respiratory: b/l rhonchi Cardiovascular: Normal sinus rhythm, no rubs Abdomen: Nontender nondistended good bowel sounds Musculoskeletal: No edema noted Skin: No rashes seen Neurologic: CN 2-12 grossly intact.  And nonfocal Psychiatric: Normal judgment and insight. Alert and oriented x 3. Normal mood.     Objective: Vitals:   06/07/20 1630 06/07/20 1749 06/07/20 2053 06/08/20 0454  BP: 138/62 (!) 156/75 (!) 155/86 (!) 157/66  Pulse: 65 69 67 61  Resp: 16 16 20 20   Temp:  98.1 F (36.7 C) 98 F (36.7 C) 98.3 F (36.8 C)  TempSrc:  Oral Oral Oral  SpO2: 98% 100% 100% 97%  Weight:  86.8 kg    Height:        Intake/Output Summary (Last 24 hours) at 06/08/2020 0846 Last data filed at 06/08/2020 0500 Gross per 24 hour  Intake 568.67 ml  Output 350  ml  Net 218.67 ml   Filed Weights   06/06/20 2120 06/07/20 1749  Weight: 87.1 kg 86.8 kg     Data Reviewed:   CBC: Recent Labs  Lab 06/06/20 1929 06/07/20 0935 06/08/20 0738  WBC 12.8* 13.7* 8.3  NEUTROABS 8.3*  --   --   HGB 17.3* 14.0 13.0  HCT 49.5 39.1 36.4*  MCV 96.9 95.8 96.6  PLT 257 170 973*   Basic Metabolic Panel: Recent Labs  Lab 06/06/20 1929   NA 135  K 4.1  CL 101  CO2 21*  GLUCOSE 152*  BUN 16  CREATININE 1.17  CALCIUM 8.2*   GFR: Estimated Creatinine Clearance: 48.6 mL/min (by C-G formula based on SCr of 1.17 mg/dL). Liver Function Tests: Recent Labs  Lab 06/06/20 1929  AST 37  ALT 30  ALKPHOS 58  BILITOT 0.8  PROT 6.0*  ALBUMIN 3.2*   No results for input(s): LIPASE, AMYLASE in the last 168 hours. No results for input(s): AMMONIA in the last 168 hours. Coagulation Profile: No results for input(s): INR, PROTIME in the last 168 hours. Cardiac Enzymes: No results for input(s): CKTOTAL, CKMB, CKMBINDEX, TROPONINI in the last 168 hours. BNP (last 3 results) No results for input(s): PROBNP in the last 8760 hours. HbA1C: No results for input(s): HGBA1C in the last 72 hours. CBG: Recent Labs  Lab 06/07/20 2131  GLUCAP 76   Lipid Profile: No results for input(s): CHOL, HDL, LDLCALC, TRIG, CHOLHDL, LDLDIRECT in the last 72 hours. Thyroid Function Tests: No results for input(s): TSH, T4TOTAL, FREET4, T3FREE, THYROIDAB in the last 72 hours. Anemia Panel: No results for input(s): VITAMINB12, FOLATE, FERRITIN, TIBC, IRON, RETICCTPCT in the last 72 hours. Sepsis Labs: No results for input(s): PROCALCITON, LATICACIDVEN in the last 168 hours.  Recent Results (from the past 240 hour(s))  SARS Coronavirus 2 by RT PCR (hospital order, performed in [facility] hospital lab) Nasopharyngeal Nasopharyngeal Swab     Status: None   Collection Time: 06/06/20  7:29 PM   Specimen: Nasopharyngeal Swab  Result Value Ref Range Status   SARS Coronavirus 2 NEGATIVE NEGATIVE Final    Comment: (NOTE) SARS-CoV-2 target nucleic acids are NOT DETECTED.  The SARS-CoV-2 RNA is generally detectable in upper and lower respiratory specimens during the acute phase of infection. The lowest concentration of SARS-CoV-2 viral copies this assay can detect is 250 copies / mL. A negative result does not preclude SARS-CoV-2 infection and  should not be used as the sole basis for treatment or other patient management decisions.  A negative result may occur with improper specimen collection / handling, submission of specimen other than nasopharyngeal swab, presence of viral mutation(s) within the areas targeted by this assay, and inadequate number of viral copies (<250 copies / mL). A negative result must be combined with clinical observations, patient history, and epidemiological information.  Fact Sheet for Patients:   StrictlyIdeas.no  Fact Sheet for Healthcare Providers: BankingDealers.co.za  This test is not yet approved or  cleared by the [place] FDA and has been authorized for detection and/or diagnosis of SARS-CoV-2 by FDA under an Emergency Use Authorization (EUA).  This EUA will remain in effect (meaning this test can be used) for the duration of the COVID-19 declaration under Section 564(b)(1) of the Act, 21 U.S.C. section 360bbb-3(b)(1), unless the authorization is terminated or revoked sooner.  Performed at [facility], [address] [place], [place] [address]          Radiology  Studies: DG Chest Portable 1 View  Result Date: 06/06/2020 CLINICAL DATA:  Aspiration EXAM: PORTABLE CHEST 1 VIEW COMPARISON:  Chest radiograph dated 08/28/2019 FINDINGS: The heart size and mediastinal contours are within normal limits. There is questionable mild left infrahilar airspace opacity. The right lung is clear. There is no pleural effusion or pneumothorax. The visualized skeletal structures are unremarkable. IMPRESSION: Questionable mild left infrahilar airspace opacity could represent early pneumonia. Electronically Signed   By: Zerita Boers M.D.   On: 06/06/2020 19:58        Scheduled Meds: . aspirin EC  81 mg Oral Daily  . atorvastatin  80 mg Oral q1800  . enoxaparin (LOVENOX) injection  40 mg Subcutaneous Q24H  . finasteride  5 mg Oral QHS   . gabapentin  100 mg Oral BID  . levothyroxine  112 mcg Oral QHS  . pantoprazole  40 mg Oral Q lunch  . primidone  250 mg Oral BID WC  . tamsulosin  0.4 mg Oral QPC supper   Continuous Infusions: . sodium chloride 10 mL/hr at 06/08/20 0500  . ampicillin-sulbactam (UNASYN) IV Stopped (06/08/20 0333)     LOS: 2 days   Time spent= 25 mins    Agusta Hackenberg Arsenio Loader, MD [facility]  If 7PM-7AM, please contact night-coverage  06/08/2020, 8:46 AM

## 2020-06-08 NOTE — Evaluation (Signed)
Occupational Therapy Evaluation Patient Details Name: Eric Torres MRN: [id number] DOB: Apr 17, 1932 Today's Date: 06/08/2020    History of Present Illness Eric Torres is an 86 y/o male who was admitted for hypoxia after choking episode where he took a few bites of food, started choking and slumped over unresponsive. PMH includes Parkinsonism with recent dysphagia, essential tremor, HTN, PVD, and hypothyroidism.   Clinical Impression   Pt was seen for OT evaluation this date. Prior to hospital admission, pt was independent with ADL and household mobility, walking stick for community mobility, and attending water aerobics 3x/wk. Pt lives with his spouse who can provide assist. Currently pt demonstrates impairments as described below (See OT problem list) which functionally limit his ability to perform ADL/self-care tasks. Pt currently requires supervision for ADL tasks, CGA to MIN A for ADL transfers. Pt/spouse instructed in home/routines modifications to improve independence and safety while minimizing low back pain with bending over counter for grooming tasks, falls prevention strategies. Both verbalized understanding. Pt would benefit from skilled OT to address noted impairments and functional limitations (see below for any additional details) in order to maximize safety and independence while minimizing falls risk and caregiver burden. Upon hospital discharge, recommend HHOT to maximize pt safety and return to functional independence during meaningful occupations of daily life.     Follow Up Recommendations  Home health OT    Equipment Recommendations  None recommended by OT    Recommendations for Other Services       Precautions / Restrictions Precautions Precautions: Fall Restrictions Weight Bearing Restrictions: No      Mobility Bed Mobility Overal bed mobility: Needs Assistance Bed Mobility: Supine to Sit     Supine to sit: Min guard;HOB elevated     General bed  mobility comments: deferred, up in recliner at start and end of session  Transfers Overall transfer level: Needs assistance Equipment used: None Transfers: Sit to/from Stand Sit to Stand: Min assist         General transfer comment: Min A for sit to stand transfer for steadying when coming into standing as pt with posterior lean. Stand to sit with CGA for eccentric control onto descent. Verbal cues for hand placement.    Balance Overall balance assessment: Needs assistance Sitting-balance support: Feet supported;Bilateral upper extremity supported Sitting balance-Leahy Scale: Fair Sitting balance - Comments: noted posterior lean while seated EOB requiring CGA for safety Postural control: Posterior lean Standing balance support: Bilateral upper extremity supported;Single extremity supported Standing balance-Leahy Scale: Fair Standing balance comment: pt with heavy posterior lean initially requiring min A for balance; verbal cues to place bilat. hands on counter with improved COG within BOS; pt then transitioned to single UE on IV pole and CGA for safety                           ADL either performed or assessed with clinical judgement   ADL Overall ADL's : Needs assistance/impaired                                       General ADL Comments: Pt able to perform LB ADL tasks with supervision for safety, no LOB, CGA-MIN A for ADL transfers; spouse able to provide needed level of assist     Vision Baseline Vision/History: Wears glasses Wears Glasses: At all times Patient Visual Report: No change from  baseline       Perception     Praxis      Pertinent Vitals/Pain Pain Assessment: 0-10 Faces Pain Scale: Hurts a little bit Pain Location: back Pain Descriptors / Indicators: Discomfort Pain Intervention(s): Monitored during session;Repositioned     Hand Dominance     Extremity/Trunk Assessment Upper Extremity Assessment Upper Extremity  Assessment: Generalized weakness (grossly 3+ to 4-/5 bilaterally)   Lower Extremity Assessment Lower Extremity Assessment: Generalized weakness (grossly 3+ to 4-/5 bilaterally)       Communication Communication Communication: HOH   Cognition Arousal/Alertness: Awake/alert Behavior During Therapy: WFL for tasks assessed/performed Overall Cognitive Status: Within Functional Limits for tasks assessed                                 General Comments: Pt A&O x 4 and speaks slowly but appropriate to conversations.   General Comments       Exercises Exercises: Other exercises Other Exercises Other Exercises: pt performed alternating seated marches and LAQ x 10 reps each; verbal cues for controlled concentric and eccentric movements with good carryover Other Exercises: Pt/spouse instructed in home/routines modifications to improve independence and safety while minimizing low back pain with bending over counter for grooming tasks, falls prevention strategies   Shoulder Instructions      Home Living Family/patient expects to be discharged to:: Private residence Living Arrangements: Spouse/significant other;Children Available Help at Discharge: Family;Available PRN/intermittently Type of Home: House Home Access: Stairs to enter [employer] of Steps: 1 Entrance Stairs-Rails: None Home Layout: Two level;Able to live on main level with bedroom/bathroom     Bathroom Shower/Tub: Occupational psychologist: Standard     Home Equipment: Environmental consultant - 2 wheels;Walker - 4 wheels;Shower seat;Grab bars - toilet;Grab bars - tub/shower;Cane - quad   Additional Comments: Owns walking stick      Prior Functioning/Environment Level of Independence: Independent with assistive device(s)        Comments: Pt reports use of walking stick when outside of the home and no AD in the home; pt also endorses limited community ambulation; pt reports requiring occasional  assistance for standing from chair depending on height of chair; pt's wife states that pt attended water aerobics 3x/wk.; pt endorses fall history; pt reports indep with basic ADL, family assists with med mgt        OT Problem List: Decreased strength;Decreased coordination;Decreased activity tolerance;Impaired balance (sitting and/or standing);Decreased knowledge of use of DME or AE      OT Treatment/Interventions: Self-care/ADL training;Therapeutic exercise;Therapeutic activities;DME and/or AE instruction;Patient/family education;Balance training;Energy conservation    OT Goals(Current goals can be found in the care plan section) Acute Rehab OT Goals Patient Stated Goal: to get stronger and go home OT Goal Formulation: With patient/family Time For Goal Achievement: 06/22/20 Potential to Achieve Goals: Good ADL Goals Additional ADL Goal #1: Pt will perform AM ADL routine with supervision and incorporating learned ECS/back safety strategies to minimize low back pain/falls risk. Additional ADL Goal #2: Pt will independently verbalize plan to implement at least 2 learned falls prevention strategies to maximize safety and minimize falls risk.  OT Frequency: Min 1X/week   Barriers to D/C:            Co-evaluation              AM-PAC OT "6 Clicks" Daily Activity     Outcome Measure Help from another person eating meals?: None  Help from another person taking care of personal grooming?: None Help from another person toileting, which includes using toliet, bedpan, or urinal?: A Little Help from another person bathing (including washing, rinsing, drying)?: A Little Help from another person to put on and taking off regular upper body clothing?: None Help from another person to put on and taking off regular lower body clothing?: A Little 6 Click Score: 21   End of Session    Activity Tolerance: Patient tolerated treatment well Patient left: in chair;with call bell/phone within  reach;with chair alarm set;with family/visitor present;Other (comment) (MD in room)  OT Visit Diagnosis: Other abnormalities of gait and mobility (R26.89);Muscle weakness (generalized) (M62.81)                Time: 2703-5009 OT Time Calculation (min): 18 min Charges:  OT General Charges $OT Visit: 1 Visit OT Evaluation $OT Eval Moderate Complexity: 1 Mod OT Treatments $Self Care/Home Management : 8-22 mins  Jeni Salles, MPH, MS, OTR/L ascom [phone] 06/08/20, 1:10 PM

## 2020-06-08 NOTE — Evaluation (Signed)
Physical Therapy Evaluation Patient Details Name: JAIMIN KRUPKA MRN: [id number] DOB: [birth year] Today's Date: 06/08/2020   History of Present Illness  Zakari "Mauri" Rains is an 86 y/o male who was admitted for hypoxia after choking episode where he took a few bites of food, started choking and slumped over unresponsive. PMH includes Parkinsonism with recent dysphagia, essential tremor, HTN, PVD, and hypothyroidism.  Clinical Impression  Pt lying in bed with HOB elevated high, wife present at bedside, and pt on room air. Pt O2 sats at 96%. Pt agreeable to participate in PT this morning. Pt with decreased speed of movements and required increased time and CGA to perform supine to sit edge of bed for trunk elevation and pivoting hips to scoot to edge of bed. Pt required supervision to CGA for sitting balance secondary to slight posterior lean. Pt with good carryover of verbal cues to correct. Pt stood with min A secondary to posterior lean. Once upright, pt required CGA and single UE usage on IV pole for steadying. Pt ambulated with decreased gait speed and was noted to stagger to both L and R. Pt desat to 88% however recovered to 96-100% with pursed lip breathing within 15 seconds and remained above 96% for rest of ambulation distance. Pt would benefit from skilled therapy during acute stay to address deficits in balance, strength, endurance, and gait. Recommend HHPT at discharge to optimize return to PLOF, decrease falls risk, and maximize functional mobility.    Follow Up Recommendations Home health PT    Equipment Recommendations  None recommended by PT    Recommendations for Other Services       Precautions / Restrictions Precautions Precautions: Fall Restrictions Weight Bearing Restrictions: No      Mobility  Bed Mobility Overal bed mobility: Needs Assistance Bed Mobility: Supine to Sit     Supine to sit: Min guard;HOB elevated     General bed mobility comments: Pt able to  initiate movement and required CGA to scoot hips forward and elevate trunk into full upright position.  Transfers Overall transfer level: Needs assistance Equipment used: None Transfers: Sit to/from Stand Sit to Stand: Min assist         General transfer comment: Min A for sit to stand transfer for steadying when coming into standing as pt with posterior lean. Stand to sit with CGA for eccentric control onto descent. Verbal cues for hand placement.  Ambulation/Gait Ambulation/Gait assistance: Min guard Gait Distance (Feet): 220 Feet Assistive device: IV Pole Gait Pattern/deviations: Staggering left;Staggering right;Step-through pattern Gait velocity: decreased   General Gait Details: Pt ambulated 220 feet in hallway using IV pole and required CGA for steadying. Pt with occasional staggering to both L and R and noted decreased foot clearance bilaterally.   Stairs            Wheelchair Mobility    Modified Rankin (Stroke Patients Only)       Balance Overall balance assessment: Needs assistance Sitting-balance support: Feet supported;Bilateral upper extremity supported Sitting balance-Leahy Scale: Fair Sitting balance - Comments: noted posterior lean while seated EOB requiring CGA for safety Postural control: Posterior lean Standing balance support: Bilateral upper extremity supported;Single extremity supported Standing balance-Leahy Scale: Fair Standing balance comment: pt with heavy posterior lean initially requiring min A for balance; verbal cues to place bilat. hands on counter with improved COG within BOS; pt then transitioned to single UE on IV pole and CGA for safety  Pertinent Vitals/Pain Pain Assessment: Faces Faces Pain Scale: Hurts a little bit Pain Location: back Pain Descriptors / Indicators: Discomfort Pain Intervention(s): Monitored during session;Repositioned    Home Living Family/patient expects to be  discharged to:: Private residence Living Arrangements: Spouse/significant other;Children Available Help at Discharge: Family;Available PRN/intermittently Type of Home: House Home Access: Stairs to enter Entrance Stairs-Rails: None Entrance Stairs-Number of Steps: 1 Home Layout: Two level;Able to live on main level with bedroom/bathroom Home Equipment: Gilford Rile - 2 wheels;Walker - 4 wheels;Shower seat;Grab bars - toilet;Grab bars - tub/shower;Cane - quad Additional Comments: Owns walking stick    Prior Function Level of Independence: Independent with assistive device(s)         Comments: Pt reports use of walking stick when outside of the home and no AD in the home; pt also endorses limited community ambulation; pt reports requiring occasional assistance for standing from chair depending on height of chair; pt's wife states that pt attended water aerobics 3x/wk.; pt endorses fall history     Hand Dominance        Extremity/Trunk Assessment   Upper Extremity Assessment Upper Extremity Assessment: Generalized weakness (grossly 3+ to 4-/5 bilaterally)    Lower Extremity Assessment Lower Extremity Assessment: Generalized weakness (grossly 3+ to 4-/5 bilaterally)       Communication   Communication: HOH  Cognition Arousal/Alertness: Awake/alert Behavior During Therapy: WFL for tasks assessed/performed Overall Cognitive Status: Within Functional Limits for tasks assessed                                 General Comments: Pt A&O x 4 and speaks slowly but appropriate to conversations.      General Comments      Exercises Other Exercises Other Exercises: pt performed alternating seated marches and LAQ x 10 reps each; verbal cues for controlled concentric and eccentric movements with good carryover   Assessment/Plan    PT Assessment Patient needs continued PT services  PT Problem List Decreased strength;Decreased activity tolerance;Decreased balance;Decreased  mobility;Decreased coordination       PT Treatment Interventions DME instruction;Gait training;Stair training;Functional mobility training;Therapeutic activities;Therapeutic exercise;Balance training;Patient/family education    PT Goals (Current goals can be found in the Care Plan section)  Acute Rehab PT Goals Patient Stated Goal: to get stronger and go home PT Goal Formulation: With patient Time For Goal Achievement: 06/22/20 Potential to Achieve Goals: Good    Frequency Min 2X/week   Barriers to discharge        Co-evaluation               AM-PAC PT "6 Clicks" Mobility  Outcome Measure Help needed turning from your back to your side while in a flat bed without using bedrails?: None Help needed moving from lying on your back to sitting on the side of a flat bed without using bedrails?: A Little Help needed moving to and from a bed to a chair (including a wheelchair)?: A Little Help needed standing up from a chair using your arms (e.g., wheelchair or bedside chair)?: A Little Help needed to walk in hospital room?: A Little Help needed climbing 3-5 steps with a railing? : A Little 6 Click Score: 19    End of Session Equipment Utilized During Treatment: Gait belt Activity Tolerance: Patient tolerated treatment well Patient left: in chair;with call bell/phone within reach;with chair alarm set;with family/visitor present Nurse Communication: Mobility status PT Visit Diagnosis: Unsteadiness on feet (R26.81);Other  abnormalities of gait and mobility (R26.89);Muscle weakness (generalized) (M62.81);History of falling (Z91.81)    Time: 1020-1106 PT Time Calculation (min) (ACUTE ONLY): 46 min   Charges:              Vale Haven, SPT  Vale Haven 06/08/2020, 12:33 PM

## 2020-06-09 LAB — BASIC METABOLIC PANEL
Anion gap: 4 — ABNORMAL LOW (ref 5–15)
BUN: 14 mg/dL (ref 8–23)
CO2: 27 mmol/L (ref 22–32)
Calcium: 8.1 mg/dL — ABNORMAL LOW (ref 8.9–10.3)
Chloride: 106 mmol/L (ref 98–111)
Creatinine, Ser: 1.03 mg/dL (ref 0.61–1.24)
GFR calc Af Amer: >60 mL/min (ref >60)
GFR calc non Af Amer: >60 mL/min (ref >60)
Glucose, Bld: 93 mg/dL (ref 70–99)
Potassium: 3.8 mmol/L (ref 3.5–5.1)
Sodium: 137 mmol/L (ref 135–145)

## 2020-06-09 LAB — CBC
HCT: 37.7 % — ABNORMAL LOW (ref 39.0–52.0)
Hemoglobin: 13.5 g/dL (ref 13.0–17.0)
MCH: 34.4 pg — ABNORMAL HIGH (ref 26.0–34.0)
MCHC: 35.8 g/dL (ref 30.0–36.0)
MCV: 96.2 fL (ref 80.0–100.0)
Platelets: 150 10*3/uL (ref 150–400)
RBC: 3.92 MIL/uL — ABNORMAL LOW (ref 4.22–5.81)
RDW: 12.8 % (ref 11.5–15.5)
WBC: 8.5 10*3/uL (ref 4.0–10.5)
nRBC: 0 % (ref 0.0–0.2)

## 2020-06-09 LAB — MAGNESIUM: Magnesium: 2.1 mg/dL (ref 1.7–2.4)

## 2020-06-09 MED ORDER — SENNOSIDES-DOCUSATE SODIUM 8.6-50 MG PO TABS: 2.0000 | ORAL_TABLET | Freq: Every evening | ORAL | 0 refills | Status: DC | PRN

## 2020-06-09 NOTE — Discharge Summary (Signed)
Physician Discharge Summary  Eric Torres JXB:[id number] DOB: 22-Oct-1931 DOA: 06/06/2020  PCP: Kirk Ruths, MD  Admit date: 06/06/2020 Discharge date: 06/09/2020  Admitted From: Home Disposition: Home  Recommendations for Outpatient Follow-up:  1. Follow up with PCP in 1-2 weeks 2. Please obtain BMP/CBC in one week your next doctors visit.  3. Advised to follow dysphagia 3 diet at home, instructions given   Discharge Condition: Stable CODE STATUS: DNR Diet recommendation: Dysphagia 3  Brief/Interim Summary: 86 year old with history of Parkinson's disease, CVA, chronic dysphagia had an episode of choking on 8/28 followed by becoming unresponsive.  EMS was called, he was noted to be hypotensive.  He was hypoxic at that time.  Admitted for aspiration pneumonia/pneumonitis started on antibiotics.  Over the course of 24 hours he was weaned off antibiotics as procalcitonin levels were pretty much negative he was weaned off oxygen.  Seen by speech and swallow therapist recommended dysphagia 3 diet.  Currently patient is back to his baseline and stable for discharge with outpatient follow-up.  I had extensive discussion with the patient and his wife regarding common precautions to be used for aspiration.  All the questions have been answered.   Assessment & Plan:   Principal Problem:   Acute respiratory failure with hypoxia (HCC) Active Problems:   Tremor   Aspiration pneumonia (HCC)   HTN (hypertension)   History of CVA (cerebrovascular accident)   Hypotension   Dysphagia   Parkinson's disease ([place])  Acute hypoxic respiratory failure secondary to aspiration pneumonitis History of Parkinson's disease with dysphagia -Hypoxia is resolved, procalcitonin pretty much negative therefore no further antibiotics needed.  Maintain aspiration precautions at home, dysphagia 3 diet.  Seen by speech and swallow therapist during the hospitalization (dont see a formal note).  PT OT  recommended home health.  Arrangements made  Essential hypertension -Resume home meds  History of CVA -Continue aspirin and statin  Hypothyroidism -Daily Synthroid  BPH -Flomax  GERD -PPI   Body mass index is 29.1 kg/m.     Discharge Diagnoses:  Principal Problem:   Acute respiratory failure with hypoxia (HCC) Active Problems:   Tremor   Aspiration pneumonia (HCC)   HTN (hypertension)   History of CVA (cerebrovascular accident)   Hypotension   Dysphagia   Parkinson's disease ([place])    Subjective: Feels ok, sitting up on the bed. Feels back to his baseline off Oxygen.   Discharge Exam: Vitals:   06/08/20 1949 06/09/20 0449  BP: (!) 138/54 (!) 167/86  Pulse: 66 (!) 59  Resp: 20 20  Temp: 97.9 F (36.6 C) (!) 97.5 F (36.4 C)  SpO2: 99% 98%   Vitals:   06/08/20 1316 06/08/20 1400 06/08/20 1949 06/09/20 0449  BP: (!) 156/78  (!) 138/54 (!) 167/86  Pulse: 67 65 66 (!) 59  Resp: 15  20 20   Temp: 97.7 F (36.5 C)  97.9 F (36.6 C) (!) 97.5 F (36.4 C)  TempSrc: Oral     SpO2: 98% 97% 99% 98%  Weight:      Height:        General: Pt is alert, awake, not in acute distress Cardiovascular: RRR, S1/S2 +, no rubs, no gallops Respiratory: CTAB/L Abdominal: Soft, NT, ND, bowel sounds + Extremities: no edema, no cyanosis  Discharge Instructions   Allergies as of 06/09/2020   No Known Allergies     Medication List    TAKE these medications   acetaminophen 500 MG tablet Commonly known as: TYLENOL Take  500 mg by mouth every 6 (six) hours as needed.   aspirin 81 MG tablet Take 81 mg by mouth daily.   atorvastatin 80 MG tablet Commonly known as: LIPITOR Take 80 mg by mouth daily at 6 PM.   Bladder 2.2 Tabs Take 1 tablet by mouth 2 (two) times daily.   finasteride 5 MG tablet Commonly known as: PROSCAR Take 5 mg by mouth at bedtime.   gabapentin 100 MG capsule Commonly known as: NEURONTIN Take 100 mg by mouth 2 (two) times daily.    levothyroxine 112 MCG tablet Commonly known as: SYNTHROID Take 112 mcg by mouth at bedtime.   pantoprazole 40 MG tablet Commonly known as: PROTONIX Take 40 mg by mouth daily with lunch.   primidone 250 MG tablet Commonly known as: MYSOLINE Take 250 mg by mouth 2 (two) times daily with a meal.   propranolol 20 MG tablet Commonly known as: INDERAL Take 20 mg by mouth 2 (two) times daily.   rOPINIRole 0.25 MG tablet Commonly known as: REQUIP Take 2 tablets by mouth in the morning, at noon, and at bedtime.   senna-docusate 8.6-50 MG tablet Commonly known as: Senokot-S Take 2 tablets by mouth at bedtime as needed for mild constipation or moderate constipation.   tamsulosin 0.4 MG Caps capsule Commonly known as: FLOMAX Take 0.4 mg by mouth daily after supper.       Follow-up Information    Kirk Ruths, MD. Go on 06/17/2020.   Specialty: Internal Medicine Why: 11:45am appointment Contact information: [address] [place] [address] [phone]        Home, [facility] Follow up.   Specialty: Home Health Services Why: They will follow up with you for your home health physical and occupational therapy needs. Start of care will likely be over the weekend 9/4 or 9/5. Contact information: [address] [place] [address] [phone]              No Known Allergies  You were cared for by a hospitalist during your hospital stay. If you have any questions about your discharge medications or the care you received while you were in the hospital after you are discharged, you can call the unit and asked to speak with the hospitalist on call if the hospitalist that took care of you is not available. Once you are discharged, your primary care physician will handle any further medical issues. Please note that no refills for any discharge medications will be authorized once you are discharged, as it is imperative that you  return to your primary care physician (or establish a relationship with a primary care physician if you do not have one) for your aftercare needs so that they can reassess your need for medications and monitor your lab values.   Procedures/Studies: DG Chest Portable 1 View  Result Date: 06/06/2020 CLINICAL DATA:  Aspiration EXAM: PORTABLE CHEST 1 VIEW COMPARISON:  Chest radiograph dated 08/28/2019 FINDINGS: The heart size and mediastinal contours are within normal limits. There is questionable mild left infrahilar airspace opacity. The right lung is clear. There is no pleural effusion or pneumothorax. The visualized skeletal structures are unremarkable. IMPRESSION: Questionable mild left infrahilar airspace opacity could represent early pneumonia. Electronically Signed   By: Zerita Boers M.D.   On: 06/06/2020 19:58      The results of significant diagnostics from this hospitalization (including imaging, microbiology, ancillary and laboratory) are listed below for reference.  Microbiology: Recent Results (from the past 240 hour(s))  SARS Coronavirus 2 by RT PCR (hospital order, performed in [facility] hospital lab) Nasopharyngeal Nasopharyngeal Swab     Status: None   Collection Time: 06/06/20  7:29 PM   Specimen: Nasopharyngeal Swab  Result Value Ref Range Status   SARS Coronavirus 2 NEGATIVE NEGATIVE Final    Comment: (NOTE) SARS-CoV-2 target nucleic acids are NOT DETECTED.  The SARS-CoV-2 RNA is generally detectable in upper and lower respiratory specimens during the acute phase of infection. The lowest concentration of SARS-CoV-2 viral copies this assay can detect is 250 copies / mL. A negative result does not preclude SARS-CoV-2 infection and should not be used as the sole basis for treatment or other patient management decisions.  A negative result may occur with improper specimen collection / handling, submission of specimen other than nasopharyngeal swab, presence of viral  mutation(s) within the areas targeted by this assay, and inadequate number of viral copies (<250 copies / mL). A negative result must be combined with clinical observations, patient history, and epidemiological information.  Fact Sheet for Patients:   StrictlyIdeas.no  Fact Sheet for Healthcare Providers: BankingDealers.co.za  This test is not yet approved or  cleared by the [place] FDA and has been authorized for detection and/or diagnosis of SARS-CoV-2 by FDA under an Emergency Use Authorization (EUA).  This EUA will remain in effect (meaning this test can be used) for the duration of the COVID-19 declaration under Section 564(b)(1) of the Act, 21 U.S.C. section 360bbb-3(b)(1), unless the authorization is terminated or revoked sooner.  Performed at [facility], [address] [place], [place] [address]      Labs: BNP (last 3 results) Recent Labs    06/08/20 0738  BNP 761.6*   Basic Metabolic Panel: Recent Labs  Lab 06/06/20 1929 06/08/20 0738 06/09/20 0648  NA 135 139 137  K 4.1 3.9 3.8  CL 101 107 106  CO2 21* 25 27  GLUCOSE 152* 84 93  BUN 16 16 14   CREATININE 1.17 0.98 1.03  CALCIUM 8.2* 7.7* 8.1*  MG  --   --  2.1   Liver Function Tests: Recent Labs  Lab 06/06/20 1929  AST 37  ALT 30  ALKPHOS 58  BILITOT 0.8  PROT 6.0*  ALBUMIN 3.2*   No results for input(s): LIPASE, AMYLASE in the last 168 hours. No results for input(s): AMMONIA in the last 168 hours. CBC: Recent Labs  Lab 06/06/20 1929 06/07/20 0935 06/08/20 0738 06/09/20 0648  WBC 12.8* 13.7* 8.3 8.5  NEUTROABS 8.3*  --   --   --   HGB 17.3* 14.0 13.0 13.5  HCT 49.5 39.1 36.4* 37.7*  MCV 96.9 95.8 96.6 96.2  PLT 257 170 147* 150   Cardiac Enzymes: No results for input(s): CKTOTAL, CKMB, CKMBINDEX, TROPONINI in the last 168 hours. BNP: Invalid input(s): POCBNP CBG: Recent Labs  Lab 06/07/20 2131  GLUCAP 76    D-Dimer No results for input(s): DDIMER in the last 72 hours. Hgb A1c No results for input(s): HGBA1C in the last 72 hours. Lipid Profile No results for input(s): CHOL, HDL, LDLCALC, TRIG, CHOLHDL, LDLDIRECT in the last 72 hours. Thyroid function studies No results for input(s): TSH, T4TOTAL, T3FREE, THYROIDAB in the last 72 hours.  Invalid input(s): FREET3 Anemia work up No results for input(s): VITAMINB12, FOLATE, FERRITIN, TIBC, IRON, RETICCTPCT in the last 72 hours. Urinalysis No results found for: COLORURINE, APPEARANCEUR, [place], [place], [place], [place], [place], [place],  PROTEINUR, UROBILINOGEN, NITRITE, LEUKOCYTESUR Sepsis Labs Invalid input(s): PROCALCITONIN,  WBC,  LACTICIDVEN Microbiology Recent Results (from the past 240 hour(s))  SARS Coronavirus 2 by RT PCR (hospital order, performed in [facility] hospital lab) Nasopharyngeal Nasopharyngeal Swab     Status: None   Collection Time: 06/06/20  7:29 PM   Specimen: Nasopharyngeal Swab  Result Value Ref Range Status   SARS Coronavirus 2 NEGATIVE NEGATIVE Final    Comment: (NOTE) SARS-CoV-2 target nucleic acids are NOT DETECTED.  The SARS-CoV-2 RNA is generally detectable in upper and lower respiratory specimens during the acute phase of infection. The lowest concentration of SARS-CoV-2 viral copies this assay can detect is 250 copies / mL. A negative result does not preclude SARS-CoV-2 infection and should not be used as the sole basis for treatment or other patient management decisions.  A negative result may occur with improper specimen collection / handling, submission of specimen other than nasopharyngeal swab, presence of viral mutation(s) within the areas targeted by this assay, and inadequate number of viral copies (<250 copies / mL). A negative result must be combined with clinical observations, patient history, and epidemiological information.  Fact Sheet for Patients:    StrictlyIdeas.no  Fact Sheet for Healthcare Providers: BankingDealers.co.za  This test is not yet approved or  cleared by the [place] FDA and has been authorized for detection and/or diagnosis of SARS-CoV-2 by FDA under an Emergency Use Authorization (EUA).  This EUA will remain in effect (meaning this test can be used) for the duration of the COVID-19 declaration under Section 564(b)(1) of the Act, 21 U.S.C. section 360bbb-3(b)(1), unless the authorization is terminated or revoked sooner.  Performed at [facility], [address] [place], [place] [address]      Time coordinating discharge:  I have spent 35 minutes face to face with the patient and on the ward discussing the patients care, assessment, plan and disposition with other care givers. >50% of the time was devoted counseling the patient about the risks and benefits of treatment/Discharge disposition and coordinating care.   SIGNED:   Damita Lack, MD  [facility] 06/09/2020, 12:02 PM   If 7PM-7AM, please contact night-coverage

## 2020-06-09 NOTE — Care Management Important Message (Signed)
Important Message  Patient Details  Name: Eric Torres MRN: [id number] Date of Birth: 10-25-1931   Medicare Important Message Given:  Yes     Dannette Barbara 06/09/2020, 10:47 AM

## 2020-06-09 NOTE — Plan of Care (Signed)
Patient discharged to home with home health.  Reviewed discharge instructions, aspiration precaution reccomendations and medication changes with wife and patient who verbalized understanding.  PIV x2 removed with tip intact.

## 2020-06-09 NOTE — TOC Initial Note (Signed)
Transition of Care [facility]) - Initial/Assessment Note    Patient Details  Name: Eric Torres MRN: [id number] Date of Birth: [birth year]  Transition of Care [facility]) CM/SW Contact:    Candie Chroman, LCSW Phone Number: 06/09/2020, 9:42 AM  Clinical Narrative:  CSW met with patient. No supports at bedside. CSW introduced role and explained that PT recommendations would be discussed. Patient agreeable to home health. He worked with [facility] in November and would like to be set up with them again. Left message for [facility] representative. Provided CMS scores for other home health agencies that serve his zip code. Made patient aware of frequent difficulty in setting patients up with home health that have [facility] Medicare. Encouraged him to call his PCP in 1-2 weeks if we are unable to find anyone to accept him to see if staffing is any better. Also offered outpatient therapy. Patient has done this in the past. Patient has a cane, walking stick, rolling walker, and rollator at home. He only uses them outside the home. Patient aware of discharge today and said his wife and daughter will pick him up.                Expected Discharge Plan: [place]     Patient Goals and CMS Choice   CMS Medicare.gov Compare Post Acute Care list provided to:: Patient    Expected Discharge Plan and Services Expected Discharge Plan: [place] Choice: [place] arrangements for the past 2 months: Single Family Home Expected Discharge Date: 06/09/20                                    Prior Living Arrangements/Services Living arrangements for the past 2 months: Single Family Home Lives with:: Spouse, Adult Children Patient language and need for interpreter reviewed:: Yes Do you feel safe going back to the place where you live?: Yes      Need for Family Participation in Patient Care: Yes (Comment) Care giver support system in place?: Yes  (comment) Current home services: DME Criminal Activity/Legal Involvement Pertinent to Current Situation/Hospitalization: No - Comment as needed  Activities of Daily Living Home Assistive Devices/Equipment: None ADL Screening (condition at time of admission) Patient's cognitive ability adequate to safely complete daily activities?: Yes Is the patient deaf or have difficulty hearing?: Yes Does the patient have difficulty seeing, even when wearing glasses/contacts?: No Does the patient have difficulty concentrating, remembering, or making decisions?: No Patient able to express need for assistance with ADLs?: Yes Does the patient have difficulty dressing or bathing?: Yes Independently performs ADLs?: Yes (appropriate for developmental age) Does the patient have difficulty walking or climbing stairs?: Yes Weakness of Legs: Both Weakness of Arms/Hands: Both  Permission Sought/Granted Permission sought to share information with : Facility Art therapist granted to share information with : Yes, Verbal Permission Granted     Permission granted to share info w AGENCY: Home Health Agencies        Emotional Assessment Appearance:: Appears stated age Attitude/Demeanor/Rapport: Engaged, Gracious Affect (typically observed): Accepting, Appropriate, Calm, Pleasant Orientation: : Oriented to Self, Oriented to Place, Oriented to  Time, Oriented to Situation Alcohol / Substance Use: Not Applicable Psych Involvement: No (comment)  Admission diagnosis:  Aspiration pneumonia ([place]) [J69.0] Aspiration pneumonia, unspecified aspiration pneumonia type, unspecified laterality, unspecified part of lung ([place]) [J69.0] Patient  Active Problem List   Diagnosis Date Noted  . Dysphagia 06/07/2020  . Parkinson's disease ([place]) 06/07/2020  . Aspiration pneumonia ([place]) 06/06/2020  . Acute respiratory failure with hypoxia ([place]) 06/06/2020  . HTN (hypertension) 06/06/2020  . History of CVA  (cerebrovascular accident) 06/06/2020  . Hypotension 06/06/2020  . Tremors of nervous system 08/29/2019  . CAP (community acquired pneumonia) 08/28/2019  . Weakness generalized 08/28/2019  . Tremor 08/28/2019   PCP:  Kirk Ruths, MD Pharmacy:   [facility] [address], [place] [address] Phone: [phone] Fax: [phone]     Social Determinants of Health (SDOH) Interventions    Readmission Risk Interventions No flowsheet data found.

## 2020-06-09 NOTE — Discharge Instructions (Signed)
Aspiration Precautions, Adult Aspiration is the breathing in (inhalation) of a liquid or object into the lungs. Things that can be inhaled into the lungs include:  Food.  Any type of liquid, such as drinks or saliva.  Stomach contents, such as vomit or stomach acid. What are the signs of aspiration? Signs of aspiration include:  Coughing after swallowing food or liquids.  Clearing the throat often while eating.  Trouble breathing. This may include: ? Breathing quickly. ? Breathing very slowly. ? Loud breathing. ? Rumbling sounds from the lungs while breathing.  Coughing up phlegm (sputum) that: ? Is yellow, tan, or green. ? Has pieces of food in it. ? Is bad-smelling.  Having a hoarse, barky cough.  Not being able to speak.  A hoarse voice.  Drooling while eating.  A feeling of fullness in the throat or a feeling that something is stuck in the throat.  Choking often.  Having a runny noise while eating.  Coughing when lying down or having to sit up quickly after lying down.  A change in skin color. The skin may look red or blue.  Fever.  Watery eyes.  Pain in the chest or back.  A pained look on the face. What are the complications of aspiration? Complications of aspiration include:  Losing weight because the person is not absorbing needed nutrients.  Loss of enjoyment and the social benefits of eating.  Choking.  Lung irritation, if someone aspirates acidic food or drinks.  Lung infection (pneumonia).  Collection of infected liquid (pus) in the lungs (lung abscess). In serious cases, death can occur. What can I do to prevent aspiration? Caring for someone who has a feeding tube If you are caring for someone who has a feeding tube who cannot eat or drink safely through his or her mouth:  Keep the person in an upright position as much as possible.  Do not lay the person flat if he or she is getting continuous feedings. If you need to lay the  person flat for any reason, turn the feeding pump off.  Check feeding tube residuals as told by your health care provider. Ask your health care provider what residual amount is too high. Caring for someone who can eat and drink safely by mouth If you are caring for someone who can eat and drink safely through his or her mouth:  Have the person sit in an upright position when eating food or drinking fluids. This can be done in two ways: ? Have the person sit up in a chair. ? If sitting in a chair is not possible, position the person in bed so he or she is upright.  Remind the person to eat slowly and chew well. Make sure the person is awake and alert while eating.  Do not distract the person. This is especially important for people with thinking or memory (cognitive) problems.  Allow foods to cool. Hot foods may be more difficult to swallow.  Provide small meals more frequently, instead of 3 large meals. This may reduce fatigue during eating.  Check the person's mouth thoroughly for leftover food after eating.  Keep the person sitting upright for 30-45 minutes after eating.  Do not serve food or drink during 2 hours or more before bedtime. General instructions Follow these general guidelines to prevent aspiration in someone who can eat and drink safely by mouth:  Never put food or liquids in the mouth of a person who is not fully alert.  Feed   small amounts of food. Do not force feed.  For a person who is on a diet for swallowing difficulty (dysphagia diet), follow the recommended food and drink consistency. For example, in dysphagia diet level 1, thicken liquids to pudding-like consistency.  Use as little water as possible when brushing the person's teeth or cleaning his or her mouth.  Provide oral care before and after meals.  Use adaptive devices such as cut-out cups, straws, or utensils as told by the health care provider.  Crush pills and put them in soft food such as pudding  or ice cream. Some pills should not be crushed. Check with the health care provider before crushing any medicine. Contact a health care provider if:  The person has a feeding tube, and the feeding tube residual amount is too high.  The person has a fever.  The person tries to avoid food or water, such as refusing to eat, drink, or be fed, or is eating less than normal.  The person may have aspirated food or liquid.  You notice warning signs, such as choking or coughing, when the person eats or drinks. Get help right away if:  The person has trouble breathing or starts to breathe quickly.  The person is breathing very slowly or stops breathing.  The person coughs a lot after eating or drinking.  The person has a long-lasting (chronic) cough.  The person coughs up thick, yellow, or tan sputum.  If someone is choking on food or an object, perform the Heimlich maneuver (abdominal thrusts).  The person has symptoms of pneumonia, such as: ? Coughing a lot. ? Coughing up mucus with a bad smell or blood in it. ? Feeling short of breath. ? Complaining of chest pain. ? Sweating, fever, and chills. ? Feeling tired. ? Complaining of trouble breathing. ? Wheezing.  The person cannot stop choking.  The person is unable to breathe, turns blue, faints, or seems confused. These symptoms may represent a serious problem that is an emergency. Do not wait to see if the symptoms will go away. Get medical help right away. Call your local emergency services (911 in the [place]  Summary  Aspiration is the breathing in (inhalation) of a liquid or object into the lungs. Things that can be inhaled into the lungs include food, liquids, saliva, or stomach contents.  Aspiration can cause pneumonia or choking.  One sign of aspiration is coughing after swallowing food or liquids.  Contact a health care provider if you notice signs of aspiration. This information is not intended to replace advice given  to you by your health care provider. Make sure you discuss any questions you have with your health care provider. Document Revised: 09/08/2017 Document Reviewed: 06/23/2016 Elsevier Patient Education  [place].   Dysphagia Eating Plan, Bite Size Food This diet plan is for people with moderate swallowing problems who have transitioned from pureed and minced foods. Bite size foods are soft and cut into small chunks so that they can be swallowed safely. On this eating plan, you may be instructed to drink liquids that are thickened. Work with your health care provider and your diet and nutrition specialist (dietitian) to make sure that you are following the diet safely and getting all the nutrients you need. What are tips for following this plan? General guidelines for foods   You may eat foods that are tender, soft, and moist.  Always test food texture before taking a bite. Poke food with a fork or  spoon to make sure it is tender.  Food should be easy to cut and shew. Avoid large pieces of food that require a lot of chewing.  Take small bites. Each bite should be smaller than your thumb nail (about 62mm by 15 mm).  If you were on pureed and minced food diet plans, you may eat any of the foods included in those diets.  Avoid foods that are very dry, hard, sticky, chewy, coarse, or crunchy.  If instructed by your health care provider, thicken liquids. Follow your health care provider's instructions for what products to use, how to do this, and to what thickness. ? Your health care provider may recommend using a commercial thickener, rice cereal, or potato flakes. Ask your health care provider to recommend thickeners. ? Thickened liquids are usually a "pudding-like" consistency, or they may be as thick as honey or thick enough to eat with a spoon. Cooking  To moisten foods, you may add liquids while you are blending, mashing, or grinding your foods to the right consistency. These  liquids include gravies, sauces, vegetable or fruit juice, milk, half and half, or water.  Strain extra liquid from foods before eating.  Reheat foods slowly to prevent a tough crust from forming.  Prepare foods in advance. Meal planning  Eat a variety of foods to get all the nutrients you need.  Some foods may be tolerated better than others. Work with your dietitian to identify which foods are safest for you to eat.  Follow your meal plan as told by your dietitian. What foods are allowed? Grains Moist breads without nuts or seeds. Biscuits, muffins, pancakes, and waffles that are well-moistened with syrup, jelly, margarine, or butter. Cooked cereals. Moist bread stuffing. Moist rice. Well-moistened cold cereal with small chunks. Well-cooked pasta, noodles, rice, and bread dressing in small pieces and thick sauce. Soft dumplings or spaetzle in small pieces and butter or gravy. Vegetables Soft, well-cooked vegetables in small pieces. Soft-cooked, mashed potatoes. Thickened vegetable juice. Fruits Canned or cooked fruits that are soft or moist and do not have skin or seeds. Fresh, soft bananas. Thickened fruit juices. Meat and other protein foods Tender, moist meats or poultry in small pieces. Moist meatballs or meatloaf. Fish without bones. Eggs or egg substitutes in small pieces. Tofu. Tempeh and meat alternatives in small pieces. Well-cooked, tender beans, peas, baked beans, and other legumes. Dairy Thickened milk. Cream cheese. Yogurt. Cottage cheese. Sour cream. Small pieces of soft cheese. Fats and oils Butter. Oils. Margarine. Mayonnaise. Gravy. Spreads. Sweets and desserts Soft, smooth, moist desserts. Pudding. Custard. Moist cakes. Jam. Jelly. Honey. Preserves. Ask your health care provider whether you can have frozen desserts. Seasoning and other foods All seasonings and sweeteners. All sauces with small chunks. Prepared tuna, egg, or chicken salad without raw fruits or  vegetables. Moist casseroles with small, tender pieces of meat. Soups with tender meat. What foods are not allowed? Grains Coarse or dry cereals. Dry breads. Toast. Crackers. Tough, crusty breads, such as [place] bread and baguettes. Dry pancakes, waffles, and muffins. Sticky rice. Dry bread stuffing. Granola. Popcorn. Chips. Vegetables All raw vegetables. Cooked corn. Rubbery or stiff cooked vegetables. Stringy vegetables, such as celery. Tough, crisp fried potatoes. Potato skins. Fruits Hard, crunchy, stringy, high-pulp, and juicy raw fruits such as apples, pineapple, papaya, and watermelon. Small, round fruits, such as grapes. Dried fruit and fruit leather. Meat and other protein foods Large pieces of meat. Dry, tough meats, such as bacon, sausage, and hot dogs.  Chicken, [place], or fish with skin and bones. Crunchy peanut butter. Nuts. Seeds. Nut and seed butters. Dairy Yogurt with nuts, seeds, or large chunks. Large chunks of cheese. Frozen desserts and milk consistency not allowed by your dietitian. Sweets and desserts Dry cakes. Chewy or dry cookies. Any desserts with nuts, seeds, dry fruits, coconut, pineapple, or anything dry, sticky, or hard. Chewy caramel. Licorice. Taffy-type candies. Ask your health care provider whether you can have frozen desserts. Seasoning and other foods Soups with tough or large chunks of meats, poultry, or vegetables. Corn or clam chowder. Smoothies with large chunks of fruit. Summary  Bite size foods can be helpful for people with moderate swallowing problems.  On the dysphagia eating plan, you may eat foods that are soft, moist, and cut into pieces smaller than 18mm by 27mm.  You may be instructed to thicken liquids. Follow your health care provider's instructions about how to do this and to what consistency. This information is not intended to replace advice given to you by your health care provider. Make sure you discuss any questions you have with your  health care provider. Document Revised: 01/17/2019 Document Reviewed: 01/06/2017 Elsevier Patient Education  [place].

## 2020-06-09 NOTE — TOC Transition Note (Signed)
Transition of Care [facility]) - CM/SW Discharge Note   Patient Details  Name: Eric Torres MRN: [id number] Date of Birth: [birth year]  [facility] CM/SW Contact:  Candie Chroman, LCSW Phone Number: 06/09/2020, 11:45 AM   Clinical Narrative: Patient has orders to discharge home today. [facility] is able to accept patient for home health PT and OT with start of care likely over the weekend. Patient is aware and agreeable. No further concerns. CSW signing off.    Final next level of care: Home w Home Health Services Barriers to Discharge: Barriers Resolved   Patient Goals and CMS Choice   CMS Medicare.gov Compare Post Acute Care list provided to:: Patient Choice offered to / list presented to : Patient  Discharge Placement                Patient to be transferred to facility by: Family will take him home.   Patient and family notified of of transfer: 06/09/20  Discharge Plan and Services     Post Acute Care Choice: [place] Arranged: PT, OT [place] Agency: [facility] ([facility]) Date [place]: 06/09/20   Representative spoke with at [place]: [place] (SDOH) Interventions     Readmission Risk Interventions No flowsheet data found.

## 2020-06-24 ENCOUNTER — Ambulatory Visit (INDEPENDENT_AMBULATORY_CARE_PROVIDER_SITE_OTHER): Payer: Medicare Other | Admitting: Gastroenterology

## 2020-06-24 ENCOUNTER — Other Ambulatory Visit: Payer: Self-pay

## 2020-06-24 ENCOUNTER — Encounter: Payer: Self-pay | Admitting: Gastroenterology

## 2020-06-24 VITALS — BP 103/65 | HR 77 | Ht 67.0 in | Wt 187.4 lb

## 2020-06-24 DIAGNOSIS — R1312 Dysphagia, oropharyngeal phase: Secondary | ICD-10-CM

## 2020-06-24 MED ORDER — PANTOPRAZOLE SODIUM 40 MG PO TBEC
40.0000 mg | DELAYED_RELEASE_TABLET | Freq: Every day | ORAL | 6 refills | Status: DC
Start: 2020-06-24 — End: 2020-09-18

## 2020-06-24 NOTE — Progress Notes (Signed)
Gastroenterology Consultation  Referring Provider:     Kirk Ruths, MD Primary Care Physician:  Kirk Ruths, MD Primary Gastroenterologist:  Dr. Allen Norris     Reason for Consultation:     Dysphagia        HPI:   Eric Torres is a 86 y.o. y/o male referred for consultation & management of dysphagia by Dr. Ouida Sills, Ocie Cornfield, MD.  This patient comes in today after being seen in the past by Dr. Gustavo Lah for dysphagia.  The patient had an upper GI series done in 2020 that showed:  IMPRESSION: 1. The initial oropharyngeal swallows are normal, however following multiple swallows of thick barium, small volume silent aspiration is noted and no further barium was administered.  2. Severe, persistent esophageal dysmotility noted with all swallows. The gastroesophageal sphincter is not clearly observed to relax, however a swallowed barium tablet passes readily into the stomach. Lower esophageal mass is not excluded. Consider endoscopic evaluation if not already performed.  The patient then underwent an EGD by Dr. Gustavo Lah that was reported to show:  - Abnormal esophageal motility, consistent with presbyesophagus. - LA Grade A esophagitis. Biopsied. - Erosive gastritis. Biopsied. - Normal examined duodenum.  The patient comes with his daughter today who reports that they were wondering if the patient needed to be given an upper endoscopy for possible stricture or if the patient needed the protonic since they are not sure why he was taking the Protonix.  The daughter thought it was given tohelp his swallowing.   Past Medical History:  Diagnosis Date  . Bilateral foot pain    LIKELY NEUROPATHY  . BPH (benign prostatic hyperplasia)   . Cancer (HCC)    ,HX OF PROSTATE CANCER AND BLADDER CANCER  . Collagenous colitis   . COPD (chronic obstructive pulmonary disease) (HCC)    MILD-NO INHALERS PER PT  . Essential tremor   . Frequent falls   . Heart murmur   . History  of brachytherapy   . History of TIA (transient ischemic attack) 2015   LEFT THUMB AND INDEX FINGER-SOMETIMES I DROP THINGS  . Hypercholesteremia   . Hypertension   . Hypothyroidism   . PVD (peripheral vascular disease) ([place])   . Stroke ([place])   . Vitamin D deficiency     Past Surgical History:  Procedure Laterality Date  . CHOLECYSTECTOMY    . CYSTOSCOPY WITH BIOPSY N/A 12/29/2015   Procedure: CYSTOSCOPY WITH BIOPSY;  Surgeon: Royston Cowper, MD;  Location: [facility] ORS;  Service: Urology;  Laterality: N/A;  . DIAGNOSTIC LAPAROSCOPY    . ESOPHAGOGASTRODUODENOSCOPY (EGD) WITH PROPOFOL N/A 02/15/2019   Procedure: ESOPHAGOGASTRODUODENOSCOPY (EGD) WITH PROPOFOL;  Surgeon: Lollie Sails, MD;  Location: [facility] ENDOSCOPY;  Service: Endoscopy;  Laterality: N/A;  . EXPLORATORY LAPAROTOMY  1991   VOLVULUS AND BOWEL OBSTRUCTION  . HERNIA REPAIR    . ILIAC ARTERY STENT Bilateral   . TONSILLECTOMY    . TRANSURETHRAL RESECTION OF BLADDER TUMOR N/A 12/29/2015   Procedure: TRANSURETHRAL RESECTION OF BLADDER TUMOR (TURBT)/MITOMYCIN INSTILLATION;  Surgeon: Royston Cowper, MD;  Location: [facility] ORS;  Service: Urology;  Laterality: N/A;  . TRANSURETHRAL RESECTION OF BLADDER TUMOR WITH GYRUS (TURBT-GYRUS)  2013  . VASECTOMY      Prior to Admission medications   Medication Sig Start Date End Date Taking? Authorizing Provider  acetaminophen (TYLENOL) 500 MG tablet Take 500 mg by mouth every 6 (six) hours as needed.    [provider]  aspirin 81 MG tablet Take 81 mg by mouth daily.    [provider]  atorvastatin (LIPITOR) 80 MG tablet Take 80 mg by mouth daily at 6 PM.    [provider]  finasteride (PROSCAR) 5 MG tablet Take 5 mg by mouth at bedtime.    [provider]  gabapentin (NEURONTIN) 100 MG capsule Take 100 mg by mouth 2 (two) times daily.    [provider]  levothyroxine (SYNTHROID, LEVOTHROID) 112 MCG tablet Take 112 mcg by mouth at bedtime.      [provider]  Nutritional Supplements (BLADDER 2.2) TABS Take 1 tablet by mouth 2 (two) times daily.     [provider]  pantoprazole (PROTONIX) 40 MG tablet Take 40 mg by mouth daily with lunch.     [provider]  primidone (MYSOLINE) 250 MG tablet Take 250 mg by mouth 2 (two) times daily with a meal.    [provider]  propranolol (INDERAL) 20 MG tablet Take 20 mg by mouth 2 (two) times daily.     [provider]  rOPINIRole (REQUIP) 0.25 MG tablet Take 2 tablets by mouth in the morning, at noon, and at bedtime. Patient not taking: Reported on 06/06/2020 05/14/20 06/13/20  [provider]  senna-docusate (SENOKOT-S) 8.6-50 MG tablet Take 2 tablets by mouth at bedtime as needed for mild constipation or moderate constipation. 06/09/20   Amin, Jeanella Flattery, MD  tamsulosin (FLOMAX) 0.4 MG CAPS capsule Take 0.4 mg by mouth daily after supper.    [provider]    Family History  Family history unknown: Yes     Social History   Tobacco Use  . Smoking status: Former Smoker    Packs/day: 0.50    Years: 35.00    Pack years: 17.50    Types: Cigarettes    Quit date: 12/23/2011    Years since quitting: 8.5  . Smokeless tobacco: Never Used  Vaping Use  . Vaping Use: Never used  Substance Use Topics  . Alcohol use: Yes    Comment: OCC  . Drug use: No    Allergies as of 06/24/2020  . (No Known Allergies)    Review of Systems:    All systems reviewed and negative except where noted in HPI.   Physical Exam:  There were no vitals taken for this visit. No LMP for male patient. General:   Alert,  Well-developed, well-nourished, pleasant and cooperative in NAD Head:  Normocephalic and atraumatic. Eyes:  Sclera clear, no icterus.   Conjunctiva pink. Ears:  Normal auditory acuity. Neck:  Supple; no masses or thyromegaly. Lungs:  Respirations even and unlabored.  Clear throughout to auscultation.   No wheezes, crackles, or  rhonchi. No acute distress. Heart:  Regular rate and rhythm; no murmurs, clicks, rubs, or gallops. Abdomen:  Normal bowel sounds.  No bruits.  Soft, non-tender and non-distended without masses, hepatosplenomegaly or hernias noted.  No guarding or rebound tenderness.  Negative Carnett sign.   Rectal:  Deferred.  Pulses:  Normal pulses noted. Extremities:  No clubbing or edema.  No cyanosis. Neurologic:  Alert and oriented x3;  grossly normal neurologically. Skin:  Intact without significant lesions or rashes.  No jaundice. Lymph Nodes:  No significant cervical adenopathy. Psych:  Alert and cooperative. Normal mood and affect.  Imaging Studies: DG Chest Portable 1 View  Result Date: 06/06/2020 CLINICAL DATA:  Aspiration EXAM: PORTABLE CHEST 1 VIEW COMPARISON:  Chest radiograph dated 08/28/2019 FINDINGS: The heart  size and mediastinal contours are within normal limits. There is questionable mild left infrahilar airspace opacity. The right lung is clear. There is no pleural effusion or pneumothorax. The visualized skeletal structures are unremarkable. IMPRESSION: Questionable mild left infrahilar airspace opacity could represent early pneumonia. Electronically Signed   By: Zerita Boers M.D.   On: 06/06/2020 19:58    Assessment and Plan:   Eric Torres is a 86 y.o. y/o male who comes in today with a history of dysphagia with a workup in the past showing decreased esophageal motility with silent aspiration and a recent admission for aspiration pneumonia. The patient and his daughter were explained the results of all of the previous procedures and imaging. The patient and his daughter were told that this is not an obstructive problem but a problem with aspiration likely due to a combination of his swallowing mechanism and decrease esophageal motility.  All the patient's and his daughter's questions were answered and they were told to follow up with speech pathology for instructions on the safest way  to eat.  He has also been told that if his bleeding continues to be a problem with aspiration he may need a feeding tube as an option for continued nutrition.  The patient has also been told that the Protonix will decrease the acid so that if he refluxes and aspirates it will not be acidic which could do more damage and his Protonix will be renewed.The patient and his daughter have been explained the plan and agree with it.    Lucilla Lame, MD. Mar[facility] Regal    Note: This dictation was prepared with Dragon dictation along with smaller phrase technology. Any transcriptional errors that result from this process are unintentional.

## 2020-09-18 ENCOUNTER — Other Ambulatory Visit: Payer: Self-pay

## 2020-09-18 MED ORDER — PANTOPRAZOLE SODIUM 40 MG PO TBEC
40.0000 mg | DELAYED_RELEASE_TABLET | Freq: Every day | ORAL | 3 refills | Status: DC
Start: 2020-09-18 — End: 2021-06-28

## 2020-11-29 ENCOUNTER — Emergency Department: Payer: Medicare Other

## 2020-11-29 ENCOUNTER — Emergency Department
Admission: EM | Admit: 2020-11-29 | Discharge: 2020-11-29 | Disposition: A | Discharge status: Home / Self Care | Payer: Medicare Other | Attending: Student in an Organized Health Care Education/Training Program | Admitting: Student in an Organized Health Care Education/Training Program

## 2020-11-29 ENCOUNTER — Other Ambulatory Visit: Payer: Self-pay

## 2020-11-29 DIAGNOSIS — S0081XA Abrasion of other part of head, initial encounter: Secondary | ICD-10-CM | POA: Diagnosis not present

## 2020-11-29 DIAGNOSIS — I6350 Cerebral infarction due to unspecified occlusion or stenosis of unspecified cerebral artery: Secondary | ICD-10-CM | POA: Insufficient documentation

## 2020-11-29 DIAGNOSIS — J449 Chronic obstructive pulmonary disease, unspecified: Secondary | ICD-10-CM | POA: Diagnosis not present

## 2020-11-29 DIAGNOSIS — R296 Repeated falls: Secondary | ICD-10-CM | POA: Insufficient documentation

## 2020-11-29 DIAGNOSIS — W19XXXA Unspecified fall, initial encounter: Secondary | ICD-10-CM | POA: Insufficient documentation

## 2020-11-29 DIAGNOSIS — G20 Parkinson's disease: Secondary | ICD-10-CM | POA: Insufficient documentation

## 2020-11-29 DIAGNOSIS — Z8546 Personal history of malignant neoplasm of prostate: Secondary | ICD-10-CM | POA: Diagnosis not present

## 2020-11-29 DIAGNOSIS — Z8551 Personal history of malignant neoplasm of bladder: Secondary | ICD-10-CM | POA: Insufficient documentation

## 2020-11-29 DIAGNOSIS — S0990XA Unspecified injury of head, initial encounter: Secondary | ICD-10-CM | POA: Diagnosis present

## 2020-11-29 DIAGNOSIS — Z87891 Personal history of nicotine dependence: Secondary | ICD-10-CM | POA: Diagnosis not present

## 2020-11-29 DIAGNOSIS — Z79899 Other long term (current) drug therapy: Secondary | ICD-10-CM | POA: Diagnosis not present

## 2020-11-29 DIAGNOSIS — Z8673 Personal history of transient ischemic attack (TIA), and cerebral infarction without residual deficits: Secondary | ICD-10-CM | POA: Diagnosis not present

## 2020-11-29 DIAGNOSIS — Z7982 Long term (current) use of aspirin: Secondary | ICD-10-CM | POA: Diagnosis not present

## 2020-11-29 DIAGNOSIS — R531 Weakness: Secondary | ICD-10-CM | POA: Diagnosis not present

## 2020-11-29 DIAGNOSIS — I110 Hypertensive heart disease with heart failure: Secondary | ICD-10-CM | POA: Diagnosis not present

## 2020-11-29 DIAGNOSIS — I509 Heart failure, unspecified: Secondary | ICD-10-CM | POA: Insufficient documentation

## 2020-11-29 DIAGNOSIS — N3000 Acute cystitis without hematuria: Secondary | ICD-10-CM | POA: Diagnosis not present

## 2020-11-29 DIAGNOSIS — I739 Peripheral vascular disease, unspecified: Secondary | ICD-10-CM | POA: Diagnosis not present

## 2020-11-29 DIAGNOSIS — Z95828 Presence of other vascular implants and grafts: Secondary | ICD-10-CM | POA: Diagnosis not present

## 2020-11-29 LAB — COMPREHENSIVE METABOLIC PANEL
ALT: 39 U/L (ref 0–44)
AST: 30 U/L (ref 15–41)
Albumin: 3.4 g/dL — ABNORMAL LOW (ref 3.5–5.0)
Alkaline Phosphatase: 61 U/L (ref 38–126)
Anion gap: 8 (ref 5–15)
BUN: 21 mg/dL (ref 8–23)
CO2: 27 mmol/L (ref 22–32)
Calcium: 8.3 mg/dL — ABNORMAL LOW (ref 8.9–10.3)
Chloride: 100 mmol/L (ref 98–111)
Creatinine, Ser: 1.02 mg/dL (ref 0.61–1.24)
GFR, Estimated: >60 mL/min (ref >60)
Glucose, Bld: 116 mg/dL — ABNORMAL HIGH (ref 70–99)
Potassium: 4.1 mmol/L (ref 3.5–5.1)
Sodium: 135 mmol/L (ref 135–145)
Total Bilirubin: 0.6 mg/dL (ref 0.3–1.2)
Total Protein: 6.5 g/dL (ref 6.5–8.1)

## 2020-11-29 LAB — URINALYSIS, COMPLETE (UACMP) WITH MICROSCOPIC
Bilirubin Urine: NEGATIVE
Glucose, UA: NEGATIVE mg/dL
Hgb urine dipstick: NEGATIVE
Ketones, ur: NEGATIVE mg/dL
Nitrite: POSITIVE — AB
Protein, ur: NEGATIVE mg/dL
Specific Gravity, Urine: 1.011 (ref 1.005–1.030)
Squamous Epithelial / HPF: NONE SEEN (ref 0–5)
WBC, UA: >50 WBC/hpf — ABNORMAL HIGH (ref 0–5)
pH: 5 (ref 5.0–8.0)

## 2020-11-29 LAB — CBC WITH DIFFERENTIAL/PLATELET
Abs Immature Granulocytes: 0.04 10*3/uL (ref 0.00–0.07)
Basophils Absolute: 0 10*3/uL (ref 0.0–0.1)
Basophils Relative: 0 %
Eosinophils Absolute: 0 10*3/uL (ref 0.0–0.5)
Eosinophils Relative: 0 %
HCT: 42.4 % (ref 39.0–52.0)
Hemoglobin: 14.9 g/dL (ref 13.0–17.0)
Immature Granulocytes: 0 %
Lymphocytes Relative: 8 %
Lymphs Abs: 0.8 10*3/uL (ref 0.7–4.0)
MCH: 34.1 pg — ABNORMAL HIGH (ref 26.0–34.0)
MCHC: 35.1 g/dL (ref 30.0–36.0)
MCV: 97 fL (ref 80.0–100.0)
Monocytes Absolute: 1 10*3/uL (ref 0.1–1.0)
Monocytes Relative: 9 %
Neutro Abs: 9.3 10*3/uL — ABNORMAL HIGH (ref 1.7–7.7)
Neutrophils Relative %: 83 %
Platelets: 203 10*3/uL (ref 150–400)
RBC: 4.37 MIL/uL (ref 4.22–5.81)
RDW: 13.1 % (ref 11.5–15.5)
WBC: 11.2 10*3/uL — ABNORMAL HIGH (ref 4.0–10.5)
nRBC: 0 % (ref 0.0–0.2)

## 2020-11-29 LAB — MAGNESIUM: Magnesium: 2.2 mg/dL (ref 1.7–2.4)

## 2020-11-29 LAB — TROPONIN I (HIGH SENSITIVITY): Troponin I (High Sensitivity): 14 ng/L (ref <18)

## 2020-11-29 MED ORDER — SODIUM CHLORIDE 0.9 % IV SOLN
1.0000 g | Freq: Once | INTRAVENOUS | Status: AC
  Administered 2020-11-29: 1 g via INTRAVENOUS
  Filled 2020-11-29: qty 10

## 2020-11-29 MED ORDER — CEPHALEXIN 500 MG PO CAPS
500.0000 mg | ORAL_CAPSULE | Freq: Four times a day (QID) | ORAL | 0 refills | Status: AC
Start: 2020-11-29 — End: 2020-12-06

## 2020-11-29 NOTE — ED Triage Notes (Signed)
Pt presents via EMS c/o multiple falls at home. Lives with family. EMS reports small abrasion to head from fall yesterday. Denies anticoagulation. EMS report A&Ox4.

## 2020-11-30 NOTE — ED Provider Notes (Signed)
[facility] Emergency Department Provider Note  ____________________________________________   Event Date/Time   First MD Initiated Contact with Patient 11/29/20 1102     (approximate)  I have reviewed the triage vital signs and the nursing notes.   HISTORY  Chief Complaint Fall  HPI Eric Torres is a 87 y.o. male reports to the emergency department today for evaluation multiple falls in the last few days.  He initially describes these as mechanical, during times at which he did not keep his walker nearby.  His wife joined shortly after he was roomed, and added additional history.  Patient has apparently been having a lot of falls in the last 6 to 12 months that has been evaluated multiple times by neurology.  He has been treated for benign essential tremor as well as with some Parkinson's medications, but reportedly without an official Parkinson's diagnosis.  Wife states that he has fallen multiple times since this past Thursday, and seems to have acutely weakened since that time.  Patient does report that he sustained a small abrasion to the left side of his head during fall yesterday.  Denies any current headache, blurred vision, neck pain, nausea or vomiting.  Denies any abdominal pain, dysuria, frequency, fever or other symptoms.  Reports to the ER today because the fire department had to assist him up twice today with repeated falls, and they encouraged him to be evaluated.         Past Medical History:  Diagnosis Date  . Bilateral foot pain    LIKELY NEUROPATHY  . BPH (benign prostatic hyperplasia)   . Cancer (HCC)    ,HX OF PROSTATE CANCER AND BLADDER CANCER  . Collagenous colitis   . COPD (chronic obstructive pulmonary disease) (HCC)    MILD-NO INHALERS PER PT  . Essential tremor   . Frequent falls   . Heart murmur   . History of brachytherapy   . History of TIA (transient ischemic attack) 2015   LEFT THUMB AND INDEX FINGER-SOMETIMES I DROP  THINGS  . Hypercholesteremia   . Hypertension   . Hypothyroidism   . PVD (peripheral vascular disease) ([place])   . Stroke ([place])   . Vitamin D deficiency     Patient Active Problem List   Diagnosis Date Noted  . Dysphagia 06/07/2020  . Parkinson's disease ([place]) 06/07/2020  . Aspiration pneumonia ([place]) 06/06/2020  . Acute respiratory failure with hypoxia ([place]) 06/06/2020  . HTN (hypertension) 06/06/2020  . History of CVA (cerebrovascular accident) 06/06/2020  . Hypotension 06/06/2020  . Tremors of nervous system 08/29/2019  . CAP (community acquired pneumonia) 08/28/2019  . Weakness generalized 08/28/2019  . Tremor 08/28/2019    Past Surgical History:  Procedure Laterality Date  . CHOLECYSTECTOMY    . CYSTOSCOPY WITH BIOPSY N/A 12/29/2015   Procedure: CYSTOSCOPY WITH BIOPSY;  Surgeon: Royston Cowper, MD;  Location: [facility] ORS;  Service: Urology;  Laterality: N/A;  . DIAGNOSTIC LAPAROSCOPY    . ESOPHAGOGASTRODUODENOSCOPY (EGD) WITH PROPOFOL N/A 02/15/2019   Procedure: ESOPHAGOGASTRODUODENOSCOPY (EGD) WITH PROPOFOL;  Surgeon: Lollie Sails, MD;  Location: [facility] ENDOSCOPY;  Service: Endoscopy;  Laterality: N/A;  . EXPLORATORY LAPAROTOMY  1991   VOLVULUS AND BOWEL OBSTRUCTION  . HERNIA REPAIR    . ILIAC ARTERY STENT Bilateral   . TONSILLECTOMY    . TRANSURETHRAL RESECTION OF BLADDER TUMOR N/A 12/29/2015   Procedure: TRANSURETHRAL RESECTION OF BLADDER TUMOR (TURBT)/MITOMYCIN INSTILLATION;  Surgeon: Royston Cowper, MD;  Location: [facility] ORS;  Service: Urology;  Laterality: N/A;  . TRANSURETHRAL RESECTION OF BLADDER TUMOR WITH GYRUS (TURBT-GYRUS)  2013  . VASECTOMY      Prior to Admission medications   Medication Sig Start Date End Date Taking? Authorizing Provider  cephALEXin (KEFLEX) 500 MG capsule Take 1 capsule (500 mg total) by mouth 4 (four) times daily for 7 days. 11/29/20 12/06/20 Yes Marlana Salvage, PA  acetaminophen (TYLENOL) 500 MG tablet Take 500 mg by mouth every 6  (six) hours as needed.    [provider]  aspirin 81 MG tablet Take 81 mg by mouth daily.    [provider]  atorvastatin (LIPITOR) 80 MG tablet Take 80 mg by mouth daily at 6 PM.    [provider]  finasteride (PROSCAR) 5 MG tablet Take 5 mg by mouth at bedtime.    [provider]  gabapentin (NEURONTIN) 100 MG capsule Take 100 mg by mouth 2 (two) times daily.    [provider]  levothyroxine (SYNTHROID, LEVOTHROID) 112 MCG tablet Take 112 mcg by mouth at bedtime.     [provider]  Nutritional Supplements (BLADDER 2.2) TABS Take 1 tablet by mouth 2 (two) times daily.     [provider]  pantoprazole (PROTONIX) 40 MG tablet Take 1 tablet (40 mg total) by mouth daily. 09/18/20   Lucilla Lame, MD  primidone (MYSOLINE) 250 MG tablet Take 250 mg by mouth 2 (two) times daily with a meal.    [provider]  propranolol (INDERAL) 20 MG tablet Take 20 mg by mouth 2 (two) times daily.     [provider]  rOPINIRole (REQUIP) 0.25 MG tablet Take 2 tablets by mouth in the morning, at noon, and at bedtime. Patient not taking: Reported on 06/06/2020 05/14/20 06/13/20  [provider]  senna-docusate (SENOKOT-S) 8.6-50 MG tablet Take 2 tablets by mouth at bedtime as needed for mild constipation or moderate constipation. 06/09/20   Amin, Jeanella Flattery, MD  tamsulosin (FLOMAX) 0.4 MG CAPS capsule Take 0.4 mg by mouth daily after supper.    [provider]    Allergies Patient has no known allergies.  Family History  Family history unknown: Yes    Social History Social History   Tobacco Use  . Smoking status: Former Smoker    Packs/day: 0.50    Years: 35.00    Pack years: 17.50    Types: Cigarettes    Quit date: 12/23/2011    Years since quitting: 8.9  . Smokeless tobacco: Never Used  Vaping Use  . Vaping Use: Never used  Substance Use Topics  . Alcohol use: Yes    Comment: OCC  . Drug use: No     Review of Systems Constitutional: + Generalized weakness, no fever/chills Eyes: No visual changes. ENT: No sore throat. Cardiovascular: Denies chest pain. Respiratory: Denies shortness of breath. Gastrointestinal: No abdominal pain.  No nausea, no vomiting.  No diarrhea.  No constipation. Genitourinary: Negative for dysuria. Musculoskeletal: Negative for back pain. Skin: Negative for rash. Neurological: + Frequent falls, negative for headaches, focal weakness or numbness.  ____________________________________________   PHYSICAL EXAM:  VITAL SIGNS: ED Triage Vitals  Enc Vitals Group     BP 11/29/20 0936 103/60     Pulse Rate 11/29/20 0936 81     Resp 11/29/20 0936 18     Temp 11/29/20 0936 98.4 F (36.9 C)     Temp Source 11/29/20 0936 Oral     SpO2 11/29/20 0936 96 %  Weight 11/29/20 0935 187 lb (84.8 kg)     Height 11/29/20 0935 5\' 8"  (1.727 m)     Head Circumference --      Peak Flow --      Pain Score 11/29/20 0932 0     Pain Loc --      Pain Edu? --      Excl. in [place]? --    Constitutional: Alert and oriented. Well appearing and in no acute distress. Eyes: Conjunctivae are normal. PERRL. EOMI. Head: Small abrasion noted to the left side of the face, just superior to the lateral aspect of the left eyebrow.  No surrounding soft tissue swelling or edema.  Otherwise head appears atraumatic. Nose: No congestion/rhinnorhea. Mouth/Throat: Mucous membranes are moist.  Neck: No stridor.  No tenderness to palpation of the midline or paraspinals of the cervical spine, full range of motion. Cardiovascular: Normal rate, regular rhythm. Grossly normal heart sounds.   Respiratory: Normal respiratory effort.  No retractions. Lungs CTAB. Gastrointestinal: Soft and nontender. No distention.  No CVA tenderness. Musculoskeletal: There is no tenderness to palpation of the upper or lower extremities.  Equal range of motion bilaterally of the shoulders, elbows and wrists.  Negative  logroll of the bilateral hips.  No tenderness to palpation of the midline of the thoracic or lumbar spines, no step-off deformities noted. Neurologic:  Normal speech and language.  Cranial nerves II through XII grossly intact, No gross focal neurologic deficits are appreciated.  Gait not assessed secondary to fall risk. Skin:  Skin is warm, dry and intact except facial abrasion as described above. No rash noted. Psychiatric: Mood and affect are normal. Speech and behavior are normal.  ____________________________________________   LABS (all labs ordered are listed, but only abnormal results are displayed)  Labs Reviewed  URINE CULTURE - Abnormal; Notable for the following components:      Result Value   Culture   (*)    Value: >=100,000 COLONIES/mL GRAM NEGATIVE RODS SUSCEPTIBILITIES TO FOLLOW Performed at [facility], [place] [address] [place], [place] [address]    All other components within normal limits  CBC WITH DIFFERENTIAL/PLATELET - Abnormal; Notable for the following components:   WBC 11.2 (*)    MCH 34.1 (*)    Neutro Abs 9.3 (*)    All other components within normal limits  COMPREHENSIVE METABOLIC PANEL - Abnormal; Notable for the following components:   Glucose, Bld 116 (*)    Calcium 8.3 (*)    Albumin 3.4 (*)    All other components within normal limits  URINALYSIS, COMPLETE (UACMP) WITH MICROSCOPIC - Abnormal; Notable for the following components:   Color, Urine YELLOW (*)    APPearance HAZY (*)    Nitrite POSITIVE (*)    Leukocytes,Ua MODERATE (*)    WBC, UA >50 (*)    Bacteria, UA MANY (*)    All other components within normal limits  MAGNESIUM  TROPONIN I (HIGH SENSITIVITY)   ____________________________________________  EKG  Normal sinus rhythm with occasional PAC, with rate of 76 bpm.  No ST-T segment elevations or depressions, no T wave inversions.  No evidence of acute ischemia. ____________________________________________  RADIOLOGY I,  Marlana Salvage, personally viewed and evaluated these images (plain radiographs) as part of my medical decision making, as well as reviewing the written report by the radiologist.  ED provider interpretation: Chest x-ray demonstrates no acute intrathoracic pathology; see radiology report for CT findings  ____________________________________________   INITIAL IMPRESSION / ASSESSMENT  AND PLAN / ED COURSE  As part of my medical decision making, I reviewed the following data within the [place] History obtained from family, Nursing notes reviewed and incorporated, Labs reviewed, Radiograph reviewed and Notes from prior ED visits        Patient is an 87 year old male who reports via EMS for evaluation with wife at bedside for increased falls over the last several days.  See HPI for further details.  In triage, the patient has normal vital signs.  On physical exam, the patient does have a small abrasion noted to the face, otherwise no significant positive findings.  Given the increased falls and complaint of weakness, as well as the fact that he hit his head during one of the falls, work-up was begun.  CT was obtained of the neck, face and head.  There is no acute abnormality noted.  Chest x-ray was also obtained which is negative for acute process.  EKG is grossly within normal limits except for occasional PAC.  CBC does demonstrate a mildly elevated white count at 11.2, no other significant dyscrasias.  CMP is grossly within normal limits for age.  Urinalysis does demonstrate positive nitrites, moderate number of leukocytes, greater than 50 white cells and many bacteria.  There is no noted hemoglobin or red blood cells.  Magnesium and troponin are within normal limits.  Given work-up, as well as physical exam the findings are most consistent with UTI.  Urine culture will be sent.  Patient does not have significant history for UTI, however given this is an male, will treat as complex  UTI will begin with 1 g of IV Rocephin and follow with Keflex.  Patient was urged to follow-up with primary care.  He and wife state that he feels safe to go home.  Patient and wife are amenable with this plan, he stable this time for outpatient therapy.      ____________________________________________   FINAL CLINICAL IMPRESSION(S) / ED DIAGNOSES  Final diagnoses:  Acute cystitis without hematuria  Fall, initial encounter  Minor head injury, initial encounter     ED Discharge Orders         Ordered    cephALEXin (KEFLEX) 500 MG capsule  4 times daily        11/29/20 1354          *Please note:  Eric Torres was evaluated in Emergency Department on 11/30/2020 for the symptoms described in the history of present illness. He was evaluated in the context of the global COVID-19 pandemic, which necessitated consideration that the patient might be at risk for infection with the SARS-CoV-2 virus that causes COVID-19. Institutional protocols and algorithms that pertain to the evaluation of patients at risk for COVID-19 are in a state of rapid change based on information released by regulatory bodies including the CDC and federal and state organizations. These policies and algorithms were followed during the patient's care in the ED.  Some ED evaluations and interventions may be delayed as a result of limited staffing during and the pandemic.*   Note:  This document was prepared using Dragon voice recognition software and may include unintentional dictation errors.   Marlana Salvage, PA 11/30/20 1817    Merlyn Lot, MD 11/30/20 2133

## 2020-12-01 LAB — URINE CULTURE: Culture: >=100000 — AB

## 2021-03-02 ENCOUNTER — Other Ambulatory Visit: Payer: Self-pay

## 2021-03-02 ENCOUNTER — Emergency Department
Admission: EM | Admit: 2021-03-02 | Discharge: 2021-03-02 | Disposition: A | Discharge status: Home / Self Care | Payer: Medicare Other | Attending: Emergency Medicine | Admitting: Emergency Medicine

## 2021-03-02 ENCOUNTER — Emergency Department: Payer: Medicare Other

## 2021-03-02 DIAGNOSIS — N390 Urinary tract infection, site not specified: Secondary | ICD-10-CM | POA: Diagnosis not present

## 2021-03-02 DIAGNOSIS — G20 Parkinson's disease: Secondary | ICD-10-CM | POA: Diagnosis not present

## 2021-03-02 DIAGNOSIS — Z8551 Personal history of malignant neoplasm of bladder: Secondary | ICD-10-CM | POA: Insufficient documentation

## 2021-03-02 DIAGNOSIS — B9620 Unspecified Escherichia coli [E. coli] as the cause of diseases classified elsewhere: Secondary | ICD-10-CM | POA: Diagnosis not present

## 2021-03-02 DIAGNOSIS — J449 Chronic obstructive pulmonary disease, unspecified: Secondary | ICD-10-CM | POA: Diagnosis not present

## 2021-03-02 DIAGNOSIS — I10 Essential (primary) hypertension: Secondary | ICD-10-CM | POA: Diagnosis not present

## 2021-03-02 DIAGNOSIS — R509 Fever, unspecified: Secondary | ICD-10-CM | POA: Diagnosis present

## 2021-03-02 DIAGNOSIS — E039 Hypothyroidism, unspecified: Secondary | ICD-10-CM | POA: Insufficient documentation

## 2021-03-02 DIAGNOSIS — Z79899 Other long term (current) drug therapy: Secondary | ICD-10-CM | POA: Diagnosis not present

## 2021-03-02 DIAGNOSIS — R791 Abnormal coagulation profile: Secondary | ICD-10-CM | POA: Diagnosis not present

## 2021-03-02 DIAGNOSIS — Z87891 Personal history of nicotine dependence: Secondary | ICD-10-CM | POA: Insufficient documentation

## 2021-03-02 DIAGNOSIS — Z8546 Personal history of malignant neoplasm of prostate: Secondary | ICD-10-CM | POA: Insufficient documentation

## 2021-03-02 DIAGNOSIS — Z7982 Long term (current) use of aspirin: Secondary | ICD-10-CM | POA: Diagnosis not present

## 2021-03-02 LAB — CBC WITH DIFFERENTIAL/PLATELET
Abs Immature Granulocytes: 0.04 10*3/uL (ref 0.00–0.07)
Basophils Absolute: 0 10*3/uL (ref 0.0–0.1)
Basophils Relative: 0 %
Eosinophils Absolute: 0.1 10*3/uL (ref 0.0–0.5)
Eosinophils Relative: 1 %
HCT: 43.1 % (ref 39.0–52.0)
Hemoglobin: 15.2 g/dL (ref 13.0–17.0)
Immature Granulocytes: 0 %
Lymphocytes Relative: 9 %
Lymphs Abs: 0.8 10*3/uL (ref 0.7–4.0)
MCH: 33.8 pg (ref 26.0–34.0)
MCHC: 35.3 g/dL (ref 30.0–36.0)
MCV: 95.8 fL (ref 80.0–100.0)
Monocytes Absolute: 1.2 10*3/uL — ABNORMAL HIGH (ref 0.1–1.0)
Monocytes Relative: 13 %
Neutro Abs: 6.9 10*3/uL (ref 1.7–7.7)
Neutrophils Relative %: 77 %
Platelets: 166 10*3/uL (ref 150–400)
RBC: 4.5 MIL/uL (ref 4.22–5.81)
RDW: 12.9 % (ref 11.5–15.5)
WBC: 9.1 10*3/uL (ref 4.0–10.5)
nRBC: 0 % (ref 0.0–0.2)

## 2021-03-02 LAB — URINALYSIS, COMPLETE (UACMP) WITH MICROSCOPIC
Bilirubin Urine: NEGATIVE
Glucose, UA: NEGATIVE mg/dL
Ketones, ur: NEGATIVE mg/dL
Nitrite: NEGATIVE
Protein, ur: 30 mg/dL — AB
Specific Gravity, Urine: 1.013 (ref 1.005–1.030)
Squamous Epithelial / HPF: NONE SEEN (ref 0–5)
WBC, UA: >50 WBC/hpf — ABNORMAL HIGH (ref 0–5)
pH: 5 (ref 5.0–8.0)

## 2021-03-02 LAB — COMPREHENSIVE METABOLIC PANEL
ALT: 31 U/L (ref 0–44)
AST: 33 U/L (ref 15–41)
Albumin: 3.3 g/dL — ABNORMAL LOW (ref 3.5–5.0)
Alkaline Phosphatase: 33 U/L — ABNORMAL LOW (ref 38–126)
Anion gap: 10 (ref 5–15)
BUN: 21 mg/dL (ref 8–23)
CO2: 26 mmol/L (ref 22–32)
Calcium: 8.3 mg/dL — ABNORMAL LOW (ref 8.9–10.3)
Chloride: 95 mmol/L — ABNORMAL LOW (ref 98–111)
Creatinine, Ser: 1.16 mg/dL (ref 0.61–1.24)
GFR, Estimated: >60 mL/min (ref >60)
Glucose, Bld: 103 mg/dL — ABNORMAL HIGH (ref 70–99)
Potassium: 4 mmol/L (ref 3.5–5.1)
Sodium: 131 mmol/L — ABNORMAL LOW (ref 135–145)
Total Bilirubin: 0.9 mg/dL (ref 0.3–1.2)
Total Protein: 6.8 g/dL (ref 6.5–8.1)

## 2021-03-02 LAB — PROTIME-INR
INR: 1.1 (ref 0.8–1.2)
Prothrombin Time: 14.6 seconds (ref 11.4–15.2)

## 2021-03-02 LAB — APTT: aPTT: 39 seconds — ABNORMAL HIGH (ref 24–36)

## 2021-03-02 LAB — LACTIC ACID, PLASMA: Lactic Acid, Venous: 1 mmol/L (ref 0.5–1.9)

## 2021-03-02 MED ORDER — AMOXICILLIN-POT CLAVULANATE 875-125 MG PO TABS: 1.0000 | ORAL_TABLET | Freq: Two times a day (BID) | ORAL | 0 refills | Status: AC

## 2021-03-02 MED ORDER — SODIUM CHLORIDE 0.9 % IV SOLN
1.0000 g | Freq: Once | INTRAVENOUS | Status: AC
  Administered 2021-03-02: 1 g via INTRAVENOUS
  Filled 2021-03-02: qty 10

## 2021-03-02 NOTE — ED Triage Notes (Signed)
Pt came ems called for a fall, wife stated that he might have a UTI as well; pt vs stable; pt denies injury when he fell

## 2021-03-02 NOTE — Discharge Instructions (Addendum)
Follow-up with urology.  You have been placed on Augmentin you should take this twice daily for 7 days.  Return emergency department if worsening.  Eat yogurt to prevent diarrhea.

## 2021-03-02 NOTE — ED Provider Notes (Signed)
[facility] Emergency Department Provider Note  ____________________________________________   Event Date/Time   First MD Initiated Contact with Patient 03/02/21 1121     (approximate)  I have reviewed the triage vital signs and the nursing notes.   HISTORY  Chief Complaint Fever and Fall    HPI Eric Torres is a 87 y.o. male presents emergency department with fever and possible UTI.  Patient had a UTI about 1 to 2 weeks ago.  Unsure of how he was treated.  His wife states he has not been acting normal.  States he has been feeling bad.  No vomiting or diarrhea.  No abdominal pain.  No cough or congestion.  Patient has history of frequent falls.  States he does not feel injured from his fall.  He tried to call his PCP about his possible UTI and has not received a call back this time.  Symptoms started on Saturday    Past Medical History:  Diagnosis Date  . Bilateral foot pain    LIKELY NEUROPATHY  . BPH (benign prostatic hyperplasia)   . Cancer (HCC)    ,HX OF PROSTATE CANCER AND BLADDER CANCER  . Collagenous colitis   . COPD (chronic obstructive pulmonary disease) (HCC)    MILD-NO INHALERS PER PT  . Essential tremor   . Frequent falls   . Heart murmur   . History of brachytherapy   . History of TIA (transient ischemic attack) 2015   LEFT THUMB AND INDEX FINGER-SOMETIMES I DROP THINGS  . Hypercholesteremia   . Hypertension   . Hypothyroidism   . PVD (peripheral vascular disease) ([place])   . Stroke ([place])   . Vitamin D deficiency     Patient Active Problem List   Diagnosis Date Noted  . Dysphagia 06/07/2020  . Parkinson's disease ([place]) 06/07/2020  . Aspiration pneumonia ([place]) 06/06/2020  . Acute respiratory failure with hypoxia ([place]) 06/06/2020  . HTN (hypertension) 06/06/2020  . History of CVA (cerebrovascular accident) 06/06/2020  . Hypotension 06/06/2020  . Tremors of nervous system 08/29/2019  . CAP (community acquired pneumonia)  08/28/2019  . Weakness generalized 08/28/2019  . Tremor 08/28/2019    Past Surgical History:  Procedure Laterality Date  . CHOLECYSTECTOMY    . CYSTOSCOPY WITH BIOPSY N/A 12/29/2015   Procedure: CYSTOSCOPY WITH BIOPSY;  Surgeon: Royston Cowper, MD;  Location: [facility] ORS;  Service: Urology;  Laterality: N/A;  . DIAGNOSTIC LAPAROSCOPY    . ESOPHAGOGASTRODUODENOSCOPY (EGD) WITH PROPOFOL N/A 02/15/2019   Procedure: ESOPHAGOGASTRODUODENOSCOPY (EGD) WITH PROPOFOL;  Surgeon: Lollie Sails, MD;  Location: [facility] ENDOSCOPY;  Service: Endoscopy;  Laterality: N/A;  . EXPLORATORY LAPAROTOMY  1991   VOLVULUS AND BOWEL OBSTRUCTION  . HERNIA REPAIR    . ILIAC ARTERY STENT Bilateral   . TONSILLECTOMY    . TRANSURETHRAL RESECTION OF BLADDER TUMOR N/A 12/29/2015   Procedure: TRANSURETHRAL RESECTION OF BLADDER TUMOR (TURBT)/MITOMYCIN INSTILLATION;  Surgeon: Royston Cowper, MD;  Location: [facility] ORS;  Service: Urology;  Laterality: N/A;  . TRANSURETHRAL RESECTION OF BLADDER TUMOR WITH GYRUS (TURBT-GYRUS)  2013  . VASECTOMY      Prior to Admission medications   Medication Sig Start Date End Date Taking? Authorizing Provider  amoxicillin-clavulanate (AUGMENTIN) 875-125 MG tablet Take 1 tablet by mouth 2 (two) times daily for 7 days. 03/02/21 03/09/21 Yes Devin Foskey, Linden Dolin, PA-C  acetaminophen (TYLENOL) 500 MG tablet Take 500 mg by mouth every 6 (six) hours as needed.    [provider]  aspirin 81 MG tablet Take 81 mg by mouth daily.    [provider]  atorvastatin (LIPITOR) 80 MG tablet Take 80 mg by mouth daily at 6 PM.    [provider]  finasteride (PROSCAR) 5 MG tablet Take 5 mg by mouth at bedtime.    [provider]  gabapentin (NEURONTIN) 100 MG capsule Take 100 mg by mouth 2 (two) times daily.    [provider]  levothyroxine (SYNTHROID, LEVOTHROID) 112 MCG tablet Take 112 mcg by mouth at bedtime.     [provider]  Nutritional Supplements  (BLADDER 2.2) TABS Take 1 tablet by mouth 2 (two) times daily.     [provider]  pantoprazole (PROTONIX) 40 MG tablet Take 1 tablet (40 mg total) by mouth daily. 09/18/20   Lucilla Lame, MD  primidone (MYSOLINE) 250 MG tablet Take 250 mg by mouth 2 (two) times daily with a meal.    [provider]  propranolol (INDERAL) 20 MG tablet Take 20 mg by mouth 2 (two) times daily.     [provider]  rOPINIRole (REQUIP) 0.25 MG tablet Take 2 tablets by mouth in the morning, at noon, and at bedtime. Patient not taking: Reported on 06/06/2020 05/14/20 06/13/20  [provider]  senna-docusate (SENOKOT-S) 8.6-50 MG tablet Take 2 tablets by mouth at bedtime as needed for mild constipation or moderate constipation. 06/09/20   Amin, Jeanella Flattery, MD  tamsulosin (FLOMAX) 0.4 MG CAPS capsule Take 0.4 mg by mouth daily after supper.    [provider]    Allergies Patient has no known allergies.  Family History  Family history unknown: Yes    Social History Social History   Tobacco Use  . Smoking status: Former Smoker    Packs/day: 0.50    Years: 35.00    Pack years: 17.50    Types: Cigarettes    Quit date: 12/23/2011    Years since quitting: 9.1  . Smokeless tobacco: Never Used  Vaping Use  . Vaping Use: Never used  Substance Use Topics  . Alcohol use: Yes    Comment: OCC  . Drug use: No    Review of Systems  Constitutional: No fever/chills Eyes: No visual changes. ENT: No sore throat. Respiratory: Denies cough Cardiovascular: Denies chest pain Gastrointestinal: Denies abdominal pain Genitourinary: Negative for dysuria.  Concerns for UTI Musculoskeletal: Negative for back pain. Skin: Negative for rash. Psychiatric: no mood changes,     ____________________________________________   PHYSICAL EXAM:  VITAL SIGNS: ED Triage Vitals  Enc Vitals Group     BP 03/02/21 1119 (!) 154/83     Pulse Rate 03/02/21 1119 73     Resp 03/02/21 1119  20     Temp 03/02/21 1119 (!) 100.4 F (38 C)     Temp Source 03/02/21 1119 Oral     SpO2 03/02/21 1119 99 %     Weight 03/02/21 1120 187 lb (84.8 kg)     Height 03/02/21 1120 5\' 8"  (1.727 m)     Head Circumference --      Peak Flow --      Pain Score 03/02/21 1119 0     Pain Loc --      Pain Edu? --      Excl. in [place]? --     Constitutional: Alert and oriented. Well appearing and in no acute distress. Eyes: Conjunctivae are normal.  Head: Atraumatic. Nose: No congestion/rhinnorhea. Mouth/Throat: Mucous membranes are moist.   Neck:  supple no lymphadenopathy noted Cardiovascular: Normal rate, regular rhythm. Heart sounds are normal Respiratory: Normal respiratory effort.  No retractions, lungs c t a  Abd: soft nontender bs normal all 4 quad GU: deferred Musculoskeletal: FROM all extremities, warm and well perfused Neurologic:  Normal speech and language.  Skin:  Skin is warm, dry and intact. No rash noted. Psychiatric: Mood and affect are normal. Speech and behavior are normal.  ____________________________________________   LABS (all labs ordered are listed, but only abnormal results are displayed)  Labs Reviewed  COMPREHENSIVE METABOLIC PANEL - Abnormal; Notable for the following components:      Result Value   Sodium 131 (*)    Chloride 95 (*)    Glucose, Bld 103 (*)    Calcium 8.3 (*)    Albumin 3.3 (*)    Alkaline Phosphatase 33 (*)    All other components within normal limits  CBC WITH DIFFERENTIAL/PLATELET - Abnormal; Notable for the following components:   Monocytes Absolute 1.2 (*)    All other components within normal limits  URINALYSIS, COMPLETE (UACMP) WITH MICROSCOPIC - Abnormal; Notable for the following components:   Color, Urine YELLOW (*)    APPearance CLOUDY (*)    Hgb urine dipstick SMALL (*)    Protein, ur 30 (*)    Leukocytes,Ua LARGE (*)    WBC, UA >50 (*)    Bacteria, UA MANY (*)    All other components within normal limits  APTT -  Abnormal; Notable for the following components:   aPTT 39 (*)    All other components within normal limits  CULTURE, BLOOD (SINGLE)  URINE CULTURE  LACTIC ACID, PLASMA  PROTIME-INR  LACTIC ACID, PLASMA   ____________________________________________   ____________________________________________  RADIOLOGY  Chest x-ray  ____________________________________________   PROCEDURES  Procedure(s) performed: No  Procedures    ____________________________________________   INITIAL IMPRESSION / ASSESSMENT AND PLAN / ED COURSE  Pertinent labs & imaging results that were available during my care of the patient were reviewed by me and considered in my medical decision making (see chart for details).   Patient is 87 year old male presents with fever and chills.  See HPI.  Physical exam shows patient appears stable at the time, he is febrile.  DDx: Sepsis, UTI, CAP, COVID  Sepsis protocol started.  This will be an evolving sepsis as I do not feel he is septic at this time.  He is very alert and oriented, he is not tachycardic  Chest x-ray reviewed by me confirmed by radiology to be normal   Labs are reassuring intent to follow the patient's normal trends.  Lactic acid is negative so patient is not septic.  Urinalysis does show infection with a large amount of leuks, greater than 50 WBCs, white blood cell clumps, many bacteria.  Urine culture was added.  Patient was given Rocephin 1 g IV while here in the ED.  Augmentin twice daily for 7 days.  He is to follow-up with urology.  Return emergency department worsening.  Discharged in stable condition in the care of his wife.  Eric Torres was evaluated in Emergency Department on 03/02/2021 for the symptoms described in the history of present illness. He was evaluated in the context of the global COVID-19 pandemic, which necessitated consideration that the patient might be at risk for infection with the SARS-CoV-2 virus that causes  COVID-19. Institutional protocols and algorithms that pertain to the evaluation of patients at risk for COVID-19 are in a state of rapid  change based on information released by regulatory bodies including the CDC and federal and state organizations. These policies and algorithms were followed during the patient's care in the ED.    As part of my medical decision making, I reviewed the following data within the [place] notes reviewed and incorporated, Labs reviewed , EKG interpreted NSR, Old chart reviewed, Radiograph reviewed , Notes from prior ED visits and [place] Controlled Substance Database  ____________________________________________   FINAL CLINICAL IMPRESSION(S) / ED DIAGNOSES  Final diagnoses:  Recurrent UTI      NEW MEDICATIONS STARTED DURING THIS VISIT:  New Prescriptions   AMOXICILLIN-CLAVULANATE (AUGMENTIN) 875-125 MG TABLET    Take 1 tablet by mouth 2 (two) times daily for 7 days.     Note:  This document was prepared using Dragon voice recognition software and may include unintentional dictation errors.    Versie Starks, PA-C 03/02/21 1325    Delman Kitten, MD 03/02/21 [phone]

## 2021-03-04 LAB — URINE CULTURE: Culture: >=100000 — AB

## 2021-03-07 LAB — CULTURE, BLOOD (SINGLE)
Culture: NO GROWTH
Special Requests: ADEQUATE

## 2021-03-30 ENCOUNTER — Ambulatory Visit (INDEPENDENT_AMBULATORY_CARE_PROVIDER_SITE_OTHER): Payer: Medicare Other

## 2021-03-30 ENCOUNTER — Other Ambulatory Visit: Payer: Self-pay

## 2021-03-30 ENCOUNTER — Ambulatory Visit
Admission: EM | Admit: 2021-03-30 | Discharge: 2021-03-30 | Disposition: A | Discharge status: Home / Self Care | Payer: Medicare Other | Attending: Family Medicine | Admitting: Family Medicine

## 2021-03-30 DIAGNOSIS — R059 Cough, unspecified: Secondary | ICD-10-CM

## 2021-03-30 DIAGNOSIS — J441 Chronic obstructive pulmonary disease with (acute) exacerbation: Secondary | ICD-10-CM | POA: Diagnosis not present

## 2021-03-30 MED ORDER — DOXYCYCLINE HYCLATE 100 MG PO CAPS: 100.0000 mg | ORAL_CAPSULE | Freq: Two times a day (BID) | ORAL | 0 refills | Status: DC

## 2021-03-30 MED ORDER — PREDNISONE 50 MG PO TABS: ORAL_TABLET | ORAL | 0 refills | Status: DC

## 2021-03-30 MED ORDER — ALBUTEROL SULFATE HFA 108 (90 BASE) MCG/ACT IN AERS: 1.0000 | INHALATION_SPRAY | Freq: Four times a day (QID) | RESPIRATORY_TRACT | 0 refills | Status: DC | PRN

## 2021-03-30 NOTE — ED Triage Notes (Signed)
Pt presents with cough x 3-4 days.  Productive for white mucous with some clumps of blood.  No fever, body aches, HA, SOB, etc. Pt has had aspiration pneumonia three times in last two years due to issue with esophagus.  Did choke on a piece of food five days ago.

## 2021-03-30 NOTE — Discharge Instructions (Addendum)
Xray was negative for pneumonia.  Medication as prescribed.   Take care  Dr. Lacinda Axon

## 2021-03-30 NOTE — ED Provider Notes (Signed)
[facility]    CSN: [id number] Arrival date & time: 03/30/21  1547      History   Chief Complaint Chief Complaint  Patient presents with   Cough   HPI  87 year old male presents with cough.  Patient has a history of aspiration pneumonia.  Patient has had prior imaging studies which show esophageal motility issue and silent aspiration.  He has had cough for the past 4 days.  He states that it is a deep cough.  Productive.  He recently choked on some food approximately 5 days ago.  He has underlying COPD as well.  No fever.  No relieving factors.  No other complaints or concerns at this time.  Past Medical History:  Diagnosis Date   Bilateral foot pain    LIKELY NEUROPATHY   BPH (benign prostatic hyperplasia)    Cancer (HCC)    ,HX OF PROSTATE CANCER AND BLADDER CANCER   Collagenous colitis    COPD (chronic obstructive pulmonary disease) (HCC)    MILD-NO INHALERS PER PT   Essential tremor    Frequent falls    Heart murmur    History of brachytherapy    History of TIA (transient ischemic attack) 2015   LEFT THUMB AND INDEX FINGER-SOMETIMES I DROP THINGS   Hypercholesteremia    Hypertension    Hypothyroidism    PVD (peripheral vascular disease) ([place])    Stroke ([place])    Vitamin D deficiency     Patient Active Problem List   Diagnosis Date Noted   Dysphagia 06/07/2020   Parkinson's disease ([place]) 06/07/2020   Aspiration pneumonia ([place]) 06/06/2020   Acute respiratory failure with hypoxia ([place]) 06/06/2020   HTN (hypertension) 06/06/2020   History of CVA (cerebrovascular accident) 06/06/2020   Hypotension 06/06/2020   Tremors of nervous system 08/29/2019   CAP (community acquired pneumonia) 08/28/2019   Weakness generalized 08/28/2019   Tremor 08/28/2019    Past Surgical History:  Procedure Laterality Date   CHOLECYSTECTOMY     CYSTOSCOPY WITH BIOPSY N/A 12/29/2015   Procedure: CYSTOSCOPY WITH BIOPSY;  Surgeon: Royston Cowper, MD;  Location: [facility] ORS;   Service: Urology;  Laterality: N/A;   DIAGNOSTIC LAPAROSCOPY     ESOPHAGOGASTRODUODENOSCOPY (EGD) WITH PROPOFOL N/A 02/15/2019   Procedure: ESOPHAGOGASTRODUODENOSCOPY (EGD) WITH PROPOFOL;  Surgeon: Lollie Sails, MD;  Location: [facility] ENDOSCOPY;  Service: Endoscopy;  Laterality: N/A;   EXPLORATORY LAPAROTOMY  1991   VOLVULUS AND BOWEL OBSTRUCTION   HERNIA REPAIR     ILIAC ARTERY STENT Bilateral    TONSILLECTOMY     TRANSURETHRAL RESECTION OF BLADDER TUMOR N/A 12/29/2015   Procedure: TRANSURETHRAL RESECTION OF BLADDER TUMOR (TURBT)/MITOMYCIN INSTILLATION;  Surgeon: Royston Cowper, MD;  Location: [facility] ORS;  Service: Urology;  Laterality: N/A;   TRANSURETHRAL RESECTION OF BLADDER TUMOR WITH GYRUS (TURBT-GYRUS)  2013   VASECTOMY         Home Medications    Prior to Admission medications   Medication Sig Start Date End Date Taking? Authorizing Provider  albuterol (VENTOLIN HFA) 108 (90 Base) MCG/ACT inhaler Inhale 1-2 puffs into the lungs every 6 (six) hours as needed for wheezing or shortness of breath. 03/30/21  Yes Thersa Salt G, DO  aspirin 81 MG tablet Take 81 mg by mouth daily.   Yes [provider]  atorvastatin (LIPITOR) 80 MG tablet Take 80 mg by mouth daily at 6 PM.   Yes [provider]  doxycycline (VIBRAMYCIN) 100 MG capsule Take 1 capsule (100 mg  total) by mouth 2 (two) times daily. 03/30/21  Yes Adarsh Mundorf G, DO  finasteride (PROSCAR) 5 MG tablet Take 5 mg by mouth at bedtime.   Yes [provider]  levothyroxine (SYNTHROID, LEVOTHROID) 112 MCG tablet Take 112 mcg by mouth at bedtime.    Yes [provider]  Nutritional Supplements (BLADDER 2.2) TABS Take 1 tablet by mouth 2 (two) times daily.    Yes [provider]  pantoprazole (PROTONIX) 40 MG tablet Take 1 tablet (40 mg total) by mouth daily. 09/18/20  Yes Lucilla Lame, MD  predniSONE (DELTASONE) 50 MG tablet 1 tablet daily x 5 days 03/30/21  Yes Mardel Grudzien G, DO  primidone  (MYSOLINE) 250 MG tablet Take 250 mg by mouth 2 (two) times daily with a meal.   Yes [provider]  propranolol (INDERAL) 20 MG tablet Take 20 mg by mouth 2 (two) times daily.    Yes [provider]  tamsulosin (FLOMAX) 0.4 MG CAPS capsule Take 0.4 mg by mouth daily after supper.   Yes [provider]  rOPINIRole (REQUIP) 0.25 MG tablet Take 2 tablets by mouth in the morning, at noon, and at bedtime. Patient not taking: Reported on 06/06/2020 05/14/20 06/13/20  [provider]    Family History Family History  Family history unknown: Yes    Social History Social History   Tobacco Use   Smoking status: Former    Packs/day: 0.50    Years: 35.00    Pack years: 17.50    Types: Cigarettes    Quit date: 12/23/2011    Years since quitting: 9.2   Smokeless tobacco: Never  Vaping Use   Vaping Use: Never used  Substance Use Topics   Alcohol use: Yes    Comment: OCC   Drug use: No     Allergies   Patient has no known allergies.   Review of Systems Review of Systems  Constitutional:  Negative for fever.  Respiratory:  Positive for cough.    Physical Exam Triage Vital Signs ED Triage Vitals  Enc Vitals Group     BP 03/30/21 1617 (!) 141/73     Pulse Rate 03/30/21 1617 61     Resp 03/30/21 1617 20     Temp 03/30/21 1617 98 F (36.7 C)     Temp Source 03/30/21 1617 Oral     SpO2 03/30/21 1617 99 %     Weight --      Height --      Head Circumference --      Peak Flow --      Pain Score 03/30/21 1619 0     Pain Loc --      Pain Edu? --      Excl. in [place]? --    Updated Vital Signs BP (!) 141/73 (BP Location: Right Arm)   Pulse 61   Temp 98 F (36.7 C) (Oral)   Resp 20   SpO2 99%   Visual Acuity Right Eye Distance:   Left Eye Distance:   Bilateral Distance:    Right Eye Near:   Left Eye Near:    Bilateral Near:     Physical Exam Vitals and nursing note reviewed.  Constitutional:      General: He is not in acute distress.     Appearance: Normal appearance. He is not ill-appearing.  HENT:     Head: Normocephalic and atraumatic.  Eyes:     General:  Right eye: No discharge.        Left eye: No discharge.     Conjunctiva/sclera: Conjunctivae normal.  Cardiovascular:     Rate and Rhythm: Normal rate and regular rhythm.  Pulmonary:     Effort: Pulmonary effort is normal.     Breath sounds: Wheezing present.  Neurological:     Mental Status: He is alert.  Psychiatric:        Mood and Affect: Mood normal.        Behavior: Behavior normal.     UC Treatments / Results  Labs (all labs ordered are listed, but only abnormal results are displayed) Labs Reviewed - No data to display  EKG   Radiology DG Chest 2 View  Result Date: 03/30/2021 CLINICAL DATA:  Productive cough. EXAM: CHEST - 2 VIEW COMPARISON:  Radiograph 03/02/2021.  CT 08/07/2015 FINDINGS: Borderline hyperinflation and bronchial thickening. Normal heart size with stable mediastinal contours. Mild aortic atherosclerosis. No acute airspace disease. No pleural effusion or pneumothorax. No pulmonary edema. No acute osseous abnormalities are seen. IMPRESSION: Borderline hyperinflation and bronchial thickening, can be seen with COPD, bronchitis or asthma. No acute pulmonary process. Electronically Signed   By: Keith Rake M.D.   On: 03/30/2021 17:06    Procedures Procedures (including critical care time)  Medications Ordered in UC Medications - No data to display  Initial Impression / Assessment and Plan / UC Course  I have reviewed the triage vital signs and the nursing notes.  Pertinent labs & imaging results that were available during my care of the patient were reviewed by me and considered in my medical decision making (see chart for details).    87 year old male presents with COPD exacerbation.  X-ray obtained and was independently reviewed by me.  Interpretation: No evidence of pneumonia.  Hyperinflation and bronchial thickening  consistent with underlying COPD.  Patient placed on prednisone, doxycycline, and albuterol for treatment of COPD exacerbation.  Final Clinical Impressions(s) / UC Diagnoses   Final diagnoses:  COPD exacerbation [facility])     Discharge Instructions      Xray was negative for pneumonia.  Medication as prescribed.   Take care  Dr. Lacinda Axon    ED Prescriptions     Medication Sig Dispense Auth. Provider   doxycycline (VIBRAMYCIN) 100 MG capsule Take 1 capsule (100 mg total) by mouth 2 (two) times daily. 14 capsule Antia Rahal G, DO   predniSONE (DELTASONE) 50 MG tablet 1 tablet daily x 5 days 5 tablet Eero Dini G, DO   albuterol (VENTOLIN HFA) 108 (90 Base) MCG/ACT inhaler Inhale 1-2 puffs into the lungs every 6 (six) hours as needed for wheezing or shortness of breath. 18 g Coral Spikes, DO      PDMP not reviewed this encounter.   Coral Spikes, [place] 03/30/21 [phone]

## 2021-06-22 ENCOUNTER — Other Ambulatory Visit: Payer: Self-pay | Admitting: Gastroenterology

## 2021-07-01 ENCOUNTER — Other Ambulatory Visit: Payer: Self-pay

## 2021-07-01 ENCOUNTER — Emergency Department: Payer: Medicare Other

## 2021-07-01 ENCOUNTER — Observation Stay: Payer: Medicare Other

## 2021-07-01 ENCOUNTER — Observation Stay
Admission: EM | Admit: 2021-07-01 | Discharge: 2021-07-02 | Disposition: A | Discharge status: Home / Self Care | Payer: Medicare Other | Attending: Internal Medicine | Admitting: Internal Medicine

## 2021-07-01 ENCOUNTER — Encounter: Payer: Self-pay | Admitting: Emergency Medicine

## 2021-07-01 DIAGNOSIS — Z79899 Other long term (current) drug therapy: Secondary | ICD-10-CM | POA: Diagnosis not present

## 2021-07-01 DIAGNOSIS — R0602 Shortness of breath: Secondary | ICD-10-CM | POA: Diagnosis not present

## 2021-07-01 DIAGNOSIS — U071 COVID-19: Principal | ICD-10-CM | POA: Diagnosis present

## 2021-07-01 DIAGNOSIS — N400 Enlarged prostate without lower urinary tract symptoms: Secondary | ICD-10-CM | POA: Diagnosis present

## 2021-07-01 DIAGNOSIS — G250 Essential tremor: Secondary | ICD-10-CM | POA: Diagnosis present

## 2021-07-01 DIAGNOSIS — R7989 Other specified abnormal findings of blood chemistry: Secondary | ICD-10-CM | POA: Diagnosis present

## 2021-07-01 DIAGNOSIS — I10 Essential (primary) hypertension: Secondary | ICD-10-CM | POA: Diagnosis not present

## 2021-07-01 DIAGNOSIS — Z87891 Personal history of nicotine dependence: Secondary | ICD-10-CM | POA: Diagnosis not present

## 2021-07-01 DIAGNOSIS — E871 Hypo-osmolality and hyponatremia: Secondary | ICD-10-CM | POA: Diagnosis not present

## 2021-07-01 DIAGNOSIS — R531 Weakness: Secondary | ICD-10-CM | POA: Diagnosis present

## 2021-07-01 DIAGNOSIS — I472 Ventricular tachycardia: Secondary | ICD-10-CM

## 2021-07-01 DIAGNOSIS — R778 Other specified abnormalities of plasma proteins: Secondary | ICD-10-CM | POA: Diagnosis not present

## 2021-07-01 DIAGNOSIS — Z8673 Personal history of transient ischemic attack (TIA), and cerebral infarction without residual deficits: Secondary | ICD-10-CM

## 2021-07-01 DIAGNOSIS — R748 Abnormal levels of other serum enzymes: Secondary | ICD-10-CM | POA: Insufficient documentation

## 2021-07-01 DIAGNOSIS — I4729 Other ventricular tachycardia: Secondary | ICD-10-CM

## 2021-07-01 DIAGNOSIS — E039 Hypothyroidism, unspecified: Secondary | ICD-10-CM | POA: Diagnosis present

## 2021-07-01 DIAGNOSIS — J449 Chronic obstructive pulmonary disease, unspecified: Secondary | ICD-10-CM | POA: Diagnosis present

## 2021-07-01 DIAGNOSIS — Z7982 Long term (current) use of aspirin: Secondary | ICD-10-CM | POA: Diagnosis not present

## 2021-07-01 DIAGNOSIS — Y92009 Unspecified place in unspecified non-institutional (private) residence as the place of occurrence of the external cause: Secondary | ICD-10-CM

## 2021-07-01 DIAGNOSIS — W19XXXA Unspecified fall, initial encounter: Secondary | ICD-10-CM

## 2021-07-01 DIAGNOSIS — E785 Hyperlipidemia, unspecified: Secondary | ICD-10-CM | POA: Diagnosis present

## 2021-07-01 LAB — TROPONIN I (HIGH SENSITIVITY)
Troponin I (High Sensitivity): 23 ng/L — ABNORMAL HIGH (ref <18)
Troponin I (High Sensitivity): 25 ng/L — ABNORMAL HIGH (ref <18)
Troponin I (High Sensitivity): 24 ng/L — ABNORMAL HIGH (ref <18)
Troponin I (High Sensitivity): 25 ng/L — ABNORMAL HIGH (ref <18)
Troponin I (High Sensitivity): 25 ng/L — ABNORMAL HIGH (ref <18)

## 2021-07-01 LAB — URINALYSIS, ROUTINE W REFLEX MICROSCOPIC
Bilirubin Urine: NEGATIVE
Glucose, UA: NEGATIVE mg/dL
Hgb urine dipstick: NEGATIVE
Ketones, ur: NEGATIVE mg/dL
Leukocytes,Ua: NEGATIVE
Nitrite: NEGATIVE
Protein, ur: NEGATIVE mg/dL
Specific Gravity, Urine: 1.01 (ref 1.005–1.030)
pH: 6 (ref 5.0–8.0)

## 2021-07-01 LAB — LIPID PANEL
Cholesterol: 114 mg/dL (ref 0–200)
HDL: 64 mg/dL (ref >40)
LDL Cholesterol: 37 mg/dL (ref 0–99)
Total CHOL/HDL Ratio: 1.8 RATIO
Triglycerides: 65 mg/dL (ref <150)
VLDL: 13 mg/dL (ref 0–40)

## 2021-07-01 LAB — RESP PANEL BY RT-PCR (FLU A&B, COVID) ARPGX2
Influenza A by PCR: NEGATIVE
Influenza B by PCR: NEGATIVE
SARS Coronavirus 2 by RT PCR: POSITIVE — AB

## 2021-07-01 LAB — CBC
HCT: 41.9 % (ref 39.0–52.0)
Hemoglobin: 15 g/dL (ref 13.0–17.0)
MCH: 34.1 pg — ABNORMAL HIGH (ref 26.0–34.0)
MCHC: 35.8 g/dL (ref 30.0–36.0)
MCV: 95.2 fL (ref 80.0–100.0)
Platelets: 173 10*3/uL (ref 150–400)
RBC: 4.4 MIL/uL (ref 4.22–5.81)
RDW: 13.2 % (ref 11.5–15.5)
WBC: 11 10*3/uL — ABNORMAL HIGH (ref 4.0–10.5)
nRBC: 0 % (ref 0.0–0.2)

## 2021-07-01 LAB — BASIC METABOLIC PANEL
Anion gap: 5 (ref 5–15)
BUN: 19 mg/dL (ref 8–23)
CO2: 26 mmol/L (ref 22–32)
Calcium: 8.7 mg/dL — ABNORMAL LOW (ref 8.9–10.3)
Chloride: 98 mmol/L (ref 98–111)
Creatinine, Ser: 0.97 mg/dL (ref 0.61–1.24)
GFR, Estimated: >60 mL/min (ref >60)
Glucose, Bld: 99 mg/dL (ref 70–99)
Potassium: 4.1 mmol/L (ref 3.5–5.1)
Sodium: 129 mmol/L — ABNORMAL LOW (ref 135–145)

## 2021-07-01 LAB — HEMOGLOBIN A1C
Hgb A1c MFr Bld: 5.3 % (ref 4.8–5.6)
Mean Plasma Glucose: 105 mg/dL

## 2021-07-01 LAB — D-DIMER, QUANTITATIVE: D-Dimer, Quant: 0.97 ug/mL-FEU — ABNORMAL HIGH (ref 0.00–0.50)

## 2021-07-01 LAB — C-REACTIVE PROTEIN: CRP: 11.8 mg/dL — ABNORMAL HIGH (ref <1.0)

## 2021-07-01 LAB — BRAIN NATRIURETIC PEPTIDE: B Natriuretic Peptide: 152.8 pg/mL — ABNORMAL HIGH (ref 0.0–100.0)

## 2021-07-01 LAB — PROCALCITONIN: Procalcitonin: <0.1 ng/mL

## 2021-07-01 MED ORDER — PROPRANOLOL HCL 20 MG PO TABS
20.0000 mg | ORAL_TABLET | Freq: Two times a day (BID) | ORAL | Status: DC
  Administered 2021-07-01 – 2021-07-02 (×3): 20 mg via ORAL
  Filled 2021-07-01 (×3): qty 1

## 2021-07-01 MED ORDER — REMDESIVIR 100 MG IV SOLR
100.0000 mg | Freq: Every day | INTRAVENOUS | Status: DC
Start: 2021-07-02 — End: 2021-07-01

## 2021-07-01 MED ORDER — NIRMATRELVIR/RITONAVIR (PAXLOVID)TABLET
3.0000 | ORAL_TABLET | Freq: Two times a day (BID) | ORAL | Status: DC
Start: 2021-07-01 — End: 2021-07-01
  Administered 2021-07-01: 3 via ORAL
  Filled 2021-07-01: qty 30

## 2021-07-01 MED ORDER — ACETAMINOPHEN 325 MG PO TABS: 650.0000 mg | ORAL_TABLET | Freq: Four times a day (QID) | ORAL | Status: DC | PRN

## 2021-07-01 MED ORDER — FINASTERIDE 5 MG PO TABS
5.0000 mg | ORAL_TABLET | Freq: Every day | ORAL | Status: DC
  Administered 2021-07-02: 5 mg via ORAL
  Filled 2021-07-01 (×2): qty 1

## 2021-07-01 MED ORDER — MOLNUPIRAVIR EUA 200MG CAPSULE
4.0000 | ORAL_CAPSULE | Freq: Two times a day (BID) | ORAL | Status: DC
  Administered 2021-07-01 – 2021-07-02 (×2): 800 mg via ORAL
  Filled 2021-07-01: qty 4

## 2021-07-01 MED ORDER — SODIUM CHLORIDE 0.9 % IV BOLUS
500.0000 mL | Freq: Once | INTRAVENOUS | Status: AC
  Administered 2021-07-01: 500 mL via INTRAVENOUS

## 2021-07-01 MED ORDER — PANTOPRAZOLE SODIUM 40 MG PO TBEC
40.0000 mg | DELAYED_RELEASE_TABLET | Freq: Every day | ORAL | Status: DC
  Administered 2021-07-01 – 2021-07-02 (×2): 40 mg via ORAL
  Filled 2021-07-01: qty 1

## 2021-07-01 MED ORDER — DM-GUAIFENESIN ER 30-600 MG PO TB12
1.0000 | ORAL_TABLET | Freq: Two times a day (BID) | ORAL | Status: DC | PRN
  Administered 2021-07-01: 1 via ORAL
  Filled 2021-07-01: qty 1

## 2021-07-01 MED ORDER — ONDANSETRON HCL 4 MG/2ML IJ SOLN: 4.0000 mg | Freq: Three times a day (TID) | INTRAMUSCULAR | Status: DC | PRN

## 2021-07-01 MED ORDER — BLADDER 2.2 PO TABS: 1.0000 | ORAL_TABLET | Freq: Two times a day (BID) | ORAL | Status: DC

## 2021-07-01 MED ORDER — ATORVASTATIN CALCIUM 20 MG PO TABS
80.0000 mg | ORAL_TABLET | Freq: Every day | ORAL | Status: DC
  Administered 2021-07-01: 80 mg via ORAL
  Filled 2021-07-01: qty 4

## 2021-07-01 MED ORDER — PRIMIDONE 250 MG PO TABS
250.0000 mg | ORAL_TABLET | Freq: Two times a day (BID) | ORAL | Status: DC
  Administered 2021-07-01 – 2021-07-02 (×2): 250 mg via ORAL
  Filled 2021-07-01 (×3): qty 1

## 2021-07-01 MED ORDER — ALBUTEROL SULFATE HFA 108 (90 BASE) MCG/ACT IN AERS
2.0000 | INHALATION_SPRAY | RESPIRATORY_TRACT | Status: DC | PRN
  Filled 2021-07-01: qty 6.7

## 2021-07-01 MED ORDER — HYDRALAZINE HCL 20 MG/ML IJ SOLN: 5.0000 mg | INTRAMUSCULAR | Status: DC | PRN

## 2021-07-01 MED ORDER — ASPIRIN EC 81 MG PO TBEC
81.0000 mg | DELAYED_RELEASE_TABLET | Freq: Every day | ORAL | Status: DC
  Administered 2021-07-01 – 2021-07-02 (×2): 81 mg via ORAL
  Filled 2021-07-01 (×2): qty 1

## 2021-07-01 MED ORDER — PHENOL 1.4 % MT LIQD
1.0000 | OROMUCOSAL | Status: DC | PRN
  Filled 2021-07-01: qty 177

## 2021-07-01 MED ORDER — SODIUM CHLORIDE 0.9 % IV SOLN: INTRAVENOUS | Status: DC

## 2021-07-01 MED ORDER — SODIUM CHLORIDE 0.9 % IV SOLN
200.0000 mg | Freq: Once | INTRAVENOUS | Status: DC
  Filled 2021-07-01: qty 40

## 2021-07-01 MED ORDER — ENOXAPARIN SODIUM 40 MG/0.4ML IJ SOSY
40.0000 mg | PREFILLED_SYRINGE | INTRAMUSCULAR | Status: DC
  Administered 2021-07-01: 40 mg via SUBCUTANEOUS
  Filled 2021-07-01: qty 0.4

## 2021-07-01 MED ORDER — LEVOTHYROXINE SODIUM 112 MCG PO TABS
112.0000 ug | ORAL_TABLET | Freq: Every day | ORAL | Status: DC
  Administered 2021-07-02: 112 ug via ORAL
  Filled 2021-07-01 (×2): qty 1

## 2021-07-01 NOTE — H&P (Signed)
History and Physical    KELBY ADELL PQZ:[id number] DOB: 18-Oct-1931 DOA: 07/01/2021  Referring MD/NP/PA:   PCP: Kirk Ruths, MD   Patient coming from:  The patient is coming from home.    Chief Complaint: fall, cough  HPI: Eric Torres is a 87 y.o. male with medical history significant of hypertension, hyperlipidemia, COPD, stroke, TIA, hypothyroidism, PAD, essential tremor, BPH, collagenase colitis, who presents with fall, cough.  Per patient's wife at the bedside, patient has generalized weakness in the several days.  Patient has dry cough cough, sore throat, mild shortness breath for several days.  No chest pain.  Patient fell several times without significant injury.  No loss of consciousness.  Patient does not have unilateral numbness or tingling in extremities.  No facial droop or slurred speech.  Patient had diarrhea recently, which has resolved.  No nausea vomiting or abdominal pain.  No symptoms of UTI. Of note, patient was noted to have several episodes of nonsustained ventricular tachycardia in ED.   ED Course: pt was found to have positive COVID PCR, sodium 129, renal function okay, WBC 11.0, troponin level 23, 25, temperature 99.4, blood pressure 132/68, heart rate 76, RR 20, oxygen saturation 90-97% on room air.  Chest x-ray negative for infiltration.  Patient is placed on progressive bed for observation.   Review of Systems:   General: no fevers, chills, no body weight gain, has poor appetite, has fatigue HEENT: no blurry vision, hearing changes, has sore throat Respiratory: has dyspnea, coughing, no wheezing CV: no chest pain, no palpitations GI: no nausea, vomiting, abdominal pain, diarrhea, constipation GU: no dysuria, burning on urination, increased urinary frequency, hematuria  Ext: no leg edema Neuro: no unilateral weakness, numbness, or tingling, no vision change or hearing loss. Has fall Skin: no rash, no skin tear. MSK: No muscle spasm, no  deformity, no limitation of range of movement in spin Heme: No easy bruising.  Travel history: No recent long distant travel.  Allergy: No Known Allergies  Past Medical History:  Diagnosis Date   Bilateral foot pain    LIKELY NEUROPATHY   BPH (benign prostatic hyperplasia)    Cancer (HCC)    ,HX OF PROSTATE CANCER AND BLADDER CANCER   Collagenous colitis    COPD (chronic obstructive pulmonary disease) (HCC)    MILD-NO INHALERS PER PT   Essential tremor    Frequent falls    Heart murmur    History of brachytherapy    History of TIA (transient ischemic attack) 2015   LEFT THUMB AND INDEX FINGER-SOMETIMES I DROP THINGS   Hypercholesteremia    Hypertension    Hypothyroidism    PVD (peripheral vascular disease) ([place])    Stroke (HCC)    Vitamin D deficiency     Past Surgical History:  Procedure Laterality Date   CHOLECYSTECTOMY     CYSTOSCOPY WITH BIOPSY N/A 12/29/2015   Procedure: CYSTOSCOPY WITH BIOPSY;  Surgeon: Royston Cowper, MD;  Location: [facility] ORS;  Service: Urology;  Laterality: N/A;   DIAGNOSTIC LAPAROSCOPY     ESOPHAGOGASTRODUODENOSCOPY (EGD) WITH PROPOFOL N/A 02/15/2019   Procedure: ESOPHAGOGASTRODUODENOSCOPY (EGD) WITH PROPOFOL;  Surgeon: Lollie Sails, MD;  Location: [facility] ENDOSCOPY;  Service: Endoscopy;  Laterality: N/A;   EXPLORATORY LAPAROTOMY  1991   VOLVULUS AND BOWEL OBSTRUCTION   HERNIA REPAIR     ILIAC ARTERY STENT Bilateral    TONSILLECTOMY     TRANSURETHRAL RESECTION OF BLADDER TUMOR N/A 12/29/2015   Procedure: TRANSURETHRAL RESECTION OF  BLADDER TUMOR (TURBT)/MITOMYCIN INSTILLATION;  Surgeon: Royston Cowper, MD;  Location: [facility] ORS;  Service: Urology;  Laterality: N/A;   TRANSURETHRAL RESECTION OF BLADDER TUMOR WITH GYRUS (TURBT-GYRUS)  2013   VASECTOMY      Social History:  reports that he quit smoking about 9 years ago. His smoking use included cigarettes. He has a 17.50 pack-year smoking history. He has never used smokeless tobacco. He reports  current alcohol use. He reports that he does not use drugs.  Family History:  Family History  Family history unknown: Yes     Prior to Admission medications   Medication Sig Start Date End Date Taking? Authorizing Provider  albuterol (VENTOLIN HFA) 108 (90 Base) MCG/ACT inhaler Inhale 1-2 puffs into the lungs every 6 (six) hours as needed for wheezing or shortness of breath. 03/30/21   Coral Spikes, DO  aspirin 81 MG tablet Take 81 mg by mouth daily.    [provider]  atorvastatin (LIPITOR) 80 MG tablet Take 80 mg by mouth daily at 6 PM.    [provider]  doxycycline (VIBRAMYCIN) 100 MG capsule Take 1 capsule (100 mg total) by mouth 2 (two) times daily. 03/30/21   Coral Spikes, DO  finasteride (PROSCAR) 5 MG tablet Take 5 mg by mouth at bedtime.    [provider]  levothyroxine (SYNTHROID, LEVOTHROID) 112 MCG tablet Take 112 mcg by mouth at bedtime.     [provider]  Nutritional Supplements (BLADDER 2.2) TABS Take 1 tablet by mouth 2 (two) times daily.     [provider]  pantoprazole (PROTONIX) 40 MG tablet Take 1 tablet (40 mg total) by mouth daily. **PLEASE SCHEDULE FOLLOW UP APPT** 06/28/21   Lucilla Lame, MD  predniSONE (DELTASONE) 50 MG tablet 1 tablet daily x 5 days 03/30/21   Coral Spikes, DO  primidone (MYSOLINE) 250 MG tablet Take 250 mg by mouth 2 (two) times daily with a meal.    [provider]  propranolol (INDERAL) 20 MG tablet Take 20 mg by mouth 2 (two) times daily.     [provider]  rOPINIRole (REQUIP) 0.25 MG tablet Take 2 tablets by mouth in the morning, at noon, and at bedtime. Patient not taking: Reported on 06/06/2020 05/14/20 06/13/20  [provider]  tamsulosin (FLOMAX) 0.4 MG CAPS capsule Take 0.4 mg by mouth daily after supper.    [provider]    Physical Exam: Vitals:   07/01/21 0610 07/01/21 0730 07/01/21 0830 07/01/21 0900  BP:  (!) 150/59 136/60 132/68  Pulse:  76 73  76  Resp: 19 15 15 14   Temp:      TempSrc:      SpO2:  90% 96% 97%  Weight:      Height:       General: Not in acute distress HEENT:       Eyes: PERRL, EOMI, no scleral icterus.       ENT: No discharge from the ears and nose, no pharynx injection, no tonsillar enlargement.        Neck: No JVD, no bruit, no mass felt. Heme: No neck lymph node enlargement. Cardiac: S1/S2, RRR, No gallops or rubs. Respiratory: No rales, wheezing, rhonchi or rubs. GI: Soft, nondistended, nontender, no rebound pain, no organomegaly, BS present. GU: No hematuria Ext: No pitting leg edema bilaterally. 1+DP/PT pulse bilaterally. Musculoskeletal: No joint deformities, No joint redness or warmth, no limitation of ROM in spin. Skin: No rashes.  Neuro:  Alert, oriented X3, cranial nerves II-XII grossly intact, moves all extremities normally. Psych: Patient is not psychotic, no suicidal or hemocidal ideation.  Labs on Admission: I have personally reviewed following labs and imaging studies  CBC: Recent Labs  Lab 07/01/21 0058  WBC 11.0*  HGB 15.0  HCT 41.9  MCV 95.2  PLT 161   Basic Metabolic Panel: Recent Labs  Lab 07/01/21 0058  NA 129*  K 4.1  CL 98  CO2 26  GLUCOSE 99  BUN 19  CREATININE 0.97  CALCIUM 8.7*   GFR: Estimated Creatinine Clearance: 55.5 mL/min (by C-G formula based on SCr of 0.97 mg/dL). Liver Function Tests: No results for input(s): AST, ALT, ALKPHOS, BILITOT, PROT, ALBUMIN in the last 168 hours. No results for input(s): LIPASE, AMYLASE in the last 168 hours. No results for input(s): AMMONIA in the last 168 hours. Coagulation Profile: No results for input(s): INR, PROTIME in the last 168 hours. Cardiac Enzymes: No results for input(s): CKTOTAL, CKMB, CKMBINDEX, TROPONINI in the last 168 hours. BNP (last 3 results) No results for input(s): PROBNP in the last 8760 hours. HbA1C: No results for input(s): HGBA1C in the last 72 hours. CBG: No results for input(s): GLUCAP  in the last 168 hours. Lipid Profile: No results for input(s): CHOL, HDL, LDLCALC, TRIG, CHOLHDL, LDLDIRECT in the last 72 hours. Thyroid Function Tests: No results for input(s): TSH, T4TOTAL, FREET4, T3FREE, THYROIDAB in the last 72 hours. Anemia Panel: No results for input(s): VITAMINB12, FOLATE, FERRITIN, TIBC, IRON, RETICCTPCT in the last 72 hours. Urine analysis:    Component Value Date/Time   COLORURINE YELLOW (A) 07/01/2021 0504   APPEARANCEUR CLEAR (A) 07/01/2021 0504   LABSPEC 1.010 07/01/2021 0504   PHURINE 6.0 07/01/2021 0504   GLUCOSEU NEGATIVE 07/01/2021 0504   HGBUR NEGATIVE 07/01/2021 0504   BILIRUBINUR NEGATIVE 07/01/2021 0504   KETONESUR NEGATIVE 07/01/2021 0504   PROTEINUR NEGATIVE 07/01/2021 0504   NITRITE NEGATIVE 07/01/2021 0504   LEUKOCYTESUR NEGATIVE 07/01/2021 0504   Sepsis Labs: @LABRCNTIP (procalcitonin:4,lacticidven:4) ) Recent Results (from the past 240 hour(s))  Resp Panel by RT-PCR (Flu A&B, Covid) Nasopharyngeal Swab     Status: Abnormal   Collection Time: 07/01/21  7:45 AM   Specimen: Nasopharyngeal Swab; Nasopharyngeal(NP) swabs in vial transport medium  Result Value Ref Range Status   SARS Coronavirus 2 by RT PCR POSITIVE (A) NEGATIVE Final    Comment: RESULT CALLED TO, READ BACK BY AND VERIFIED WITH:  CALLED TO ALLYSON YOW 07/01/21 0856 SLM (NOTE) SARS-CoV-2 target nucleic acids are DETECTED.  The SARS-CoV-2 RNA is generally detectable in upper respiratory specimens during the acute phase of infection. Positive results are indicative of the presence of the identified virus, but do not rule out bacterial infection or co-infection with other pathogens not detected by the test. Clinical correlation with patient history and other diagnostic information is necessary to determine patient infection status. The expected result is Negative.  Fact Sheet for Patients: EntrepreneurPulse.com.au  Fact Sheet for Healthcare  Providers: IncredibleEmployment.be  This test is not yet approved or cleared by the [place] FDA and  has been authorized for detection and/or diagnosis of SARS-CoV-2 by FDA under an Emergency Use Authorization (EUA).  This EUA will remain in effect (meaning this test  can be used) for the duration of  the COVID-19 declaration under Section 564(b)(1) of the Act, 21 U.S.C. section 360bbb-3(b)(1), unless the authorization is terminated or revoked sooner.     Influenza A by PCR NEGATIVE NEGATIVE Final  Influenza B by PCR NEGATIVE NEGATIVE Final    Comment: (NOTE) The Xpert Xpress SARS-CoV-2/FLU/RSV plus assay is intended as an aid in the diagnosis of influenza from Nasopharyngeal swab specimens and should not be used as a sole basis for treatment. Nasal washings and aspirates are unacceptable for Xpert Xpress SARS-CoV-2/FLU/RSV testing.  Fact Sheet for Patients: EntrepreneurPulse.com.au  Fact Sheet for Healthcare Providers: IncredibleEmployment.be  This test is not yet approved or cleared by the [place] FDA and has been authorized for detection and/or diagnosis of SARS-CoV-2 by FDA under an Emergency Use Authorization (EUA). This EUA will remain in effect (meaning this test can be used) for the duration of the COVID-19 declaration under Section 564(b)(1) of the Act, 21 U.S.C. section 360bbb-3(b)(1), unless the authorization is terminated or revoked.  Performed at [facility], [address] [place], [place] [address]      Radiological Exams on Admission: DG Chest Portable 1 View  Result Date: 07/01/2021 CLINICAL DATA:  87 year old male with weakness. EXAM: PORTABLE CHEST 1 VIEW COMPARISON:  Chest radiographs 03/10/2021 and earlier. FINDINGS: Portable AP upright view at 0652 hours. Lower lung volumes. Stable cardiac size and mediastinal contours. Heart size at the upper limits of normal. Visualized  tracheal air column is within normal limits. Mild crowding of lung markings but otherwise Allowing for portable technique the lungs are clear. No pneumothorax or pleural effusion. Negative visible bowel gas pattern. No acute osseous abnormality identified. IMPRESSION: Lower lung volumes.  No acute cardiopulmonary abnormality. Electronically Signed   By: Genevie Ann M.D.   On: 07/01/2021 07:26     EKG: I have personally reviewed.  Sinus rhythm, QTC 416, ST depression in precordial leads   Assessment/Plan Principal Problem:   COVID-19 virus infection Active Problems:   HTN (hypertension)   History of CVA (cerebrovascular accident)   Fall at home, initial encounter   BPH (benign prostatic hyperplasia)   COPD (chronic obstructive pulmonary disease) (HCC)   Essential tremor   Hypothyroidism   HLD (hyperlipidemia)   Hyponatremia   Elevated troponin   NSVT (nonsustained ventricular tachycardia) ([place])   COVID-19 virus infection: Patient does not have oxygen desaturation.  Chest x-ray negative.  No fever.  -Placed on progressive bed for observation -Started Monupiravir (contraindicated to take Paxlovid since patient is on primidone) -Bronchodilators -Follow-up inflammatory markers  HTN (hypertension) -IV hydralazine as needed -Propranolol  History of CVA (cerebrovascular accident) -Aspirin, Lipitor  Fall at home, initial encounter: CT head negative for acute intracranial abnormalities -PT/OT  BPH (benign prostatic hyperplasia) -Proscar  COPD (chronic obstructive pulmonary disease) (HCC) -Bronchodilators  Essential tremor -Primidone and propranolol  Hypothyroidism -Synthroid  HLD (hyperlipidemia) -Lipitor  Hyponatremia: Sodium 129.  Likely due to poor oral intake -IV normal saline at 75 cc/h after giving 500 cc normal saline -Follow-up with BMP  Elevated troponin: Troponin level 23, 25, no chest pain.  Most likely due to demand ischemia. -Trend troponin -Check A1c,  FLP -Aspirin, Lipitor  NSVT (nonsustained ventricular tachycardia) [facility]): Patient is asymptomatic -Cardiac monitoring    Status is: Observation  The patient remains OBS appropriate and will d/c before 2 midnights.  Dispo: The patient is from: Home              Anticipated d/c is to: Home              Patient currently is not medically stable to d/c.   Difficult to place patient No  DVT ppx:  SQ Lovenox Code Status: DNR per pt and his wife Family Communication:   Yes, patient's  wife  at bed side Disposition Plan:  Anticipate discharge back to previous environment Consults called:  none Admission status and Level of care: Progressive Cardiac:   for obs    Date of Service 07/01/2021    [facility]   If 7PM-7AM, please contact night-coverage [email or web address] 07/01/2021, 9:47 AM

## 2021-07-01 NOTE — ED Notes (Signed)
Patient resting comfortably. Call light within reach. Alarms active and audible. Fall precautions in place. No needs at this time.

## 2021-07-01 NOTE — ED Provider Notes (Signed)
[facility] Emergency Department Provider Note   ____________________________________________   I have reviewed the triage vital signs and the nursing notes.   HISTORY  Chief Complaint Weakness   History limited by: Not Limited   HPI Eric Torres is a 87 y.o. male who presents to the emergency department today with primary concern for weakness. The patient states that he first noticed the weakness when he was leaving a meeting yesterday. He was having a hard time getting back into the car. Went home and tried to take it easy. He does state that when he then went to go to the bathroom he again because very weak. Slide down a wall. The patient says that he has had similar symptoms once in the past when he had a urinary tract infection. He has not noticed any recent dysuria or bad odor to his urine. The patient denies any fevers or chills. Has had cough recently.   Records reviewed. Per medical record review patient has a history of frequent falls.   Past Medical History:  Diagnosis Date   Bilateral foot pain    LIKELY NEUROPATHY   BPH (benign prostatic hyperplasia)    Cancer (HCC)    ,HX OF PROSTATE CANCER AND BLADDER CANCER   Collagenous colitis    COPD (chronic obstructive pulmonary disease) (HCC)    MILD-NO INHALERS PER PT   Essential tremor    Frequent falls    Heart murmur    History of brachytherapy    History of TIA (transient ischemic attack) 2015   LEFT THUMB AND INDEX FINGER-SOMETIMES I DROP THINGS   Hypercholesteremia    Hypertension    Hypothyroidism    PVD (peripheral vascular disease) ([place])    Stroke ([place])    Vitamin D deficiency     Patient Active Problem List   Diagnosis Date Noted   Dysphagia 06/07/2020   Parkinson's disease ([place]) 06/07/2020   Aspiration pneumonia ([place]) 06/06/2020   Acute respiratory failure with hypoxia ([place]) 06/06/2020   HTN (hypertension) 06/06/2020   History of CVA (cerebrovascular accident) 06/06/2020    Hypotension 06/06/2020   Tremors of nervous system 08/29/2019   CAP (community acquired pneumonia) 08/28/2019   Weakness generalized 08/28/2019   Tremor 08/28/2019    Past Surgical History:  Procedure Laterality Date   CHOLECYSTECTOMY     CYSTOSCOPY WITH BIOPSY N/A 12/29/2015   Procedure: CYSTOSCOPY WITH BIOPSY;  Surgeon: Royston Cowper, MD;  Location: [facility] ORS;  Service: Urology;  Laterality: N/A;   DIAGNOSTIC LAPAROSCOPY     ESOPHAGOGASTRODUODENOSCOPY (EGD) WITH PROPOFOL N/A 02/15/2019   Procedure: ESOPHAGOGASTRODUODENOSCOPY (EGD) WITH PROPOFOL;  Surgeon: Lollie Sails, MD;  Location: [facility] ENDOSCOPY;  Service: Endoscopy;  Laterality: N/A;   EXPLORATORY LAPAROTOMY  1991   VOLVULUS AND BOWEL OBSTRUCTION   HERNIA REPAIR     ILIAC ARTERY STENT Bilateral    TONSILLECTOMY     TRANSURETHRAL RESECTION OF BLADDER TUMOR N/A 12/29/2015   Procedure: TRANSURETHRAL RESECTION OF BLADDER TUMOR (TURBT)/MITOMYCIN INSTILLATION;  Surgeon: Royston Cowper, MD;  Location: [facility] ORS;  Service: Urology;  Laterality: N/A;   TRANSURETHRAL RESECTION OF BLADDER TUMOR WITH GYRUS (TURBT-GYRUS)  2013   VASECTOMY      Prior to Admission medications   Medication Sig Start Date End Date Taking? Authorizing Provider  albuterol (VENTOLIN HFA) 108 (90 Base) MCG/ACT inhaler Inhale 1-2 puffs into the lungs every 6 (six) hours as needed for wheezing or shortness of breath. 03/30/21   Coral Spikes, DO  aspirin 81 MG tablet Take 81 mg by mouth daily.    [provider]  atorvastatin (LIPITOR) 80 MG tablet Take 80 mg by mouth daily at 6 PM.    [provider]  doxycycline (VIBRAMYCIN) 100 MG capsule Take 1 capsule (100 mg total) by mouth 2 (two) times daily. 03/30/21   Coral Spikes, DO  finasteride (PROSCAR) 5 MG tablet Take 5 mg by mouth at bedtime.    [provider]  levothyroxine (SYNTHROID, LEVOTHROID) 112 MCG tablet Take 112 mcg by mouth at bedtime.     [provider]   Nutritional Supplements (BLADDER 2.2) TABS Take 1 tablet by mouth 2 (two) times daily.     [provider]  pantoprazole (PROTONIX) 40 MG tablet Take 1 tablet (40 mg total) by mouth daily. **PLEASE SCHEDULE FOLLOW UP APPT** 06/28/21   Lucilla Lame, MD  predniSONE (DELTASONE) 50 MG tablet 1 tablet daily x 5 days 03/30/21   Coral Spikes, DO  primidone (MYSOLINE) 250 MG tablet Take 250 mg by mouth 2 (two) times daily with a meal.    [provider]  propranolol (INDERAL) 20 MG tablet Take 20 mg by mouth 2 (two) times daily.     [provider]  rOPINIRole (REQUIP) 0.25 MG tablet Take 2 tablets by mouth in the morning, at noon, and at bedtime. Patient not taking: Reported on 06/06/2020 05/14/20 06/13/20  [provider]  tamsulosin (FLOMAX) 0.4 MG CAPS capsule Take 0.4 mg by mouth daily after supper.    [provider]    Allergies Patient has no known allergies.  Family History  Family history unknown: Yes    Social History Social History   Tobacco Use   Smoking status: Former    Packs/day: 0.50    Years: 35.00    Pack years: 17.50    Types: Cigarettes    Quit date: 12/23/2011    Years since quitting: 9.5   Smokeless tobacco: Never  Vaping Use   Vaping Use: Never used  Substance Use Topics   Alcohol use: Yes    Comment: OCC   Drug use: No    Review of Systems Constitutional: No fever/chills. Positive for generalized weakness.  Eyes: No visual changes. ENT: No congestion. Cardiovascular: Denies chest pain. Respiratory: Denies shortness of breath. Positive for cough. Gastrointestinal: No abdominal pain.  No nausea, no vomiting.  No diarrhea.   Genitourinary: Negative for dysuria. Musculoskeletal: Negative for back pain. Skin: Negative for rash. Neurological: Negative for headaches, focal weakness or numbness.  ____________________________________________   PHYSICAL EXAM:  VITAL SIGNS: ED Triage Vitals  Enc Vitals Group     BP  07/01/21 0054 (!) 155/86     Pulse Rate 07/01/21 0054 86     Resp 07/01/21 0054 18     Temp 07/01/21 0054 99.4 F (37.4 C)     Temp Source 07/01/21 0054 Oral     SpO2 07/01/21 0036 99 %     Weight 07/01/21 0055 184 lb (83.5 kg)     Height 07/01/21 0055 5\' 7"  (1.702 m)     Head Circumference --      Peak Flow --      Pain Score 07/01/21 0055 0   Constitutional: Alert and oriented.  Eyes: Conjunctivae are normal.  ENT      Head: Normocephalic and atraumatic.      Nose: No congestion/rhinnorhea.      Mouth/Throat: Mucous membranes are moist.  Neck: No stridor. Hematological/Lymphatic/Immunilogical: No cervical lymphadenopathy. Cardiovascular: Normal rate, regular rhythm.  No murmurs, rubs, or gallops.  Respiratory: Normal respiratory effort without tachypnea nor retractions. Breath sounds are clear and equal bilaterally. No wheezes/rales/rhonchi. Gastrointestinal: Soft and non tender. No rebound. No guarding.  Genitourinary: Deferred Musculoskeletal: Normal range of motion in all extremities. No lower extremity edema. Neurologic:  Normal speech and language. No gross focal neurologic deficits are appreciated.  Skin:  Skin is warm, dry and intact. No rash noted. Psychiatric: Mood and affect are normal. Speech and behavior are normal. Patient exhibits appropriate insight and judgment.  ____________________________________________    LABS (pertinent positives/negatives)  CBC wbc 11.0, hgb 15.0, plt 173 BMP na 129, ca 8.7 Trop 23 to 25 UA not consistent with infection ____________________________________________   EKG  I, Nance Pear, attending physician, personally viewed and interpreted this EKG  EKG Time: 0057 Rate: 87 Rhythm: sinus rhythm  Axis: normal Intervals: qtc 416 QRS: narrow ST changes: no st elevation Impression: abnormal ekg   ____________________________________________    RADIOLOGY  CXR Lower lung volumes. No acute  abnormality  ____________________________________________   PROCEDURES  Procedures  ____________________________________________   INITIAL IMPRESSION / ASSESSMENT AND PLAN / ED COURSE  Pertinent labs & imaging results that were available during my care of the patient were reviewed by me and considered in my medical decision making (see chart for details).   Patient presented to the emergency department today because of concerns for generalized weakness.  On exam patient is awake and alert.  Perhaps he does have slightly decreased strength overall but no focal weakness.  Apparently patient has had symptoms like this with urinary tract infections in the past.  Urine however without concerning findings for infection today.  Blood work did show slight hyponatremia so did wonder if patient is slightly dehydrated.  Will give IV fluids.  Patient also complaining of some cough that did have concern for possible COVID.  Chest x-ray without pneumonia however awaiting COVID test at time of signout.  ____________________________________________   FINAL CLINICAL IMPRESSION(S) / ED DIAGNOSES  Final diagnoses:  Hyponatremia  Weakness     Note: This dictation was prepared with Dragon dictation. Any transcriptional errors that result from this process are unintentional     Nance Pear, MD 07/01/21 [phone]

## 2021-07-01 NOTE — ED Notes (Signed)
Pt with with run of vtach on monitor while sleeping. Denies CP or SOB. Alert and oriented X4. Will notify EDP

## 2021-07-01 NOTE — ED Notes (Addendum)
EDP at bedside. Aware pt tested positive for COVID

## 2021-07-01 NOTE — ED Triage Notes (Signed)
Pt to triage via w/c with no distress noted; pt reports has had several falls in the last few mos due to "balance" issues and weakness; pt denies any injuries or c/o pain

## 2021-07-01 NOTE — ED Provider Notes (Signed)
Patient is COVID-positive.  While on telemetry noted to have a several second run of V. tach while sleeping.  Is awoken and reverted to sinus rhythm denied any chest pain or shortness of breath currently.  As he is COVID-positive given his age risk factors and presentation will discuss with hospitalist for observation and further medical work-up.  have ordered IV remdesivir.   Merlyn Lot, MD 07/01/21 [phone]

## 2021-07-01 NOTE — ED Notes (Signed)
Pt had urinary incontinence, new linens and brief given. Wife at bedside.

## 2021-07-01 NOTE — Consult Note (Signed)
Remdesivir - Pharmacy Brief Note   O:  ALT: n/a CXR: Lower lung volumes.  No acute cardiopulmonary abnormality. SpO2: 98% on RA   A/P:  Remdesivir 200 mg IVPB once followed by 100 mg IVPB daily x 4 days.   Pearla Dubonnet, PharmD Clinical Pharmacist 07/01/2021 9:17 AM

## 2021-07-01 NOTE — ED Triage Notes (Signed)
EMS brings pt in from home for fall; family reports 3 falls today but pt denies

## 2021-07-02 DIAGNOSIS — J449 Chronic obstructive pulmonary disease, unspecified: Secondary | ICD-10-CM | POA: Diagnosis not present

## 2021-07-02 DIAGNOSIS — Y92009 Unspecified place in unspecified non-institutional (private) residence as the place of occurrence of the external cause: Secondary | ICD-10-CM | POA: Diagnosis not present

## 2021-07-02 DIAGNOSIS — U071 COVID-19: Secondary | ICD-10-CM | POA: Diagnosis not present

## 2021-07-02 DIAGNOSIS — W19XXXA Unspecified fall, initial encounter: Secondary | ICD-10-CM | POA: Diagnosis not present

## 2021-07-02 LAB — CBC WITH DIFFERENTIAL/PLATELET
Abs Immature Granulocytes: 0.02 10*3/uL (ref 0.00–0.07)
Basophils Absolute: 0 10*3/uL (ref 0.0–0.1)
Basophils Relative: 1 %
Eosinophils Absolute: 0.1 10*3/uL (ref 0.0–0.5)
Eosinophils Relative: 2 %
HCT: 41.1 % (ref 39.0–52.0)
Hemoglobin: 14.5 g/dL (ref 13.0–17.0)
Immature Granulocytes: 0 %
Lymphocytes Relative: 16 %
Lymphs Abs: 1.1 10*3/uL (ref 0.7–4.0)
MCH: 34 pg (ref 26.0–34.0)
MCHC: 35.3 g/dL (ref 30.0–36.0)
MCV: 96.3 fL (ref 80.0–100.0)
Monocytes Absolute: 1.1 10*3/uL — ABNORMAL HIGH (ref 0.1–1.0)
Monocytes Relative: 16 %
Neutro Abs: 4.4 10*3/uL (ref 1.7–7.7)
Neutrophils Relative %: 65 %
Platelets: 162 10*3/uL (ref 150–400)
RBC: 4.27 MIL/uL (ref 4.22–5.81)
RDW: 13.6 % (ref 11.5–15.5)
WBC: 6.8 10*3/uL (ref 4.0–10.5)
nRBC: 0 % (ref 0.0–0.2)

## 2021-07-02 LAB — COMPREHENSIVE METABOLIC PANEL
ALT: 45 U/L — ABNORMAL HIGH (ref 0–44)
AST: 50 U/L — ABNORMAL HIGH (ref 15–41)
Albumin: 2.8 g/dL — ABNORMAL LOW (ref 3.5–5.0)
Alkaline Phosphatase: 32 U/L — ABNORMAL LOW (ref 38–126)
Anion gap: 7 (ref 5–15)
BUN: 10 mg/dL (ref 8–23)
CO2: 24 mmol/L (ref 22–32)
Calcium: 8 mg/dL — ABNORMAL LOW (ref 8.9–10.3)
Chloride: 103 mmol/L (ref 98–111)
Creatinine, Ser: 0.68 mg/dL (ref 0.61–1.24)
GFR, Estimated: >60 mL/min (ref >60)
Glucose, Bld: 87 mg/dL (ref 70–99)
Potassium: 3.9 mmol/L (ref 3.5–5.1)
Sodium: 134 mmol/L — ABNORMAL LOW (ref 135–145)
Total Bilirubin: 0.7 mg/dL (ref 0.3–1.2)
Total Protein: 5.5 g/dL — ABNORMAL LOW (ref 6.5–8.1)

## 2021-07-02 LAB — C-REACTIVE PROTEIN: CRP: 12.4 mg/dL — ABNORMAL HIGH (ref <1.0)

## 2021-07-02 MED ORDER — MOLNUPIRAVIR EUA 200MG CAPSULE: 4.0000 | ORAL_CAPSULE | Freq: Two times a day (BID) | ORAL | 0 refills | Status: AC

## 2021-07-02 MED ORDER — DM-GUAIFENESIN ER 30-600 MG PO TB12: 1.0000 | ORAL_TABLET | Freq: Two times a day (BID) | ORAL | 0 refills | Status: AC | PRN

## 2021-07-02 NOTE — Progress Notes (Signed)
OT Cancellation Note  Patient Details Name: Eric Torres MRN: [id number] DOB: 05/16/1932   Cancelled Treatment:    Reason Eval/Treat Not Completed: Other (comment). Consult received, chart reviewed. Pt already has d/c summary in with HHPT ordered. Per MD via secure chat, MD states to skip OT evaluation at this time. OT orders completed.  Ardeth Perfect., MPH, MS, OTR/L ascom [phone] 07/02/21, 12:33 PM

## 2021-07-02 NOTE — Discharge Summary (Signed)
Physician Discharge Summary  Patient ID: Eric Torres MRN: [id number] DOB/AGE: [birth year] 85 y.o.  Admit date: 07/01/2021 Discharge date: 07/02/2021  Admission Diagnoses:  Discharge Diagnoses:  Principal Problem:   COVID-19 virus infection Active Problems:   HTN (hypertension)   History of CVA (cerebrovascular accident)   Fall at home, initial encounter   BPH (benign prostatic hyperplasia)   COPD (chronic obstructive pulmonary disease) (HCC)   Essential tremor   Hypothyroidism   HLD (hyperlipidemia)   Hyponatremia   Elevated troponin   NSVT (nonsustained ventricular tachycardia) ([place])   Discharged Condition: good  Hospital Course:  Eric Torres is a 87 y.o. male with medical history significant of hypertension, hyperlipidemia, COPD, stroke, TIA, hypothyroidism, PAD, essential tremor, BPH, collagenase colitis, who presents with fall, cough. Patient had a positive COVID PCR.  But he never developed hypoxemia.  He was given Molnupiravir monitored overnight. Currently, patient still has no symptoms.  Chest x-ray did not show pneumonia.  He is medically stable to be discharged.  I have set up home health with nurse and PT. Molnupiravir has given to the patient, he will complete the course.  Patient be followed with PCP within 1 week.     Consults: None  Significant Diagnostic Studies:   PORTABLE CHEST 1 VIEW   COMPARISON:  Chest radiographs 03/10/2021 and earlier.   FINDINGS: Portable AP upright view at 0652 hours. Lower lung volumes. Stable cardiac size and mediastinal contours. Heart size at the upper limits of normal. Visualized tracheal air column is within normal limits. Mild crowding of lung markings but otherwise Allowing for portable technique the lungs are clear. No pneumothorax or pleural effusion. Negative visible bowel gas pattern. No acute osseous abnormality identified.   IMPRESSION: Lower lung volumes.  No acute cardiopulmonary abnormality.      Electronically Signed   By: Genevie Ann M.D.   On: 07/01/2021 07:26    Treatments: Molnupiravir  Discharge Exam: Blood pressure 137/79, pulse (!) 58, temperature 97.6 F (36.4 C), temperature source Oral, resp. rate 13, height 5\' 7"  (1.702 m), weight 83.5 kg, SpO2 98 %. General appearance: alert and cooperative Resp: clear to auscultation bilaterally Cardio: regular rate and rhythm, S1, S2 normal, no murmur, click, rub or gallop GI: soft, non-tender; bowel sounds normal; no masses,  no organomegaly Extremities: extremities normal, atraumatic, no cyanosis or edema  Disposition: Discharge disposition: 01-Home or Self Care       Discharge Instructions     Diet - low sodium heart healthy   Complete by: As directed    Increase activity slowly   Complete by: As directed       Allergies as of 07/02/2021   No Known Allergies      Medication List     STOP taking these medications    doxycycline 100 MG capsule Commonly known as: VIBRAMYCIN   predniSONE 50 MG tablet Commonly known as: DELTASONE       TAKE these medications    albuterol 108 (90 Base) MCG/ACT inhaler Commonly known as: VENTOLIN HFA Inhale 1-2 puffs into the lungs every 6 (six) hours as needed for wheezing or shortness of breath.   aspirin 81 MG tablet Take 81 mg by mouth daily.   atorvastatin 80 MG tablet Commonly known as: LIPITOR Take 80 mg by mouth daily at 6 PM.   Bladder 2.2 Tabs Take 1 tablet by mouth 2 (two) times daily.   dextromethorphan-guaiFENesin 30-600 MG 12hr tablet Commonly known as: MUCINEX DM Take 1 tablet  by mouth 2 (two) times daily as needed for up to 4 days for cough.   finasteride 5 MG tablet Commonly known as: PROSCAR Take 5 mg by mouth at bedtime.   levothyroxine 112 MCG tablet Commonly known as: SYNTHROID Take 112 mcg by mouth at bedtime.   molnupiravir EUA 200 mg Caps capsule Commonly known as: LAGEVRIO Take 4 capsules (800 mg total) by mouth 2 (two) times  daily for 5 days.   pantoprazole 40 MG tablet Commonly known as: PROTONIX Take 1 tablet (40 mg total) by mouth daily. **PLEASE SCHEDULE FOLLOW UP APPT**   primidone 250 MG tablet Commonly known as: MYSOLINE Take 250 mg by mouth 2 (two) times daily with a meal.   propranolol 20 MG tablet Commonly known as: INDERAL Take 20 mg by mouth 2 (two) times daily.        Follow-up Information     Kirk Ruths, MD Follow up in 1 week(s).   Specialty: Internal Medicine Contact information: [facility] [place] [address] [phone]                 Signed: Sharen Hones 07/02/2021, 10:07 AM

## 2021-07-02 NOTE — Progress Notes (Signed)
PT Cancellation Note  Patient Details Name: Eric Torres MRN: [id number] DOB: 1932-06-01   Cancelled Treatment:    Reason Eval/Treat Not Completed: Other (comment) PT orders received, chart reviewed. Pt already has d/c summary in with HHPT ordered. Physician states to skip PT evaluation at this time. PT orders completed.  Lavone Nian, PT, DPT 07/02/21, 12:31 PM    Waunita Schooner 07/02/2021, 12:30 PM

## 2021-07-06 LAB — CULTURE, BLOOD (ROUTINE X 2)
Culture: NO GROWTH
Culture: NO GROWTH

## 2021-11-20 ENCOUNTER — Ambulatory Visit: Payer: Self-pay

## 2021-11-20 ENCOUNTER — Ambulatory Visit
Admission: EM | Admit: 2021-11-20 | Discharge: 2021-11-20 | Disposition: A | Discharge status: Home / Self Care | Payer: Medicare Other | Attending: Emergency Medicine | Admitting: Emergency Medicine

## 2021-11-20 ENCOUNTER — Other Ambulatory Visit: Payer: Self-pay

## 2021-11-20 DIAGNOSIS — E871 Hypo-osmolality and hyponatremia: Secondary | ICD-10-CM | POA: Diagnosis present

## 2021-11-20 DIAGNOSIS — E860 Dehydration: Secondary | ICD-10-CM | POA: Diagnosis not present

## 2021-11-20 LAB — COMPREHENSIVE METABOLIC PANEL
ALT: 21 U/L (ref 0–44)
AST: 33 U/L (ref 15–41)
Albumin: 3.4 g/dL — ABNORMAL LOW (ref 3.5–5.0)
Alkaline Phosphatase: 57 U/L (ref 38–126)
Anion gap: 8 (ref 5–15)
BUN: 18 mg/dL (ref 8–23)
CO2: 24 mmol/L (ref 22–32)
Calcium: 8.3 mg/dL — ABNORMAL LOW (ref 8.9–10.3)
Chloride: 97 mmol/L — ABNORMAL LOW (ref 98–111)
Creatinine, Ser: 1 mg/dL (ref 0.61–1.24)
GFR, Estimated: >60 mL/min (ref >60)
Glucose, Bld: 85 mg/dL (ref 70–99)
Potassium: 4.1 mmol/L (ref 3.5–5.1)
Sodium: 129 mmol/L — ABNORMAL LOW (ref 135–145)
Total Bilirubin: 0.5 mg/dL (ref 0.3–1.2)
Total Protein: 6.9 g/dL (ref 6.5–8.1)

## 2021-11-20 LAB — URINALYSIS, ROUTINE W REFLEX MICROSCOPIC
Bilirubin Urine: NEGATIVE
Glucose, UA: NEGATIVE mg/dL
Hgb urine dipstick: NEGATIVE
Ketones, ur: NEGATIVE mg/dL
Leukocytes,Ua: NEGATIVE
Nitrite: NEGATIVE
Protein, ur: NEGATIVE mg/dL
Specific Gravity, Urine: 1.01 (ref 1.005–1.030)
pH: 6.5 (ref 5.0–8.0)

## 2021-11-20 LAB — CBC WITH DIFFERENTIAL/PLATELET
Abs Immature Granulocytes: 0.03 10*3/uL (ref 0.00–0.07)
Basophils Absolute: 0.1 10*3/uL (ref 0.0–0.1)
Basophils Relative: 1 %
Eosinophils Absolute: 0.3 10*3/uL (ref 0.0–0.5)
Eosinophils Relative: 4 %
HCT: 42.5 % (ref 39.0–52.0)
Hemoglobin: 14.8 g/dL (ref 13.0–17.0)
Immature Granulocytes: 0 %
Lymphocytes Relative: 18 %
Lymphs Abs: 1.4 10*3/uL (ref 0.7–4.0)
MCH: 34 pg (ref 26.0–34.0)
MCHC: 34.8 g/dL (ref 30.0–36.0)
MCV: 97.7 fL (ref 80.0–100.0)
Monocytes Absolute: 0.9 10*3/uL (ref 0.1–1.0)
Monocytes Relative: 12 %
Neutro Abs: 5.1 10*3/uL (ref 1.7–7.7)
Neutrophils Relative %: 65 %
Platelets: 226 10*3/uL (ref 150–400)
RBC: 4.35 MIL/uL (ref 4.22–5.81)
RDW: 13.1 % (ref 11.5–15.5)
WBC: 7.9 10*3/uL (ref 4.0–10.5)
nRBC: 0 % (ref 0.0–0.2)

## 2021-11-20 MED ORDER — SODIUM CHLORIDE 0.9 % IV BOLUS
1000.0000 mL | Freq: Once | INTRAVENOUS | Status: AC
  Administered 2021-11-20: 1000 mL via INTRAVENOUS

## 2021-11-20 NOTE — ED Triage Notes (Signed)
Patient presents to Urgent Care with complaints of dizziness and weakness x 4 days. He just finished 2 rounds of antibiotics. Wife states he follows urology for freq UTIs. Wife states he does not have typical urinary symptoms, he usually begins to develop dizziness and weakness with UTIs.    Denies urinary symptoms or fever.

## 2021-11-20 NOTE — ED Notes (Signed)
Patient is resting comfortably. 

## 2021-11-20 NOTE — Discharge Instructions (Addendum)
You need to increase her oral fluid intake to a goal of at least 64 ounces of water a day.  Apply small amount of salt to your food, or eat sodium rich foods, to help maintain a normal sodium level.  Return for reevaluation for new or worsening symptoms.  You may also follow-up with your primary care provider regarding your hyponatremia as you will need to have your sodium levels monitored going forward.

## 2021-11-20 NOTE — ED Provider Notes (Signed)
[facility]    CSN: [id number] Arrival date & time: 11/20/21  1102      History   Chief Complaint Chief Complaint  Patient presents with   Dizziness   Weakness    HPI Eric Torres is a 88 y.o. male.   HPI  88 year old male here for evaluation of dizziness and weakness.  Patient has been experiencing dizziness and weakness for the last 3 weeks.  This is a typical presentation for urinary tract infections.  He has been through 2 rounds of antibiotics for UTIs and the family reports that while he is on the antibiotics he gets better but within days of stopping antibiotics his symptoms return.  He finished his last round of antibiotics 4 days ago.  His wife and his daughter both state that symptoms last night the night before were worse than at any point previous.  He has had mild runny nose and nasal congestion, and intermittent cough, and he has had diarrhea stools.  The diarrhea stools have been present since he had radioactive seeds placed for bladder cancer 4 years ago.  These have slightly improved with use of Kaopectate.  No nausea or vomiting, fever, or changes in appetite.  Per patient and family report he does not drink much in the way of fluids, maybe 5 glasses a day at best.  He does have a significant history to include dysphagia, Parkinson's disease, community-acquired pneumonia, aspiration pneumonia, hypertension, CVA, BPH, and NSVT.  He also is a history of hyponatremia in the past.  Past Medical History:  Diagnosis Date   Bilateral foot pain    LIKELY NEUROPATHY   BPH (benign prostatic hyperplasia)    Cancer (HCC)    ,HX OF PROSTATE CANCER AND BLADDER CANCER   Collagenous colitis    COPD (chronic obstructive pulmonary disease) (HCC)    MILD-NO INHALERS PER PT   Essential tremor    Frequent falls    Heart murmur    History of brachytherapy    History of TIA (transient ischemic attack) 2015   LEFT THUMB AND INDEX FINGER-SOMETIMES I DROP THINGS    Hypercholesteremia    Hypertension    Hypothyroidism    PVD (peripheral vascular disease) ([place])    Stroke ([place])    Vitamin D deficiency     Patient Active Problem List   Diagnosis Date Noted   COVID-19 virus infection 07/01/2021   Fall at home, initial encounter 07/01/2021   BPH (benign prostatic hyperplasia)    COPD (chronic obstructive pulmonary disease) (HCC)    Essential tremor    Hypothyroidism    HLD (hyperlipidemia)    Hyponatremia    Elevated troponin    NSVT (nonsustained ventricular tachycardia)    Dysphagia 06/07/2020   Parkinson's disease ([place]) 06/07/2020   Aspiration pneumonia ([place]) 06/06/2020   Acute respiratory failure with hypoxia ([place]) 06/06/2020   HTN (hypertension) 06/06/2020   History of CVA (cerebrovascular accident) 06/06/2020   Hypotension 06/06/2020   Tremors of nervous system 08/29/2019   CAP (community acquired pneumonia) 08/28/2019   Weakness generalized 08/28/2019   Tremor 08/28/2019    Past Surgical History:  Procedure Laterality Date   CHOLECYSTECTOMY     CYSTOSCOPY WITH BIOPSY N/A 12/29/2015   Procedure: CYSTOSCOPY WITH BIOPSY;  Surgeon: Royston Cowper, MD;  Location: [facility];  Service: Urology;  Laterality: N/A;   DIAGNOSTIC LAPAROSCOPY     ESOPHAGOGASTRODUODENOSCOPY (EGD) WITH PROPOFOL N/A 02/15/2019   Procedure: ESOPHAGOGASTRODUODENOSCOPY (EGD) WITH PROPOFOL;  Surgeon: Loistine Simas  U, MD;  Location: [facility];  Service: Endoscopy;  Laterality: N/A;   EXPLORATORY LAPAROTOMY  1991   VOLVULUS AND BOWEL OBSTRUCTION   HERNIA REPAIR     ILIAC ARTERY STENT Bilateral    TONSILLECTOMY     TRANSURETHRAL RESECTION OF BLADDER TUMOR N/A 12/29/2015   Procedure: TRANSURETHRAL RESECTION OF BLADDER TUMOR (TURBT)/MITOMYCIN INSTILLATION;  Surgeon: Royston Cowper, MD;  Location: [facility];  Service: Urology;  Laterality: N/A;   TRANSURETHRAL RESECTION OF BLADDER TUMOR WITH GYRUS (TURBT-GYRUS)  2013   VASECTOMY         Home Medications     Prior to Admission medications   Medication Sig Start Date End Date Taking? Authorizing Provider  albuterol (VENTOLIN HFA) 108 (90 Base) MCG/ACT inhaler Inhale 1-2 puffs into the lungs every 6 (six) hours as needed for wheezing or shortness of breath. Patient not taking: No sig reported 03/30/21   Coral Spikes, DO  aspirin 81 MG tablet Take 81 mg by mouth daily.    [provider]  atorvastatin (LIPITOR) 80 MG tablet Take 80 mg by mouth daily at 6 PM.    [provider]  finasteride (PROSCAR) 5 MG tablet Take 5 mg by mouth at bedtime.    [provider]  levothyroxine (SYNTHROID, LEVOTHROID) 112 MCG tablet Take 112 mcg by mouth at bedtime.     [provider]  Nutritional Supplements (BLADDER 2.2) TABS Take 1 tablet by mouth 2 (two) times daily.     [provider]  pantoprazole (PROTONIX) 40 MG tablet Take 1 tablet (40 mg total) by mouth daily. **PLEASE SCHEDULE FOLLOW UP APPT** 06/28/21   Lucilla Lame, MD  primidone (MYSOLINE) 250 MG tablet Take 250 mg by mouth 2 (two) times daily with a meal.    [provider]  propranolol (INDERAL) 20 MG tablet Take 20 mg by mouth 2 (two) times daily.     [provider]    Family History Family History  Family history unknown: Yes    Social History Social History   Tobacco Use   Smoking status: Former    Packs/day: 0.50    Years: 35.00    Pack years: 17.50    Types: Cigarettes    Quit date: 12/23/2011    Years since quitting: 9.9   Smokeless tobacco: Never  Vaping Use   Vaping Use: Never used  Substance Use Topics   Alcohol use: Yes    Comment: OCC   Drug use: No     Allergies   Patient has no known allergies.   Review of Systems Review of Systems  Constitutional:  Positive for fatigue. Negative for activity change, appetite change and fever.  HENT:  Positive for congestion and rhinorrhea.   Respiratory:  Positive for cough. Negative for shortness of breath.    Gastrointestinal:  Positive for diarrhea. Negative for nausea and vomiting.  Skin:  Negative for rash.  Neurological:  Positive for dizziness.  Hematological: Negative.   Psychiatric/Behavioral: Negative.      Physical Exam Triage Vital Signs ED Triage Vitals  Enc Vitals Group     BP 11/20/21 1126 126/66     Pulse Rate 11/20/21 1126 86     Resp 11/20/21 1126 16     Temp 11/20/21 1126 97.8 F (36.6 C)     Temp Source 11/20/21 1126 Oral     SpO2 11/20/21 1126 98 %     Weight --      Height --  Head Circumference --      Peak Flow --      Pain Score 11/20/21 1125 0     Pain Loc --      Pain Edu? --      Excl. in [place]? --    No data found.  Updated Vital Signs BP 126/66 (BP Location: Left Arm)    Pulse 86    Temp 97.8 F (36.6 C) (Oral)    Resp 16    SpO2 98%   Visual Acuity Right Eye Distance:   Left Eye Distance:   Bilateral Distance:    Right Eye Near:   Left Eye Near:    Bilateral Near:     Physical Exam Vitals and nursing note reviewed.  Constitutional:      Appearance: Normal appearance. He is ill-appearing.  HENT:     Head: Normocephalic and atraumatic.     Right Ear: Tympanic membrane, ear canal and external ear normal. There is no impacted cerumen.     Left Ear: Tympanic membrane, ear canal and external ear normal. There is no impacted cerumen.     Nose: Nose normal. No congestion or rhinorrhea.     Mouth/Throat:     Mouth: Mucous membranes are dry.     Pharynx: Oropharynx is clear.     Comments: Or mucous membranes are sticky and the patient's tongue is dry and fissured. Cardiovascular:     Rate and Rhythm: Normal rate and regular rhythm.     Pulses: Normal pulses.     Heart sounds: Normal heart sounds. No murmur heard.   No friction rub. No gallop.  Pulmonary:     Effort: Pulmonary effort is normal.     Breath sounds: Normal breath sounds. No wheezing, rhonchi or rales.  Musculoskeletal:     Cervical back: Normal range of motion and neck  supple.  Lymphadenopathy:     Cervical: No cervical adenopathy.  Skin:    General: Skin is warm and dry.     Capillary Refill: Capillary refill takes 2 to 3 seconds.     Coloration: Skin is pale.     Findings: No rash.  Neurological:     Mental Status: He is alert. Mental status is at baseline.  Psychiatric:        Mood and Affect: Mood normal.        Behavior: Behavior normal.        Thought Content: Thought content normal.        Judgment: Judgment normal.     UC Treatments / Results  Labs (all labs ordered are listed, but only abnormal results are displayed) Labs Reviewed  COMPREHENSIVE METABOLIC PANEL - Abnormal; Notable for the following components:      Result Value   Sodium 129 (*)    Chloride 97 (*)    Calcium 8.3 (*)    Albumin 3.4 (*)    All other components within normal limits  URINALYSIS, ROUTINE W REFLEX MICROSCOPIC  CBC WITH DIFFERENTIAL/PLATELET    EKG   Radiology No results found.  Procedures Procedures (including critical care time)  Medications Ordered in UC Medications  sodium chloride 0.9 % bolus 1,000 mL (0 mLs Intravenous Stopped 11/20/21 1500)    Initial Impression / Assessment and Plan / UC Course  I have reviewed the triage vital signs and the nursing notes.  Pertinent labs & imaging results that were available during my care of the patient were reviewed by me and considered in my medical decision  making (see chart for details).  Patient is a pleasant, though mildly ill-appearing, 88 year old male here with his wife and daughter for evaluation of dizziness and weakness that have worsened over the last 4 days.  These have been ongoing symptoms for last 3 weeks as the patient has been dealing with a UTI.  The UTI does seem to get better while on antibiotics and then once the patient goes off antibiotics his symptoms return.  His last round of antibiotics ended 4 days ago.  He has not been febrile or had a decrease in appetite.  No nausea or  vomiting.  He has diarrhea at baseline secondary to radiation seeds placed in his prostate for cancer 4 years ago.  This is typically managed with Kaopectate.  The patient does not drink fluids however.  Per patient and family report 5 glasses a day at most.  On exam patient's upper respiratory exam is largely benign with the exception of dry and sticky or mucous membranes and a dry fissured tongue.  No cervical adenopathy appreciated exam.  Cardiopulmonary exam reveals clear lung sounds in all fields.  Patient's skin turgor is quite poor and patient has decreased capillary refill and circulation in his fingers.  His nailbeds appear gray though he is satting at 98%.  The backs of his hands are pink in color and his cap refills proximately 2 to 3 seconds there.  Suspect that the discolored nailbeds are secondary to hypoperfusion secondary to dehydration.  Patient has been unable to give a urine sample.  Will give patient IV fluid bolus and check CBC and CMP.  Hopefully with IV fluid administration patient will be able to provide urine specimen for analysis.  CBC is unremarkable.  CMP shows hyponatremia with a sodium of 129.  Potassium is normal at 4.1.  Chloride is a little low at 97.  BUN and creatinine are normal at 18 and 1.0.  Calcium is mildly decreased at 8.3.  Albumin is mildly decreased at 3.4.  Transaminases are normal.  Urinalysis is completely unremarkable.  Will discharge patient home with a diagnosis of hyponatremia and mild dehydration.  I have encouraged him to increase his oral fluid intake and his family to also salt his food to help replace his sodium.  Most likely his hyponatremia and mild dehydration is secondary to his chronic diarrhea from his radiation therapy seeds.   Final Clinical Impressions(s) / UC Diagnoses   Final diagnoses:  Hyponatremia  Dehydration     Discharge Instructions      You need to increase her oral fluid intake to a goal of at least 64 ounces of water  a day.  Apply small amount of salt to your food, or eat sodium rich foods, to help maintain a normal sodium level.  Return for reevaluation for new or worsening symptoms.  You may also follow-up with your primary care provider regarding your hyponatremia as you will need to have your sodium levels monitored going forward.     ED Prescriptions   None    PDMP not reviewed this encounter.   Margarette Canada, NP 11/20/21 1534

## 2021-12-17 ENCOUNTER — Other Ambulatory Visit: Payer: Self-pay | Admitting: Gastroenterology

## 2022-01-29 ENCOUNTER — Ambulatory Visit
Admission: EM | Admit: 2022-01-29 | Discharge: 2022-01-29 | Disposition: A | Discharge status: Home / Self Care | Payer: Medicare Other | Attending: Physician Assistant | Admitting: Physician Assistant

## 2022-01-29 ENCOUNTER — Other Ambulatory Visit: Payer: Self-pay

## 2022-01-29 DIAGNOSIS — N390 Urinary tract infection, site not specified: Secondary | ICD-10-CM | POA: Diagnosis present

## 2022-01-29 DIAGNOSIS — R531 Weakness: Secondary | ICD-10-CM | POA: Diagnosis not present

## 2022-01-29 LAB — BASIC METABOLIC PANEL
Anion gap: 8 (ref 5–15)
BUN: 13 mg/dL (ref 8–23)
CO2: 27 mmol/L (ref 22–32)
Calcium: 8.5 mg/dL — ABNORMAL LOW (ref 8.9–10.3)
Chloride: 100 mmol/L (ref 98–111)
Creatinine, Ser: 0.95 mg/dL (ref 0.61–1.24)
GFR, Estimated: >60 mL/min (ref >60)
Glucose, Bld: 103 mg/dL — ABNORMAL HIGH (ref 70–99)
Potassium: 3.8 mmol/L (ref 3.5–5.1)
Sodium: 135 mmol/L (ref 135–145)

## 2022-01-29 LAB — CBC WITH DIFFERENTIAL/PLATELET
Abs Immature Granulocytes: 0.04 10*3/uL (ref 0.00–0.07)
Basophils Absolute: 0.1 10*3/uL (ref 0.0–0.1)
Basophils Relative: 1 %
Eosinophils Absolute: 0.4 10*3/uL (ref 0.0–0.5)
Eosinophils Relative: 4 %
HCT: 40.6 % (ref 39.0–52.0)
Hemoglobin: 14.3 g/dL (ref 13.0–17.0)
Immature Granulocytes: 0 %
Lymphocytes Relative: 14 %
Lymphs Abs: 1.3 10*3/uL (ref 0.7–4.0)
MCH: 34 pg (ref 26.0–34.0)
MCHC: 35.2 g/dL (ref 30.0–36.0)
MCV: 96.7 fL (ref 80.0–100.0)
Monocytes Absolute: 1 10*3/uL (ref 0.1–1.0)
Monocytes Relative: 11 %
Neutro Abs: 6.5 10*3/uL (ref 1.7–7.7)
Neutrophils Relative %: 70 %
Platelets: 228 10*3/uL (ref 150–400)
RBC: 4.2 MIL/uL — ABNORMAL LOW (ref 4.22–5.81)
RDW: 13.6 % (ref 11.5–15.5)
WBC: 9.3 10*3/uL (ref 4.0–10.5)
nRBC: 0 % (ref 0.0–0.2)

## 2022-01-29 LAB — URINALYSIS, MICROSCOPIC (REFLEX)
Bacteria, UA: NONE SEEN
RBC / HPF: NONE SEEN RBC/hpf (ref 0–5)

## 2022-01-29 LAB — URINALYSIS, ROUTINE W REFLEX MICROSCOPIC
Bilirubin Urine: NEGATIVE
Glucose, UA: NEGATIVE mg/dL
Hgb urine dipstick: NEGATIVE
Ketones, ur: NEGATIVE mg/dL
Nitrite: NEGATIVE
Protein, ur: NEGATIVE mg/dL
Specific Gravity, Urine: 1.01 (ref 1.005–1.030)
pH: 5.5 (ref 5.0–8.0)

## 2022-01-29 MED ORDER — SULFAMETHOXAZOLE-TRIMETHOPRIM 800-160 MG PO TABS: 1.0000 | ORAL_TABLET | Freq: Two times a day (BID) | ORAL | 0 refills | Status: AC

## 2022-01-29 NOTE — ED Provider Notes (Signed)
?[facility] ? ? ? ?CSN: [id number] ?Arrival date & time: 01/29/22  1250 ? ? ?  ? ?History   ?Chief Complaint ?Chief Complaint  ?Patient presents with  ? Dysuria  ? ? ?HPI ?Eric Torres is a 88 y.o. male.  ? ?Patient presents today with a weeklong history of dizziness and generalized weakness.  He reports this is a common presentation for UTI which he has chronically.  He denies any dysuria, urinary frequency, urinary urgency, fever, nausea, vomiting.  His wife administered an at-home UTI test that was positive prompting evaluation today.  He was last seen by our clinic in February 2023 at which point he was noted to be hyponatremic and given fluids.  He is followed by urology but has not seen them recently.  He denies prophylactic antibiotic use due to recurrent UTI.  He does have a history of chronic diarrhea following radiation seed placement for prostate cancer several years ago and believes this contributes to symptoms.  Last antibiotic use was several months ago.  He denies any history of diabetes or SGLT2 inhibitor use.  Reportedly, there is no recent urine culture results available in care everywhere.  Last result showed E. coli in 2022 that was pansensitive. ? ? ?Past Medical History:  ?Diagnosis Date  ? Bilateral foot pain   ? LIKELY NEUROPATHY  ? BPH (benign prostatic hyperplasia)   ? Cancer [facility])   ? ,HX OF PROSTATE CANCER AND BLADDER CANCER  ? Collagenous colitis   ? COPD (chronic obstructive pulmonary disease) ([place])   ? MILD-NO INHALERS PER PT  ? Essential tremor   ? Frequent falls   ? Heart murmur   ? History of brachytherapy   ? History of TIA (transient ischemic attack) 2015  ? LEFT THUMB AND INDEX FINGER-SOMETIMES I DROP THINGS  ? Hypercholesteremia   ? Hypertension   ? Hypothyroidism   ? PVD (peripheral vascular disease) ([place])   ? Stroke [facility])   ? Vitamin D deficiency   ? ? ?Patient Active Problem List  ? Diagnosis Date Noted  ? COVID-19 virus infection 07/01/2021  ? Fall at home,  initial encounter 07/01/2021  ? BPH (benign prostatic hyperplasia)   ? COPD (chronic obstructive pulmonary disease) ([place])   ? Essential tremor   ? Hypothyroidism   ? HLD (hyperlipidemia)   ? Hyponatremia   ? Elevated troponin   ? NSVT (nonsustained ventricular tachycardia) ([place])   ? Dysphagia 06/07/2020  ? Parkinson's disease ([place]) 06/07/2020  ? Aspiration pneumonia ([place]) 06/06/2020  ? Acute respiratory failure with hypoxia ([place]) 06/06/2020  ? HTN (hypertension) 06/06/2020  ? History of CVA (cerebrovascular accident) 06/06/2020  ? Hypotension 06/06/2020  ? Tremors of nervous system 08/29/2019  ? CAP (community acquired pneumonia) 08/28/2019  ? Weakness generalized 08/28/2019  ? Tremor 08/28/2019  ? ? ?Past Surgical History:  ?Procedure Laterality Date  ? CHOLECYSTECTOMY    ? CYSTOSCOPY WITH BIOPSY N/A 12/29/2015  ? Procedure: CYSTOSCOPY WITH BIOPSY;  Surgeon: Royston Cowper, MD;  Location: [facility] ORS;  Service: Urology;  Laterality: N/A;  ? DIAGNOSTIC LAPAROSCOPY    ? ESOPHAGOGASTRODUODENOSCOPY (EGD) WITH PROPOFOL N/A 02/15/2019  ? Procedure: ESOPHAGOGASTRODUODENOSCOPY (EGD) WITH PROPOFOL;  Surgeon: Lollie Sails, MD;  Location: [facility] ENDOSCOPY;  Service: Endoscopy;  Laterality: N/A;  ? EXPLORATORY LAPAROTOMY  1991  ? VOLVULUS AND BOWEL OBSTRUCTION  ? HERNIA REPAIR    ? ILIAC ARTERY STENT Bilateral   ? TONSILLECTOMY    ? TRANSURETHRAL RESECTION OF BLADDER TUMOR  N/A 12/29/2015  ? Procedure: TRANSURETHRAL RESECTION OF BLADDER TUMOR (TURBT)/MITOMYCIN INSTILLATION;  Surgeon: Royston Cowper, MD;  Location: [facility] ORS;  Service: Urology;  Laterality: N/A;  ? TRANSURETHRAL RESECTION OF BLADDER TUMOR WITH GYRUS (TURBT-GYRUS)  2013  ? VASECTOMY    ? ? ? ? ? ?Home Medications   ? ?Prior to Admission medications   ?Medication Sig Start Date End Date Taking? Authorizing Provider  ?sulfamethoxazole-trimethoprim (BACTRIM DS) 800-160 MG tablet Take 1 tablet by mouth 2 (two) times daily for 7 days. 01/29/22 02/05/22 Yes Giada Schoppe K,  PA-C  ?albuterol (VENTOLIN HFA) 108 (90 Base) MCG/ACT inhaler Inhale 1-2 puffs into the lungs every 6 (six) hours as needed for wheezing or shortness of breath. ?Patient not taking: No sig reported 03/30/21   Coral Spikes, DO  ?aspirin 81 MG tablet Take 81 mg by mouth daily.    [provider]  ?atorvastatin (LIPITOR) 80 MG tablet Take 80 mg by mouth daily at 6 PM.    [provider]  ?finasteride (PROSCAR) 5 MG tablet Take 5 mg by mouth at bedtime.    [provider]  ?levothyroxine (SYNTHROID, LEVOTHROID) 112 MCG tablet Take 112 mcg by mouth at bedtime.     [provider]  ?Nutritional Supplements (BLADDER 2.2) TABS Take 1 tablet by mouth 2 (two) times daily.     [provider]  ?pantoprazole (PROTONIX) 40 MG tablet Take 1 tablet (40 mg total) by mouth daily. MUST SCHEDULE OFFICE VISIT 12/20/21   Lucilla Lame, MD  ?primidone (MYSOLINE) 250 MG tablet Take 250 mg by mouth 2 (two) times daily with a meal.    [provider]  ?propranolol (INDERAL) 20 MG tablet Take 20 mg by mouth 2 (two) times daily.     [provider]  ? ? ?Family History ?Family History  ?Family history unknown: Yes  ? ? ?Social History ?Social History  ? ?Tobacco Use  ? Smoking status: Former  ?  Packs/day: 0.50  ?  Years: 35.00  ?  Pack years: 17.50  ?  Types: Cigarettes  ?  Quit date: 12/23/2011  ?  Years since quitting: 10.1  ? Smokeless tobacco: Never  ?Vaping Use  ? Vaping Use: Never used  ?Substance Use Topics  ? Alcohol use: Yes  ?  Comment: OCC  ? Drug use: No  ? ? ? ?Allergies   ?Patient has no known allergies. ? ? ?Review of Systems ?Review of Systems  ?Constitutional:  Positive for activity change. Negative for appetite change, fatigue and fever.  ?Respiratory:  Negative for cough and shortness of breath.   ?Cardiovascular:  Negative for chest pain.  ?Gastrointestinal:  Negative for abdominal pain, diarrhea, nausea and vomiting.  ?Genitourinary:  Negative for dysuria,  frequency and urgency.  ?Neurological:  Positive for weakness (Generalized). Negative for dizziness, light-headedness and headaches.  ? ? ?Physical Exam ?Triage Vital Signs ?ED Triage Vitals [01/29/22 1302]  ?Enc Vitals Group  ?   BP (!) 160/73  ?   Pulse Rate 72  ?   Resp 18  ?   Temp (!) 97.5 ?F (36.4 ?C)  ?   Temp Source Oral  ?   SpO2 98 %  ?   Weight   ?   Height   ?   Head Circumference   ?   Peak Flow   ?   Pain Score 0  ?   Pain Loc   ?   Pain Edu?   ?  Excl. in [place]?   ? ?No data found. ? ?Updated Vital Signs ?BP 134/69 (BP Location: Left Arm)   Pulse 69   Temp 98 ?F (36.7 ?C) (Oral)   Resp 20   SpO2 99%  ? ?Visual Acuity ?Right Eye Distance:   ?Left Eye Distance:   ?Bilateral Distance:   ? ?Right Eye Near:   ?Left Eye Near:    ?Bilateral Near:    ? ?Physical Exam ?Vitals reviewed.  ?Constitutional:   ?   General: He is awake.  ?   Appearance: Normal appearance. He is well-developed. He is not ill-appearing.  ?   Comments: Very pleasant male appears stated age sitting in exam room in no acute distress  ?HENT:  ?   Head: Normocephalic and atraumatic.  ?   Mouth/Throat:  ?   Pharynx: Uvula midline. No oropharyngeal exudate, posterior oropharyngeal erythema or uvula swelling.  ?Cardiovascular:  ?   Rate and Rhythm: Normal rate and regular rhythm.  ?   Heart sounds: Normal heart sounds, S1 normal and S2 normal. No murmur heard. ?Pulmonary:  ?   Effort: Pulmonary effort is normal.  ?   Breath sounds: Normal breath sounds. No stridor. No wheezing, rhonchi or rales.  ?   Comments: Clear to auscultation bilaterally ?Abdominal:  ?   General: Bowel sounds are normal.  ?   Palpations: Abdomen is soft.  ?   Tenderness: There is no abdominal tenderness. There is no right CVA tenderness, left CVA tenderness, guarding or rebound.  ?Neurological:  ?   Mental Status: He is alert.  ?Psychiatric:     ?   Behavior: Behavior is cooperative.  ? ? ? ?UC Treatments / Results  ?Labs ?(all labs ordered are listed, but only  abnormal results are displayed) ?Labs Reviewed  ?URINALYSIS, ROUTINE W REFLEX MICROSCOPIC - Abnormal; Notable for the following components:  ?    Result Value  ? Leukocytes,Ua SMALL (*)   ? All other components within

## 2022-01-29 NOTE — ED Triage Notes (Signed)
Pt present UTI, pt states that he just have drops of urine that continues to drop. Symptoms started a week ago.  ?

## 2022-01-29 NOTE — Discharge Instructions (Signed)
Your blood work looked great.  Your urine was not convincing for urinary tract infection but given your history were going to start a antibiotic.  I have sent this off for culture and if we need to change or stop your antibiotic we will contact you.  Make sure you are drinking plenty of fluid.  If anything worsens and you have high fever, weakness, chest pain, shortness of breath, nausea/vomiting you need to be seen immediately.  Follow-up with your PCP first thing next week. ?

## 2022-01-31 LAB — URINE CULTURE

## 2022-02-23 ENCOUNTER — Other Ambulatory Visit: Payer: Self-pay | Admitting: Neurology

## 2022-02-23 DIAGNOSIS — G3184 Mild cognitive impairment, so stated: Secondary | ICD-10-CM

## 2022-03-03 ENCOUNTER — Ambulatory Visit
Admission: RE | Admit: 2022-03-03 | Discharge: 2022-03-03 | Disposition: A | Discharge status: Home / Self Care | Payer: Medicare Other | Source: Ambulatory Visit | Attending: Neurology | Admitting: Neurology

## 2022-03-03 DIAGNOSIS — G3184 Mild cognitive impairment, so stated: Secondary | ICD-10-CM | POA: Insufficient documentation

## 2022-03-14 ENCOUNTER — Encounter: Payer: Self-pay | Admitting: Gastroenterology

## 2022-03-14 ENCOUNTER — Ambulatory Visit (INDEPENDENT_AMBULATORY_CARE_PROVIDER_SITE_OTHER): Payer: Medicare Other | Admitting: Gastroenterology

## 2022-03-14 VITALS — BP 136/61 | HR 56 | Temp 97.6°F

## 2022-03-14 DIAGNOSIS — R197 Diarrhea, unspecified: Secondary | ICD-10-CM | POA: Diagnosis not present

## 2022-03-14 MED ORDER — PANTOPRAZOLE SODIUM 40 MG PO TBEC: 40.0000 mg | DELAYED_RELEASE_TABLET | Freq: Every day | ORAL | 3 refills | Status: DC

## 2022-03-14 MED ORDER — LOPERAMIDE HCL 1 MG/7.5ML PO LIQD: 1.0000 mg | ORAL | 0 refills | Status: DC | PRN

## 2022-03-14 NOTE — Progress Notes (Signed)
Primary Care Physician: Kirk Ruths, MD  Primary Gastroenterologist:  Dr. Lucilla Lame  Chief Complaint  Patient presents with   Diarrhea    HPI: Eric Torres is a 88 y.o. male here for follow-up with a history of seeing me in the past for dysphagia.  The patient was found to have esophageal dysmotility.  The patient now comes in with a report of diarrhea.  The patient states that the diarrhea has been going on for many years and the last time he had seen me he was told to take Imodium and titrated to effect.  The patient has been taking Imodium 3 times a day but switch to Kaopectate and states that he is now having bowel movements every 3 days and when he does they are liquidy.  The patient does report that he has milk on a regular basis with cereal in the morning and some ice cream intermittently.  The patient is on Protonix and reports that keeps his acid under control.  Past Medical History:  Diagnosis Date   Bilateral foot pain    LIKELY NEUROPATHY   BPH (benign prostatic hyperplasia)    Cancer (HCC)    ,HX OF PROSTATE CANCER AND BLADDER CANCER   Collagenous colitis    COPD (chronic obstructive pulmonary disease) (HCC)    MILD-NO INHALERS PER PT   Essential tremor    Frequent falls    Heart murmur    History of brachytherapy    History of TIA (transient ischemic attack) 2015   LEFT THUMB AND INDEX FINGER-SOMETIMES I DROP THINGS   Hypercholesteremia    Hypertension    Hypothyroidism    PVD (peripheral vascular disease) (HCC)    Stroke (HCC)    Vitamin D deficiency     Current Outpatient Medications  Medication Sig Dispense Refill   albuterol (VENTOLIN HFA) 108 (90 Base) MCG/ACT inhaler Inhale 1-2 puffs into the lungs every 6 (six) hours as needed for wheezing or shortness of breath. 18 g 0   aspirin 81 MG tablet Take 81 mg by mouth daily.     atorvastatin (LIPITOR) 80 MG tablet Take 80 mg by mouth daily at 6 PM.     finasteride (PROSCAR) 5 MG tablet Take 5  mg by mouth at bedtime.     levothyroxine (SYNTHROID, LEVOTHROID) 112 MCG tablet Take 112 mcg by mouth at bedtime.      Nutritional Supplements (BLADDER 2.2) TABS Take 1 tablet by mouth 2 (two) times daily.      primidone (MYSOLINE) 50 MG tablet Take by mouth.     propranolol (INDERAL) 20 MG tablet Take 20 mg by mouth 2 (two) times daily.      Loperamide HCl 1 MG/7.5ML LIQD Take 7.5 mLs (1 mg total) by mouth every 4 (four) hours as needed. 236 mL 0   pantoprazole (PROTONIX) 40 MG tablet Take 1 tablet (40 mg total) by mouth daily. MUST SCHEDULE OFFICE VISIT 90 tablet 3   No current facility-administered medications for this visit.    Allergies as of 03/14/2022   (No Known Allergies)    ROS:  General: Negative for anorexia, weight loss, fever, chills, fatigue, weakness. ENT: Negative for hoarseness, difficulty swallowing , nasal congestion. CV: Negative for chest pain, angina, palpitations, dyspnea on exertion, peripheral edema.  Respiratory: Negative for dyspnea at rest, dyspnea on exertion, cough, sputum, wheezing.  GI: See history of present illness. GU:  Negative for dysuria, hematuria, urinary incontinence, urinary frequency, nocturnal urination.  Endo: Negative for unusual weight change.    Physical Examination:   BP 136/61   Pulse (!) 56   Temp 97.6 F (36.4 C) (Oral)   General: Well-nourished, well-developed in no acute distress.  Eyes: No icterus. Conjunctivae pink. Neuro: Alert and oriented x 3.  Grossly intact. Psych: Alert and cooperative, normal mood and affect.  Labs:    Imaging Studies: MR BRAIN WO CONTRAST  Result Date: 03/04/2022 CLINICAL DATA:  Mild cognitive impairment and memory loss EXAM: MRI HEAD WITHOUT CONTRAST TECHNIQUE: Multiplanar, multiecho pulse sequences of the brain and surrounding structures were obtained without intravenous contrast. COMPARISON:  07/30/2019 FINDINGS: Brain: No restricted diffusion to suggest acute or subacute infarct. No acute  hemorrhage, mass, mass effect, or midline shift. No hemosiderin deposition to suggest remote hemorrhage. Ventriculomegaly which appears commensurate with degree of cerebral volume loss. Slightly disproportionate volume loss in the left parietal lobe. Confluent T2 hyperintense signal in the periventricular white matter, likely the sequela of severe chronic small vessel ischemic disease. Redemonstrated left cerebellar infarct Vascular: Normal flow voids. Skull and upper cervical spine: Normal marrow signal. Sinuses/Orbits: No acute finding. Other: Fluid in the left mastoid air cells. IMPRESSION: 1. No acute intracranial process. No evidence of acute or subacute infarct. 2. Degree of cerebral volume loss is advanced for age with slightly more advanced atrophy in the left parietal lobe. Electronically Signed   By: Merilyn Baba M.D.   On: 03/04/2022 02:54    Assessment and Plan:   Eric Torres is a 88 y.o. y/o male who comes in today with a history of dysphagia with esophageal dysmotility.  The patient has had continued diarrhea although less frequent since he has been taking antidiarrheal medication.  The patient has been told to stop the dairy products for 1 week to see if his symptoms improve.  He has also been told that if it does not improve then he should go back on the milk products and go up on his Imodium.  The patient has been given a refill for the pantoprazole and for his Imodium.  The patient and his family have been explained the plan and agrees with it.     Lucilla Lame, MD. Marval Regal    Note: This dictation was prepared with Dragon dictation along with smaller phrase technology. Any transcriptional errors that result from this process are unintentional.

## 2022-03-15 ENCOUNTER — Telehealth: Payer: Self-pay

## 2022-03-15 NOTE — Telephone Encounter (Signed)
Pt's wife call to report pt had dark stool diarrhea yesterday after OV.... However pt did take Kaopectate yesterday...   Did advise that the anti-diarrhea medication can cause dark stools, also to monitor stools going forward to see if they remain dark and other factors, new Sx etc....  Any further advice needed? Please advise.Marland KitchenMarland Kitchen

## 2022-03-27 ENCOUNTER — Other Ambulatory Visit: Payer: Self-pay

## 2022-03-27 ENCOUNTER — Ambulatory Visit
Admission: EM | Admit: 2022-03-27 | Discharge: 2022-03-27 | Disposition: A | Discharge status: Home / Self Care | Payer: Medicare Other | Attending: Emergency Medicine | Admitting: Emergency Medicine

## 2022-03-27 ENCOUNTER — Emergency Department
Admission: EM | Admit: 2022-03-27 | Discharge: 2022-03-27 | Disposition: A | Discharge status: Home / Self Care | Payer: Medicare Other | Attending: Emergency Medicine | Admitting: Emergency Medicine

## 2022-03-27 ENCOUNTER — Encounter: Payer: Self-pay | Admitting: Emergency Medicine

## 2022-03-27 ENCOUNTER — Emergency Department: Payer: Medicare Other

## 2022-03-27 DIAGNOSIS — W19XXXA Unspecified fall, initial encounter: Secondary | ICD-10-CM

## 2022-03-27 DIAGNOSIS — S0990XA Unspecified injury of head, initial encounter: Secondary | ICD-10-CM

## 2022-03-27 DIAGNOSIS — R55 Syncope and collapse: Secondary | ICD-10-CM

## 2022-03-27 DIAGNOSIS — E860 Dehydration: Secondary | ICD-10-CM | POA: Diagnosis not present

## 2022-03-27 DIAGNOSIS — S0001XA Abrasion of scalp, initial encounter: Secondary | ICD-10-CM | POA: Insufficient documentation

## 2022-03-27 DIAGNOSIS — Y92002 Bathroom of unspecified non-institutional (private) residence single-family (private) house as the place of occurrence of the external cause: Secondary | ICD-10-CM | POA: Diagnosis not present

## 2022-03-27 DIAGNOSIS — R42 Dizziness and giddiness: Secondary | ICD-10-CM | POA: Diagnosis not present

## 2022-03-27 DIAGNOSIS — W01198A Fall on same level from slipping, tripping and stumbling with subsequent striking against other object, initial encounter: Secondary | ICD-10-CM | POA: Diagnosis not present

## 2022-03-27 HISTORY — DX: Raynaud's syndrome without gangrene: I73.00

## 2022-03-27 HISTORY — DX: Dizziness and giddiness: R42

## 2022-03-27 HISTORY — DX: Malignant (primary) neoplasm, unspecified: C80.1

## 2022-03-27 HISTORY — DX: Cerebral infarction, unspecified: I63.9

## 2022-03-27 LAB — TROPONIN I (HIGH SENSITIVITY): Troponin I (High Sensitivity): 13 ng/L (ref <18)

## 2022-03-27 LAB — CBC WITH DIFFERENTIAL/PLATELET
Abs Immature Granulocytes: 0.04 10*3/uL (ref 0.00–0.07)
Basophils Absolute: 0.1 10*3/uL (ref 0.0–0.1)
Basophils Relative: 1 %
Eosinophils Absolute: 0.1 10*3/uL (ref 0.0–0.5)
Eosinophils Relative: 1 %
HCT: 44.8 % (ref 39.0–52.0)
Hemoglobin: 15.7 g/dL (ref 13.0–17.0)
Immature Granulocytes: 0 %
Lymphocytes Relative: 13 %
Lymphs Abs: 1.3 10*3/uL (ref 0.7–4.0)
MCH: 33.4 pg (ref 26.0–34.0)
MCHC: 35 g/dL (ref 30.0–36.0)
MCV: 95.3 fL (ref 80.0–100.0)
Monocytes Absolute: 0.9 10*3/uL (ref 0.1–1.0)
Monocytes Relative: 9 %
Neutro Abs: 7.6 10*3/uL (ref 1.7–7.7)
Neutrophils Relative %: 76 %
Platelets: 207 10*3/uL (ref 150–400)
RBC: 4.7 MIL/uL (ref 4.22–5.81)
RDW: 12.8 % (ref 11.5–15.5)
WBC: 10.1 10*3/uL (ref 4.0–10.5)
nRBC: 0 % (ref 0.0–0.2)

## 2022-03-27 LAB — BASIC METABOLIC PANEL
Anion gap: 10 (ref 5–15)
BUN: 15 mg/dL (ref 8–23)
CO2: 27 mmol/L (ref 22–32)
Calcium: 9.1 mg/dL (ref 8.9–10.3)
Chloride: 99 mmol/L (ref 98–111)
Creatinine, Ser: 0.94 mg/dL (ref 0.61–1.24)
GFR, Estimated: >60 mL/min (ref >60)
Glucose, Bld: 98 mg/dL (ref 70–99)
Potassium: 4.3 mmol/L (ref 3.5–5.1)
Sodium: 136 mmol/L (ref 135–145)

## 2022-03-27 NOTE — Discharge Instructions (Signed)
Use Tylenol for pain and fevers.  Up to 1000 mg per dose, up to 4 times per day.  Do not take more than 4000 mg of Tylenol/acetaminophen within 24 hours..  

## 2022-03-27 NOTE — ED Provider Triage Note (Signed)
Emergency Medicine Provider Triage Evaluation Note  Eric Torres , a 88 y.o. male with a history of iliac artery stents on ASA, bladder cancer, prostate cancer (not currently undergoing treatment), TIA/CVA, vertigo, neuropathy was evaluated in triage.  Pt complains of fall. Patient reports that he felt dizzy and lost his balance and struck his head. No LOC. Able to get up without assistance. Wife reports that he falls often because of a "balance problem."  No dizziness now. Takes baby aspirin.  There are no problems to display for this patient.    Review of Systems  Positive: Dizzy, headache Negative: Chest pain, SOB  Physical Exam  There were no vitals taken for this visit. Gen:   Awake, no distress   Resp:  Normal effort  MSK:   Moves extremities without difficulty  Other:  Superficial abrasion to scalp  Medical Decision Making  Medically screening exam initiated at 11:31 AM.  Appropriate orders placed.  Eric Torres was informed that the remainder of the evaluation will be completed by another provider, this initial triage assessment does not replace that evaluation, and the importance of remaining in the ED until their evaluation is complete.     Eric Old, PA-C 03/27/22 1137

## 2022-03-27 NOTE — ED Triage Notes (Signed)
Pt states he fell this AM and hit his head- pt denies blood thinner use and denies LOC- pt states he got dizzy getting off the toilet and he fell

## 2022-03-27 NOTE — ED Provider Notes (Signed)
[facility] Provider Note    Event Date/Time   First MD Initiated Contact with Patient 03/27/22 1346     (approximate)   History   Fall   HPI  Eric Torres is a 88 y.o. male who presents to the ED for evaluation of Fall   Patient presents to the ED for evaluation of an accidental fall and head injury.  He reports after getting up from the toilet he felt "wobbly" he took a few steps but his legs gave out and he fell back and struck his head.  He denies any syncope or chest pain.  Wife reports visualizing the whole incident and says he often does this and is fallen multiple times in the past few years in a similar fashion.  No diarrhea or significant fluid losses.   Physical Exam   Triage Vital Signs: ED Triage Vitals  Enc Vitals Group     BP 03/27/22 1133 (!) 141/67     Pulse Rate 03/27/22 1133 70     Resp 03/27/22 1133 18     Temp 03/27/22 1133 97.7 F (36.5 C)     Temp Source 03/27/22 1133 Oral     SpO2 03/27/22 1133 94 %     Weight 03/27/22 1132 173 lb (78.5 kg)     Height 03/27/22 1132 '5\' 8"'$  (1.727 m)     Head Circumference --      Peak Flow --      Pain Score 03/27/22 1131 3     Pain Loc --      Pain Edu? --      Excl. in [place]? --     Most recent vital signs: Vitals:   03/27/22 1133 03/27/22 1443  BP: (!) 141/67 140/70  Pulse: 70   Resp: 18 17  Temp: 97.7 F (36.5 C) 98.2 F (36.8 C)  SpO2: 94% 96%    General: Awake, no distress.  Pleasant and conversational. CV:  Good peripheral perfusion.  Resp:  Normal effort.  Abd:  No distention.  MSK:  No deformity noted.  Neuro:  No focal deficits appreciated. Please take Tylenol and ibuprofen/Advil for your pain.  It is safe to take them together, or to alternate them every few hours.  Take up to '1000mg'$  of Tylenol at a time, up to 4 times per day.  Do not take more than 4000 mg of Tylenol in 24 hours.  For ibuprofen, take 400-600 mg, 3 - 4 times per day. Other:  Small superficial  abrasion to the occiput with a small volume dried blood in the scalp around this with no active bleeding, discrete laceration or any thing to require repair   ED Results / Procedures / Treatments   Labs (all labs ordered are listed, but only abnormal results are displayed) Labs Reviewed  CBC WITH DIFFERENTIAL/PLATELET  BASIC METABOLIC PANEL  TROPONIN I (HIGH SENSITIVITY)  TROPONIN I (HIGH SENSITIVITY)    EKG Sinus rhythm, rate of 68 bpm.  Normal axis and intervals.  No evidence of acute ischemia.  RADIOLOGY CT head interpreted by me without evidence of acute intracranial pathology CT cervical spine interpreted by me without evidence of acute fracture  Official radiology report(s): CT Cervical Spine Wo Contrast  Result Date: 03/27/2022 CLINICAL DATA:  Fall this morning from toilet.  Dizziness. EXAM: CT HEAD WITHOUT CONTRAST CT CERVICAL SPINE WITHOUT CONTRAST TECHNIQUE: Multidetector CT imaging of the head and cervical spine was performed following the standard protocol without intravenous contrast. Multiplanar  CT image reconstructions of the cervical spine were also generated. RADIATION DOSE REDUCTION: This exam was performed according to the departmental dose-optimization program which includes automated exposure control, adjustment of the mA and/or kV according to patient size and/or use of iterative reconstruction technique. COMPARISON:  11/29/2020 and head CT 07/01/2021 FINDINGS: CT HEAD FINDINGS Brain: Exam demonstrates mild stable prominence of the ventricles compatible with age related atrophic change. CSF spaces and cisterns are within normal. There is chronic ischemic microvascular disease present. Old left cerebellar infarct. No mass, mass effect, shift of midline structures or acute hemorrhage. No evidence of acute infarction. Vascular: No hyperdense vessel or unexpected calcification. Skull: Normal. Negative for fracture or focal lesion. Sinuses/Orbits: No acute finding. Other:  None. CT CERVICAL SPINE FINDINGS Alignment: Normal. Skull base and vertebrae: Vertebral body heights are normal. There is moderate spondylosis of the cervical spine to include uncovertebral joint spurring and facet arthropathy. Degenerative change of the atlantoaxial articulation. Mild bilateral neural foraminal narrowing at the C2-3 level. Moderate bilateral neural foraminal narrowing at the C3-4 level, mild bilateral neural foraminal narrowing at the C4-5 level, moderate right-sided neural foraminal narrowing at the C5-6 level, moderate bilateral neural foraminal narrowing at the C6-7 level. No acute fracture. Soft tissues and spinal canal: Prevertebral soft tissues are normal. No evidence of canal stenosis. Disc levels: Disc space narrowing at the C6-7 level and to lesser extent at the C5-6 level. Upper chest: No acute findings. Other: None. IMPRESSION: 1. No acute brain injury. 2. Chronic ischemic microvascular disease and age related atrophic change. Old left cerebellar infarct. 3. No acute cervical spine injury. 4. Moderate spondylosis of the cervical spine with multilevel neural foraminal narrowing and disc disease as described. Electronically Signed   By: Marin Olp M.D.   On: 03/27/2022 14:01   CT Head Wo Contrast  Result Date: 03/27/2022 CLINICAL DATA:  Fall this morning from toilet.  Dizziness. EXAM: CT HEAD WITHOUT CONTRAST CT CERVICAL SPINE WITHOUT CONTRAST TECHNIQUE: Multidetector CT imaging of the head and cervical spine was performed following the standard protocol without intravenous contrast. Multiplanar CT image reconstructions of the cervical spine were also generated. RADIATION DOSE REDUCTION: This exam was performed according to the departmental dose-optimization program which includes automated exposure control, adjustment of the mA and/or kV according to patient size and/or use of iterative reconstruction technique. COMPARISON:  11/29/2020 and head CT 07/01/2021 FINDINGS: CT HEAD  FINDINGS Brain: Exam demonstrates mild stable prominence of the ventricles compatible with age related atrophic change. CSF spaces and cisterns are within normal. There is chronic ischemic microvascular disease present. Old left cerebellar infarct. No mass, mass effect, shift of midline structures or acute hemorrhage. No evidence of acute infarction. Vascular: No hyperdense vessel or unexpected calcification. Skull: Normal. Negative for fracture or focal lesion. Sinuses/Orbits: No acute finding. Other: None. CT CERVICAL SPINE FINDINGS Alignment: Normal. Skull base and vertebrae: Vertebral body heights are normal. There is moderate spondylosis of the cervical spine to include uncovertebral joint spurring and facet arthropathy. Degenerative change of the atlantoaxial articulation. Mild bilateral neural foraminal narrowing at the C2-3 level. Moderate bilateral neural foraminal narrowing at the C3-4 level, mild bilateral neural foraminal narrowing at the C4-5 level, moderate right-sided neural foraminal narrowing at the C5-6 level, moderate bilateral neural foraminal narrowing at the C6-7 level. No acute fracture. Soft tissues and spinal canal: Prevertebral soft tissues are normal. No evidence of canal stenosis. Disc levels: Disc space narrowing at the C6-7 level and to lesser extent at the C5-6 level.  Upper chest: No acute findings. Other: None. IMPRESSION: 1. No acute brain injury. 2. Chronic ischemic microvascular disease and age related atrophic change. Old left cerebellar infarct. 3. No acute cervical spine injury. 4. Moderate spondylosis of the cervical spine with multilevel neural foraminal narrowing and disc disease as described. Electronically Signed   By: Marin Olp M.D.   On: 03/27/2022 14:01    PROCEDURES and INTERVENTIONS:  Procedures  Medications - No data to display   IMPRESSION / MDM / [place] / ED COURSE  I reviewed the triage vital signs and the nursing notes.  Differential  diagnosis includes, but is not limited to, ICH, skull fracture, scalp laceration, stroke, syncope, cardiogenic syncope, seizure  {Patient presents with symptoms of an acute illness or injury that is potentially life-threatening.  Pleasant 88 year old male presents to the ED after fall and head trauma without evidence of significant acute pathology and suitable for outpatient management.  He looks well.  Small abrasion to the scalp and no active bleeding or indications for repair.  No neurologic or vascular deficits.  Blood work is benign with normal CBC and metabolic panel.  Negative troponin.  EKG is reassuring without ischemic features or interval changes to suggest cardiogenic syncope.  CT head and neck are without acute features and he looks well and requesting discharge.  I considered further observation for this patient but ultimately we decided outpatient management      FINAL CLINICAL IMPRESSION(S) / ED DIAGNOSES   Final diagnoses:  Fall, initial encounter  Minor head injury, initial encounter     Rx / DC Orders   ED Discharge Orders     None        Note:  This document was prepared using Dragon voice recognition software and may include unintentional dictation errors.   Vladimir Crofts, MD 03/27/22 [phone]

## 2022-03-27 NOTE — ED Notes (Signed)
Patient is being discharged from the Urgent Care and sent to the [facility] Emergency Department via private vehicle with his wife . Per Margarette Canada, NP, patient is in need of higher level of care due to hypotension and head injury. Patient and wife are aware and verbalizes understanding of plan of care.  Vitals:   03/27/22 0913 03/27/22 0935  BP: (!) 96/58 132/64  Pulse:    Resp:    Temp:    SpO2:

## 2022-03-27 NOTE — ED Triage Notes (Signed)
Patient states that he got dizzy and fell and hit his head on a chest this morning.  Patient takes a baby aspirin daily. Patient denies any pain.  Patient has laceration to his head.

## 2022-03-27 NOTE — ED Provider Notes (Signed)
[facility]    CSN: [id number] Arrival date & time: 03/27/22  0849      History   Chief Complaint Chief Complaint  Patient presents with   Fall   Head Injury    HPI Eric Torres is a 88 y.o. male.   HPI  88 year old male here for evaluation of syncope.  Patient is here with his wife who reports that he was in the bathroom sitting on a stool and after he got up in the stool and started to navigate his way while holding onto the vanity he became dizzy, off balance, and fell striking a cabinet with his head.  He did not have a loss of consciousness and he is able to remember the entire event.  He also denies any chest pain, shortness breath, nausea, or sweating.  He states the dizziness was more feeling of being off balance.  He does have a history of high blood pressure he takes propranolol.  He also is a history of Parkinson's disease, previous CVA, history of hypotension, history of falls, history of BPH, and history of nonsustained ventricular tachycardia.  He denies any palpitations at this time.  He is able to give me his full history and he is alert and oriented x3.  Past Medical History:  Diagnosis Date   Bilateral foot pain    LIKELY NEUROPATHY   BPH (benign prostatic hyperplasia)    Cancer (HCC)    ,HX OF PROSTATE CANCER AND BLADDER CANCER   Collagenous colitis    COPD (chronic obstructive pulmonary disease) (HCC)    MILD-NO INHALERS PER PT   Essential tremor    Frequent falls    Heart murmur    History of brachytherapy    History of TIA (transient ischemic attack) 2015   LEFT THUMB AND INDEX FINGER-SOMETIMES I DROP THINGS   Hypercholesteremia    Hypertension    Hypothyroidism    PVD (peripheral vascular disease) ([place])    Stroke ([place])    Vitamin D deficiency     Patient Active Problem List   Diagnosis Date Noted   COVID-19 virus infection 07/01/2021   Fall at home, initial encounter 07/01/2021   BPH (benign prostatic hyperplasia)    COPD  (chronic obstructive pulmonary disease) (HCC)    Essential tremor    Hypothyroidism    HLD (hyperlipidemia)    Hyponatremia    Elevated troponin    NSVT (nonsustained ventricular tachycardia) ([place])    Dysphagia 06/07/2020   Parkinson's disease ([place]) 06/07/2020   Aspiration pneumonia ([place]) 06/06/2020   Acute respiratory failure with hypoxia ([place]) 06/06/2020   HTN (hypertension) 06/06/2020   History of CVA (cerebrovascular accident) 06/06/2020   Hypotension 06/06/2020   Tremors of nervous system 08/29/2019   CAP (community acquired pneumonia) 08/28/2019   Weakness generalized 08/28/2019   Tremor 08/28/2019    Past Surgical History:  Procedure Laterality Date   CHOLECYSTECTOMY     CYSTOSCOPY WITH BIOPSY N/A 12/29/2015   Procedure: CYSTOSCOPY WITH BIOPSY;  Surgeon: Royston Cowper, MD;  Location: [facility] ORS;  Service: Urology;  Laterality: N/A;   DIAGNOSTIC LAPAROSCOPY     ESOPHAGOGASTRODUODENOSCOPY (EGD) WITH PROPOFOL N/A 02/15/2019   Procedure: ESOPHAGOGASTRODUODENOSCOPY (EGD) WITH PROPOFOL;  Surgeon: Lollie Sails, MD;  Location: [facility] ENDOSCOPY;  Service: Endoscopy;  Laterality: N/A;   EXPLORATORY LAPAROTOMY  1991   VOLVULUS AND BOWEL OBSTRUCTION   HERNIA REPAIR     ILIAC ARTERY STENT Bilateral    TONSILLECTOMY     TRANSURETHRAL  RESECTION OF BLADDER TUMOR N/A 12/29/2015   Procedure: TRANSURETHRAL RESECTION OF BLADDER TUMOR (TURBT)/MITOMYCIN INSTILLATION;  Surgeon: Royston Cowper, MD;  Location: [facility] ORS;  Service: Urology;  Laterality: N/A;   TRANSURETHRAL RESECTION OF BLADDER TUMOR WITH GYRUS (TURBT-GYRUS)  2013   VASECTOMY         Home Medications    Prior to Admission medications   Medication Sig Start Date End Date Taking? Authorizing Provider  aspirin 81 MG tablet Take 81 mg by mouth daily.   Yes [provider]  atorvastatin (LIPITOR) 80 MG tablet Take 80 mg by mouth daily at 6 PM.   Yes [provider]  finasteride (PROSCAR) 5 MG tablet Take 5  mg by mouth at bedtime.   Yes [provider]  levothyroxine (SYNTHROID, LEVOTHROID) 112 MCG tablet Take 112 mcg by mouth at bedtime.    Yes [provider]  Loperamide HCl 1 MG/7.5ML LIQD Take 7.5 mLs (1 mg total) by mouth every 4 (four) hours as needed. 03/14/22  Yes Lucilla Lame, MD  pantoprazole (PROTONIX) 40 MG tablet Take 1 tablet (40 mg total) by mouth daily. MUST SCHEDULE OFFICE VISIT 03/14/22  Yes Lucilla Lame, MD  primidone (MYSOLINE) 50 MG tablet Take by mouth. 02/14/22 02/14/23 Yes [provider]  propranolol (INDERAL) 20 MG tablet Take 20 mg by mouth 2 (two) times daily.    Yes [provider]  albuterol (VENTOLIN HFA) 108 (90 Base) MCG/ACT inhaler Inhale 1-2 puffs into the lungs every 6 (six) hours as needed for wheezing or shortness of breath. 03/30/21   Coral Spikes, DO  Nutritional Supplements (BLADDER 2.2) TABS Take 1 tablet by mouth 2 (two) times daily.     [provider]    Family History Family History  Family history unknown: Yes    Social History Social History   Tobacco Use   Smoking status: Former    Packs/day: 0.50    Years: 35.00    Total pack years: 17.50    Types: Cigarettes    Quit date: 12/23/2011    Years since quitting: 10.2   Smokeless tobacco: Never  Vaping Use   Vaping Use: Never used  Substance Use Topics   Alcohol use: Yes    Comment: OCC   Drug use: No     Allergies   Patient has no known allergies.   Review of Systems Review of Systems  Constitutional:  Negative for diaphoresis and fever.  Eyes:  Negative for visual disturbance.  Respiratory:  Negative for shortness of breath.   Cardiovascular:  Negative for chest pain.  Skin:  Positive for wound.  Neurological:  Positive for dizziness, tremors and syncope. Negative for headaches.  Hematological: Negative.   Psychiatric/Behavioral: Negative.       Physical Exam Triage Vital Signs ED Triage Vitals  Enc Vitals Group     BP 03/27/22  0911 (!) 85/51     Pulse Rate 03/27/22 0911 86     Resp 03/27/22 0911 15     Temp 03/27/22 0911 97.7 F (36.5 C)     Temp Source 03/27/22 0911 Oral     SpO2 03/27/22 0911 94 %     Weight 03/27/22 0909 173 lb (78.5 kg)     Height 03/27/22 0909 '5\' 7"'$  (1.702 m)     Head Circumference --      Peak Flow --      Pain Score 03/27/22 0908 0     Pain Loc --  Pain Edu? --      Excl. in [place]? --    No data found.  Updated Vital Signs BP 132/64 (BP Location: Left Arm)   Pulse 86   Temp 97.7 F (36.5 C) (Oral)   Resp 15   Ht '5\' 7"'$  (1.702 m)   Wt 173 lb (78.5 kg)   SpO2 94%   BMI 27.10 kg/m   Visual Acuity Right Eye Distance:   Left Eye Distance:   Bilateral Distance:    Right Eye Near:   Left Eye Near:    Bilateral Near:     Physical Exam Vitals and nursing note reviewed.  Constitutional:      Appearance: Normal appearance. He is not ill-appearing.  HENT:     Head: Normocephalic.     Comments: Patient has a small, pencil eraser sized superficial wound to the crown of the skull that is scabbed.  No lacerations or abrasions noted.    Mouth/Throat:     Mouth: Mucous membranes are dry.     Pharynx: Oropharynx is clear. No oropharyngeal exudate or posterior oropharyngeal erythema.  Eyes:     General: No scleral icterus.    Extraocular Movements: Extraocular movements intact.     Conjunctiva/sclera: Conjunctivae normal.     Pupils: Pupils are equal, round, and reactive to light.  Cardiovascular:     Rate and Rhythm: Normal rate and regular rhythm.     Pulses: Normal pulses.     Heart sounds: Normal heart sounds. No murmur heard.    No friction rub. No gallop.  Pulmonary:     Effort: Pulmonary effort is normal.     Breath sounds: Normal breath sounds. No wheezing, rhonchi or rales.  Skin:    General: Skin is warm and dry.     Capillary Refill: Capillary refill takes less than 2 seconds.     Coloration: Skin is not pale.     Findings: No erythema.  Neurological:      General: No focal deficit present.     Mental Status: He is alert and oriented to person, place, and time.  Psychiatric:        Mood and Affect: Mood normal.        Behavior: Behavior normal.        Thought Content: Thought content normal.        Judgment: Judgment normal.      UC Treatments / Results  Labs (all labs ordered are listed, but only abnormal results are displayed) Labs Reviewed - No data to display  EKG Normal sinus rhythm with a ventricular rate 84 bpm PR 164 ms QRS duration 80 ms QT/QTc 376/444 ms No ST or T wave abnormalities.   Radiology No results found.  Procedures Procedures (including critical care time)  Medications Ordered in UC Medications - No data to display  Initial Impression / Assessment and Plan / UC Course  I have reviewed the triage vital signs and the nursing notes.  Pertinent labs & imaging results that were available during my care of the patient were reviewed by me and considered in my medical decision making (see chart for details).  Nontoxic-appearing 88 year old male here for evaluation of head injury following a syncopal episode and fall at home.  He does have a history of Parkinson's and he is supposed to be using a walker but he was not at this time.  He reports that he was in the bathroom sitting on a stool and when he stood up and  started to exit the bathroom he became off balance, dizzy, and fell striking It with his head.  He did not have a loss of consciousness.  He has not had any nausea or vomiting and he denies chest pain, sweating, or shortness of breath.  On exam patient does have a pencil eraser size superficial abrasion on the crown of his skull that is scabbed.  There are some dried blood in the surrounding tissue but no visible lacerations or other abrasions noted.  Patient's pupils are equal round reactive and his EOM is intact.  Cranial nerves II through XII are intact.  Patient is moving all extremities.  He does have  difficulty with transfers secondary to his Parkinson's disease.  Cardiopulmonary exam reveals S1-S2 heart sounds with regular rate and rhythm and lung sounds are clear to auscultation in all fields.  Patient's initial blood pressure was 85/51 followed by 96/58 in the opposite arm.  His EKG shows normal sinus rhythm without any T wave or ST abnormalities.  Recheck of his blood pressure shows a BP of 132/64.  I have talked the patient about his water consumption given his sticky sclera and or mucous membranes.  He states that he does not get eight 8 ounce glasses of water daily.  Given his transient hypotension and his head injury I do feel that he should be evaluated in the emergency department and have a head CT given his age.  I believe the transient hypotension is secondary to not consuming enough water in conjunction with his propranolol.  I do believe patient is safe to travel via POV given that his blood pressure has returned to normal.  I do believe patient is safe to travel via POV and he and his wife have elected to go to [facility].   Final Clinical Impressions(s) / UC Diagnoses   Final diagnoses:  Injury of head, initial encounter  Fall, initial encounter  Dehydration     Discharge Instructions      Please go to the emergency department [facility] for further evaluation following a fall and head injury.  As I explained there is no laceration that needs repair but given your age and that you are on baby aspirin you do need to head CT to make sure that you are not experiencing any intracranial processes that have not yet presented themselves.  Please go now.     ED Prescriptions   None    PDMP not reviewed this encounter.   Margarette Canada, NP 03/27/22 [phone]

## 2022-03-27 NOTE — Discharge Instructions (Addendum)
Please go to the emergency department [facility] for further evaluation following a fall and head injury.  As I explained there is no laceration that needs repair but given your age and that you are on baby aspirin you do need to head CT to make sure that you are not experiencing any intracranial processes that have not yet presented themselves.  Please go now.

## 2022-03-28 ENCOUNTER — Encounter: Payer: Self-pay | Admitting: Emergency Medicine

## 2022-03-31 ENCOUNTER — Encounter: Payer: Self-pay | Admitting: Emergency Medicine

## 2022-03-31 ENCOUNTER — Emergency Department
Admission: EM | Admit: 2022-03-31 | Discharge: 2022-04-01 | Disposition: A | Discharge status: Home / Self Care | Payer: Medicare Other | Attending: Emergency Medicine | Admitting: Emergency Medicine

## 2022-03-31 DIAGNOSIS — R350 Frequency of micturition: Secondary | ICD-10-CM | POA: Insufficient documentation

## 2022-03-31 DIAGNOSIS — R296 Repeated falls: Secondary | ICD-10-CM | POA: Diagnosis not present

## 2022-03-31 DIAGNOSIS — R404 Transient alteration of awareness: Secondary | ICD-10-CM | POA: Diagnosis not present

## 2022-03-31 NOTE — ED Triage Notes (Addendum)
Pt presents via EMS - recently dx with UTI and has been on bactrim for two days. Per family, the antibiotics "aren't working fast enough" so was sent here. Pt also has a unrelated fall today after tripping over some furniture- has skin tears to his right arm/ both old and new. Denies LOC or hitting his head.     Pt has no complaints at this time.

## 2022-04-01 LAB — BASIC METABOLIC PANEL
Anion gap: 6 (ref 5–15)
BUN: 21 mg/dL (ref 8–23)
CO2: 27 mmol/L (ref 22–32)
Calcium: 9.1 mg/dL (ref 8.9–10.3)
Chloride: 103 mmol/L (ref 98–111)
Creatinine, Ser: 1.16 mg/dL (ref 0.61–1.24)
GFR, Estimated: >60 mL/min (ref >60)
Glucose, Bld: 101 mg/dL — ABNORMAL HIGH (ref 70–99)
Potassium: 4.2 mmol/L (ref 3.5–5.1)
Sodium: 136 mmol/L (ref 135–145)

## 2022-04-01 LAB — CBC WITH DIFFERENTIAL/PLATELET
Abs Immature Granulocytes: 0.05 10*3/uL (ref 0.00–0.07)
Basophils Absolute: 0.1 10*3/uL (ref 0.0–0.1)
Basophils Relative: 1 %
Eosinophils Absolute: 0.2 10*3/uL (ref 0.0–0.5)
Eosinophils Relative: 2 %
HCT: 41.3 % (ref 39.0–52.0)
Hemoglobin: 14.3 g/dL (ref 13.0–17.0)
Immature Granulocytes: 1 %
Lymphocytes Relative: 18 %
Lymphs Abs: 1.8 10*3/uL (ref 0.7–4.0)
MCH: 32.9 pg (ref 26.0–34.0)
MCHC: 34.6 g/dL (ref 30.0–36.0)
MCV: 94.9 fL (ref 80.0–100.0)
Monocytes Absolute: 1.3 10*3/uL — ABNORMAL HIGH (ref 0.1–1.0)
Monocytes Relative: 13 %
Neutro Abs: 6.5 10*3/uL (ref 1.7–7.7)
Neutrophils Relative %: 65 %
Platelets: 200 10*3/uL (ref 150–400)
RBC: 4.35 MIL/uL (ref 4.22–5.81)
RDW: 12.7 % (ref 11.5–15.5)
WBC: 9.8 10*3/uL (ref 4.0–10.5)
nRBC: 0 % (ref 0.0–0.2)

## 2022-04-01 LAB — URINALYSIS, COMPLETE (UACMP) WITH MICROSCOPIC
Bacteria, UA: NONE SEEN
Bilirubin Urine: NEGATIVE
Glucose, UA: NEGATIVE mg/dL
Hgb urine dipstick: NEGATIVE
Ketones, ur: 5 mg/dL — AB
Leukocytes,Ua: NEGATIVE
Nitrite: NEGATIVE
Protein, ur: NEGATIVE mg/dL
Specific Gravity, Urine: 1.012 (ref 1.005–1.030)
pH: 6 (ref 5.0–8.0)

## 2022-04-01 MED ORDER — SODIUM CHLORIDE 0.9 % IV BOLUS
500.0000 mL | Freq: Once | INTRAVENOUS | Status: AC
Start: 2022-04-01 — End: 2022-04-01
  Administered 2022-04-01: 500 mL via INTRAVENOUS

## 2022-04-02 LAB — URINE CULTURE: Culture: <10000 — AB

## 2022-04-18 ENCOUNTER — Ambulatory Visit
Admission: EM | Admit: 2022-04-18 | Discharge: 2022-04-18 | Disposition: A | Discharge status: Home / Self Care | Payer: Medicare Other | Attending: Emergency Medicine | Admitting: Emergency Medicine

## 2022-04-18 DIAGNOSIS — R8271 Bacteriuria: Secondary | ICD-10-CM | POA: Insufficient documentation

## 2022-04-18 DIAGNOSIS — G20 Parkinson's disease: Secondary | ICD-10-CM | POA: Diagnosis not present

## 2022-04-18 LAB — URINALYSIS, MICROSCOPIC (REFLEX)

## 2022-04-18 LAB — URINALYSIS, ROUTINE W REFLEX MICROSCOPIC
Bilirubin Urine: NEGATIVE
Glucose, UA: NEGATIVE mg/dL
Hgb urine dipstick: NEGATIVE
Ketones, ur: NEGATIVE mg/dL
Nitrite: NEGATIVE
Protein, ur: NEGATIVE mg/dL
Specific Gravity, Urine: 1.01 (ref 1.005–1.030)
pH: 6 (ref 5.0–8.0)

## 2022-04-18 MED ORDER — CEFDINIR 300 MG PO CAPS: 300.0000 mg | ORAL_CAPSULE | Freq: Two times a day (BID) | ORAL | 0 refills | Status: DC

## 2022-04-18 NOTE — ED Triage Notes (Signed)
Patient presents to [facility] for UTI symptoms.   Patient states there is pain with urination.

## 2022-04-18 NOTE — Discharge Instructions (Addendum)
-  Omnicef: 1 tablet twice a day for 10 days -Follow-up with urologist and neurology: -Could have pharmacist evaluate medications to look for interactions that could be because of waxing and waning of symptoms

## 2022-04-18 NOTE — ED Provider Notes (Addendum)
[facility]    CSN: [id number] Arrival date & time: 04/18/22  6440      History   Chief Complaint Chief Complaint  Patient presents with   urinary problem    HPI Eric Torres is a 88 y.o. male.   Patient is a 88 year old male with a history of BPH, COPD, essential tremor, hypothyroidism, Parkinson's, generalized weakness, and bladder cancer treated with radioactive seeds who presents with complaint of recurrent UTI.  Patient states his symptoms are atypical and typically involves feeling weak, dizzy and unstable.  Per patient daughter, after he completes a course of antibiotics and typically feels better, active, walking without his walker but that usually diminishes as they are counting towards another UTI.  Patient is followed by Dr. Eliberto Ivory with urology.  Patient is currently on rivastigmine as well as primidone.  Patient primidone dose is 50 mg, had been 250 mg twice daily and are now coming down with the addition of the rivastigmine.    Past Medical History:  Diagnosis Date   Bilateral foot pain    LIKELY NEUROPATHY   BPH (benign prostatic hyperplasia)    Cancer (HCC)    ,HX OF PROSTATE CANCER AND BLADDER CANCER   Cancer (HCC)    Collagenous colitis    COPD (chronic obstructive pulmonary disease) (HCC)    MILD-NO INHALERS PER PT   Essential tremor    Frequent falls    Heart murmur    History of brachytherapy    History of TIA (transient ischemic attack) 2015   LEFT THUMB AND INDEX FINGER-SOMETIMES I DROP THINGS   Hypercholesteremia    Hypertension    Hypothyroidism    PVD (peripheral vascular disease) ([place])    Raynaud disease    Stroke ([place])    Vertigo    Vitamin D deficiency     Patient Active Problem List   Diagnosis Date Noted   COVID-19 virus infection 07/01/2021   Fall at home, initial encounter 07/01/2021   BPH (benign prostatic hyperplasia)    COPD (chronic obstructive pulmonary disease) (HCC)    Essential tremor    Hypothyroidism     HLD (hyperlipidemia)    Hyponatremia    Elevated troponin    NSVT (nonsustained ventricular tachycardia) ([place])    Dysphagia 06/07/2020   Parkinson's disease ([place]) 06/07/2020   Aspiration pneumonia ([place]) 06/06/2020   Acute respiratory failure with hypoxia ([place]) 06/06/2020   HTN (hypertension) 06/06/2020   History of CVA (cerebrovascular accident) 06/06/2020   Hypotension 06/06/2020   Tremors of nervous system 08/29/2019   CAP (community acquired pneumonia) 08/28/2019   Weakness generalized 08/28/2019   Tremor 08/28/2019    Past Surgical History:  Procedure Laterality Date   CHOLECYSTECTOMY     CYSTOSCOPY WITH BIOPSY N/A 12/29/2015   Procedure: CYSTOSCOPY WITH BIOPSY;  Surgeon: Royston Cowper, MD;  Location: [facility] ORS;  Service: Urology;  Laterality: N/A;   DIAGNOSTIC LAPAROSCOPY     ESOPHAGOGASTRODUODENOSCOPY (EGD) WITH PROPOFOL N/A 02/15/2019   Procedure: ESOPHAGOGASTRODUODENOSCOPY (EGD) WITH PROPOFOL;  Surgeon: Lollie Sails, MD;  Location: [facility] ENDOSCOPY;  Service: Endoscopy;  Laterality: N/A;   EXPLORATORY LAPAROTOMY  1991   VOLVULUS AND BOWEL OBSTRUCTION   HERNIA REPAIR     ILIAC ARTERY STENT Bilateral    STENT PLACEMENT ILIAC ([place] HX) Bilateral 2010   TONSILLECTOMY     TRANSURETHRAL RESECTION OF BLADDER TUMOR N/A 12/29/2015   Procedure: TRANSURETHRAL RESECTION OF BLADDER TUMOR (TURBT)/MITOMYCIN INSTILLATION;  Surgeon: Royston Cowper, MD;  Location:  [facility] ORS;  Service: Urology;  Laterality: N/A;   TRANSURETHRAL RESECTION OF BLADDER TUMOR WITH GYRUS (TURBT-GYRUS)  2013   VASECTOMY         Home Medications    Prior to Admission medications   Medication Sig Start Date End Date Taking? Authorizing Provider  albuterol (VENTOLIN HFA) 108 (90 Base) MCG/ACT inhaler Inhale 1-2 puffs into the lungs every 6 (six) hours as needed for wheezing or shortness of breath. 03/30/21  Yes Thersa Salt G, DO  aspirin 81 MG tablet Take 81 mg by mouth daily.   Yes [provider]   atorvastatin (LIPITOR) 80 MG tablet Take 80 mg by mouth daily at 6 PM.   Yes [provider]  cefdinir (OMNICEF) 300 MG capsule Take 1 capsule (300 mg total) by mouth 2 (two) times daily for 10 days. 04/18/22 04/28/22 Yes Luvenia Redden, PA-C  finasteride (PROSCAR) 5 MG tablet Take 5 mg by mouth at bedtime.   Yes [provider]  levothyroxine (SYNTHROID, LEVOTHROID) 112 MCG tablet Take 112 mcg by mouth at bedtime.    Yes [provider]  Loperamide HCl 1 MG/7.5ML LIQD Take 7.5 mLs (1 mg total) by mouth every 4 (four) hours as needed. 03/14/22  Yes Lucilla Lame, MD  Nutritional Supplements (BLADDER 2.2) TABS Take 1 tablet by mouth 2 (two) times daily.    Yes [provider]  pantoprazole (PROTONIX) 40 MG tablet Take 1 tablet (40 mg total) by mouth daily. MUST SCHEDULE OFFICE VISIT 03/14/22  Yes Lucilla Lame, MD  primidone (MYSOLINE) 50 MG tablet Take by mouth. 02/14/22 02/14/23 Yes [provider]  propranolol (INDERAL) 20 MG tablet Take 20 mg by mouth 2 (two) times daily.    Yes [provider]    Family History Family History  Family history unknown: Yes    Social History Social History   Tobacco Use   Smoking status: Former    Packs/day: 0.50    Years: 35.00    Total pack years: 17.50    Types: Cigarettes    Quit date: 12/23/2011    Years since quitting: 10.3   Smokeless tobacco: Never  Vaping Use   Vaping Use: Never used  Substance Use Topics   Alcohol use: Yes    Comment: OCC   Drug use: No     Allergies   Patient has no known allergies.   Review of Systems Review of Systems as noted above in HPI, other systems reviewed and found to be negative.   Physical Exam Triage Vital Signs ED Triage Vitals  Enc Vitals Group     BP 04/18/22 0930 (!) 134/58     Pulse Rate 04/18/22 0930 65     Resp --      Temp 04/18/22 0930 97.8 F (36.6 C)     Temp Source 04/18/22 0930 Oral     SpO2 04/18/22 0930 99 %     Weight  04/18/22 0927 167 lb (75.8 kg)     Height 04/18/22 0927 '5\' 8"'$  (3.976 m)     Head Circumference --      Peak Flow --      Pain Score 04/18/22 0927 0     Pain Loc --      Pain Edu? --      Excl. in [place]? --    No data found.  Updated Vital Signs BP (!) 134/58 (BP Location: Left Arm)   Pulse 65   Temp 97.8 F (36.6 C) (Oral)  Ht '5\' 8"'$  (1.727 m)   Wt 167 lb (75.8 kg)   SpO2 99%   BMI 25.39 kg/m    Physical Exam Constitutional:      Appearance: Normal appearance.     Comments: Sitting in wheelchair   Pulmonary:     Effort: Pulmonary effort is normal. No respiratory distress.     Breath sounds: Normal breath sounds. No stridor. No wheezing.  Abdominal:     General: Abdomen is flat.     Tenderness: There is no right CVA tenderness or left CVA tenderness.  Neurological:     Comments: Baseline dementia      UC Treatments / Results  Labs (all labs ordered are listed, but only abnormal results are displayed) Labs Reviewed  URINALYSIS, ROUTINE W REFLEX MICROSCOPIC - Abnormal; Notable for the following components:      Result Value   Leukocytes,Ua TRACE (*)    All other components within normal limits  URINALYSIS, MICROSCOPIC (REFLEX) - Abnormal; Notable for the following components:   Bacteria, UA FEW (*)    All other components within normal limits  URINE CULTURE    EKG   Radiology No results found.  Procedures Procedures (including critical care time)  Medications Ordered in UC Medications - No data to display  Initial Impression / Assessment and Plan / UC Course  I have reviewed the triage vital signs and the nursing notes.  Pertinent labs & imaging results that were available during my care of the patient were reviewed by me and considered in my medical decision making (see chart for details).    Patient returns with concern for recurrent UTI.  Patient reports feeling weak, unstable, and being dizzy.  Per patient and family, the symptoms improve after he  completes taking a course of antibiotics for his recurrent UTIs.  Patient is on medications for Parkinson's and dementia, question if these are related.  Quick check of interactions showed no overt issues with these medication interactions except the concern for rivastigmine toxicity with her primidone the patient is being weaned down off the primidone.  Also primidone could lessen the usefulness of other medications due to interaction with its pharmacology.  Patient follow-up with urology and neurology and also recommend them asking the pharmacist today to look at with her medications to see if there could be some interaction with the antibiotics and altering how the Parkinson medications are working.  Final Clinical Impressions(s) / UC Diagnoses   Final diagnoses:  Bacteria in urine  Parkinson's disease ([place])     Discharge Instructions      -Omnicef: 1 tablet twice a day for 10 days -Follow-up with urologist and neurology: -Could have pharmacist evaluate medications to look for interactions that could be because of waxing and waning of symptoms     ED Prescriptions     Medication Sig Dispense Auth. Provider   cefdinir (OMNICEF) 300 MG capsule Take 1 capsule (300 mg total) by mouth 2 (two) times daily for 10 days. 20 capsule Luvenia Redden, PA-C      PDMP not reviewed this encounter.   Luvenia Redden, PA-C 04/18/22 1009    Luvenia Redden, PA-C 04/18/22 1012

## 2022-04-20 LAB — URINE CULTURE: Culture: 60000 — AB

## 2022-04-21 ENCOUNTER — Telehealth (HOSPITAL_COMMUNITY): Payer: Self-pay | Admitting: Emergency Medicine

## 2022-04-21 MED ORDER — AMOXICILLIN 500 MG PO CAPS: 500.0000 mg | ORAL_CAPSULE | Freq: Three times a day (TID) | ORAL | 0 refills | Status: AC

## 2022-05-16 ENCOUNTER — Telehealth: Payer: Self-pay

## 2022-05-16 ENCOUNTER — Telehealth: Payer: Self-pay | Admitting: Gastroenterology

## 2022-05-16 NOTE — Telephone Encounter (Signed)
error 

## 2022-05-16 NOTE — Telephone Encounter (Signed)
Patient spouse is wondering if he can be seen any earlier. She is asking if Dr Allen Norris cold look at his chart and see if he can fit the patient in for a sooner appt. Requesting a call back.

## 2022-05-19 NOTE — Telephone Encounter (Signed)
Appt moved to 9/11 [place]--pt is aware of change

## 2022-06-20 ENCOUNTER — Ambulatory Visit (INDEPENDENT_AMBULATORY_CARE_PROVIDER_SITE_OTHER): Payer: Medicare Other | Admitting: Gastroenterology

## 2022-06-20 ENCOUNTER — Encounter: Payer: Self-pay | Admitting: Gastroenterology

## 2022-06-20 VITALS — BP 154/83 | HR 60 | Temp 97.7°F | Wt 160.0 lb

## 2022-06-20 DIAGNOSIS — R1312 Dysphagia, oropharyngeal phase: Secondary | ICD-10-CM | POA: Diagnosis not present

## 2022-06-20 MED ORDER — BUDESONIDE 3 MG PO CPEP: 9.0000 mg | ORAL_CAPSULE | Freq: Every morning | ORAL | 5 refills | Status: DC

## 2022-06-20 NOTE — Progress Notes (Signed)
Primary Care Physician: Kirk Ruths, MD  Primary Gastroenterologist:  Dr. Lucilla Lame  Chief Complaint  Patient presents with   Dysphagia    HPI: Eric Torres is a 88 y.o. male here with a history of seeing me for dysphagia in the past and for diarrhea.  The patient had a barium swallow back in 2020 and had previously been followed by Dr. Gustavo Lah and his nurse practitioner.  The barium swallow in 2020 showed:  IMPRESSION: 1. The initial oropharyngeal swallows are normal, however following multiple swallows of thick barium, small volume silent aspiration is noted and no further barium was administered.   2. Severe, persistent esophageal dysmotility noted with all swallows. The gastroesophageal sphincter is not clearly observed to relax, however a swallowed barium tablet passes readily into the stomach. Lower esophageal mass is not excluded. Consider endoscopic evaluation if not already performed.  A month after this barium swallow the patient had an upper endoscopy by Dr. Gustavo Lah that did not show any narrowing or obstruction.  The patient's biopsies of his colon showed him to have collagenous colitis.  The patient has been taking Imodium twice a day with very little relief.  The patient has never been tried on budesonide.  He also reports that his swallowing is somewhat worse/different than it was in the past.  He used to cough a lot when he ate but now he feels like it sticking in his chest.  Past Medical History:  Diagnosis Date   Bilateral foot pain    LIKELY NEUROPATHY   BPH (benign prostatic hyperplasia)    Cancer (HCC)    ,HX OF PROSTATE CANCER AND BLADDER CANCER   Cancer ([place])    Collagenous colitis    COPD (chronic obstructive pulmonary disease) ([place])    MILD-NO INHALERS PER PT   Essential tremor    Frequent falls    Heart murmur    History of brachytherapy    History of TIA (transient ischemic attack) 2015   LEFT THUMB AND INDEX FINGER-SOMETIMES  I DROP THINGS   Hypercholesteremia    Hypertension    Hypothyroidism    PVD (peripheral vascular disease) ([place])    Raynaud disease    Stroke ([place])    Vertigo    Vitamin D deficiency     Current Outpatient Medications  Medication Sig Dispense Refill   albuterol (VENTOLIN HFA) 108 (90 Base) MCG/ACT inhaler Inhale 1-2 puffs into the lungs every 6 (six) hours as needed for wheezing or shortness of breath. 18 g 0   aspirin 81 MG tablet Take 81 mg by mouth daily.     atorvastatin (LIPITOR) 80 MG tablet Take 80 mg by mouth daily at 6 PM.     finasteride (PROSCAR) 5 MG tablet Take 5 mg by mouth at bedtime.     levothyroxine (SYNTHROID, LEVOTHROID) 112 MCG tablet Take 112 mcg by mouth at bedtime.      Loperamide HCl 1 MG/7.5ML LIQD Take 7.5 mLs (1 mg total) by mouth every 4 (four) hours as needed. 236 mL 0   Nutritional Supplements (BLADDER 2.2) TABS Take 1 tablet by mouth 2 (two) times daily.      pantoprazole (PROTONIX) 40 MG tablet Take 1 tablet (40 mg total) by mouth daily. MUST SCHEDULE OFFICE VISIT 90 tablet 3   primidone (MYSOLINE) 50 MG tablet Take by mouth.     propranolol (INDERAL) 20 MG tablet Take 20 mg by mouth 2 (two) times daily.  No current facility-administered medications for this visit.    Allergies as of 06/20/2022   (No Known Allergies)    ROS:  General: Negative for anorexia, weight loss, fever, chills, fatigue, weakness. ENT: Negative for hoarseness, difficulty swallowing , nasal congestion. CV: Negative for chest pain, angina, palpitations, dyspnea on exertion, peripheral edema.  Respiratory: Negative for dyspnea at rest, dyspnea on exertion, cough, sputum, wheezing.  GI: See history of present illness. GU:  Negative for dysuria, hematuria, urinary incontinence, urinary frequency, nocturnal urination.  Endo: Negative for unusual weight change.    Physical Examination:   BP (!) 154/83   Pulse 60   Temp 97.7 F (36.5 C) (Oral)   Wt 160 lb (72.6 kg)    BMI 24.33 kg/m   General: Well-nourished, well-developed in no acute distress.  Eyes: No icterus. Conjunctivae pink. Lungs: Clear to auscultation bilaterally. Non-labored. Heart: Regular rate and rhythm, no murmurs rubs or gallops.  Abdomen: Bowel sounds are normal, nontender, nondistended, no hepatosplenomegaly or masses, no abdominal bruits or hernia , no rebound or guarding.   Extremities: No lower extremity edema. No clubbing or deformities. Neuro: Alert and oriented x 3.  Grossly intact. Skin: Warm and dry, no jaundice.   Psych: Alert and cooperative, normal mood and affect.  Labs:    Imaging Studies: No results found.  Assessment and Plan:   Eric Torres is a 88 y.o. y/o male who comes in today with dysphagia and collagenous colitis.  The patient has not been doing well with Imodium twice a day.  The patient has been told that we can start him on budesonide 9 mg every morning and supplemented with Imodium as needed.  The patient will also be set up for a repeat barium swallow to make sure he has not developed a narrowing as the cause of his change in his dysphagia symptoms.  The patient has been explained the plan agrees with it.     Lucilla Lame, MD. Marval Regal    Note: This dictation was prepared with Dragon dictation along with smaller phrase technology. Any transcriptional errors that result from this process are unintentional.

## 2022-06-21 ENCOUNTER — Telehealth: Payer: Self-pay | Admitting: Gastroenterology

## 2022-06-21 NOTE — Telephone Encounter (Signed)
Daughter called stating she was calling back to schedule procedure. Requesting call back.

## 2022-06-24 ENCOUNTER — Ambulatory Visit
Admission: RE | Admit: 2022-06-24 | Discharge: 2022-06-24 | Disposition: A | Discharge status: Home / Self Care | Payer: Medicare Other | Source: Ambulatory Visit | Attending: Gastroenterology | Admitting: Gastroenterology

## 2022-06-24 DIAGNOSIS — R1312 Dysphagia, oropharyngeal phase: Secondary | ICD-10-CM | POA: Diagnosis present

## 2022-07-27 ENCOUNTER — Telehealth: Payer: Self-pay | Admitting: *Deleted

## 2022-07-27 NOTE — Telephone Encounter (Signed)
Patient's daughter called office and left a voicemail. She is stating patient needs to be schedule for a procedure.  EDG, right?

## 2022-07-28 ENCOUNTER — Other Ambulatory Visit: Payer: Self-pay

## 2022-07-28 DIAGNOSIS — R1312 Dysphagia, oropharyngeal phase: Secondary | ICD-10-CM

## 2022-08-02 ENCOUNTER — Other Ambulatory Visit: Payer: Self-pay

## 2022-08-02 DIAGNOSIS — R1312 Dysphagia, oropharyngeal phase: Secondary | ICD-10-CM

## 2022-08-09 ENCOUNTER — Encounter
Admission: RE | Disposition: A | Discharge status: Home / Self Care | Payer: Self-pay | Source: Home / Self Care | Attending: Gastroenterology

## 2022-08-09 ENCOUNTER — Ambulatory Visit: Payer: Medicare Other

## 2022-08-09 ENCOUNTER — Encounter: Payer: Self-pay | Admitting: Gastroenterology

## 2022-08-09 ENCOUNTER — Ambulatory Visit
Admission: RE | Admit: 2022-08-09 | Discharge: 2022-08-09 | Disposition: A | Discharge status: Home / Self Care | Payer: Medicare Other | Attending: Gastroenterology | Admitting: Gastroenterology

## 2022-08-09 DIAGNOSIS — I7300 Raynaud's syndrome without gangrene: Secondary | ICD-10-CM | POA: Diagnosis not present

## 2022-08-09 DIAGNOSIS — R1310 Dysphagia, unspecified: Secondary | ICD-10-CM | POA: Diagnosis present

## 2022-08-09 DIAGNOSIS — I739 Peripheral vascular disease, unspecified: Secondary | ICD-10-CM | POA: Insufficient documentation

## 2022-08-09 DIAGNOSIS — R011 Cardiac murmur, unspecified: Secondary | ICD-10-CM | POA: Insufficient documentation

## 2022-08-09 DIAGNOSIS — R1312 Dysphagia, oropharyngeal phase: Secondary | ICD-10-CM

## 2022-08-09 DIAGNOSIS — Z8673 Personal history of transient ischemic attack (TIA), and cerebral infarction without residual deficits: Secondary | ICD-10-CM | POA: Insufficient documentation

## 2022-08-09 DIAGNOSIS — E039 Hypothyroidism, unspecified: Secondary | ICD-10-CM | POA: Insufficient documentation

## 2022-08-09 DIAGNOSIS — N400 Enlarged prostate without lower urinary tract symptoms: Secondary | ICD-10-CM | POA: Insufficient documentation

## 2022-08-09 DIAGNOSIS — I10 Essential (primary) hypertension: Secondary | ICD-10-CM | POA: Diagnosis not present

## 2022-08-09 DIAGNOSIS — K222 Esophageal obstruction: Secondary | ICD-10-CM | POA: Diagnosis not present

## 2022-08-09 DIAGNOSIS — Z87891 Personal history of nicotine dependence: Secondary | ICD-10-CM | POA: Insufficient documentation

## 2022-08-09 HISTORY — PX: ESOPHAGOGASTRODUODENOSCOPY (EGD) WITH PROPOFOL: SHX5813

## 2022-08-09 SURGERY — ESOPHAGOGASTRODUODENOSCOPY (EGD) WITH PROPOFOL
Anesthesia: General

## 2022-08-09 MED ORDER — SODIUM CHLORIDE 0.9 % IV SOLN
INTRAVENOUS | Status: DC
  Administered 2022-08-09: 1000 mL via INTRAVENOUS

## 2022-08-09 MED ORDER — PROPOFOL 10 MG/ML IV BOLUS
INTRAVENOUS | Status: DC | PRN
  Administered 2022-08-09: 50 mg via INTRAVENOUS
  Administered 2022-08-09: 20 mg via INTRAVENOUS
  Administered 2022-08-09: 30 mg via INTRAVENOUS

## 2022-08-09 MED ORDER — GLYCOPYRROLATE 0.2 MG/ML IJ SOLN
INTRAMUSCULAR | Status: DC | PRN
  Administered 2022-08-09: .2 mg via INTRAVENOUS

## 2022-08-09 MED ORDER — PROPOFOL 500 MG/50ML IV EMUL
INTRAVENOUS | Status: DC | PRN
  Administered 2022-08-09: 120 ug/kg/min via INTRAVENOUS

## 2022-08-09 NOTE — H&P (Signed)
Eric Lame, MD [address] [place], [place] [address] [phone] Fax : [phone]  Primary Care Physician:  Kirk Ruths, MD Primary Gastroenterologist:  Dr. Allen Norris  Pre-Procedure History & Physical: HPI:  Eric Torres is a 88 y.o. male is here for an endoscopy.   Past Medical History:  Diagnosis Date   Bilateral foot pain    LIKELY NEUROPATHY   BPH (benign prostatic hyperplasia)    Cancer (HCC)    ,HX OF PROSTATE CANCER AND BLADDER CANCER   Cancer (HCC)    Collagenous colitis    COPD (chronic obstructive pulmonary disease) (HCC)    MILD-NO INHALERS PER PT   Essential tremor    Frequent falls    Heart murmur    History of brachytherapy    History of TIA (transient ischemic attack) 2015   LEFT THUMB AND INDEX FINGER-SOMETIMES I DROP THINGS   Hypercholesteremia    Hypertension    Hypothyroidism    PVD (peripheral vascular disease) ([place])    Raynaud disease    Stroke ([place])    Vertigo    Vitamin D deficiency     Past Surgical History:  Procedure Laterality Date   CHOLECYSTECTOMY     CYSTOSCOPY WITH BIOPSY N/A 12/29/2015   Procedure: CYSTOSCOPY WITH BIOPSY;  Surgeon: Royston Cowper, MD;  Location: [facility] ORS;  Service: Urology;  Laterality: N/A;   DIAGNOSTIC LAPAROSCOPY     ESOPHAGOGASTRODUODENOSCOPY (EGD) WITH PROPOFOL N/A 02/15/2019   Procedure: ESOPHAGOGASTRODUODENOSCOPY (EGD) WITH PROPOFOL;  Surgeon: Lollie Sails, MD;  Location: [facility] ENDOSCOPY;  Service: Endoscopy;  Laterality: N/A;   EXPLORATORY LAPAROTOMY  1991   VOLVULUS AND BOWEL OBSTRUCTION   HERNIA REPAIR     ILIAC ARTERY STENT Bilateral    STENT PLACEMENT ILIAC ([place] HX) Bilateral 2010   TONSILLECTOMY     TRANSURETHRAL RESECTION OF BLADDER TUMOR N/A 12/29/2015   Procedure: TRANSURETHRAL RESECTION OF BLADDER TUMOR (TURBT)/MITOMYCIN INSTILLATION;  Surgeon: Royston Cowper, MD;  Location: [facility] ORS;  Service: Urology;  Laterality: N/A;   TRANSURETHRAL RESECTION OF BLADDER  TUMOR WITH GYRUS (TURBT-GYRUS)  2013   VASECTOMY      Prior to Admission medications   Medication Sig Start Date End Date Taking? Authorizing Provider  aspirin 81 MG tablet Take 81 mg by mouth daily.   Yes [provider]  budesonide (ENTOCORT EC) 3 MG 24 hr capsule Take 3 capsules (9 mg total) by mouth in the morning. 06/20/22  Yes Eric Lame, MD  finasteride (PROSCAR) 5 MG tablet Take 5 mg by mouth at bedtime.   Yes [provider]  levothyroxine (SYNTHROID, LEVOTHROID) 112 MCG tablet Take 112 mcg by mouth at bedtime.    Yes [provider]  Loperamide HCl 1 MG/7.5ML LIQD Take 7.5 mLs (1 mg total) by mouth every 4 (four) hours as needed. 03/14/22  Yes Eric Lame, MD  Nutritional Supplements (BLADDER 2.2) TABS Take 1 tablet by mouth 2 (two) times daily.    Yes [provider]  pantoprazole (PROTONIX) 40 MG tablet Take 1 tablet (40 mg total) by mouth daily. MUST SCHEDULE OFFICE VISIT 03/14/22  Yes Eric Lame, MD  primidone (MYSOLINE) 50 MG tablet Take by mouth. 02/14/22 02/14/23 Yes [provider]  propranolol (INDERAL) 20 MG tablet Take 20 mg by mouth 2 (two) times daily.    Yes [provider]  albuterol (VENTOLIN HFA) 108 (90 Base) MCG/ACT inhaler Inhale 1-2 puffs into the lungs every 6 (six) hours as needed  for wheezing or shortness of breath. Patient not taking: Reported on 08/09/2022 03/30/21   Coral Spikes, DO  atorvastatin (LIPITOR) 80 MG tablet Take 80 mg by mouth daily at 6 PM. Patient not taking: Reported on 08/09/2022    [provider]    Allergies as of 07/28/2022   (No Known Allergies)    Family History  Family history unknown: Yes    Social History   Socioeconomic History   Marital status: Married    Spouse name: Not on file   Number of children: Not on file   Years of education: Not on file   Highest education level: Not on file  Occupational History   Not on file  Tobacco Use   Smoking status:  Former    Packs/day: 0.50    Years: 35.00    Total pack years: 17.50    Types: Cigarettes    Quit date: 12/23/2011    Years since quitting: 10.6   Smokeless tobacco: Never  Vaping Use   Vaping Use: Never used  Substance and Sexual Activity   Alcohol use: Yes    Comment: OCC   Drug use: No   Sexual activity: Not on file  Other Topics Concern   Not on file  Social History Narrative   ** Merged History Encounter **       Social Determinants of Health   Financial Resource Strain: Not on file  Food Insecurity: Not on file  Transportation Needs: Not on file  Physical Activity: Not on file  Stress: Not on file  Social Connections: Not on file  Intimate Partner Violence: Not on file    Review of Systems: See HPI, otherwise negative ROS  Physical Exam: BP (!) 142/73   Pulse 64   Temp (!) 96.6 F (35.9 C) (Temporal)   Resp 18   Ht '5\' 8"'$  (1.727 m)   Wt 72.7 kg   SpO2 98%   BMI 24.38 kg/m  General:   Alert,  pleasant and cooperative in NAD Head:  Normocephalic and atraumatic. Neck:  Supple; no masses or thyromegaly. Lungs:  Clear throughout to auscultation.    Heart:  Regular rate and rhythm. Abdomen:  Soft, nontender and nondistended. Normal bowel sounds, without guarding, and without rebound.   Neurologic:  Alert and  oriented x4;  grossly normal neurologically.  Impression/Plan: Eric Torres is here for an endoscopy to be performed for dysphagia  Risks, benefits, limitations, and alternatives regarding  endoscopy have been reviewed with the patient.  Questions have been answered.  All parties agreeable.   Eric Lame, MD  08/09/2022, 9:46 AM

## 2022-08-09 NOTE — Anesthesia Preprocedure Evaluation (Addendum)
Anesthesia Evaluation  Patient identified by MRN, date of birth, ID band Patient awake    Reviewed: Allergy & Precautions, NPO status , Patient's Chart, lab work & pertinent test results  Airway Mallampati: III  TM Distance: >3 FB Neck ROM: full    Dental  (+) Chipped, Poor Dentition   Pulmonary pneumonia, resolved, COPD, former smoker,    Pulmonary exam normal        Cardiovascular hypertension, + Peripheral Vascular Disease  Normal cardiovascular exam+ Valvular Problems/Murmurs      Neuro/Psych CVA negative psych ROS   GI/Hepatic negative GI ROS, Neg liver ROS,   Endo/Other  Hypothyroidism   Renal/GU negative Renal ROS  negative genitourinary   Musculoskeletal   Abdominal   Peds  Hematology negative hematology ROS (+)   Anesthesia Other Findings Past Medical History: No date: Bilateral foot pain     Comment:  LIKELY NEUROPATHY No date: BPH (benign prostatic hyperplasia) No date: Cancer (HCC)     Comment:  ,HX OF PROSTATE CANCER AND BLADDER CANCER No date: Cancer ([place]) No date: Collagenous colitis No date: COPD (chronic obstructive pulmonary disease) (HCC)     Comment:  MILD-NO INHALERS PER PT No date: Essential tremor No date: Frequent falls No date: Heart murmur No date: History of brachytherapy 2015: History of TIA (transient ischemic attack)     Comment:  LEFT THUMB AND INDEX FINGER-SOMETIMES I DROP THINGS No date: Hypercholesteremia No date: Hypertension No date: Hypothyroidism No date: PVD (peripheral vascular disease) (HCC) No date: Raynaud disease No date: Stroke ([place]) No date: Vertigo No date: Vitamin D deficiency  Past Surgical History: No date: CHOLECYSTECTOMY 12/29/2015: CYSTOSCOPY WITH BIOPSY; N/A     Comment:  Procedure: CYSTOSCOPY WITH BIOPSY;  Surgeon: Royston Cowper, MD;  Location: [facility] ORS;  Service: Urology;                Laterality: N/A; No date: DIAGNOSTIC  LAPAROSCOPY 02/15/2019: ESOPHAGOGASTRODUODENOSCOPY (EGD) WITH PROPOFOL; N/A     Comment:  Procedure: ESOPHAGOGASTRODUODENOSCOPY (EGD) WITH               PROPOFOL;  Surgeon: Lollie Sails, MD;  Location:               [facility] ENDOSCOPY;  Service: Endoscopy;  Laterality: N/A; 1991: EXPLORATORY LAPAROTOMY     Comment:  VOLVULUS AND BOWEL OBSTRUCTION No date: HERNIA REPAIR No date: ILIAC ARTERY STENT; Bilateral 2010: [place] ([place] HX); Bilateral No date: TONSILLECTOMY 12/29/2015: TRANSURETHRAL RESECTION OF BLADDER TUMOR; N/A     Comment:  Procedure: TRANSURETHRAL RESECTION OF BLADDER TUMOR               (TURBT)/MITOMYCIN INSTILLATION;  Surgeon: Royston Cowper, MD;  Location: [facility] ORS;  Service: Urology;                Laterality: N/A; 2013: TRANSURETHRAL RESECTION OF BLADDER TUMOR WITH GYRUS (TURBT- GYRUS) No date: VASECTOMY  BMI    Body Mass Index: 24.38 kg/m      Reproductive/Obstetrics negative OB ROS                            Anesthesia Physical Anesthesia Plan  ASA: 3  Anesthesia Plan: General   Post-op Pain Management:    Induction: Intravenous  PONV  Risk Score and Plan: Propofol infusion and TIVA  Airway Management Planned: Natural Airway and Nasal Cannula  Additional Equipment:   Intra-op Plan:   Post-operative Plan:   Informed Consent: I have reviewed the patients History and Physical, chart, labs and discussed the procedure including the risks, benefits and alternatives for the proposed anesthesia with the patient or authorized representative who has indicated his/her understanding and acceptance.     Dental Advisory Given  Plan Discussed with: Anesthesiologist, CRNA and Surgeon  Anesthesia Plan Comments: (Patient consented for risks of anesthesia including but not limited to:  - adverse reactions to medications - risk of airway placement if required - damage to eyes, teeth, lips or other oral  mucosa - nerve damage due to positioning  - sore throat or hoarseness - Damage to heart, brain, nerves, lungs, other parts of body or loss of life  Patient voiced understanding.)        Anesthesia Quick Evaluation

## 2022-08-09 NOTE — Op Note (Signed)
[facility] Gastroenterology Patient Name: Eric Torres Procedure Date: 08/09/2022 10:19 AM MRN: [id number] Account #: [id number] Date of Birth: Mar 13, 1932 Admit Type: Outpatient Age: 88 Room: [facility] ENDO ROOM 4 Gender: Male Note Status: Finalized Instrument Name: Michaelle Birks [id number] Procedure:             Upper GI endoscopy Indications:           Dysphagia Providers:             Lucilla Lame MD, MD Referring MD:          Ocie Cornfield. Ouida Sills MD, MD (Referring MD) Medicines:             Propofol per Anesthesia Complications:         No immediate complications. Procedure:             Pre-Anesthesia Assessment:                        - Prior to the procedure, a History and Physical was                         performed, and patient medications and allergies were                         reviewed. The patient's tolerance of previous                         anesthesia was also reviewed. The risks and benefits                         of the procedure and the sedation options and risks                         were discussed with the patient. All questions were                         answered, and informed consent was obtained. Prior                         Anticoagulants: The patient has taken no anticoagulant                         or antiplatelet agents. ASA Grade Assessment: II - A                         patient with mild systemic disease. After reviewing                         the risks and benefits, the patient was deemed in                         satisfactory condition to undergo the procedure.                        After obtaining informed consent, the endoscope was                         passed under direct vision. Throughout the procedure,  the patient's blood pressure, pulse, and oxygen                         saturations were monitored continuously. The Endoscope                         was introduced through the mouth, and  advanced to the                         second part of duodenum. The upper GI endoscopy was                         accomplished without difficulty. The patient tolerated                         the procedure well. Findings:      One benign-appearing, intrinsic moderate stenosis was found at the       gastroesophageal junction. The stenosis was traversed. A TTS dilator was       passed through the scope. Dilation with a 12-13.5-15 mm balloon dilator       was performed to 15 mm.      The stomach was normal.      The examined duodenum was normal. Impression:            - Benign-appearing esophageal stenosis. Dilated.                        - Normal stomach.                        - Normal examined duodenum.                        - No specimens collected. Recommendation:        - Discharge patient to home.                        - Resume previous diet.                        - Continue present medications. Procedure Code(s):     --- Professional ---                        [phone], Esophagogastroduodenoscopy, flexible,                         transoral; with transendoscopic balloon dilation of                         esophagus (less than 30 mm diameter) Diagnosis Code(s):     --- Professional ---                        R13.10, Dysphagia, unspecified                        K22.2, Esophageal obstruction CPT copyright 2022 American Medical Association. All rights reserved. The codes documented in this report are preliminary and upon coder review may  be revised to meet current compliance requirements. Lucilla Lame MD, MD 08/09/2022 10:41:41 AM This report has been  signed electronically. Number of Addenda: 0 Note Initiated On: 08/09/2022 10:19 AM Estimated Blood Loss:  Estimated blood loss: none.      [facility]

## 2022-08-09 NOTE — Transfer of Care (Signed)
Immediate Anesthesia Transfer of Care Note  Patient: Eric Torres  Procedure(s) Performed: ESOPHAGOGASTRODUODENOSCOPY (EGD) WITH PROPOFOL  Patient Location: Endoscopy Unit  Anesthesia Type:General  Level of Consciousness: drowsy  Airway & Oxygen Therapy: Patient Spontanous Breathing  Post-op Assessment: Report given to RN  Post vital signs: Reviewed  Last Vitals:  Vitals Value Taken Time  BP    Temp    Pulse 68 08/09/22 1043  Resp 12 08/09/22 1043  SpO2 100 % 08/09/22 1043  Vitals shown include unvalidated device data.  Last Pain:  Vitals:   08/09/22 0923  TempSrc: Temporal  PainSc: 0-No pain         Complications: No notable events documented.

## 2022-08-09 NOTE — Anesthesia Postprocedure Evaluation (Signed)
Anesthesia Post Note  Patient: Dailyn Kempner Whisenant  Procedure(s) Performed: ESOPHAGOGASTRODUODENOSCOPY (EGD) WITH PROPOFOL  Patient location during evaluation: Endoscopy Anesthesia Type: General Level of consciousness: awake and alert Pain management: pain level controlled Vital Signs Assessment: post-procedure vital signs reviewed and stable Respiratory status: spontaneous breathing, nonlabored ventilation and respiratory function stable Cardiovascular status: blood pressure returned to baseline and stable Postop Assessment: no apparent nausea or vomiting Anesthetic complications: no   No notable events documented.   Last Vitals:  Vitals:   08/09/22 1053 08/09/22 1102  BP: (!) 157/79 (!) 163/85  Pulse: 66 69  Resp: 12 19  Temp:    SpO2: 97% 98%    Last Pain:  Vitals:   08/09/22 1102  TempSrc:   PainSc: 0-No pain                 Alphonsus Sias

## 2022-08-10 ENCOUNTER — Encounter: Payer: Self-pay | Admitting: Gastroenterology

## 2022-09-21 ENCOUNTER — Ambulatory Visit: Payer: Medicare Other | Admitting: Gastroenterology

## 2022-10-17 ENCOUNTER — Emergency Department: Payer: Medicare Other

## 2022-10-17 ENCOUNTER — Other Ambulatory Visit: Payer: Self-pay

## 2022-10-17 ENCOUNTER — Emergency Department
Admission: EM | Admit: 2022-10-17 | Discharge: 2022-10-17 | Disposition: A | Discharge status: Home / Self Care | Payer: Medicare Other | Attending: Emergency Medicine | Admitting: Emergency Medicine

## 2022-10-17 DIAGNOSIS — G20C Parkinsonism, unspecified: Secondary | ICD-10-CM | POA: Insufficient documentation

## 2022-10-17 DIAGNOSIS — S0990XA Unspecified injury of head, initial encounter: Secondary | ICD-10-CM

## 2022-10-17 DIAGNOSIS — F0390 Unspecified dementia without behavioral disturbance: Secondary | ICD-10-CM | POA: Insufficient documentation

## 2022-10-17 DIAGNOSIS — M542 Cervicalgia: Secondary | ICD-10-CM | POA: Diagnosis present

## 2022-10-17 DIAGNOSIS — S60212A Contusion of left wrist, initial encounter: Secondary | ICD-10-CM | POA: Diagnosis not present

## 2022-10-17 DIAGNOSIS — Z7982 Long term (current) use of aspirin: Secondary | ICD-10-CM | POA: Insufficient documentation

## 2022-10-17 DIAGNOSIS — S0083XA Contusion of other part of head, initial encounter: Secondary | ICD-10-CM | POA: Insufficient documentation

## 2022-10-17 DIAGNOSIS — W01198A Fall on same level from slipping, tripping and stumbling with subsequent striking against other object, initial encounter: Secondary | ICD-10-CM | POA: Diagnosis not present

## 2022-10-17 NOTE — Discharge Instructions (Addendum)
Please take Tylenol as needed for pain.  Use a walker for ambulation at all times.  Return to the ER for any severe headaches, vomiting, worsening symptoms or any urgent changes in your health

## 2022-10-17 NOTE — ED Notes (Signed)
Pt taken to care via wheel chair.

## 2022-10-17 NOTE — ED Provider Notes (Signed)
[facility] EMERGENCY DEPARTMENT Provider Note   CSN: [id number] Arrival date & time: 10/17/22  1427     History  Chief Complaint  Patient presents with   Eric Torres    Eric Torres is a 89 y.o. male.  With past medical history of dementia, Parkinson's, dizziness, presents to the emergency department for evaluation of a fall.  Patient was standing up with his walker as he normally does and fell backwards hitting his head against the wall.  No LOC nausea or vomiting.  Initially had a little bit of a small headache but currently denies any headache, vision changes neck pain nausea vomiting.  He is at his baseline.  No new symptoms.  Has had a little bit of left wrist pain from the fall.  Denies any lower extremity discomfort  HPI     Home Medications Prior to Admission medications   Medication Sig Start Date End Date Taking? Authorizing Provider  albuterol (VENTOLIN HFA) 108 (90 Base) MCG/ACT inhaler Inhale 1-2 puffs into the lungs every 6 (six) hours as needed for wheezing or shortness of breath. Patient not taking: Reported on 08/09/2022 03/30/21   Coral Spikes, DO  aspirin 81 MG tablet Take 81 mg by mouth daily.    [provider]  atorvastatin (LIPITOR) 80 MG tablet Take 80 mg by mouth daily at 6 PM. Patient not taking: Reported on 08/09/2022    [provider]  budesonide (ENTOCORT EC) 3 MG 24 hr capsule Take 3 capsules (9 mg total) by mouth in the morning. 06/20/22   Lucilla Lame, MD  finasteride (PROSCAR) 5 MG tablet Take 5 mg by mouth at bedtime.    [provider]  levothyroxine (SYNTHROID, LEVOTHROID) 112 MCG tablet Take 112 mcg by mouth at bedtime.     [provider]  Loperamide HCl 1 MG/7.5ML LIQD Take 7.5 mLs (1 mg total) by mouth every 4 (four) hours as needed. 03/14/22   Lucilla Lame, MD  Nutritional Supplements (BLADDER 2.2) TABS Take 1 tablet by mouth 2 (two) times daily.     [provider]  pantoprazole  (PROTONIX) 40 MG tablet Take 1 tablet (40 mg total) by mouth daily. MUST SCHEDULE OFFICE VISIT 03/14/22   Lucilla Lame, MD  primidone (MYSOLINE) 50 MG tablet Take by mouth. 02/14/22 02/14/23  [provider]  propranolol (INDERAL) 20 MG tablet Take 20 mg by mouth 2 (two) times daily.     [provider]      Allergies    Patient has no known allergies.    Review of Systems   Review of Systems  Physical Exam Updated Vital Signs BP (!) 118/55 (BP Location: Left Arm)   Pulse 64   Temp 97.8 F (36.6 C) (Oral)   Resp 18   SpO2 100%  Physical Exam Constitutional:      Appearance: He is well-developed.  HENT:     Head: Normocephalic and atraumatic.  Eyes:     Conjunctiva/sclera: Conjunctivae normal.  Cardiovascular:     Rate and Rhythm: Normal rate.  Pulmonary:     Effort: Pulmonary effort is normal. No respiratory distress.     Breath sounds: Normal breath sounds. No wheezing or rales.  Abdominal:     General: There is no distension.     Tenderness: There is no abdominal tenderness. There is no guarding.  Musculoskeletal:        General: Normal range of motion.     Cervical back: Normal range  of motion.     Comments: No cervical thoracic or lumbar spinous process tenderness.  Both hips move well with internal ex rotation with no discomfort.  No tenderness along the sternum, clavicles or shoulders.  Left wrist has very mild tenderness along the volar aspect of the first CMC joint.  No pain with grasping or gripping, negative CMC grind test.  Wrist is nontender to palpation.  No swelling or effusion throughout the left wrist  Skin:    General: Skin is warm.     Findings: No rash.  Neurological:     General: No focal deficit present.     Mental Status: He is alert and oriented to person, place, and time. Mental status is at baseline.     Cranial Nerves: No cranial nerve deficit.     Coordination: Coordination normal.  Psychiatric:        Behavior: Behavior normal.         Thought Content: Thought content normal.     ED Results / Procedures / Treatments   Labs (all labs ordered are listed, but only abnormal results are displayed) Labs Reviewed - No data to display  EKG None  Radiology CT HEAD WO CONTRAST (5MM)  Addendum Date: 10/17/2022   ADDENDUM REPORT: 10/17/2022 16:24 ADDENDUM: In addition, note is made of distended, thick-walled proximal esophagus containing air-fluid level. Findings are consistent with esophagitis and possible reflux or poor esophageal motility. Electronically Signed   By: Nolon Nations M.D.   On: 10/17/2022 16:24   Result Date: 10/17/2022 CLINICAL DATA:  Fall and head injury. Patient fell back kidney is had today. Patient is on blood thinners. RIGHT arm pain. EXAM: CT HEAD WITHOUT CONTRAST CT CERVICAL SPINE WITHOUT CONTRAST TECHNIQUE: Multidetector CT imaging of the head and cervical spine was performed following the standard protocol without intravenous contrast. Multiplanar CT image reconstructions of the cervical spine were also generated. RADIATION DOSE REDUCTION: This exam was performed according to the departmental dose-optimization program which includes automated exposure control, adjustment of the mA and/or kV according to patient size and/or use of iterative reconstruction technique. COMPARISON:  07/01/2021 FINDINGS: CT HEAD FINDINGS Brain: There is significant central and cortical atrophy. Periventricular white matter changes are consistent with small vessel disease. 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 There is no intra or extra-axial fluid collection or mass lesion. The basilar cisterns and ventricles have a normal appearance. There is no CT evidence for acute infarction or hemorrhage. Vascular: There is dense atherosclerotic calcification of the internal carotid arteries. No hyperdense vessels. Skull: Normal. Negative for fracture or focal lesion. Sinuses/Orbits: No acute finding. Other: There is focal  scalp edema in the RIGHT occipital region, not associated with underlying skull fracture. CT CERVICAL SPINE FINDINGS Alignment: There is reversal of normal cervical lordosis, related to degenerative changes. Skull base and vertebrae: No acute fracture. No primary bone lesion or focal pathologic process. Soft tissues and spinal canal: No prevertebral fluid or swelling. No visible canal hematoma. Disc levels: There is significant disc height loss throughout the cervical levels, most prominently at C5-6, C6-7. There is bilateral foraminal narrowing at C2-3, C3-4, RIGHT foraminal narrowing at C5-6 and LEFT foraminal narrowing at C6-7. Upper chest: Negative. Other: None IMPRESSION: 1. Atrophy and small vessel disease. 2. Remote LEFT cerebellar infarct. 3. No evidence for acute intracranial abnormality. 4. Focal scalp edema in the RIGHT occipital region, not associated with underlying skull fracture. 5. Significant degenerative changes in the cervical spine. 6. No evidence for acute  cervical spine abnormality. Electronically Signed: By: Nolon Nations M.D. On: 10/17/2022 16:19   CT Cervical Spine Wo Contrast  Addendum Date: 10/17/2022   ADDENDUM REPORT: 10/17/2022 16:24 ADDENDUM: In addition, note is made of distended, thick-walled proximal esophagus containing air-fluid level. Findings are consistent with esophagitis and possible reflux or poor esophageal motility. Electronically Signed   By: Nolon Nations M.D.   On: 10/17/2022 16:24   Result Date: 10/17/2022 CLINICAL DATA:  Fall and head injury. Patient fell back kidney is had today. Patient is on blood thinners. RIGHT arm pain. EXAM: CT HEAD WITHOUT CONTRAST CT CERVICAL SPINE WITHOUT CONTRAST TECHNIQUE: Multidetector CT imaging of the head and cervical spine was performed following the standard protocol without intravenous contrast. Multiplanar CT image reconstructions of the cervical spine were also generated. RADIATION DOSE REDUCTION: This exam was performed  according to the departmental dose-optimization program which includes automated exposure control, adjustment of the mA and/or kV according to patient size and/or use of iterative reconstruction technique. COMPARISON:  07/01/2021 FINDINGS: CT HEAD FINDINGS Brain: There is significant central and cortical atrophy. Periventricular white matter changes are consistent with small vessel disease. 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 There is no intra or extra-axial fluid collection or mass lesion. The basilar cisterns and ventricles have a normal appearance. There is no CT evidence for acute infarction or hemorrhage. Vascular: There is dense atherosclerotic calcification of the internal carotid arteries. No hyperdense vessels. Skull: Normal. Negative for fracture or focal lesion. Sinuses/Orbits: No acute finding. Other: There is focal scalp edema in the RIGHT occipital region, not associated with underlying skull fracture. CT CERVICAL SPINE FINDINGS Alignment: There is reversal of normal cervical lordosis, related to degenerative changes. Skull base and vertebrae: No acute fracture. No primary bone lesion or focal pathologic process. Soft tissues and spinal canal: No prevertebral fluid or swelling. No visible canal hematoma. Disc levels: There is significant disc height loss throughout the cervical levels, most prominently at C5-6, C6-7. There is bilateral foraminal narrowing at C2-3, C3-4, RIGHT foraminal narrowing at C5-6 and LEFT foraminal narrowing at C6-7. Upper chest: Negative. Other: None IMPRESSION: 1. Atrophy and small vessel disease. 2. Remote LEFT cerebellar infarct. 3. No evidence for acute intracranial abnormality. 4. Focal scalp edema in the RIGHT occipital region, not associated with underlying skull fracture. 5. Significant degenerative changes in the cervical spine. 6. No evidence for acute cervical spine abnormality. Electronically Signed: By: Nolon Nations M.D. On: 10/17/2022 16:19    DG Wrist Complete Left  Result Date: 10/17/2022 CLINICAL DATA:  Fall. EXAM: LEFT WRIST - COMPLETE 3+ VIEW COMPARISON:  Left wrist x-ray 01/24/2014 FINDINGS: There is no acute fracture or dislocation identified. There are moderate degenerative changes of the first carpometacarpal joint with joint space narrowing and osteophyte formation, progressed compared to prior study. Joint spaces are otherwise well maintained. Soft tissues are within normal limits. IMPRESSION: 1. No acute fracture or dislocation. Electronically Signed   By: Ronney Asters M.D.   On: 10/17/2022 15:06    Procedures Procedures    Medications Ordered in ED Medications - No data to display  ED Course/ Medical Decision Making/ A&P                           Medical Decision Making Amount and/or Complexity of Data Reviewed Radiology: ordered.   89 year old male with fall earlier today.  CT of the head and cervical spine shows no acute changes.  He  appears well, no deficits.  No complaints of pain or discomfort at this time.  No headache nausea or vomiting.  Vital signs are stable.  No bruising or bleeding on exam.  He does complain of some left wrist pain but x-rays are negative.  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  Patient stable and ready for discharge to home.  Patient will take Tylenol as needed for any pain or discomfort.  Follow-up with PCP in 1 week as needed.  Return to the ER for any worsening symptoms or any changes in health. Final Clinical Impression(s) / ED Diagnoses Final diagnoses:  Injury of head, initial encounter  Contusion of other part of head, initial encounter  Contusion of left wrist, initial encounter    Rx / DC Orders ED Discharge Orders     None         Renata Caprice 10/17/22 Urbano Heir, MD 10/17/22 2317

## 2022-10-17 NOTE — ED Triage Notes (Signed)
Pt comes with c/o fall and head injury. Pt states this happened today. Pt states he fell back hitting head. Pt is on thinner. No loc. Pt states right arm pain as well.

## 2023-01-09 ENCOUNTER — Telehealth: Payer: Self-pay

## 2023-01-09 NOTE — Telephone Encounter (Signed)
Patient daughter [name] called and left a voicemail because every time the patient eats he vomits. She states they would like to see Dr. Allen Norris. She did make a appointment online for 03/21/2023 but she does not think he needs to wait that long.

## 2023-01-09 NOTE — Telephone Encounter (Signed)
Left message on voicemail.

## 2023-01-11 NOTE — Telephone Encounter (Signed)
Left message on voicemail to schedule EGD.

## 2023-02-11 ENCOUNTER — Other Ambulatory Visit: Payer: Self-pay | Admitting: Gastroenterology

## 2023-03-11 DEATH — deceased
